# Patient Record
Sex: Female | Born: 1955 | Race: Black or African American | Hispanic: No | Smoking: Current every day smoker
Health system: Southern US, Community
[De-identification: ages and names within clinical notes are randomized; demographics above are authoritative.]

## PROBLEM LIST (undated history)

## (undated) DIAGNOSIS — I471 Supraventricular tachycardia, unspecified: Secondary | ICD-10-CM

## (undated) DIAGNOSIS — D649 Anemia, unspecified: Secondary | ICD-10-CM

## (undated) DIAGNOSIS — J9610 Chronic respiratory failure, unspecified whether with hypoxia or hypercapnia: Secondary | ICD-10-CM

## (undated) DIAGNOSIS — L0291 Cutaneous abscess, unspecified: Secondary | ICD-10-CM

## (undated) DIAGNOSIS — Z91199 Patient's noncompliance with other medical treatment and regimen due to unspecified reason: Secondary | ICD-10-CM

## (undated) DIAGNOSIS — I10 Essential (primary) hypertension: Secondary | ICD-10-CM

## (undated) DIAGNOSIS — N183 Chronic kidney disease, stage 3 unspecified: Secondary | ICD-10-CM

## (undated) DIAGNOSIS — F419 Anxiety disorder, unspecified: Secondary | ICD-10-CM

## (undated) DIAGNOSIS — E7800 Pure hypercholesterolemia, unspecified: Secondary | ICD-10-CM

## (undated) DIAGNOSIS — F32A Depression, unspecified: Secondary | ICD-10-CM

## (undated) DIAGNOSIS — K219 Gastro-esophageal reflux disease without esophagitis: Secondary | ICD-10-CM

## (undated) DIAGNOSIS — E119 Type 2 diabetes mellitus without complications: Secondary | ICD-10-CM

## (undated) DIAGNOSIS — F329 Major depressive disorder, single episode, unspecified: Secondary | ICD-10-CM

## (undated) DIAGNOSIS — Z9981 Dependence on supplemental oxygen: Secondary | ICD-10-CM

## (undated) DIAGNOSIS — Z9119 Patient's noncompliance with other medical treatment and regimen: Secondary | ICD-10-CM

---

## 2014-08-22 ENCOUNTER — Ambulatory Visit (HOSPITAL_COMMUNITY)
Admission: RE | Admit: 2014-08-22 | Discharge: 2014-08-22 | Disposition: A | Discharge status: Home / Self Care | Payer: BLUE CROSS/BLUE SHIELD | Source: Ambulatory Visit | Attending: Nurse Practitioner | Admitting: Nurse Practitioner

## 2014-08-22 ENCOUNTER — Other Ambulatory Visit (HOSPITAL_COMMUNITY): Payer: Self-pay | Admitting: Nurse Practitioner

## 2014-08-22 DIAGNOSIS — Z87891 Personal history of nicotine dependence: Secondary | ICD-10-CM | POA: Insufficient documentation

## 2014-08-22 DIAGNOSIS — R0602 Shortness of breath: Secondary | ICD-10-CM | POA: Insufficient documentation

## 2014-08-22 DIAGNOSIS — R062 Wheezing: Secondary | ICD-10-CM

## 2014-08-22 DIAGNOSIS — R05 Cough: Secondary | ICD-10-CM | POA: Insufficient documentation

## 2015-08-07 ENCOUNTER — Inpatient Hospital Stay (HOSPITAL_COMMUNITY): Payer: BLUE CROSS/BLUE SHIELD

## 2015-08-07 ENCOUNTER — Inpatient Hospital Stay (HOSPITAL_COMMUNITY)
Admission: EM | Admit: 2015-08-07 | Discharge: 2015-08-15 | DRG: 853 | Disposition: A | Discharge status: Skilled Nursing Facility | Payer: BLUE CROSS/BLUE SHIELD | Attending: Internal Medicine | Admitting: Internal Medicine

## 2015-08-07 ENCOUNTER — Emergency Department (HOSPITAL_COMMUNITY): Payer: BLUE CROSS/BLUE SHIELD

## 2015-08-07 ENCOUNTER — Encounter (HOSPITAL_COMMUNITY): Payer: Self-pay | Admitting: *Deleted

## 2015-08-07 DIAGNOSIS — I10 Essential (primary) hypertension: Secondary | ICD-10-CM | POA: Diagnosis not present

## 2015-08-07 DIAGNOSIS — E870 Hyperosmolality and hypernatremia: Secondary | ICD-10-CM | POA: Diagnosis not present

## 2015-08-07 DIAGNOSIS — M726 Necrotizing fasciitis: Secondary | ICD-10-CM

## 2015-08-07 DIAGNOSIS — D638 Anemia in other chronic diseases classified elsewhere: Secondary | ICD-10-CM | POA: Diagnosis present

## 2015-08-07 DIAGNOSIS — F17210 Nicotine dependence, cigarettes, uncomplicated: Secondary | ICD-10-CM | POA: Diagnosis not present

## 2015-08-07 DIAGNOSIS — E872 Acidosis: Secondary | ICD-10-CM | POA: Diagnosis not present

## 2015-08-07 DIAGNOSIS — J9621 Acute and chronic respiratory failure with hypoxia: Secondary | ICD-10-CM | POA: Diagnosis not present

## 2015-08-07 DIAGNOSIS — Z6841 Body Mass Index (BMI) 40.0 and over, adult: Secondary | ICD-10-CM

## 2015-08-07 DIAGNOSIS — A419 Sepsis, unspecified organism: Principal | ICD-10-CM | POA: Diagnosis present

## 2015-08-07 DIAGNOSIS — E876 Hypokalemia: Secondary | ICD-10-CM | POA: Diagnosis not present

## 2015-08-07 DIAGNOSIS — L02415 Cutaneous abscess of right lower limb: Secondary | ICD-10-CM | POA: Diagnosis present

## 2015-08-07 DIAGNOSIS — E785 Hyperlipidemia, unspecified: Secondary | ICD-10-CM | POA: Diagnosis present

## 2015-08-07 DIAGNOSIS — R34 Anuria and oliguria: Secondary | ICD-10-CM | POA: Diagnosis not present

## 2015-08-07 DIAGNOSIS — Z789 Other specified health status: Secondary | ICD-10-CM | POA: Diagnosis not present

## 2015-08-07 DIAGNOSIS — E119 Type 2 diabetes mellitus without complications: Secondary | ICD-10-CM

## 2015-08-07 DIAGNOSIS — J9602 Acute respiratory failure with hypercapnia: Secondary | ICD-10-CM | POA: Diagnosis not present

## 2015-08-07 DIAGNOSIS — I959 Hypotension, unspecified: Secondary | ICD-10-CM | POA: Diagnosis present

## 2015-08-07 DIAGNOSIS — R6520 Severe sepsis without septic shock: Secondary | ICD-10-CM | POA: Diagnosis present

## 2015-08-07 DIAGNOSIS — Z8679 Personal history of other diseases of the circulatory system: Secondary | ICD-10-CM | POA: Diagnosis not present

## 2015-08-07 DIAGNOSIS — J9601 Acute respiratory failure with hypoxia: Secondary | ICD-10-CM | POA: Diagnosis not present

## 2015-08-07 DIAGNOSIS — Z794 Long term (current) use of insulin: Secondary | ICD-10-CM | POA: Diagnosis not present

## 2015-08-07 DIAGNOSIS — N289 Disorder of kidney and ureter, unspecified: Secondary | ICD-10-CM

## 2015-08-07 DIAGNOSIS — J9600 Acute respiratory failure, unspecified whether with hypoxia or hypercapnia: Secondary | ICD-10-CM

## 2015-08-07 DIAGNOSIS — N179 Acute kidney failure, unspecified: Secondary | ICD-10-CM | POA: Diagnosis present

## 2015-08-07 DIAGNOSIS — Z01818 Encounter for other preprocedural examination: Secondary | ICD-10-CM

## 2015-08-07 DIAGNOSIS — Z7982 Long term (current) use of aspirin: Secondary | ICD-10-CM

## 2015-08-07 DIAGNOSIS — Z978 Presence of other specified devices: Secondary | ICD-10-CM

## 2015-08-07 DIAGNOSIS — IMO0001 Reserved for inherently not codable concepts without codable children: Secondary | ICD-10-CM

## 2015-08-07 DIAGNOSIS — J9622 Acute and chronic respiratory failure with hypercapnia: Secondary | ICD-10-CM | POA: Diagnosis not present

## 2015-08-07 HISTORY — DX: Essential (primary) hypertension: I10

## 2015-08-07 LAB — CBC WITH DIFFERENTIAL/PLATELET
BASOS ABS: 0 10*3/uL (ref 0.0–0.1)
Basophils Relative: 0 %
EOS ABS: 0 10*3/uL (ref 0.0–0.7)
EOS PCT: 0 %
HCT: 36.7 % (ref 36.0–46.0)
Hemoglobin: 12.9 g/dL (ref 12.0–15.0)
Lymphocytes Relative: 9 %
Lymphs Abs: 1.4 10*3/uL (ref 0.7–4.0)
MCH: 33.5 pg (ref 26.0–34.0)
MCHC: 35.1 g/dL (ref 30.0–36.0)
MCV: 95.3 fL (ref 78.0–100.0)
Monocytes Absolute: 0.8 10*3/uL (ref 0.1–1.0)
Monocytes Relative: 5 %
NEUTROS PCT: 86 %
Neutro Abs: 13.6 10*3/uL — ABNORMAL HIGH (ref 1.7–7.7)
PLATELETS: 246 10*3/uL (ref 150–400)
RBC: 3.85 MIL/uL — ABNORMAL LOW (ref 3.87–5.11)
RDW: 13.9 % (ref 11.5–15.5)
WBC: 15.8 10*3/uL — ABNORMAL HIGH (ref 4.0–10.5)

## 2015-08-07 LAB — BASIC METABOLIC PANEL
Anion gap: 15 (ref 5–15)
BUN: 39 mg/dL — AB (ref 6–20)
CO2: 28 mmol/L (ref 22–32)
CREATININE: 1.66 mg/dL — AB (ref 0.44–1.00)
Calcium: 9.4 mg/dL (ref 8.9–10.3)
Chloride: 91 mmol/L — ABNORMAL LOW (ref 101–111)
GFR calc Af Amer: 38 mL/min — ABNORMAL LOW (ref >60)
GFR, EST NON AFRICAN AMERICAN: 33 mL/min — AB (ref >60)
Glucose, Bld: 335 mg/dL — ABNORMAL HIGH (ref 65–99)
POTASSIUM: 3.2 mmol/L — AB (ref 3.5–5.1)
SODIUM: 134 mmol/L — AB (ref 135–145)

## 2015-08-07 MED ORDER — IOHEXOL 300 MG/ML  SOLN
80.0000 mL | Freq: Once | INTRAMUSCULAR | Status: AC | PRN
  Administered 2015-08-07: 80 mL via INTRAVENOUS

## 2015-08-07 MED ORDER — PIPERACILLIN-TAZOBACTAM 3.375 G IVPB 30 MIN
3.3750 g | Freq: Once | INTRAVENOUS | Status: AC
  Administered 2015-08-07: 3.375 g via INTRAVENOUS
  Filled 2015-08-07: qty 50

## 2015-08-07 MED ORDER — LIDOCAINE-EPINEPHRINE (PF) 1 %-1:200000 IJ SOLN
10.0000 mL | Freq: Once | INTRAMUSCULAR | Status: AC
  Administered 2015-08-07: 10 mL via INTRADERMAL
  Filled 2015-08-07: qty 10

## 2015-08-07 MED ORDER — SODIUM CHLORIDE 0.9 % IV BOLUS (SEPSIS)
1000.0000 mL | Freq: Once | INTRAVENOUS | Status: AC
  Administered 2015-08-07: 1000 mL via INTRAVENOUS

## 2015-08-07 MED ORDER — OXYCODONE-ACETAMINOPHEN 5-325 MG PO TABS
1.0000 | ORAL_TABLET | Freq: Once | ORAL | Status: AC
  Administered 2015-08-07: 1 via ORAL
  Filled 2015-08-07: qty 1

## 2015-08-07 MED ORDER — CLINDAMYCIN PHOSPHATE 600 MG/50ML IV SOLN
600.0000 mg | Freq: Once | INTRAVENOUS | Status: AC
  Administered 2015-08-07: 600 mg via INTRAVENOUS
  Filled 2015-08-07: qty 50

## 2015-08-07 MED ORDER — VANCOMYCIN HCL IN DEXTROSE 1-5 GM/200ML-% IV SOLN
1000.0000 mg | Freq: Once | INTRAVENOUS | Status: AC
  Administered 2015-08-07: 1000 mg via INTRAVENOUS
  Filled 2015-08-07: qty 200

## 2015-08-07 NOTE — ED Notes (Addendum)
Pt states she began having an abscess last Tuesday, she talked with her PCP on Friday and he wrote a prescription of antibiotics.   Pt has large amount of swelling to her right inner thigh.

## 2015-08-07 NOTE — H&P (Signed)
[facility] History and Physical  [name] ZOX:[id number] DOB: 03-Dec-1955 DOA: 08/07/2015  Referring physician: Dr Clayborne Dana PCP: [name], MD   Chief Complaint: Swelling and pain inner thigh no R  HPI: [name] is a 60 y.o. female with 20 yr hx of IDDM, HTN, depression, obesity who presented to ED today with 6 day hx of nausea, fevers and lightheadedness.  She has hx of "boils" in and around her private area in the past.  Now she has a lot of swelling and pain about the inner thigh on the R.  She has had temps up to 102 most every day x 6 days, not able to eat much, lightheaded w standing.  No abd pain, CP, no diarrhea.     In the ED WBC 15.8, temp 98.9, BP 99/45 and SaO2 95% on 2L [place].  Patient found to have large amount swelling R upper / medial thigh.  Pelvis CT w contrast showed "aggressive infection/ cellulitis involving R medial upper thigh and groin area w extensive gas in the SQ fat....likely necrotizing fasciitis. "  Plan is for transfer to [facility].  Seen by gen surgery here but this patient is beyond the scope of their practice here.   Lives with family, raising grandchildren.  Smokes 2 cigarettes per day, no etoh.  No hx heart problems, MI, afib, no hx CVA or diabetic complications (no neurop/ retinop).    ROS  denies CP  no joint pain   no HA  no blurry vision  no rash  no diarrhea  no dysuria  no difficulty voiding  no change in urine color    Where does patient live home Can patient participate in ADLs? yes  Past Medical History  Past Medical History  Diagnosis Date  . Diabetes mellitus without complication (HCC)   . Hypertension    Past Surgical History History reviewed. No pertinent past surgical history. Family History No family history on file. Social History  reports that she has been smoking Cigarettes.  She does not have any smokeless tobacco history on file. She reports that she drinks alcohol. She reports that she does not use  illicit drugs. Allergies  Allergies  Allergen Reactions  . Penicillins Rash    Has patient had a PCN reaction causing immediate rash, facial/tongue/throat swelling, SOB or lightheadedness with hypotension: Yes Has patient had a PCN reaction causing severe rash involving mucus membranes or skin necrosis: No Has patient had a PCN reaction that required hospitalization No Has patient had a PCN reaction occurring within the last 10 years: No If all of the above answers are "NO", then may proceed with Cephalosporin use.    Home medications Prior to Admission medications   Medication Sig Start Date End Date Taking? Authorizing Provider  acetaminophen (TYLENOL) 500 MG tablet Take 500 mg by mouth every 6 (six) hours as needed for mild pain or moderate pain.   Yes Historical Provider, MD  aspirin EC 81 MG tablet Take 81 mg by mouth daily.   Yes Historical Provider, MD  doxycycline (VIBRA-TABS) 100 MG tablet Take 100 mg by mouth 2 (two) times daily. 10 day course starting on 08/04/2015 08/04/15  Yes Historical Provider, MD  FARXIGA 10 MG TABS tablet Take 10 mg by mouth daily. 07/23/15  Yes Historical Provider, MD  FLUoxetine (PROZAC) 20 MG capsule Take 20 mg by mouth 2 (two) times daily.  05/11/15  Yes Historical Provider, MD  lisinopril-hydrochlorothiazide (PRINZIDE,ZESTORETIC) 20-25 MG tablet Take 1 tablet by  mouth daily. 05/11/15  Yes Historical Provider, MD  metFORMIN (GLUCOPHAGE) 1000 MG tablet Take 1,000 mg by mouth 2 (two) times daily. 07/19/15  Yes Historical Provider, MD  metoprolol (LOPRESSOR) 50 MG tablet Take 50 mg by mouth 2 (two) times daily. 05/11/15  Yes Historical Provider, MD  NIFEdipine (PROCARDIA-XL/ADALAT CC) 30 MG 24 hr tablet Take 30 mg by mouth daily. 05/11/15  Yes Historical Provider, MD  NOVOLIN 70/30 RELION (70-30) 100 UNIT/ML injection Inject 30 Units into the skin every morning.  07/23/15  Yes Historical Provider, MD  pravastatin (PRAVACHOL) 40 MG tablet Take 40 mg by mouth daily.  05/11/15  Yes Historical Provider, MD   Liver Function Tests No results for input(s): AST, ALT, ALKPHOS, BILITOT, PROT, ALBUMIN in the last 168 hours. No results for input(s): LIPASE, AMYLASE in the last 168 hours. CBC  Recent Labs Lab 08/07/15 1247  WBC 15.8*  NEUTROABS 13.6*  HGB 12.9  HCT 36.7  MCV 95.3  PLT 246   Basic Metabolic Panel  Recent Labs Lab 08/07/15 1247  NA 134*  K 3.2*  CL 91*  CO2 28  GLUCOSE 335*  BUN 39*  CREATININE 1.66*  CALCIUM 9.4     Filed Vitals:   08/07/15 1800 08/07/15 1815 08/07/15 1830 08/07/15 2000  BP:   99/45 108/43  Pulse: 101 97 97 99  Temp:   98.9 F (37.2 C)   TempSrc:   Oral   Resp: Height:      Weight:      SpO2: 85% 94% 93% 95%   Exam: Alert, pleasant adult female, no distress No rash, cyanosis or gangrene Sclera anicteric, throat clear No jvd Chest clear bilat RRR no MRG Abd obese ntnd no mass or ascite no hsm +BS GU medial R thigh very swollen, skin is erythematous, very tender, draining and very foul odor No LE edema Neuro is alert, nf, Ox 3  EKG (independently reviewed) > pending CXR (independently reviewed) > pending  Home medications > asa, doxy, Farxiga, lisinopril/ HCTZ, metformin, metoprolol, nifedipine, 70/30 insulin 15 u bid, pravastatin  Assessment: 1. Necrotizing fasciitis R thigh- extensive gas in SQ fat by CT, no drainable abscess.  Getting IV abx x 3 here (clinda/vanc/ zosyn) then plan transfer to [facility] for further evaluation.  Get EKG/ CXR preop, starting IVF's.  Hold all po meds and BP meds, BP is soft.   2. HTN by history  3. Hypotension - prob d/t infection  4. DM2 - will use lantus 10 bid and q 4hr SSI for now 5. Acute renal insuff - no old labs here, may be from hypoperfusion.  Starting IVF's.    Plan - as above   DVT Prophylaxis SCD's  Code Status: full  Family Communication: none here  Disposition Plan: transfer to [facility] Pager [phone]  Cell [phone]  If 7PM-7AM, please contact night-coverage [email or web address] Password [facility] 08/07/2015, 8:51 PM

## 2015-08-07 NOTE — Consult Note (Signed)
Patient seen, chart reviewed. Patient is a 60 year old black female with history of diabetes mellitus, glucose 335 at presentation, and morbid obesity who presents with worsening right groin pain and swelling. This is in the right upper inner thigh. Patient does have a leukocytosis and her blood pressure is low but stable. CT scan reveals findings consistent with necrotizing fasciitis. There is no abscess drainable. This patient needs to be transferred to a tertiary care center for further evaluation and treatment as this is outside our realm of expertise and care. Patient understands this and agrees to the transfer.

## 2015-08-07 NOTE — ED Provider Notes (Signed)
CSN: [id number]     Arrival date & time 08/07/15  1207 History   First MD Initiated Contact with Patient 08/07/15 1557     Chief Complaint  Patient presents with  . Abscess     (Consider location/radiation/quality/duration/timing/severity/associated sxs/prior Treatment) Patient is a 60 y.o. female presenting with leg pain.  Leg Pain Injury: no   Pain details:    Quality:  Aching and sharp   Radiates to:  Does not radiate   Severity:  Mild   Onset quality:  Gradual   Duration:  1 week   Timing:  Constant   Progression:  Worsening (progressive worsening on antibiotics, last 24-36 hours it has severely worsened and become more painful and swollen) Chronicity:  New Prior injury to area:  No Ineffective treatments: antibiotics, NSAIDs. Associated symptoms: fatigue and fever     60 year old obese hypertensive diabetic female here who had a week of cellulitis in her thigh however last 24 hours has gotten slightly more painful and swollen.  Past Medical History  Diagnosis Date  . Diabetes mellitus without complication (HCC)   . Hypertension    History reviewed. No pertinent past surgical history. No family history on file. Social History  Substance Use Topics  . Smoking status: Current Some Day Smoker    Types: Cigarettes  . Smokeless tobacco: None  . Alcohol Use: Yes     Comment: occ.   OB History    No data available     Review of Systems  Constitutional: Positive for fever, activity change and fatigue.  Gastrointestinal: Positive for nausea and vomiting.  Skin: Positive for rash.  All other systems reviewed and are negative.     Allergies  Review of patient's allergies indicates no known allergies.  Home Medications   Prior to Admission medications   Not on File   BP 110/54 mmHg  Pulse 104  Temp(Src) 98.8 F (37.1 C) (Oral)  Resp 18  Ht 5\' 3"  (1.6 m)  Wt 240 lb (108.863 kg)  BMI 42.52 kg/m2  SpO2 97% Physical Exam  Constitutional: She appears  well-developed and well-nourished.  HENT:  Head: Normocephalic and atraumatic.  Neck: Normal range of motion.  Cardiovascular: Normal rate and regular rhythm.   Pulmonary/Chest: No stridor. No respiratory distress.  Abdominal: She exhibits no distension.  Neurological: She is alert.  Skin: Skin is warm and dry. Rash (erythematous, slightly indurated, tender with some associated crepitus) noted.  Nursing note and vitals reviewed.   ED Course  .Marland KitchenIncision and Drainage Date/Time: 08/09/2015 11:34 AM Performed by: Marily Memos Authorized by: Marily Memos Consent: Verbal consent obtained. Risks and benefits: risks, benefits and alternatives were discussed Consent given by: patient Patient understanding: patient states understanding of the procedure being performed Patient consent: the patient's understanding of the procedure matches consent given Patient identity confirmed: verbally with patient Time out: Immediately prior to procedure a "time out" was called to verify the correct patient, procedure, equipment, support staff and site/side marked as required. Type: abscess Body area: lower extremity (right thigh) Anesthesia: local infiltration Local anesthetic: lidocaine 1% with epinephrine Anesthetic total: 5 ml Risk factor: underlying major vessel and  underlying major nerve Scalpel size: 11 Needle gauge: 18 Incision type: single straight Incision depth: dermal Complexity: simple Drainage: serosanguinous Drainage amount: scant Patient tolerance: Patient tolerated the procedure well with no immediate complications Comments: Initially aspirated with needle and small amount of purulent appearing fluid was retrieved so proceeded to I&D with scalpel. Minimal fluid retrieved, but very malodrous,  grayish yellowish appearance. Cultures taken.    CRITICAL CARE Performed by: Marily Memos   Total critical care time: 45 minutes Critical care time was exclusive of separately billable  procedures and treating other patients. Critical care was necessary to treat or prevent imminent or life-threatening deterioration. Critical care was time spent personally by me on the following activities: development of treatment plan with patient and/or surrogate as well as nursing, discussions with consultants, evaluation of patient's response to treatment, examination of patient, obtaining history from patient or surrogate, ordering and performing treatments and interventions, ordering and review of laboratory studies, ordering and review of radiographic studies, pulse oximetry and re-evaluation of patient's condition.  EMERGENCY DEPARTMENT US SOFT TISSUE INTERPRETATION "Study: Limited Soft Tissue Ultrasound"  INDICATIONS: Pain and Soft tissue infection Multiple views of the body part were obtained in real-time with a multi-frequency linear probe PERFORMED BY:  Myself IMAGES ARCHIVED?: Yes SIDE:Right  BODY PART:Lower extremity FINDINGS: Other small fluid collections with cellulitis INTERPRETATION:  Possible abscess with significant loculations  CPT: Lower extremity 76880-26  Labs Review Labs Reviewed  CBC WITH DIFFERENTIAL/PLATELET - Abnormal; Notable for the following:    WBC 15.8 (*)    RBC 3.85 (*)    Neutro Abs 13.6 (*)    All other components within normal limits  BASIC METABOLIC PANEL - Abnormal; Notable for the following:    Sodium 134 (*)    Potassium 3.2 (*)    Chloride 91 (*)    Glucose, Bld 335 (*)    BUN 39 (*)    Creatinine, Ser 1.66 (*)    GFR calc non Af Amer 33 (*)    GFR calc Af Amer 38 (*)    All other components within normal limits  URINALYSIS, ROUTINE W REFLEX MICROSCOPIC (NOT AT [facility]) - Abnormal; Notable for the following:    Glucose, UA >1000 (*)    Hgb urine dipstick TRACE (*)    Ketones, ur 15 (*)    All other components within normal limits  HEPATIC FUNCTION PANEL - Abnormal; Notable for the following:    Albumin 2.2 (*)    ALT 12 (*)    All  other components within normal limits  COMPREHENSIVE METABOLIC PANEL - Abnormal; Notable for the following:    Potassium 2.9 (*)    Chloride 96 (*)    Glucose, Bld 288 (*)    BUN 42 (*)    Creatinine, Ser 1.71 (*)    Albumin 2.1 (*)    AST 14 (*)    ALT 12 (*)    GFR calc non Af Amer 31 (*)    GFR calc Af Amer 36 (*)    Anion gap 17 (*)    All other components within normal limits  CBC - Abnormal; Notable for the following:    WBC 15.5 (*)    RBC 3.40 (*)    Hemoglobin 11.2 (*)    HCT 32.2 (*)    All other components within normal limits  GLUCOSE, CAPILLARY - Abnormal; Notable for the following:    Glucose-Capillary 282 (*)    All other components within normal limits  GLUCOSE, CAPILLARY - Abnormal; Notable for the following:    Glucose-Capillary 263 (*)    All other components within normal limits  URINE MICROSCOPIC-ADD ON - Abnormal; Notable for the following:    Squamous Epithelial / LPF 6-30 (*)    Bacteria, UA FEW (*)    All other components within normal limits  GLUCOSE, CAPILLARY -  Abnormal; Notable for the following:    Glucose-Capillary 211 (*)    All other components within normal limits  CBC - Abnormal; Notable for the following:    WBC 13.3 (*)    RBC 3.38 (*)    Hemoglobin 11.2 (*)    HCT 31.9 (*)    All other components within normal limits  BASIC METABOLIC PANEL - Abnormal; Notable for the following:    Potassium 2.8 (*)    Chloride 99 (*)    Glucose, Bld 195 (*)    BUN 42 (*)    Creatinine, Ser 1.57 (*)    GFR calc non Af Amer 35 (*)    GFR calc Af Amer 40 (*)    All other components within normal limits  MAGNESIUM - Abnormal; Notable for the following:    Magnesium 2.5 (*)    All other components within normal limits  PROTIME-INR - Abnormal; Notable for the following:    Prothrombin Time 15.8 (*)    All other components within normal limits  HEMOGLOBIN A1C - Abnormal; Notable for the following:    Hgb A1c MFr Bld 9.8 (*)    All other components  within normal limits  LIPID PANEL - Abnormal; Notable for the following:    Triglycerides 210 (*)    HDL 11 (*)    VLDL 42 (*)    All other components within normal limits  BASIC METABOLIC PANEL - Abnormal; Notable for the following:    Potassium 3.1 (*)    Glucose, Bld 174 (*)    BUN 40 (*)    Creatinine, Ser 1.41 (*)    Calcium 8.5 (*)    GFR calc non Af Amer 40 (*)    GFR calc Af Amer 46 (*)    All other components within normal limits  GLUCOSE, CAPILLARY - Abnormal; Notable for the following:    Glucose-Capillary 162 (*)    All other components within normal limits  GLUCOSE, CAPILLARY - Abnormal; Notable for the following:    Glucose-Capillary 167 (*)    All other components within normal limits  TRIGLYCERIDES - Abnormal; Notable for the following:    Triglycerides 232 (*)    All other components within normal limits  BASIC METABOLIC PANEL - Abnormal; Notable for the following:    Potassium 3.3 (*)    Chloride 100 (*)    Glucose, Bld 219 (*)    BUN 42 (*)    Creatinine, Ser 1.54 (*)    Calcium 8.5 (*)    GFR calc non Af Amer 36 (*)    GFR calc Af Amer 41 (*)    Anion gap 16 (*)    All other components within normal limits  CBC - Abnormal; Notable for the following:    WBC 13.0 (*)    RBC 3.05 (*)    Hemoglobin 9.6 (*)    HCT 28.8 (*)    All other components within normal limits  BASIC METABOLIC PANEL - Abnormal; Notable for the following:    Potassium 3.2 (*)    Glucose, Bld 114 (*)    BUN 47 (*)    Creatinine, Ser 1.56 (*)    Calcium 8.3 (*)    GFR calc non Af Amer 35 (*)    GFR calc Af Amer 41 (*)    All other components within normal limits  BLOOD GAS, ARTERIAL - Abnormal; Notable for the following:    pH, Arterial 7.299 (*)    pCO2 arterial 55.5 (*)  pO2, Arterial 111 (*)    Bicarbonate 26.4 (*)    All other components within normal limits  GLUCOSE, CAPILLARY - Abnormal; Notable for the following:    Glucose-Capillary 194 (*)    All other components  within normal limits  GLUCOSE, CAPILLARY - Abnormal; Notable for the following:    Glucose-Capillary 119 (*)    All other components within normal limits  GLUCOSE, CAPILLARY - Abnormal; Notable for the following:    Glucose-Capillary 102 (*)    All other components within normal limits  TRIGLYCERIDES - Abnormal; Notable for the following:    Triglycerides 351 (*)    All other components within normal limits  GLUCOSE, CAPILLARY - Abnormal; Notable for the following:    Glucose-Capillary 108 (*)    All other components within normal limits  POCT I-STAT 4, (NA,K, GLUC, HGB,HCT) - Abnormal; Notable for the following:    Potassium 2.6 (*)    Glucose, Bld 176 (*)    HCT 34.0 (*)    Hemoglobin 11.6 (*)    All other components within normal limits  POCT I-STAT 3, ART BLOOD GAS (G3+) - Abnormal; Notable for the following:    pH, Arterial 7.291 (*)    pCO2 arterial 56.0 (*)    pO2, Arterial 66.0 (*)    Bicarbonate 27.2 (*)    All other components within normal limits  CULTURE, ROUTINE-ABSCESS  MRSA PCR SCREENING  CULTURE, ROUTINE-ABSCESS  ANAEROBIC CULTURE  MAGNESIUM  APTT  PROCALCITONIN    Imaging Review Ct Pelvis W Contrast  08/07/2015  CLINICAL DATA:  Pain, swelling and redness involving the right thigh and groin area. EXAM: CT PELVIS WITH CONTRAST TECHNIQUE: Multidetector CT imaging of the pelvis was performed using the standard protocol following the bolus administration of intravenous contrast. CONTRAST:  80mL OMNIPAQUE IOHEXOL 300 MG/ML  SOLN COMPARISON:  None. FINDINGS: There is a fairly extensive infectious process involving the right medial upper thigh and groin area extending along anterior aspect of the right pelvis. There is an extensive amount of gas in the soft tissues, inflammation/edema/fluid is skin thickening. Findings are worrisome for necrotizing fasciitis-cellulitis. I do not see a discrete drainable abscess. There is marked inflammation involving the deep muscular  fascia but I do not see any obvious myofasciitis or pyomyositis. No findings for septic arthritis or osteomyelitis. No significant intra pelvic abnormalities. Uterine fibroids are noted. Bladder is normal. There are borderline enlarged/ inflamed inguinal lymph nodes, right greater than left. Moderate atherosclerotic calcifications involving the aorta, iliac and common femoral arteries. No findings for deep venous thrombosis in the upper thighs were groin. There is an anterior abdominal wall hernia containing fat and part of the transverse colon. IMPRESSION: Aggressive appearing infection/cellulitis involving right medial upper thigh and groin area with extensive gas in the subcutaneous fat. This is likely necrotizing fasciitis/cellulitis. No discrete drainable abscess or findings for deep compartment involvement. No findings for septic arthritis or osteomyelitis. Electronically Signed   By: Rudie Meyer M.D.   On: 08/07/2015 17:56   Dg Chest Port 1 View  08/08/2015  CLINICAL DATA:  Acute respiratory failure. Endotracheal tube placement. EXAM: PORTABLE CHEST 1 VIEW COMPARISON:  08/06/2010 FINDINGS: A new endotracheal tube is seen with tip approximately 2 cm above the carina. A new nasogastric tube is seen with tip in the stomach. Improved aeration of both lungs is demonstrated. There is no evidence of pulmonary consolidation, pleural effusion, or pneumothorax. IMPRESSION: Endotracheal tube and nasogastric tube in appropriate position. Improved aeration of both  lungs. No evidence of consolidation or effusion. Electronically Signed   By: Myles Rosenthal M.D.   On: 08/08/2015 14:31   Dg Chest Port 1 View  08/07/2015  CLINICAL DATA:  60 year old female with abscess on the leg. Preop evaluation EXAM: PORTABLE CHEST 1 VIEW COMPARISON:  Radiograph dated 08/22/2014 FINDINGS: Single portable view of the chest demonstrate hypovolemic lungs. There is no focal consolidation, pleural effusion, or pneumothorax. Top-normal  cardiac silhouette. No acute osseous pathology. IMPRESSION: No acute cardiopulmonary process. Electronically Signed   By: Elgie Collard M.D.   On: 08/07/2015 21:27   I have personally reviewed and evaluated these images and lab results as part of my medical decision-making.   EKG Interpretation None      MDM   Final diagnoses:  Necrotizing fasciitis (HCC) of inner thigh  Acute renal insufficiency   Attempted I&D, very foul odor and no purulent drainage. Swabs taken. Concern for nec fasc. Started vanc/zosyn/clinda, CT ordered. Will speak with general surgeon. Spoke with Dr. Lovell Sheehan who feels she has the potential to be very sick and doesn't feel that [facility] is the appropriate place or have the appropriate personnel (i.e. Critical care, multiple surgeons) to take care of her. Patients' VS stable, still slightly tachy, MS ok.  Paged Dr. Lindie Spruce at [facility] who felt that Dr. Lovell Sheehan should evaluate the patient before refusing to let her stay here, repaged Dr. Lovell Sheehan.  Dr. Lovell Sheehan evaluated patient and still feels that she should be transferred. Patient still stable, BP slightly lower, another bolus ordered.  Repaged Dr. Lindie Spruce at [facility] who now agrees patient can be [facility] service. Medicine paged.  [facility] evaluated promptly and agreed with admission and transfer. Still stable.      Marily Memos, MD 08/09/15 1140

## 2015-08-08 ENCOUNTER — Encounter (HOSPITAL_COMMUNITY)
Admission: EM | Disposition: A | Discharge status: Skilled Nursing Facility | Payer: Self-pay | Source: Home / Self Care | Attending: Internal Medicine

## 2015-08-08 ENCOUNTER — Inpatient Hospital Stay (HOSPITAL_COMMUNITY): Payer: BLUE CROSS/BLUE SHIELD

## 2015-08-08 ENCOUNTER — Inpatient Hospital Stay (HOSPITAL_COMMUNITY): Payer: BLUE CROSS/BLUE SHIELD | Admitting: Anesthesiology

## 2015-08-08 DIAGNOSIS — Z978 Presence of other specified devices: Secondary | ICD-10-CM | POA: Insufficient documentation

## 2015-08-08 DIAGNOSIS — M726 Necrotizing fasciitis: Secondary | ICD-10-CM

## 2015-08-08 DIAGNOSIS — N289 Disorder of kidney and ureter, unspecified: Secondary | ICD-10-CM

## 2015-08-08 DIAGNOSIS — Z789 Other specified health status: Secondary | ICD-10-CM

## 2015-08-08 HISTORY — PX: INCISION AND DRAINAGE ABSCESS: SHX5864

## 2015-08-08 LAB — GLUCOSE, CAPILLARY
GLUCOSE-CAPILLARY: 211 mg/dL — AB (ref 65–99)
GLUCOSE-CAPILLARY: 162 mg/dL — AB (ref 65–99)
GLUCOSE-CAPILLARY: 194 mg/dL — AB (ref 65–99)
Glucose-Capillary: 167 mg/dL — ABNORMAL HIGH (ref 65–99)
Glucose-Capillary: 263 mg/dL — ABNORMAL HIGH (ref 65–99)
Glucose-Capillary: 282 mg/dL — ABNORMAL HIGH (ref 65–99)

## 2015-08-08 LAB — URINALYSIS, ROUTINE W REFLEX MICROSCOPIC
Bilirubin Urine: NEGATIVE
KETONES UR: 15 mg/dL — AB
LEUKOCYTES UA: NEGATIVE
Nitrite: NEGATIVE
PROTEIN: NEGATIVE mg/dL
Specific Gravity, Urine: 1.03 (ref 1.005–1.030)
pH: 5 (ref 5.0–8.0)

## 2015-08-08 LAB — COMPREHENSIVE METABOLIC PANEL
ALT: 12 U/L — ABNORMAL LOW (ref 14–54)
AST: 14 U/L — AB (ref 15–41)
Albumin: 2.1 g/dL — ABNORMAL LOW (ref 3.5–5.0)
Alkaline Phosphatase: 106 U/L (ref 38–126)
Anion gap: 17 — ABNORMAL HIGH (ref 5–15)
BUN: 42 mg/dL — AB (ref 6–20)
CHLORIDE: 96 mmol/L — AB (ref 101–111)
CO2: 26 mmol/L (ref 22–32)
Calcium: 9.1 mg/dL (ref 8.9–10.3)
Creatinine, Ser: 1.71 mg/dL — ABNORMAL HIGH (ref 0.44–1.00)
GFR calc Af Amer: 36 mL/min — ABNORMAL LOW (ref >60)
GFR, EST NON AFRICAN AMERICAN: 31 mL/min — AB (ref >60)
Glucose, Bld: 288 mg/dL — ABNORMAL HIGH (ref 65–99)
POTASSIUM: 2.9 mmol/L — AB (ref 3.5–5.1)
Sodium: 139 mmol/L (ref 135–145)
Total Bilirubin: 0.9 mg/dL (ref 0.3–1.2)
Total Protein: 6.6 g/dL (ref 6.5–8.1)

## 2015-08-08 LAB — BASIC METABOLIC PANEL
ANION GAP: 13 (ref 5–15)
ANION GAP: 16 — AB (ref 5–15)
Anion gap: 13 (ref 5–15)
BUN: 42 mg/dL — ABNORMAL HIGH (ref 6–20)
BUN: 40 mg/dL — AB (ref 6–20)
BUN: 42 mg/dL — ABNORMAL HIGH (ref 6–20)
CALCIUM: 9 mg/dL (ref 8.9–10.3)
CALCIUM: 8.5 mg/dL — AB (ref 8.9–10.3)
CO2: 28 mmol/L (ref 22–32)
CO2: 26 mmol/L (ref 22–32)
CO2: 25 mmol/L (ref 22–32)
CREATININE: 1.57 mg/dL — AB (ref 0.44–1.00)
Calcium: 8.5 mg/dL — ABNORMAL LOW (ref 8.9–10.3)
Chloride: 99 mmol/L — ABNORMAL LOW (ref 101–111)
Chloride: 102 mmol/L (ref 101–111)
Chloride: 100 mmol/L — ABNORMAL LOW (ref 101–111)
Creatinine, Ser: 1.41 mg/dL — ABNORMAL HIGH (ref 0.44–1.00)
Creatinine, Ser: 1.54 mg/dL — ABNORMAL HIGH (ref 0.44–1.00)
GFR calc Af Amer: 46 mL/min — ABNORMAL LOW (ref >60)
GFR, EST AFRICAN AMERICAN: 40 mL/min — AB (ref >60)
GFR, EST AFRICAN AMERICAN: 41 mL/min — AB (ref >60)
GFR, EST NON AFRICAN AMERICAN: 35 mL/min — AB (ref >60)
GFR, EST NON AFRICAN AMERICAN: 40 mL/min — AB (ref >60)
GFR, EST NON AFRICAN AMERICAN: 36 mL/min — AB (ref >60)
GLUCOSE: 219 mg/dL — AB (ref 65–99)
Glucose, Bld: 195 mg/dL — ABNORMAL HIGH (ref 65–99)
Glucose, Bld: 174 mg/dL — ABNORMAL HIGH (ref 65–99)
POTASSIUM: 3.1 mmol/L — AB (ref 3.5–5.1)
POTASSIUM: 3.3 mmol/L — AB (ref 3.5–5.1)
Potassium: 2.8 mmol/L — ABNORMAL LOW (ref 3.5–5.1)
SODIUM: 140 mmol/L (ref 135–145)
SODIUM: 141 mmol/L (ref 135–145)
Sodium: 141 mmol/L (ref 135–145)

## 2015-08-08 LAB — CBC
HCT: 31.9 % — ABNORMAL LOW (ref 36.0–46.0)
HEMATOCRIT: 32.2 % — AB (ref 36.0–46.0)
Hemoglobin: 11.2 g/dL — ABNORMAL LOW (ref 12.0–15.0)
Hemoglobin: 11.2 g/dL — ABNORMAL LOW (ref 12.0–15.0)
MCH: 32.9 pg (ref 26.0–34.0)
MCH: 33.1 pg (ref 26.0–34.0)
MCHC: 34.8 g/dL (ref 30.0–36.0)
MCHC: 35.1 g/dL (ref 30.0–36.0)
MCV: 94.7 fL (ref 78.0–100.0)
MCV: 94.4 fL (ref 78.0–100.0)
PLATELETS: 244 10*3/uL (ref 150–400)
Platelets: 242 10*3/uL (ref 150–400)
RBC: 3.4 MIL/uL — AB (ref 3.87–5.11)
RBC: 3.38 MIL/uL — ABNORMAL LOW (ref 3.87–5.11)
RDW: 14.2 % (ref 11.5–15.5)
RDW: 14.1 % (ref 11.5–15.5)
WBC: 15.5 10*3/uL — AB (ref 4.0–10.5)
WBC: 13.3 10*3/uL — ABNORMAL HIGH (ref 4.0–10.5)

## 2015-08-08 LAB — LIPID PANEL
Cholesterol: 127 mg/dL (ref 0–200)
HDL: 11 mg/dL — ABNORMAL LOW (ref >40)
LDL CALC: 74 mg/dL (ref 0–99)
TRIGLYCERIDES: 210 mg/dL — AB (ref <150)
Total CHOL/HDL Ratio: 11.5 RATIO
VLDL: 42 mg/dL — AB (ref 0–40)

## 2015-08-08 LAB — PROTIME-INR
INR: 1.24 (ref 0.00–1.49)
PROTHROMBIN TIME: 15.8 s — AB (ref 11.6–15.2)

## 2015-08-08 LAB — HEPATIC FUNCTION PANEL
ALK PHOS: 126 U/L (ref 38–126)
ALT: 12 U/L — ABNORMAL LOW (ref 14–54)
AST: 15 U/L (ref 15–41)
Albumin: 2.2 g/dL — ABNORMAL LOW (ref 3.5–5.0)
BILIRUBIN INDIRECT: 0.5 mg/dL (ref 0.3–0.9)
Bilirubin, Direct: 0.2 mg/dL (ref 0.1–0.5)
TOTAL PROTEIN: 6.6 g/dL (ref 6.5–8.1)
Total Bilirubin: 0.7 mg/dL (ref 0.3–1.2)

## 2015-08-08 LAB — POCT I-STAT 3, ART BLOOD GAS (G3+)
BICARBONATE: 27.2 meq/L — AB (ref 20.0–24.0)
O2 Saturation: 91 %
PCO2 ART: 56 mmHg — AB (ref 35.0–45.0)
PH ART: 7.291 — AB (ref 7.350–7.450)
PO2 ART: 66 mmHg — AB (ref 80.0–100.0)
Patient temperature: 96.9
TCO2: 29 mmol/L (ref 0–100)

## 2015-08-08 LAB — POCT I-STAT 4, (NA,K, GLUC, HGB,HCT)
Glucose, Bld: 176 mg/dL — ABNORMAL HIGH (ref 65–99)
HEMATOCRIT: 34 % — AB (ref 36.0–46.0)
HEMOGLOBIN: 11.6 g/dL — AB (ref 12.0–15.0)
POTASSIUM: 2.6 mmol/L — AB (ref 3.5–5.1)
Sodium: 140 mmol/L (ref 135–145)

## 2015-08-08 LAB — TRIGLYCERIDES: TRIGLYCERIDES: 232 mg/dL — AB (ref <150)

## 2015-08-08 LAB — MAGNESIUM
MAGNESIUM: 2.4 mg/dL (ref 1.7–2.4)
MAGNESIUM: 2.5 mg/dL — AB (ref 1.7–2.4)

## 2015-08-08 LAB — APTT: aPTT: 30 seconds (ref 24–37)

## 2015-08-08 LAB — URINE MICROSCOPIC-ADD ON

## 2015-08-08 LAB — MRSA PCR SCREENING: MRSA BY PCR: NEGATIVE

## 2015-08-08 SURGERY — INCISION AND DRAINAGE, ABSCESS
Anesthesia: General | Laterality: Right

## 2015-08-08 MED ORDER — FENTANYL CITRATE (PF) 2500 MCG/50ML IJ SOLN
25.0000 ug/h | INTRAMUSCULAR | Status: DC
  Administered 2015-08-08 – 2015-08-10 (×4): 200 ug/h via INTRAVENOUS
  Filled 2015-08-08 (×4): qty 50

## 2015-08-08 MED ORDER — ANTISEPTIC ORAL RINSE SOLUTION (CORINZ)
7.0000 mL | Freq: Four times a day (QID) | OROMUCOSAL | Status: DC
  Administered 2015-08-09 – 2015-08-11 (×9): 7 mL via OROMUCOSAL

## 2015-08-08 MED ORDER — ONDANSETRON HCL 4 MG/2ML IJ SOLN
4.0000 mg | Freq: Four times a day (QID) | INTRAMUSCULAR | Status: DC | PRN
  Administered 2015-08-13 – 2015-08-14 (×3): 4 mg via INTRAVENOUS
  Filled 2015-08-08 (×3): qty 2

## 2015-08-08 MED ORDER — PROPOFOL 10 MG/ML IV BOLUS
INTRAVENOUS | Status: DC | PRN
  Administered 2015-08-08: 120 mg via INTRAVENOUS

## 2015-08-08 MED ORDER — PIPERACILLIN-TAZOBACTAM 3.375 G IVPB 30 MIN
3.3750 g | Freq: Once | INTRAVENOUS | Status: AC
  Administered 2015-08-08: 3.375 g via INTRAVENOUS
  Filled 2015-08-08: qty 50

## 2015-08-08 MED ORDER — SODIUM CHLORIDE 0.45 % IV SOLN
INTRAVENOUS | Status: DC
  Administered 2015-08-08: 01:00:00 via INTRAVENOUS

## 2015-08-08 MED ORDER — FENTANYL CITRATE (PF) 100 MCG/2ML IJ SOLN
INTRAMUSCULAR | Status: DC | PRN
  Administered 2015-08-08 (×2): 50 ug via INTRAVENOUS
  Administered 2015-08-08: 100 ug via INTRAVENOUS
  Administered 2015-08-08: 50 ug via INTRAVENOUS

## 2015-08-08 MED ORDER — FENTANYL CITRATE (PF) 100 MCG/2ML IJ SOLN: 50.0000 ug | Freq: Once | INTRAMUSCULAR | Status: DC

## 2015-08-08 MED ORDER — PHENYLEPHRINE HCL 10 MG/ML IJ SOLN
INTRAMUSCULAR | Status: DC | PRN
  Administered 2015-08-08 (×5): 80 ug via INTRAVENOUS

## 2015-08-08 MED ORDER — CHLORHEXIDINE GLUCONATE 0.12% ORAL RINSE (MEDLINE KIT)
15.0000 mL | Freq: Two times a day (BID) | OROMUCOSAL | Status: DC
  Administered 2015-08-08 – 2015-08-10 (×5): 15 mL via OROMUCOSAL

## 2015-08-08 MED ORDER — INSULIN ASPART 100 UNIT/ML ~~LOC~~ SOLN
0.0000 [IU] | SUBCUTANEOUS | Status: DC
  Administered 2015-08-08: 4 [IU] via SUBCUTANEOUS
  Administered 2015-08-08: 7 [IU] via SUBCUTANEOUS
  Administered 2015-08-09 – 2015-08-11 (×7): 3 [IU] via SUBCUTANEOUS

## 2015-08-08 MED ORDER — INSULIN GLARGINE 100 UNIT/ML ~~LOC~~ SOLN
10.0000 [IU] | Freq: Two times a day (BID) | SUBCUTANEOUS | Status: DC
  Administered 2015-08-08 – 2015-08-12 (×6): 10 [IU] via SUBCUTANEOUS
  Filled 2015-08-08 (×12): qty 0.1

## 2015-08-08 MED ORDER — HYDROMORPHONE HCL 1 MG/ML IJ SOLN: 0.5000 mg | INTRAMUSCULAR | Status: DC | PRN

## 2015-08-08 MED ORDER — ACETAMINOPHEN 650 MG RE SUPP: 650.0000 mg | Freq: Four times a day (QID) | RECTAL | Status: DC | PRN

## 2015-08-08 MED ORDER — ACETAMINOPHEN 325 MG PO TABS: 650.0000 mg | ORAL_TABLET | Freq: Four times a day (QID) | ORAL | Status: DC | PRN

## 2015-08-08 MED ORDER — PIPERACILLIN-TAZOBACTAM 3.375 G IVPB
3.3750 g | Freq: Four times a day (QID) | INTRAVENOUS | Status: DC
Start: 2015-08-08 — End: 2015-08-08

## 2015-08-08 MED ORDER — PIPERACILLIN-TAZOBACTAM 3.375 G IVPB
3.3750 g | Freq: Three times a day (TID) | INTRAVENOUS | Status: DC
  Administered 2015-08-08 – 2015-08-13 (×15): 3.375 g via INTRAVENOUS
  Filled 2015-08-08 (×19): qty 50

## 2015-08-08 MED ORDER — LACTATED RINGERS IV SOLN
INTRAVENOUS | Status: DC | PRN
  Administered 2015-08-08: 12:00:00 via INTRAVENOUS

## 2015-08-08 MED ORDER — MIDAZOLAM HCL 5 MG/5ML IJ SOLN
INTRAMUSCULAR | Status: DC | PRN
  Administered 2015-08-08: 2 mg via INTRAVENOUS

## 2015-08-08 MED ORDER — MIDAZOLAM HCL 2 MG/2ML IJ SOLN: 2.0000 mg | INTRAMUSCULAR | Status: DC | PRN

## 2015-08-08 MED ORDER — PROPOFOL 10 MG/ML IV BOLUS
62.5000 ug/kg | Freq: Once | INTRAVENOUS | Status: DC
  Filled 2015-08-08: qty 20

## 2015-08-08 MED ORDER — FENTANYL CITRATE (PF) 250 MCG/5ML IJ SOLN
INTRAMUSCULAR | Status: AC
  Filled 2015-08-08: qty 5

## 2015-08-08 MED ORDER — MIDAZOLAM HCL 2 MG/2ML IJ SOLN: 1.0000 mg | INTRAMUSCULAR | Status: DC | PRN

## 2015-08-08 MED ORDER — INSULIN ASPART 100 UNIT/ML ~~LOC~~ SOLN
0.0000 [IU] | SUBCUTANEOUS | Status: DC
  Administered 2015-08-08 (×2): 5 [IU] via SUBCUTANEOUS
  Administered 2015-08-08: 3 [IU] via SUBCUTANEOUS

## 2015-08-08 MED ORDER — LIDOCAINE HCL (CARDIAC) 20 MG/ML IV SOLN
INTRAVENOUS | Status: DC | PRN
  Administered 2015-08-08: 50 mg via INTRAVENOUS

## 2015-08-08 MED ORDER — SODIUM CHLORIDE 0.9 % IV SOLN
INTRAVENOUS | Status: DC | PRN
  Administered 2015-08-08: 10:00:00 via INTRAVENOUS

## 2015-08-08 MED ORDER — PROPOFOL 10 MG/ML IV BOLUS
INTRAVENOUS | Status: AC
  Filled 2015-08-08: qty 20

## 2015-08-08 MED ORDER — PROPOFOL 500 MG/50ML IV EMUL
INTRAVENOUS | Status: DC | PRN
  Administered 2015-08-08: 50 ug/kg/min via INTRAVENOUS

## 2015-08-08 MED ORDER — POTASSIUM CHLORIDE 10 MEQ/100ML IV SOLN
10.0000 meq | INTRAVENOUS | Status: AC
  Administered 2015-08-08 (×3): 10 meq via INTRAVENOUS
  Filled 2015-08-08 (×2): qty 100

## 2015-08-08 MED ORDER — VANCOMYCIN HCL 10 G IV SOLR
1250.0000 mg | INTRAVENOUS | Status: DC
  Administered 2015-08-08 – 2015-08-12 (×5): 1250 mg via INTRAVENOUS
  Filled 2015-08-08 (×6): qty 1250

## 2015-08-08 MED ORDER — POTASSIUM CHLORIDE IN NACL 20-0.9 MEQ/L-% IV SOLN
INTRAVENOUS | Status: DC
  Administered 2015-08-08 – 2015-08-11 (×6): via INTRAVENOUS
  Filled 2015-08-08 (×11): qty 1000

## 2015-08-08 MED ORDER — MIDAZOLAM HCL 2 MG/2ML IJ SOLN
INTRAMUSCULAR | Status: AC
  Filled 2015-08-08: qty 2

## 2015-08-08 MED ORDER — HYDROMORPHONE HCL 1 MG/ML IJ SOLN: 0.2500 mg | INTRAMUSCULAR | Status: DC | PRN

## 2015-08-08 MED ORDER — CLINDAMYCIN PHOSPHATE 600 MG/50ML IV SOLN
600.0000 mg | Freq: Three times a day (TID) | INTRAVENOUS | Status: DC
  Administered 2015-08-08 – 2015-08-13 (×16): 600 mg via INTRAVENOUS
  Filled 2015-08-08 (×20): qty 50

## 2015-08-08 MED ORDER — FENTANYL BOLUS VIA INFUSION
50.0000 ug | INTRAVENOUS | Status: DC | PRN
  Administered 2015-08-10: 50 ug via INTRAVENOUS
  Filled 2015-08-08: qty 50

## 2015-08-08 MED ORDER — SODIUM CHLORIDE 0.9 % IV BOLUS (SEPSIS)
500.0000 mL | Freq: Once | INTRAVENOUS | Status: AC
  Administered 2015-08-08: 500 mL via INTRAVENOUS

## 2015-08-08 MED ORDER — PROPOFOL 1000 MG/100ML IV EMUL
5.0000 ug/kg/min | INTRAVENOUS | Status: DC
  Filled 2015-08-08: qty 100

## 2015-08-08 MED ORDER — ONDANSETRON HCL 4 MG PO TABS: 4.0000 mg | ORAL_TABLET | Freq: Four times a day (QID) | ORAL | Status: DC | PRN

## 2015-08-08 MED ORDER — ROCURONIUM BROMIDE 100 MG/10ML IV SOLN
INTRAVENOUS | Status: DC | PRN
  Administered 2015-08-08: 30 mg via INTRAVENOUS
  Administered 2015-08-08: 50 mg via INTRAVENOUS

## 2015-08-08 MED ORDER — SUCCINYLCHOLINE CHLORIDE 20 MG/ML IJ SOLN
INTRAMUSCULAR | Status: DC | PRN
  Administered 2015-08-08: 100 mg via INTRAVENOUS

## 2015-08-08 MED ORDER — SODIUM CHLORIDE 0.9 % IR SOLN
Status: DC | PRN
  Administered 2015-08-08: 1000 mL

## 2015-08-08 MED ORDER — PANTOPRAZOLE SODIUM 40 MG IV SOLR
40.0000 mg | INTRAVENOUS | Status: DC
  Administered 2015-08-08 – 2015-08-10 (×3): 40 mg via INTRAVENOUS
  Filled 2015-08-08 (×3): qty 40

## 2015-08-08 SURGICAL SUPPLY — 33 items
BNDG GAUZE ELAST 4 BULKY (GAUZE/BANDAGES/DRESSINGS) ×6 IMPLANT
CANISTER SUCTION 2500CC (MISCELLANEOUS) ×3 IMPLANT
COVER SURGICAL LIGHT HANDLE (MISCELLANEOUS) ×3 IMPLANT
DRAPE LAPAROSCOPIC ABDOMINAL (DRAPES) IMPLANT
DRSG PAD ABDOMINAL 8X10 ST (GAUZE/BANDAGES/DRESSINGS) ×12 IMPLANT
ELECT CAUTERY BLADE 6.4 (BLADE) ×3 IMPLANT
ELECT REM PT RETURN 9FT ADLT (ELECTROSURGICAL) ×3
ELECTRODE REM PT RTRN 9FT ADLT (ELECTROSURGICAL) ×1 IMPLANT
GAUZE SPONGE 4X4 12PLY STRL (GAUZE/BANDAGES/DRESSINGS) ×3 IMPLANT
GLOVE BIO SURGEON STRL SZ7.5 (GLOVE) ×9 IMPLANT
GLOVE BIOGEL PI IND STRL 8 (GLOVE) ×2 IMPLANT
GLOVE BIOGEL PI INDICATOR 8 (GLOVE) ×4
GOWN STRL REUS W/ TWL LRG LVL3 (GOWN DISPOSABLE) ×1 IMPLANT
GOWN STRL REUS W/TWL LRG LVL3 (GOWN DISPOSABLE) ×2
HANDPIECE INTERPULSE COAX TIP (DISPOSABLE) ×2
KIT BASIN OR (CUSTOM PROCEDURE TRAY) ×3 IMPLANT
KIT ROOM TURNOVER OR (KITS) ×3 IMPLANT
LEGGING LITHOTOMY PAIR STRL (DRAPES) ×3 IMPLANT
NS IRRIG 1000ML POUR BTL (IV SOLUTION) ×3 IMPLANT
PACK GENERAL/GYN (CUSTOM PROCEDURE TRAY) ×3 IMPLANT
PAD ARMBOARD 7.5X6 YLW CONV (MISCELLANEOUS) ×3 IMPLANT
SET HNDPC FAN SPRY TIP SCT (DISPOSABLE) ×1 IMPLANT
SPONGE LAP 18X18 X RAY DECT (DISPOSABLE) ×6 IMPLANT
SUT VIC AB 3-0 SH 18 (SUTURE) ×3 IMPLANT
SUT VIC AB 3-0 SH 27 (SUTURE) ×2
SUT VIC AB 3-0 SH 27XBRD (SUTURE) ×1 IMPLANT
SWAB COLLECTION DEVICE MRSA (MISCELLANEOUS) IMPLANT
TOWEL OR 17X24 6PK STRL BLUE (TOWEL DISPOSABLE) ×3 IMPLANT
TOWEL OR 17X26 10 PK STRL BLUE (TOWEL DISPOSABLE) ×3 IMPLANT
TUBE ANAEROBIC SPECIMEN COL (MISCELLANEOUS) IMPLANT
TUBE CONNECTING 12'X1/4 (SUCTIONS) ×1
TUBE CONNECTING 12X1/4 (SUCTIONS) ×2 IMPLANT
YANKAUER SUCT BULB TIP NO VENT (SUCTIONS) ×3 IMPLANT

## 2015-08-08 NOTE — Consult Note (Signed)
PULMONARY / CRITICAL CARE MEDICINE   Name: [name] MRN: [id number] DOB: Oct 28, 1955    ADMISSION DATE:  08/07/2015 CONSULTATION DATE:  2/28  REFERRING MD:  Joseph Art   CHIEF COMPLAINT:  Post-op vent management and critical care   HISTORY OF PRESENT ILLNESS:   60 y.o. female with 20 yr hx of IDDM, HTN, depression, obesity who presented to ED 2/27 with 6 day hx of nausea, fevers and lightheadedness.On arrival reported swelling and pain about the inner thigh on the R. She has had temps up to 102 most every day x 6 days, not able to eat much, lightheaded w standing. No abd pain, CP, no diarrhea.In the ED WBC 15.8, temp 98.9, BP 99/45 and SaO2 95% on 2L [place]. Patient found to have large amount swelling R upper / medial thigh. Pelvis CT w contrast showed "aggressive infection/ cellulitis involving R medial upper thigh and groin area w extensive gas in the SQ fat....likely necrotizing fasciitis. "[facility], empiric abx were started and she then went to the OR on 2/28 for debridement. She returned to the PACU intubated w/ plans for return   PAST MEDICAL HISTORY :  She  has a past medical history of Diabetes mellitus without complication (HCC) and Hypertension.  PAST SURGICAL HISTORY: She  has no past surgical history on file.  Allergies  Allergen Reactions  . Penicillins Rash    Has patient had a PCN reaction causing immediate rash, facial/tongue/throat swelling, SOB or lightheadedness with hypotension: Yes Has patient had a PCN reaction causing severe rash involving mucus membranes or skin necrosis: No Has patient had a PCN reaction that required hospitalization No Has patient had a PCN reaction occurring within the last 10 years: No If all of the above answers are "NO", then may proceed with Cephalosporin use.     No current facility-administered medications on file prior to encounter.   No current outpatient prescriptions on file prior to encounter.    FAMILY  HISTORY:  Her has no family status information on file.   SOCIAL HISTORY: She  reports that she has been smoking Cigarettes.  She does not have any smokeless tobacco history on file. She reports that she drinks alcohol. She reports that she does not use illicit drugs.  REVIEW OF SYSTEMS:   Unable   SUBJECTIVE:  Sedated on vent   VITAL SIGNS: BP 130/71 mmHg  Pulse 98  Temp(Src) 98.8 F (37.1 C) (Oral)  Resp 29  Ht 5\' 3"  (1.6 m)  Wt 235 lb 3.7 oz (106.7 kg)  BMI 41.68 kg/m2  SpO2 98%  HEMODYNAMICS:    VENTILATOR SETTINGS: Vent Mode:  [-] PRVC FiO2 (%):  [60 %-100 %] 60 % Set Rate:  [16 bmp] 16 bmp Vt Set:  [420 mL] 420 mL Plateau Pressure:  [20 cmH20] 20 cmH20  INTAKE / OUTPUT: I/O last 3 completed shifts: In: 906.7 [I.V.:556.7; IV Piggyback:350] Out: 300 [Urine:300]  PHYSICAL EXAMINATION: General:  Chronically ill appearing 60 year old aaf; now sedated on Vent  Neuro:  Sedated, grimaces to pain  HEENT:  Neck large; orally intubated  Cardiovascular:  Rrr, no MRG Lungs:  Scattered rhonchi, equal chest rise.  Abdomen:  Soft, not tender, right groin/leg dressing intact  Musculoskeletal:  Equal st and bulk  Skin:  Warm, right leg dressing intact   LABS:  BMET  Recent Labs Lab 08/07/15 1247 08/08/15 0317 08/08/15 0908 08/08/15 1137  NA 134* 139 140 140  K 3.2* 2.9* 2.8* 2.6*  CL 91* 96* 99*  --   CO2 --   BUN 39* 42* 42*  --   CREATININE 1.66* 1.71* 1.57*  --   GLUCOSE 335* 288* 195* 176*    Electrolytes  Recent Labs Lab 08/07/15 1247 08/08/15 0317 08/08/15 0908  CALCIUM 9.4 9.1 9.0  MG  --  2.4 2.5*    CBC  Recent Labs Lab 08/07/15 1247 08/08/15 0317 08/08/15 0908 08/08/15 1137  WBC 15.8* 15.5* 13.3*  --   HGB 12.9 11.2* 11.2* 11.6*  HCT 36.7 32.2* 31.9* 34.0*  PLT 246 242 244  --     Coag's  Recent Labs Lab 08/08/15 0908  APTT 30  INR 1.24    Sepsis Markers No results for input(s): LATICACIDVEN, PROCALCITON,  O2SATVEN in the last 168 hours.  ABG No results for input(s): PHART, PCO2ART, PO2ART in the last 168 hours.  Liver Enzymes  Recent Labs Lab 08/08/15 0317  AST 15  14*  ALT 12*  12*  ALKPHOS 126  106  BILITOT 0.7  0.9  ALBUMIN 2.2*  2.1*    Cardiac Enzymes No results for input(s): TROPONINI, PROBNP in the last 168 hours.  Glucose  Recent Labs Lab 08/08/15 0047 08/08/15 0419 08/08/15 0750 08/08/15 1040 08/08/15 1340  GLUCAP 282* 263* 211* 167* 162*    Imaging Ct Pelvis W Contrast  08/07/2015  CLINICAL DATA:  Pain, swelling and redness involving the right thigh and groin area. EXAM: CT PELVIS WITH CONTRAST TECHNIQUE: Multidetector CT imaging of the pelvis was performed using the standard protocol following the bolus administration of intravenous contrast. CONTRAST:  80mL OMNIPAQUE IOHEXOL 300 MG/ML  SOLN COMPARISON:  None. FINDINGS: There is a fairly extensive infectious process involving the right medial upper thigh and groin area extending along anterior aspect of the right pelvis. There is an extensive amount of gas in the soft tissues, inflammation/edema/fluid is skin thickening. Findings are worrisome for necrotizing fasciitis-cellulitis. I do not see a discrete drainable abscess. There is marked inflammation involving the deep muscular fascia but I do not see any obvious myofasciitis or pyomyositis. No findings for septic arthritis or osteomyelitis. No significant intra pelvic abnormalities. Uterine fibroids are noted. Bladder is normal. There are borderline enlarged/ inflamed inguinal lymph nodes, right greater than left. Moderate atherosclerotic calcifications involving the aorta, iliac and common femoral arteries. No findings for deep venous thrombosis in the upper thighs were groin. There is an anterior abdominal wall hernia containing fat and part of the transverse colon. IMPRESSION: Aggressive appearing infection/cellulitis involving right medial upper thigh and groin  area with extensive gas in the subcutaneous fat. This is likely necrotizing fasciitis/cellulitis. No discrete drainable abscess or findings for deep compartment involvement. No findings for septic arthritis or osteomyelitis. Electronically Signed   By: Rudie Meyer M.D.   On: 08/07/2015 17:56   Dg Chest Port 1 View  08/08/2015  CLINICAL DATA:  Acute respiratory failure. Endotracheal tube placement. EXAM: PORTABLE CHEST 1 VIEW COMPARISON:  08/06/2010 FINDINGS: A new endotracheal tube is seen with tip approximately 2 cm above the carina. A new nasogastric tube is seen with tip in the stomach. Improved aeration of both lungs is demonstrated. There is no evidence of pulmonary consolidation, pleural effusion, or pneumothorax. IMPRESSION: Endotracheal tube and nasogastric tube in appropriate position. Improved aeration of both lungs. No evidence of consolidation or effusion. Electronically Signed   By: Myles Rosenthal M.D.   On: 08/08/2015 14:31   Dg Chest [facility]  08/07/2015  CLINICAL DATA:  60 year old female with abscess on the leg. Preop evaluation EXAM: PORTABLE CHEST 1 VIEW COMPARISON:  Radiograph dated 08/22/2014 FINDINGS: Single portable view of the chest demonstrate hypovolemic lungs. There is no focal consolidation, pleural effusion, or pneumothorax. Top-normal cardiac silhouette. No acute osseous pathology. IMPRESSION: No acute cardiopulmonary process. Electronically Signed   By: Elgie Collard M.D.   On: 08/07/2015 21:27     STUDIES:  CT abd/pelvis 2/27:Aggressive appearing infection/cellulitis involving right medial upper thigh and groin area with extensive gas in the subcutaneous fat. This is likely necrotizing fasciitis/cellulitis. No discrete drainable abscess or findings for deep compartment involvement.  CULTURES: ABSCESS 2/28: mod gpc>>>  ANTIBIOTICS: vanc 2/27>>> Zosyn 2/27>>> clinda 2/27>>>  SIGNIFICANT EVENTS: OR for I&D right leg 2/28  LINES/TUBES: OETT  2/28>>>  DISCUSSION: S/p I&D for right nec fasc. Will cont full vent support given plan for return OR visits. On approp abx, will narrow when culture date available. Cont supportive care .  ASSESSMENT / PLAN:  PULMONARY A: Ventilator dependence after I&D.  Right basilar atx P:   Cont full vent support PAD protocol  F/u cxr and abg as needed   CARDIOVASCULAR A:  Severe sepsis 2/2 nec fasc  Sedation related hypotension  P:  Tele Cont IVFs Will order PICC as will need prolonged IV access; will see how she does w/ transition to fent gtt from diprivan.   RENAL A:   AKI -->improving  Hypokalemia  P:   Avoid hypotension Cont IVFs Renal dose meds  Replaced K; recheck   GASTROINTESTINAL A:   No acute  P:   PPI For SUP  HEMATOLOGIC A:   Anemia of chronic disease.  P:  F/u cbc Transfuse per ICU protocol  Hold anticoagulation x 24hrs per surgical request  SCDs immediate post op   INFECTIOUS A:   Necrotizing fasciitis of the RLE/groin. Now s/p I&D of RLE.  P:   Wound care per surgical team -->for return to OR 3/1? Cont vanc/zosyn and clinda (see above) F/u culture data (see above)  ENDOCRINE A:   IDDM  W/ hyperglycemia  P:   ssi protocol   NEUROLOGIC A:   Sedated  Post-op pain At risk for acute encephalopathy  P:   RASS goal: -2 PAD protocol  Supportive care    FAMILY  - Updates: pending   - Inter-disciplinary family meet or Palliative Care meeting due by: 3/6  Simonne Martinet ACNP-BC [facility] Pulmonary/Critical Care Pager # [phone] OR # [phone] if no answer   08/08/2015, 2:52 PM   STAFF NOTE: I, Rory Percy, MD FACP have personally reviewed patient's available data, including medical history, events of note, physical examination and test results as part of my evaluation. I have discussed with resident/NP and other care providers such as pharmacist, RN and RRT. In addition, I personally evaluated patient and elicited key findings of:  sedated post op, lungs slight coarse, wound dressed dry, clean, heart sounds wnl, perrl 2 mm, post op resp failure secondary to [employer], maintain clinda for now for toxin inhibition howevere if remains off pressors 24 hr would dc this, change sedation to fent, dc dilauded, avoid benzo as able, back to or in am , no extubation planned, for now keep same MV, obtain abg now, pcxr reviewed, et wnl, hint fluid fissure rt, keep even, may need line, rass score -2 goal, ppi ensure, ssi, d/w surgery The patient is critically ill with multiple organ systems failure and requires high  complexity decision making for assessment and support, frequent evaluation and titration of therapies, application of advanced monitoring technologies and extensive interpretation of multiple databases.   Critical Care Time devoted to patient care services described in this note is30 Minutes. This time reflects time of care of this signee: Rory Percy, MD FACP. This critical care time does not reflect procedure time, or teaching time or supervisory time of PA/NP/Med student/Med Resident etc but could involve care discussion time. Rest per NP/medical resident whose note is outlined above and that I agree with   Mcarthur Rossetti. Tyson Alias, MD, FACP Pgr: [phone] [facility] 08/08/2015 3:48 PM

## 2015-08-08 NOTE — Progress Notes (Signed)
[facility] TEAM 1 - Stepdown/ICU TEAM Progress Note  [name]:[id number] DOB: 1956/05/30 DOA: 08/07/2015 PCP: [name], MD  Admit HPI / Brief Narrative: 60 y.o. BF PMHx 20 yr Hx IDDM, HTN, Depression, Obesity   Presented to ED today with 6 day hx of nausea, fevers and lightheadedness. She has hx of "boils" in and around her private area in the past. Now she has a lot of swelling and pain about the inner thigh on the R. She has had temps up to 102 most every day x 6 days, not able to eat much, lightheaded w standing. No abd pain, CP, no diarrhea.   In the ED WBC 15.8, temp 98.9, BP 99/45 and SaO2 95% on 2L [place]. Patient found to have large amount swelling R upper / medial thigh. Pelvis CT w contrast showed "aggressive infection/ cellulitis involving R medial upper thigh and groin area w extensive gas in the SQ fat....likely necrotizing fasciitis. " Plan is for transfer to [facility]. Seen by gen surgery here but this patient is beyond the scope of their practice here.   Lives with family, raising grandchildren. Smokes 2 cigarettes per day, no etoh. No hx heart problems, MI, afib, no hx CVA or diabetic complications (no neurop/ retinop).    HPI/Subjective: 2/28 I did not evaluate this patient today secondary to patient being in surgery all day. Attempted on 2 occasions to see patient unsuccessfully    Assessment/Plan: Necrotizing fasciitis Rt thigh - extensive gas in SQ fat by CT, no drainable abscess. Getting IV abx x 3 here (clinda/vanc/ zosyn) then plan transfer to [facility] for further evaluation. Get EKG/ CXR preop, starting IVF's. Hold all po meds and BP meds, BP is soft. -Patient for surgery today   HTN -Hold all BP medications patient currently hypotensive  Hypotension  - prob d/t infection  -Continue 0.45% saline at 190ml/hr  DM Type 2  - Continue Lantus 10 units BID -Increase to resistant SSI  -Hemoglobin A1c pending -Lipid panel  pending  Acute renal insuff  - no old labs here, may be from hypoperfusion. Starting IVF's  Hypokalemia -Potassium IV 50 mEq   Code Status: FULL Family Communication: no family present at time of exam Disposition Plan: Per surgery    Consultants: Dr.Daniel Daneil Dan PC CM Dr.Armando Derrell Lolling CCS   Procedure/Significant Events:    Culture   Antibiotics: Clindamycin 2/28>> Zosyn 2/27>> Vancomycin 2/27>>    DVT prophylaxis: SCD   Devices NA   LINES / TUBES:      Continuous Infusions: . sodium chloride 100 mL/hr at 08/08/15 0126    Objective: VITAL SIGNS: Temp: 98 F (36.7 C) (02/28 0754) Temp Source: Oral (02/28 0754) BP: 113/64 mmHg (02/28 0754) Pulse Rate: 99 (02/28 0754) SPO2; FIO2:   Intake/Output Summary (Last 24 hours) at 08/08/15 0837 Last data filed at 08/08/15 0600  Gross per 24 hour  Intake 906.67 ml  Output    300 ml  Net 606.67 ml     Exam:  Patient in surgery all day unavailable for exam    Data Reviewed: Basic Metabolic Panel:  Recent Labs Lab 08/07/15 1247 08/08/15 0317  NA 134* 139  K 3.2* 2.9*  CL 91* 96*  CO2 28 26  GLUCOSE 335* 288*  BUN 39* 42*  CREATININE 1.66* 1.71*  CALCIUM 9.4 9.1  MG  --  2.4   Liver Function Tests:  Recent Labs Lab 08/08/15 0317  AST 15  14*  ALT 12*  12*  ALKPHOS 126  106  BILITOT 0.7  0.9  PROT 6.6  6.6  ALBUMIN 2.2*  2.1*   No results for input(s): LIPASE, AMYLASE in the last 168 hours. No results for input(s): AMMONIA in the last 168 hours. CBC:  Recent Labs Lab 08/07/15 1247 08/08/15 0317  WBC 15.8* 15.5*  NEUTROABS 13.6*  --   HGB 12.9 11.2*  HCT 36.7 32.2*  MCV 95.3 94.7  PLT 246 242   Cardiac Enzymes: No results for input(s): CKTOTAL, CKMB, CKMBINDEX, TROPONINI in the last 168 hours. BNP (last 3 results) No results for input(s): BNP in the last 8760 hours.  ProBNP (last 3 results) No results for input(s): PROBNP in the last 8760  hours.  CBG:  Recent Labs Lab 08/08/15 0047 08/08/15 0419 08/08/15 0750  GLUCAP 282* 263* 211*    Recent Results (from the past 240 hour(s))  Culture, routine-abscess     Status: None (Preliminary result)   Collection Time: 08/07/15  4:40 PM  Result Value Ref Range Status   Specimen Description ABSCESS RIGHT INNER THIGH  Final   Special Requests NONE  Final   Gram Stain   Final    MODERATE WBC PRESENT,BOTH PMN AND MONONUCLEAR NO SQUAMOUS EPITHELIAL CELLS SEEN MODERATE GRAM POSITIVE COCCI IN PAIRS Performed at [facility]    Culture PENDING  Incomplete   Report Status PENDING  Incomplete  MRSA PCR Screening     Status: None   Collection Time: 08/08/15 12:11 AM  Result Value Ref Range Status   MRSA by PCR NEGATIVE NEGATIVE Final    Comment:        The GeneXpert MRSA Assay (FDA approved for NASAL specimens only), is one component of a comprehensive MRSA colonization surveillance program. It is not intended to diagnose MRSA infection nor to guide or monitor treatment for MRSA infections.      Studies:  Recent x-ray studies have been reviewed in detail by the Attending Physician  Scheduled Meds:  Scheduled Meds: . clindamycin (CLEOCIN) IV  600 mg Intravenous 3 times per day  . insulin aspart  0-9 Units Subcutaneous 6 times per day  . insulin glargine  10 Units Subcutaneous BID  . piperacillin-tazobactam (ZOSYN)  IV  3.375 g Intravenous Q8H  . potassium chloride  10 mEq Intravenous Q1 Hr x 5  . vancomycin  1,250 mg Intravenous Q24H    Time spent on care of this patient: 40 mins   Kable Haywood, Roselind Messier , MD  [facility] Office  [phone] Pager - [phone]  On-Call/Text Page:      [email or web address]      password [email or web address]  If 7PM-7AM, please contact night-coverage [email or web address] Password [facility] 08/08/2015, 8:37 AM   LOS: 1 day   Care during the described time interval was provided by me .  I have reviewed this patient's available data, including  medical history, events of note, physical examination, and all test results as part of my evaluation. I have personally reviewed and interpreted all radiology studies.   Carolyne Littles, MD [phone] Pager

## 2015-08-08 NOTE — Progress Notes (Signed)
Utilization Review Completed.  

## 2015-08-08 NOTE — Anesthesia Procedure Notes (Signed)
Procedure Name: Intubation Date/Time: 08/08/2015 12:01 PM Performed by: Marena Chancy Pre-anesthesia Checklist: Emergency Drugs available, Timeout performed, Suction available, Patient identified and Patient being monitored Patient Re-evaluated:Patient Re-evaluated prior to inductionOxygen Delivery Method: Circle system utilized Intubation Type: IV induction Ventilation: Oral airway inserted - appropriate to patient size and Mask ventilation without difficulty Laryngoscope Size: Miller and 2 Grade View: Grade I Tube type: Subglottic suction tube Tube size: 7.5 mm Number of attempts: 1 Placement Confirmation: ETT inserted through vocal cords under direct vision,  breath sounds checked- equal and bilateral and positive ETCO2 Tube secured with: Tape Dental Injury: Teeth and Oropharynx as per pre-operative assessment

## 2015-08-08 NOTE — Progress Notes (Signed)
CXR done. Dr Malen Gauze here & aware O2 sats 91 % on 60% FIO2. Lunch relief by A. Moshe Cipro RN

## 2015-08-08 NOTE — Progress Notes (Signed)
eLink Physician-Brief Progress Note Patient Name: [name] DOB: Jan 14, 1956 MRN: [id number]   Date of Service  08/08/2015  HPI/Events of Note  ABG : mild resp acidosis.  K low at 3.3, but low GFR.   eICU Interventions  Continue vent settings. Repeat AM ABG>  No K at this time.      Intervention Category Major Interventions: Acid-Base disturbance - evaluation and management Intermediate Interventions: Diagnostic test evaluation  Shane Crutch 08/08/2015, 5:58 PM

## 2015-08-08 NOTE — Progress Notes (Addendum)
Pharmacy Antibiotic Note  [name] is a 60 y.o. female admitted on 08/07/2015 with nectrozing fasciitis.  Pharmacy has been consulted for vancomycin dosing.  Of note, admitting MD has ordered to continue Zosyn started at [facility]; pt has PCN listed as allergy (rash) but tolerated first dose; will continue for now and monitor for reactions.  Plan: Rec'd vanc 1g at [facility]. Vancomycin 1250 IV every 24 hours.  Goal trough 15-20 mcg/mL.  Height:  (160 cm) Weight: 240 lb (108.863 kg) IBW/kg (Calculated) : 52.4  Temp (24hrs), Avg:98.8 F (37.1 C), Min:98.7 F (37.1 C), Max:98.9 F (37.2 C)   Recent Labs Lab 08/07/15 1247  WBC 15.8*  CREATININE 1.66*    Estimated Creatinine Clearance: 42.7 mL/min (by C-G formula based on Cr of 1.66).    Allergies  Allergen Reactions  . Penicillins Rash    Has patient had a PCN reaction causing immediate rash, facial/tongue/throat swelling, SOB or lightheadedness with hypotension: Yes Has patient had a PCN reaction causing severe rash involving mucus membranes or skin necrosis: No Has patient had a PCN reaction that required hospitalization No Has patient had a PCN reaction occurring within the last 10 years: No If all of the above answers are "NO", then may proceed with Cephalosporin use.     Antimicrobials this admission: Vancomycin 2/27 >>  Clindamycin 2/27 >>  Zosyn 2/27 >>     Thank you for allowing pharmacy to be a part of this patient's care.  Vernard Gambles, PharmD, BCPS  08/08/2015 12:26 AM

## 2015-08-08 NOTE — Anesthesia Preprocedure Evaluation (Addendum)
Anesthesia Evaluation  Patient identified by MRN, date of birth, ID band Patient awake    Reviewed: Allergy & Precautions, H&P , NPO status , Patient's Chart, lab work & pertinent test results, reviewed documented beta blocker date and time   Airway Mallampati: II  TM Distance: >3 FB Neck ROM: Full    Dental no notable dental hx. (+) Edentulous Upper, Edentulous Lower, Dental Advisory Given   Pulmonary Current Smoker,    Pulmonary exam normal breath sounds clear to auscultation       Cardiovascular hypertension, Pt. on medications and Pt. on home beta blockers  Rhythm:Regular Rate:Normal     Neuro/Psych negative neurological ROS  negative psych ROS   GI/Hepatic negative GI ROS, Neg liver ROS,   Endo/Other  diabetes, Type 2, Oral Hypoglycemic AgentsMorbid obesity  Renal/GU Renal InsufficiencyRenal disease  negative genitourinary   Musculoskeletal   Abdominal   Peds  Hematology negative hematology ROS (+)   Anesthesia Other Findings   Reproductive/Obstetrics negative OB ROS                           Anesthesia Physical Anesthesia Plan  ASA: III  Anesthesia Plan: General   Post-op Pain Management:    Induction: Intravenous  Airway Management Planned: Oral ETT  Additional Equipment:   Intra-op Plan:   Post-operative Plan: Extubation in OR and Possible Post-op intubation/ventilation  Informed Consent: I have reviewed the patients History and Physical, chart, labs and discussed the procedure including the risks, benefits and alternatives for the proposed anesthesia with the patient or authorized representative who has indicated his/her understanding and acceptance.   Dental advisory given  Plan Discussed with: CRNA and Surgeon  Anesthesia Plan Comments:        Anesthesia Quick Evaluation

## 2015-08-08 NOTE — Progress Notes (Signed)
eLink Physician-Brief Progress Note Patient Name: [name] DOB: 1955/06/29 MRN: [id number]   Date of Service  08/08/2015  HPI/Events of Note  Hypotension after surgery on sedation  eICU Interventions  Given 500 cc NS bolus     Intervention Category Major Interventions: Hypotension - evaluation and management  Shane Crutch 08/08/2015, 4:17 PM

## 2015-08-08 NOTE — Transfer of Care (Signed)
Immediate Anesthesia Transfer of Care Note  Patient: [name]  Procedure(s) Performed: Procedure(s): Irrigation and Debridement of Right Lower Ext. (Right)  Patient Location: PACU  Anesthesia Type:General  Level of Consciousness: sedated and unresponsive  Airway & Oxygen Therapy: Patient remains intubated per anesthesia plan and Patient placed on Ventilator (see vital sign flow sheet for setting)  Post-op Assessment: Report given to RN and Post -op Vital signs reviewed and stable  Post vital signs: Reviewed and stable  Last Vitals:  Filed Vitals:   08/08/15 0600 08/08/15 0754  BP: 108/69 113/64  Pulse: 96 99  Temp:  36.7 C  Resp: 19 20    Complications: No apparent anesthesia complications

## 2015-08-08 NOTE — Progress Notes (Signed)
Pt came to PACU on Propofol drip at 6mcg/kg/min. RT at bedside.

## 2015-08-08 NOTE — Anesthesia Postprocedure Evaluation (Signed)
Anesthesia Post Note  Patient: [name]  Procedure(s) Performed: Procedure(s) (LRB): Irrigation and Debridement of Right Lower Ext. (Right)  Patient location during evaluation: PACU Anesthesia Type: General Level of consciousness: sedated and patient remains intubated per anesthesia plan Pain management: pain level controlled Vital Signs Assessment: post-procedure vital signs reviewed and stable Respiratory status: patient remains intubated per anesthesia plan and patient on ventilator - see flowsheet for VS Cardiovascular status: blood pressure returned to baseline and stable Postop Assessment: no signs of nausea or vomiting Anesthetic complications: no    Last Vitals:  Filed Vitals:   08/08/15 1424 08/08/15 1429  BP: 105/58 130/71  Pulse: 95 98  Temp:    Resp: 17 29    Last Pain:  Filed Vitals:   08/08/15 1440  PainSc: 0-No pain                 Tamaka Sawin A.

## 2015-08-08 NOTE — Progress Notes (Signed)
Subjective: Pt reports she started with an area in her right groin last week.  She says it got worse.  She was seen at [facility] and transferred here to [facility] LAST PM.  She has large fluctuant site right thigh, with gas noted on exam.  She is for I&D of sites today  Objective: Vital signs in last 24 hours: Temp:  [98.7 F (37.1 C)-99 F (37.2 C)] 99 F (37.2 C) (02/28 0420) Pulse Rate:  [93-104] 96 (02/28 0600) Resp:  [16-31] 19 (02/28 0600) BP: (98-124)/(43-69) 108/69 mmHg (02/28 0600) SpO2:  [85 %-98 %] 93 % (02/28 0600) Weight:  [106.7 kg (235 lb 3.7 oz)-108.863 kg (240 lb)] 106.7 kg (235 lb 3.7 oz) (02/28 0420) Last BM Date: 08/07/15 CT scan:  Aggressive appearing infection/cellulitis involving right medial upper thigh and groin area with extensive gas in the subcutaneous fat. This is likely necrotizing fasciitis/cellulitis. No discrete drainable abscess or findings for deep compartment involvement.   Labs at 3 AM:  WBC last PM 15.5  Creatinine 1.71, Glucose at 288, K+ 2.9 Intake/Output from previous day: 02/27 0701 - 02/28 0700 In: 906.7 [I.V.:556.7; IV Piggyback:350] Out: 300 [Urine:300] Intake/Output this shift:    General appearance: alert, cooperative and no distress Resp: clear to auscultation bilaterally Skin: she has a large area right groin fluctuant with gas noted at the skin level, it is tender, but not as much as you would think.  It appears to go up toward the groin.  Lab Results:   Recent Labs  08/07/15 1247 08/08/15 0317  WBC 15.8* 15.5*  HGB 12.9 11.2*  HCT 36.7 32.2*  PLT 246 242    BMET  Recent Labs  08/07/15 1247 08/08/15 0317  NA 134* 139  K 3.2* 2.9*  CL 91* 96*  CO2 28 26  GLUCOSE 335* 288*  BUN 39* 42*  CREATININE 1.66* 1.71*  CALCIUM 9.4 9.1   PT/INR No results for input(s): LABPROT, INR in the last 72 hours.   Recent Labs Lab 08/08/15 0317  AST 15  14*  ALT 12*  12*  ALKPHOS 126  106  BILITOT 0.7  0.9  PROT 6.6  6.6   ALBUMIN 2.2*  2.1*     Lipase  No results found for: LIPASE   Studies/Results: Ct Pelvis W Contrast  08/07/2015  CLINICAL DATA:  Pain, swelling and redness involving the right thigh and groin area. EXAM: CT PELVIS WITH CONTRAST TECHNIQUE: Multidetector CT imaging of the pelvis was performed using the standard protocol following the bolus administration of intravenous contrast. CONTRAST:  80mL OMNIPAQUE IOHEXOL 300 MG/ML  SOLN COMPARISON:  None. FINDINGS: There is a fairly extensive infectious process involving the right medial upper thigh and groin area extending along anterior aspect of the right pelvis. There is an extensive amount of gas in the soft tissues, inflammation/edema/fluid is skin thickening. Findings are worrisome for necrotizing fasciitis-cellulitis. I do not see a discrete drainable abscess. There is marked inflammation involving the deep muscular fascia but I do not see any obvious myofasciitis or pyomyositis. No findings for septic arthritis or osteomyelitis. No significant intra pelvic abnormalities. Uterine fibroids are noted. Bladder is normal. There are borderline enlarged/ inflamed inguinal lymph nodes, right greater than left. Moderate atherosclerotic calcifications involving the aorta, iliac and common femoral arteries. No findings for deep venous thrombosis in the upper thighs were groin. There is an anterior abdominal wall hernia containing fat and part of the transverse colon. IMPRESSION: Aggressive appearing infection/cellulitis involving right medial upper  thigh and groin area with extensive gas in the subcutaneous fat. This is likely necrotizing fasciitis/cellulitis. No discrete drainable abscess or findings for deep compartment involvement. No findings for septic arthritis or osteomyelitis. Electronically Signed   By: Rudie Meyer M.D.   On: 08/07/2015 17:56   Dg Chest Port 1 View  08/07/2015  CLINICAL DATA:  60 year old female with abscess on the leg. Preop evaluation  EXAM: PORTABLE CHEST 1 VIEW COMPARISON:  Radiograph dated 08/22/2014 FINDINGS: Single portable view of the chest demonstrate hypovolemic lungs. There is no focal consolidation, pleural effusion, or pneumothorax. Top-normal cardiac silhouette. No acute osseous pathology. IMPRESSION: No acute cardiopulmonary process. Electronically Signed   By: Elgie Collard M.D.   On: 08/07/2015 21:27    Medications: . clindamycin (CLEOCIN) IV  600 mg Intravenous 3 times per day  . insulin aspart  0-9 Units Subcutaneous 6 times per day  . insulin glargine  10 Units Subcutaneous BID  . piperacillin-tazobactam (ZOSYN)  IV  3.375 g Intravenous Q8H  . vancomycin  1,250 mg Intravenous Q24H   . sodium chloride 100 mL/hr at 08/08/15 0126   Assessment/Plan Necrotizing soft tissue infection right thigh, groin and lower abdominal wall Insulin dependant diabetes Obesity Body mass index is 41.7 Hypotension with a hx of hypertension - improved Renal insuffiencey  Hypokalemia Antibiotics:  Clindamycin/Zosyn/Vancomycin started last PM DVT:  SCD  Plan:  Recheck labs this AM, she will need to have coags, up to date K+.  Dr.Ramirez will take her to the OR later today for incision and drainage of right groin and thigh necrotizing soft tissue infection.  I discussed this with her and she is agreeable to surgery.          LOS: 1 day    Derren Suydam 08/08/2015

## 2015-08-08 NOTE — Consult Note (Signed)
Reason for Consult:Soft tissue infection Referring Physician: EDP  [name] is an 60 y.o. female.  HPI: One week of right thigh infection that has gotten much worse.  For OR soon.  Past Medical History  Diagnosis Date  . Diabetes mellitus without complication ([place])   . Hypertension     History reviewed. No pertinent past surgical history.  No family history on file.  Social History:  reports that she has been smoking Cigarettes.  She does not have any smokeless tobacco history on file. She reports that she drinks alcohol. She reports that she does not use illicit drugs.  Allergies:  Allergies  Allergen Reactions  . Penicillins Rash    Has patient had a PCN reaction causing immediate rash, facial/tongue/throat swelling, SOB or lightheadedness with hypotension: Yes Has patient had a PCN reaction causing severe rash involving mucus membranes or skin necrosis: No Has patient had a PCN reaction that required hospitalization No Has patient had a PCN reaction occurring within the last 10 years: No If all of the above answers are "NO", then may proceed with Cephalosporin use.     Medications: I have reviewed the patient's current medications.  Results for orders placed or performed during the hospital encounter of 08/07/15 (from the past 48 hour(s))  CBC with Differential     Status: Abnormal   Collection Time: 08/07/15 12:47 PM  Result Value Ref Range   WBC 15.8 (H) 4.0 - 10.5 K/uL   RBC 3.85 (L) 3.87 - 5.11 MIL/uL   Hemoglobin 12.9 12.0 - 15.0 g/dL   HCT 36.7 36.0 - 46.0 %   MCV 95.3 78.0 - 100.0 fL   MCH 33.5 26.0 - 34.0 pg   MCHC 35.1 30.0 - 36.0 g/dL   RDW 13.9 11.5 - 15.5 %   Platelets 246 150 - 400 K/uL   Neutrophils Relative % 86 %   Neutro Abs 13.6 (H) 1.7 - 7.7 K/uL   Lymphocytes Relative 9 %   Lymphs Abs 1.4 0.7 - 4.0 K/uL   Monocytes Relative 5 %   Monocytes Absolute 0.8 0.1 - 1.0 K/uL   Eosinophils Relative 0 %   Eosinophils Absolute 0.0 0.0 - 0.7  K/uL   Basophils Relative 0 %   Basophils Absolute 0.0 0.0 - 0.1 K/uL  Basic metabolic panel     Status: Abnormal   Collection Time: 08/07/15 12:47 PM  Result Value Ref Range   Sodium 134 (L) 135 - 145 mmol/L   Potassium 3.2 (L) 3.5 - 5.1 mmol/L   Chloride 91 (L) 101 - 111 mmol/L   CO2 28 22 - 32 mmol/L   Glucose, Bld 335 (H) 65 - 99 mg/dL   BUN 39 (H) 6 - 20 mg/dL   Creatinine, Ser 1.66 (H) 0.44 - 1.00 mg/dL   Calcium 9.4 8.9 - 10.3 mg/dL   GFR calc non Af Amer 33 (L) >60 mL/min   GFR calc Af Amer 38 (L) >60 mL/min    Comment: (NOTE) The eGFR has been calculated using the CKD EPI equation. This calculation has not been validated in all clinical situations. eGFR's persistently <60 mL/min signify possible Chronic Kidney Disease.    Anion gap 15 5 - 15  MRSA PCR Screening     Status: None   Collection Time: 08/08/15 12:11 AM  Result Value Ref Range   MRSA by PCR NEGATIVE NEGATIVE    Comment:        The GeneXpert MRSA Assay (FDA approved for NASAL  specimens only), is one component of a comprehensive MRSA colonization surveillance program. It is not intended to diagnose MRSA infection nor to guide or monitor treatment for MRSA infections.   Glucose, capillary     Status: Abnormal   Collection Time: 08/08/15 12:47 AM  Result Value Ref Range   Glucose-Capillary 282 (H) 65 - 99 mg/dL    Ct Pelvis W Contrast  08/07/2015  CLINICAL DATA:  Pain, swelling and redness involving the right thigh and groin area. EXAM: CT PELVIS WITH CONTRAST TECHNIQUE: Multidetector CT imaging of the pelvis was performed using the standard protocol following the bolus administration of intravenous contrast. CONTRAST:  58m OMNIPAQUE IOHEXOL 300 MG/ML  SOLN COMPARISON:  None. FINDINGS: There is a fairly extensive infectious process involving the right medial upper thigh and groin area extending along anterior aspect of the right pelvis. There is an extensive amount of gas in the soft tissues,  inflammation/edema/fluid is skin thickening. Findings are worrisome for necrotizing fasciitis-cellulitis. I do not see a discrete drainable abscess. There is marked inflammation involving the deep muscular fascia but I do not see any obvious myofasciitis or pyomyositis. No findings for septic arthritis or osteomyelitis. No significant intra pelvic abnormalities. Uterine fibroids are noted. Bladder is normal. There are borderline enlarged/ inflamed inguinal lymph nodes, right greater than left. Moderate atherosclerotic calcifications involving the aorta, iliac and common femoral arteries. No findings for deep venous thrombosis in the upper thighs were groin. There is an anterior abdominal wall hernia containing fat and part of the transverse colon. IMPRESSION: Aggressive appearing infection/cellulitis involving right medial upper thigh and groin area with extensive gas in the subcutaneous fat. This is likely necrotizing fasciitis/cellulitis. No discrete drainable abscess or findings for deep compartment involvement. No findings for septic arthritis or osteomyelitis. Electronically Signed   By: PMarijo SanesM.D.   On: 08/07/2015 17:56   Dg Chest Port 1 View  08/07/2015  CLINICAL DATA:  60year old female with abscess on the leg. Preop evaluation EXAM: PORTABLE CHEST 1 VIEW COMPARISON:  Radiograph dated 08/22/2014 FINDINGS: Single portable view of the chest demonstrate hypovolemic lungs. There is no focal consolidation, pleural effusion, or pneumothorax. Top-normal cardiac silhouette. No acute osseous pathology. IMPRESSION: No acute cardiopulmonary process. Electronically Signed   By: AAnner CreteM.D.   On: 08/07/2015 21:27    Review of Systems  Constitutional: Positive for fever and chills.  Musculoskeletal:       Leg pain right groin and upper thigh   Blood pressure 114/62, pulse 94, temperature 99 F (37.2 C), temperature source Oral, resp. rate 18, height 5' 3"  (1.6 m), weight 106.7 kg (235 lb 3.7  oz), SpO2 91 %. Physical Exam  Vitals reviewed. Constitutional: She is oriented to person, place, and time. She appears well-nourished.  Obese  HENT:  Head: Normocephalic and atraumatic.  Right Ear: External ear normal.  Cardiovascular: Normal rate, regular rhythm and normal heart sounds.   Respiratory: Effort normal and breath sounds normal.  GI: There is tenderness (Tender in the RLQ near right inguinal region).  Musculoskeletal: Normal range of motion.       Right upper leg: She exhibits tenderness, swelling and edema. She exhibits no bony tenderness.       Legs: Neurological: She is alert and oriented to person, place, and time.  Skin: Skin is warm.  Psychiatric: She has a normal mood and affect. Her behavior is normal. Judgment and thought content normal.    Assessment/Plan: Necrotizing soft tissue infection of the right  upper inner thigh region extending into the right groin and lower abdominal wall.  CT demonstrates lots of gas in the tissues.  Foul-smelling fluid from the wound.   Will need incision/drainage and debridement of the right thigh this AM.  Patient is getting Vanco Clinda and Zosyn.  She is awake and alert and not septic.  NPO until surgery.  Cassandra Harbold 08/08/2015, 4:34 AM

## 2015-08-08 NOTE — Brief Op Note (Addendum)
08/08/2015  1:16 PM  PATIENT:  [name]  60 y.o. female  PRE-OPERATIVE DIAGNOSIS:  Necrotizing soft tissue infection, right lower extremity POST-OPERATIVE DIAGNOSIS:  Necrotizing fasciitis right lower extremity, necrotizing soft tissue infection 30 x 25 cm  PROCEDURE:  Procedure(s): Irrigation and Debridement of Right Lower Ext. (Right)  SURGEON:  Surgeon(s) and Role:    * Axel Filler, MD - Primary   ANESTHESIA:   general  EBL: 100 mL   Total I/O In: 350 [I.V.:350] Out: -   BLOOD ADMINISTERED:none  DRAINS: none   LOCAL MEDICATIONS USED:  NONE  SPECIMEN:  Source of Specimen:  Right lower extremity soft tissue infection  DISPOSITION OF SPECIMEN:  Microbiology  COUNTS:  YES  TOURNIQUET:  * No tourniquets in log *  DICTATION: .Dragon Dictation   Indication: Patient is a 61 year old female with approximately a 1 week history of right medial thigh infection. Patient states that the area continued to increase. Patient was evaluated at an outside ER with a CT scan which revealed a gas producing necrotizing soft tissue infection.  The patient was transferred to [facility] for further evaluation and treatment.  Details of the procedure:  After the patient was consented she was taken back to the operating room and placed in the supine position with bilateral SCDs in place. She was then placed in lithotomy position with a Foley catheter in place. The patient was pepped and draped in the usual sterile fashion. A timeout was called all facts verified.  The area to the right medial thigh was incised elliptically. There is large amount of dirty dishwater fluid. Patient had some air in the subcutaneous tissues tissue. There also appeared to be necrotizing soft tissue infection of the subcutaneous tissue. This was debrided down to the fascia medially. The necrotic tissue tracked cephalad as well as laterally. This was all debrided. The area of the superior  quadriceps muscles and fascia was necrotic and incised and debrided. The infection tracked caudad. The subcutaneous tissues tissue here was also debrided.   At this time the area was irrigated out with a pulse lavage. Hemostasis was achieved using electrocautery and 2-0 Vicryl stitches.  The final measurement of the debrided area measured approximately 30 x 25 cm  The area was then packed with Betadine soaked Kerlix 2. The wound was covered with ABDs pad, and tape.   The patient remained intubated and was taken to the recovery room in stable condition.     PLAN OF CARE: Admit to inpatient   PATIENT DISPOSITION:  ICU - intubated and hemodynamically stable.   Delay start of Pharmacological VTE agent (>24hrs) due to surgical blood loss or risk of bleeding: not applicable

## 2015-08-08 NOTE — Progress Notes (Signed)
ABG results called to Elink. Per Dr. Nicholos Johns, no vent changes at this time. Repeat ABG in AM. RT Will continue to monitor.

## 2015-08-09 ENCOUNTER — Inpatient Hospital Stay (HOSPITAL_COMMUNITY): Payer: BLUE CROSS/BLUE SHIELD | Admitting: Anesthesiology

## 2015-08-09 ENCOUNTER — Encounter (HOSPITAL_COMMUNITY): Payer: Self-pay | Admitting: General Surgery

## 2015-08-09 ENCOUNTER — Encounter (HOSPITAL_COMMUNITY)
Admission: EM | Disposition: A | Discharge status: Skilled Nursing Facility | Payer: Self-pay | Source: Home / Self Care | Attending: Internal Medicine

## 2015-08-09 DIAGNOSIS — J9622 Acute and chronic respiratory failure with hypercapnia: Secondary | ICD-10-CM

## 2015-08-09 DIAGNOSIS — J9621 Acute and chronic respiratory failure with hypoxia: Secondary | ICD-10-CM

## 2015-08-09 HISTORY — PX: INCISION AND DRAINAGE: SHX5863

## 2015-08-09 LAB — CBC
HCT: 28.8 % — ABNORMAL LOW (ref 36.0–46.0)
Hemoglobin: 9.6 g/dL — ABNORMAL LOW (ref 12.0–15.0)
MCH: 31.5 pg (ref 26.0–34.0)
MCHC: 33.3 g/dL (ref 30.0–36.0)
MCV: 94.4 fL (ref 78.0–100.0)
PLATELETS: 249 10*3/uL (ref 150–400)
RBC: 3.05 MIL/uL — AB (ref 3.87–5.11)
RDW: 14.6 % (ref 11.5–15.5)
WBC: 13 10*3/uL — ABNORMAL HIGH (ref 4.0–10.5)

## 2015-08-09 LAB — GLUCOSE, CAPILLARY
GLUCOSE-CAPILLARY: 102 mg/dL — AB (ref 65–99)
GLUCOSE-CAPILLARY: 108 mg/dL — AB (ref 65–99)
GLUCOSE-CAPILLARY: 119 mg/dL — AB (ref 65–99)
GLUCOSE-CAPILLARY: 92 mg/dL (ref 65–99)
Glucose-Capillary: 133 mg/dL — ABNORMAL HIGH (ref 65–99)
Glucose-Capillary: 133 mg/dL — ABNORMAL HIGH (ref 65–99)

## 2015-08-09 LAB — BASIC METABOLIC PANEL
Anion gap: 14 (ref 5–15)
BUN: 47 mg/dL — AB (ref 6–20)
CO2: 25 mmol/L (ref 22–32)
CREATININE: 1.56 mg/dL — AB (ref 0.44–1.00)
Calcium: 8.3 mg/dL — ABNORMAL LOW (ref 8.9–10.3)
Chloride: 103 mmol/L (ref 101–111)
GFR calc Af Amer: 41 mL/min — ABNORMAL LOW (ref >60)
GFR, EST NON AFRICAN AMERICAN: 35 mL/min — AB (ref >60)
Glucose, Bld: 114 mg/dL — ABNORMAL HIGH (ref 65–99)
POTASSIUM: 3.2 mmol/L — AB (ref 3.5–5.1)
SODIUM: 142 mmol/L (ref 135–145)

## 2015-08-09 LAB — HEMOGLOBIN A1C
HEMOGLOBIN A1C: 9.8 % — AB (ref 4.8–5.6)
Mean Plasma Glucose: 235 mg/dL

## 2015-08-09 LAB — BLOOD GAS, ARTERIAL
ACID-BASE EXCESS: 0.7 mmol/L (ref 0.0–2.0)
Bicarbonate: 26.4 mEq/L — ABNORMAL HIGH (ref 20.0–24.0)
DRAWN BY: 225631
FIO2: 0.6
MECHVT: 600 mL
O2 Saturation: 97.7 %
PEEP: 5 cmH2O
Patient temperature: 98.6
RATE: 16 resp/min
TCO2: 28.1 mmol/L (ref 0–100)
pCO2 arterial: 55.5 mmHg — ABNORMAL HIGH (ref 35.0–45.0)
pH, Arterial: 7.299 — ABNORMAL LOW (ref 7.350–7.450)
pO2, Arterial: 111 mmHg — ABNORMAL HIGH (ref 80.0–100.0)

## 2015-08-09 LAB — PROCALCITONIN: PROCALCITONIN: 5.15 ng/mL

## 2015-08-09 LAB — TRIGLYCERIDES: TRIGLYCERIDES: 351 mg/dL — AB (ref <150)

## 2015-08-09 SURGERY — INCISION AND DRAINAGE
Anesthesia: General | Site: Leg Lower | Laterality: Right

## 2015-08-09 MED ORDER — MIDAZOLAM HCL 2 MG/2ML IJ SOLN
INTRAMUSCULAR | Status: AC
  Filled 2015-08-09: qty 2

## 2015-08-09 MED ORDER — PHENYLEPHRINE HCL 10 MG/ML IJ SOLN
10.0000 mg | INTRAVENOUS | Status: DC | PRN
  Administered 2015-08-09: 20 ug/min via INTRAVENOUS

## 2015-08-09 MED ORDER — PROPOFOL 1000 MG/100ML IV EMUL
0.0000 ug/kg/min | INTRAVENOUS | Status: DC
  Administered 2015-08-09 – 2015-08-10 (×2): 5 ug/kg/min via INTRAVENOUS
  Filled 2015-08-09 (×2): qty 100

## 2015-08-09 MED ORDER — FENTANYL CITRATE (PF) 250 MCG/5ML IJ SOLN
INTRAMUSCULAR | Status: AC
  Filled 2015-08-09: qty 5

## 2015-08-09 MED ORDER — SODIUM CHLORIDE 0.9 % IV SOLN
2500.0000 ug | INTRAVENOUS | Status: DC | PRN
  Administered 2015-08-09: 200 ug/h via INTRAVENOUS

## 2015-08-09 MED ORDER — POTASSIUM CHLORIDE 20 MEQ/15ML (10%) PO SOLN
40.0000 meq | Freq: Once | ORAL | Status: AC
  Administered 2015-08-09: 40 meq
  Filled 2015-08-09: qty 30

## 2015-08-09 MED ORDER — 0.9 % SODIUM CHLORIDE (POUR BTL) OPTIME
TOPICAL | Status: DC | PRN
  Administered 2015-08-09: 1000 mL

## 2015-08-09 MED ORDER — MIDAZOLAM HCL 5 MG/5ML IJ SOLN
INTRAMUSCULAR | Status: DC | PRN
  Administered 2015-08-09: 2 mg via INTRAVENOUS

## 2015-08-09 MED ORDER — PHENYLEPHRINE HCL 10 MG/ML IJ SOLN
INTRAMUSCULAR | Status: DC | PRN
  Administered 2015-08-09: 120 ug via INTRAVENOUS
  Administered 2015-08-09 (×3): 80 ug via INTRAVENOUS
  Administered 2015-08-09: 120 ug via INTRAVENOUS

## 2015-08-09 MED ORDER — ACETAMINOPHEN 160 MG/5ML PO SOLN: 650.0000 mg | Freq: Four times a day (QID) | ORAL | Status: DC | PRN

## 2015-08-09 MED ORDER — SODIUM CHLORIDE 0.9 % IR SOLN
Status: DC | PRN
  Administered 2015-08-09: 3000 mL

## 2015-08-09 MED ORDER — ROCURONIUM BROMIDE 100 MG/10ML IV SOLN
INTRAVENOUS | Status: DC | PRN
  Administered 2015-08-09: 30 mg via INTRAVENOUS

## 2015-08-09 MED ORDER — LACTATED RINGERS IV SOLN
INTRAVENOUS | Status: DC | PRN
  Administered 2015-08-09: 18:00:00 via INTRAVENOUS

## 2015-08-09 MED ORDER — FENTANYL CITRATE (PF) 100 MCG/2ML IJ SOLN
INTRAMUSCULAR | Status: DC | PRN
  Administered 2015-08-09: 50 ug via INTRAVENOUS

## 2015-08-09 MED ORDER — SODIUM CHLORIDE 0.9% FLUSH: 10.0000 mL | INTRAVENOUS | Status: DC | PRN

## 2015-08-09 MED ORDER — SODIUM CHLORIDE 0.9 % IV BOLUS (SEPSIS)
1000.0000 mL | Freq: Once | INTRAVENOUS | Status: AC
  Administered 2015-08-09: 1000 mL via INTRAVENOUS

## 2015-08-09 MED ORDER — SODIUM CHLORIDE 0.9% FLUSH
10.0000 mL | Freq: Two times a day (BID) | INTRAVENOUS | Status: DC
  Administered 2015-08-09: 30 mL
  Administered 2015-08-10 – 2015-08-11 (×2): 10 mL

## 2015-08-09 SURGICAL SUPPLY — 26 items
BNDG GAUZE ELAST 4 BULKY (GAUZE/BANDAGES/DRESSINGS) ×4 IMPLANT
CANISTER SUCTION 2500CC (MISCELLANEOUS) ×4 IMPLANT
COVER SURGICAL LIGHT HANDLE (MISCELLANEOUS) ×4 IMPLANT
DRAPE LAPAROSCOPIC ABDOMINAL (DRAPES) ×4 IMPLANT
DRSG PAD ABDOMINAL 8X10 ST (GAUZE/BANDAGES/DRESSINGS) ×4 IMPLANT
ELECT CAUTERY BLADE 6.4 (BLADE) ×4 IMPLANT
ELECT REM PT RETURN 9FT ADLT (ELECTROSURGICAL) ×4
ELECTRODE REM PT RTRN 9FT ADLT (ELECTROSURGICAL) ×2 IMPLANT
GAUZE SPONGE 4X4 12PLY STRL (GAUZE/BANDAGES/DRESSINGS) IMPLANT
GLOVE BIO SURGEON STRL SZ7.5 (GLOVE) IMPLANT
GLOVE BIOGEL PI IND STRL 8 (GLOVE) IMPLANT
GLOVE BIOGEL PI INDICATOR 8 (GLOVE)
GOWN STRL REUS W/ TWL LRG LVL3 (GOWN DISPOSABLE) ×4 IMPLANT
GOWN STRL REUS W/TWL LRG LVL3 (GOWN DISPOSABLE) ×4
HANDPIECE INTERPULSE COAX TIP (DISPOSABLE) ×2
KIT BASIN OR (CUSTOM PROCEDURE TRAY) ×4 IMPLANT
KIT ROOM TURNOVER OR (KITS) ×4 IMPLANT
MANIFOLD NEPTUNE II (INSTRUMENTS) ×4 IMPLANT
NS IRRIG 1000ML POUR BTL (IV SOLUTION) ×4 IMPLANT
PACK GENERAL/GYN (CUSTOM PROCEDURE TRAY) ×4 IMPLANT
PAD ARMBOARD 7.5X6 YLW CONV (MISCELLANEOUS) ×4 IMPLANT
SET HNDPC FAN SPRY TIP SCT (DISPOSABLE) ×2 IMPLANT
SUT SILK 3 0 SH 30 (SUTURE) ×4 IMPLANT
SUT SILK 3 0SH CR/8 30 (SUTURE) ×4 IMPLANT
TAPE CLOTH SURG 6X10 WHT LF (GAUZE/BANDAGES/DRESSINGS) ×4 IMPLANT
TOWEL OR 17X26 10 PK STRL BLUE (TOWEL DISPOSABLE) ×4 IMPLANT

## 2015-08-09 NOTE — Progress Notes (Signed)
PULMONARY / CRITICAL CARE MEDICINE   Name: [name] MRN: [id number] DOB: 1955-12-25    ADMISSION DATE:  08/07/2015 CONSULTATION DATE:  08/08/2015  REFERRING MD:  Dr. Joseph Art, [facility]  CHIEF COMPLAINT:  Abscess  SUBJECTIVE:  BP improved.  Pain controlled in leg.  VITAL SIGNS: BP 100/65 mmHg  Pulse 88  Temp(Src) 99.1 F (37.3 C) (Oral)  Resp 10  Ht  (1.6 m)  Wt 240 lb 1.3 oz (108.9 kg)  BMI 42.54 kg/m2  SpO2 100%  VENTILATOR SETTINGS: Vent Mode:  [-] PRVC FiO2 (%):  [60 %-100 %] 60 % Set Rate:  [16 bmp] 16 bmp Vt Set:  [420 mL] 420 mL PEEP:  [5 cmH20] 5 cmH20 Plateau Pressure:  [16 cmH20-20 cmH20] 16 cmH20  INTAKE / OUTPUT: I/O last 3 completed shifts: In: 5572.4 [I.V.:3224.9; NG/GT:60; IV Piggyback:2287.5] Out: 2570 [Urine:2170; Emesis/NG output:400]  PHYSICAL EXAMINATION: General: sleepy Neuro:  Follows commands, moves extremities, RASS -1 HEENT:  ETT in place Cardiovascular:  Regular, no murmur Lungs:  No wheeze Abdomen:  Soft, non tender Musculoskeletal:  Rt thigh wound dressing clean Skin:  No rashes  LABS:  BMET  Recent Labs Lab 08/08/15 1355 08/08/15 1640 08/09/15 0255  NA 141 141 142  K 3.1* 3.3* 3.2*  CL 102 100* 103  CO2 BUN 40* 42* 47*  CREATININE 1.41* 1.54* 1.56*  GLUCOSE 174* 219* 114*    Electrolytes  Recent Labs Lab 08/08/15 0317 08/08/15 0908 08/08/15 1355 08/08/15 1640 08/09/15 0255  CALCIUM 9.1 9.0 8.5* 8.5* 8.3*  MG 2.4 2.5*  --   --   --     CBC  Recent Labs Lab 08/08/15 0317 08/08/15 0908 08/08/15 1137 08/09/15 0255  WBC 15.5* 13.3*  --  13.0*  HGB 11.2* 11.2* 11.6* 9.6*  HCT 32.2* 31.9* 34.0* 28.8*  PLT 242 244  --  249    Coag's  Recent Labs Lab 08/08/15 0908  APTT 30  INR 1.24    Sepsis Markers No results for input(s): LATICACIDVEN, PROCALCITON, O2SATVEN in the last 168 hours.  ABG  Recent Labs Lab 08/08/15 1739 08/09/15 0420  PHART 7.291* 7.299*  PCO2ART 56.0*  55.5*  PO2ART 66.0* 111*    Liver Enzymes  Recent Labs Lab 08/08/15 0317  AST 15  14*  ALT 12*  12*  ALKPHOS 126  106  BILITOT 0.7  0.9  ALBUMIN 2.2*  2.1*    Glucose  Recent Labs Lab 08/08/15 0750 08/08/15 1040 08/08/15 1340 08/08/15 1934 08/09/15 0006 08/09/15 0350  GLUCAP 211* 167* 162* 194* 119* 102*    Imaging Dg Chest Port 1 View  08/08/2015  CLINICAL DATA:  Acute respiratory failure. Endotracheal tube placement. EXAM: PORTABLE CHEST 1 VIEW COMPARISON:  08/06/2010 FINDINGS: A new endotracheal tube is seen with tip approximately 2 cm above the carina. A new nasogastric tube is seen with tip in the stomach. Improved aeration of both lungs is demonstrated. There is no evidence of pulmonary consolidation, pleural effusion, or pneumothorax. IMPRESSION: Endotracheal tube and nasogastric tube in appropriate position. Improved aeration of both lungs. No evidence of consolidation or effusion. Electronically Signed   By: Myles Rosenthal M.D.   On: 08/08/2015 14:31     STUDIES:  2/27 CT pelvis >> infection Rt medial thigh and groin with gas in subcutaneous tissue  CULTURES: 2/27 Rt leg wound >>   ANTIBIOTICS: 2/27 Clindamycin >> 2/27 Zosyn >> 2/27 Vancomycin >>   SIGNIFICANT EVENTS: 2/27 Admit 2/28  Surgery consulted >> I&D Rt leg   LINES/TUBES: 2/28 ETT >>  DISCUSSION: 60 yo female with 6 days of nausea, fever up to 102F, dizziness from Rt inner thigh abscess and necrotizing fasciitis.  She remained on vent post-op.   She has hx of DM, HTN, Depression  ASSESSMENT / PLAN:  PULMONARY A: Acute respiratory failure. Acute on chronic respiratory acidosis >> ?if she has sleep disordered breathing. P:   Full vent support  F/u CXR Monitor HCO3 on BMET  CARDIOVASCULAR A:  Severe sepsis. Hx of HTN, HLD. P:  Continue IV fluids Hold outpt ASA, lisinopril, HCTZ, lopressor, procardia, pravachol  RENAL A:   AKI >> not sure what baseline renal fx  is. Hypokalemia. P:   Monitor renal fx, urine outpt Replace electrolytes as needed  GASTROINTESTINAL A:   Nutrition. P:   Tube feeds while on vent >> plan to start after surgery  Protonix for SUP  HEMATOLOGIC A:   Anemia of critical illness. P:  F/u CBC SCD's for DVT prevention >> add lovenox when okay with surgery  INFECTIOUS A:   Rt thigh necrotizing fasciitis. P:   Day 3 of vancomycin, zosyn, clindamycin Will have IV team insert PICC line >> she will likely need long term IV Abx F/u procalcitonin Post-op care per surgery  ENDOCRINE A:   DM type I. P:   SSI with lantus Hold outpt farxiga, glucophage  NEUROLOGIC A:   Post-op pain control. Hx of depression. P:   RASS goal: -1 Hold outpt prozac  CC time 38 minutes.  Coralyn Helling, MD [facility] Pulmonary/Critical Care 08/09/2015, 8:02 AM Pager:  [phone] After 3pm call: [phone]

## 2015-08-09 NOTE — Progress Notes (Signed)
1 Day Post-Op  Subjective: No acute changes overnight   Objective: Vital signs in last 24 hours: Temp:  [96.9 F (36.1 C)-99.1 F (37.3 C)] 99.1 F (37.3 C) (03/01 0411) Pulse Rate:  [85-102] 88 (03/01 0700) Resp:  [7-29] 10 (03/01 0700) BP: (75-130)/(42-89) 100/65 mmHg (03/01 0700) SpO2:  [91 %-100 %] 100 % (03/01 0700) FiO2 (%):  [60 %-100 %] 60 % (03/01 0400) Weight:  [108.9 kg (240 lb 1.3 oz)] 108.9 kg (240 lb 1.3 oz) (03/01 0300) Last BM Date: 08/08/15  Intake/Output from previous day: 02/28 0701 - 03/01 0700 In: 4665.7 [I.V.:2668.2; NG/GT:60; IV Piggyback:1937.5] Out: 2270 [Urine:1870; Emesis/NG output:400] Intake/Output this shift:    Gen: alert, cooperative RLE: bandage in place  Lab Results:   Recent Labs  08/08/15 0908 08/08/15 1137 08/09/15 0255  WBC 13.3*  --  13.0*  HGB 11.2* 11.6* 9.6*  HCT 31.9* 34.0* 28.8*  PLT 244  --  249   BMET  Recent Labs  08/08/15 1640 08/09/15 0255  NA 141 142  K 3.3* 3.2*  CL 100* 103  CO2 25 25  GLUCOSE 219* 114*  BUN 42* 47*  CREATININE 1.54* 1.56*  CALCIUM 8.5* 8.3*   PT/INR  Recent Labs  08/08/15 0908  LABPROT 15.8*  INR 1.24   ABG  Recent Labs  08/08/15 1739 08/09/15 0420  PHART 7.291* 7.299*  HCO3 27.2* 26.4*    Studies/Results: Ct Pelvis W Contrast  08/07/2015  CLINICAL DATA:  Pain, swelling and redness involving the right thigh and groin area. EXAM: CT PELVIS WITH CONTRAST TECHNIQUE: Multidetector CT imaging of the pelvis was performed using the standard protocol following the bolus administration of intravenous contrast. CONTRAST:  80mL OMNIPAQUE IOHEXOL 300 MG/ML  SOLN COMPARISON:  None. FINDINGS: There is a fairly extensive infectious process involving the right medial upper thigh and groin area extending along anterior aspect of the right pelvis. There is an extensive amount of gas in the soft tissues, inflammation/edema/fluid is skin thickening. Findings are worrisome for necrotizing  fasciitis-cellulitis. I do not see a discrete drainable abscess. There is marked inflammation involving the deep muscular fascia but I do not see any obvious myofasciitis or pyomyositis. No findings for septic arthritis or osteomyelitis. No significant intra pelvic abnormalities. Uterine fibroids are noted. Bladder is normal. There are borderline enlarged/ inflamed inguinal lymph nodes, right greater than left. Moderate atherosclerotic calcifications involving the aorta, iliac and common femoral arteries. No findings for deep venous thrombosis in the upper thighs were groin. There is an anterior abdominal wall hernia containing fat and part of the transverse colon. IMPRESSION: Aggressive appearing infection/cellulitis involving right medial upper thigh and groin area with extensive gas in the subcutaneous fat. This is likely necrotizing fasciitis/cellulitis. No discrete drainable abscess or findings for deep compartment involvement. No findings for septic arthritis or osteomyelitis. Electronically Signed   By: Rudie Meyer M.D.   On: 08/07/2015 17:56   Dg Chest Port 1 View  08/08/2015  CLINICAL DATA:  Acute respiratory failure. Endotracheal tube placement. EXAM: PORTABLE CHEST 1 VIEW COMPARISON:  08/06/2010 FINDINGS: A new endotracheal tube is seen with tip approximately 2 cm above the carina. A new nasogastric tube is seen with tip in the stomach. Improved aeration of both lungs is demonstrated. There is no evidence of pulmonary consolidation, pleural effusion, or pneumothorax. IMPRESSION: Endotracheal tube and nasogastric tube in appropriate position. Improved aeration of both lungs. No evidence of consolidation or effusion. Electronically Signed   By: Alver Sorrow.D.  On: 08/08/2015 14:31   Dg Chest Port 1 View  08/07/2015  CLINICAL DATA:  61 year old female with abscess on the leg. Preop evaluation EXAM: PORTABLE CHEST 1 VIEW COMPARISON:  Radiograph dated 08/22/2014 FINDINGS: Single portable view of the  chest demonstrate hypovolemic lungs. There is no focal consolidation, pleural effusion, or pneumothorax. Top-normal cardiac silhouette. No acute osseous pathology. IMPRESSION: No acute cardiopulmonary process. Electronically Signed   By: Elgie Collard M.D.   On: 08/07/2015 21:27    Anti-infectives: Anti-infectives    Start     Dose/Rate Route Frequency Ordered Stop   08/08/15 0600  piperacillin-tazobactam (ZOSYN) IVPB 3.375 g     3.375 g 12.5 mL/hr over 240 Minutes Intravenous Every 8 hours 08/08/15 0030     08/08/15 0600  vancomycin (VANCOCIN) 1,250 mg in sodium chloride 0.9 % 250 mL IVPB     1,250 mg 166.7 mL/hr over 90 Minutes Intravenous Every 24 hours 08/08/15 0032     08/08/15 0200  clindamycin (CLEOCIN) IVPB 600 mg     600 mg 100 mL/hr over 30 Minutes Intravenous 3 times per day 08/08/15 0023     08/08/15 0045  piperacillin-tazobactam (ZOSYN) IVPB 3.375 g     3.375 g 100 mL/hr over 30 Minutes Intravenous  Once 08/08/15 0030 08/08/15 0200   08/08/15 0030  piperacillin-tazobactam (ZOSYN) IVPB 3.375 g  Status:  Discontinued     3.375 g 12.5 mL/hr over 240 Minutes Intravenous 4 times per day 08/08/15 0023 08/08/15 0028   08/07/15 1645  piperacillin-tazobactam (ZOSYN) IVPB 3.375 g     3.375 g 100 mL/hr over 30 Minutes Intravenous  Once 08/07/15 1636 08/07/15 1715   08/07/15 1645  vancomycin (VANCOCIN) IVPB 1000 mg/200 mL premix     1,000 mg 200 mL/hr over 60 Minutes Intravenous  Once 08/07/15 1636 08/07/15 1835   08/07/15 1645  clindamycin (CLEOCIN) IVPB 600 mg     600 mg 100 mL/hr over 30 Minutes Intravenous  Once 08/07/15 1636 08/07/15 1905      Assessment/Plan: s/p Procedure(s): Irrigation and Debridement of Right Lower Ext. (Right) To OR for D&I of RLE   LOS: 2 days    Marigene Ehlers., [facility] 08/09/2015

## 2015-08-09 NOTE — Op Note (Signed)
Preoperative diagnosis: RLE necrotizing fasciitis  Postoperative diagnosis: same   Procedure: excisional debridement of full thickness tissue >10cm^2, washout and redressing  Surgeon: Feliciana Rossetti, M.D.  Asst: none  Anesthesia: General  Indications for procedure: [name] is a 60 y.o. year old female with symptoms of rash and fever and sepsis who was found to have necrotizing fascititis. She presents for second debridement today.  Description of procedure: The patient was brought into the operative suite. Anesthesia was administered with General endotracheal anesthesia. WHO checklist was applied. The patient was then placed in supine with legs abducted. The area was prepped and draped in the usual sterile fashion.  The previous dressing was removed. Grossly the wound appeared clean. In the medial aspect of the wound there was small amounts of purulence among the fat tissue. This area was completely excised in full thickness as well as all areas purulence in fat. Next multiple areas of the fat along the skin edge were excised with healthy bleeding and no evidence of deeper infection. Hemostasis was then applied with cautery and 3-0 silk sutures. A pulse lavage was used to wash the wound in its entirety, again no new areas of necrosis or purulence was seen. Once this was complete, hemostasis was again ensured and then 2 salinated kerlex tied end to end were placed in wound and covered with ABD pads. The patient then transferred back to the ICU in critical condition  Findings: medial aspect with some purulence.  Specimen: none  Implant: kerlex dressing  Blood loss:  Local anesthesia: none  Complications: none  Feliciana Rossetti, M.D. General, Bariatric, & Minimally Invasive Surgery [facility]

## 2015-08-09 NOTE — Progress Notes (Signed)
eLink Physician-Brief Progress Note Patient Name: [name] DOB: August 18, 1955 MRN: [id number]   Date of Service  08/09/2015  HPI/Events of Note  Multiple issues: 1. Hypotension - BP = 85/56 and 2. Oliguria.   eICU Interventions  Will bolus with 0.9 NaCl 1 liter IV over 1 hour now.      Intervention Category Major Interventions: Hypotension - evaluation and management Intermediate Interventions: Oliguria - evaluation and management  Jacquez Sheetz [place] 08/09/2015, 1:40 AM

## 2015-08-09 NOTE — Progress Notes (Signed)
Peripherally Inserted Central Catheter/Midline Placement  The IV Nurse has discussed with the patient and/or persons authorized to consent for the patient, the purpose of this procedure and the potential benefits and risks involved with this procedure.  The benefits include less needle sticks, lab draws from the catheter and patient may be discharged home with the catheter.  Risks include, but not limited to, infection, bleeding, blood clot (thrombus formation), and puncture of an artery; nerve damage and irregular heat beat.  Alternatives to this procedure were also discussed.  PICC/Midline Placement Documentation  PICC Triple Lumen 08/09/15 PICC Right Cephalic 41 cm 2 cm (Active)  Indication for Insertion or Continuance of Line Prolonged intravenous therapies 08/09/2015  9:00 PM  Exposed Catheter (cm) 2 cm 08/09/2015  9:00 PM  Site Assessment Clean;Dry;Intact 08/09/2015  9:00 PM  Lumen #1 Status Blood return noted;Flushed;Saline locked 08/09/2015  9:00 PM  Lumen #2 Status Blood return noted;Flushed;Saline locked 08/09/2015  9:00 PM  Lumen #3 Status Blood return noted;Flushed;Saline locked 08/09/2015  9:00 PM  Dressing Type Gauze;Occlusive 08/09/2015  9:00 PM  Dressing Status Clean;Dry;Intact;Antimicrobial disc in place 08/09/2015  9:00 PM  Dressing Intervention New dressing 08/09/2015  9:00 PM  Dressing Change Due 08/11/15 08/09/2015  9:00 PM       [name] L 08/09/2015, 9:26 PM

## 2015-08-09 NOTE — Anesthesia Preprocedure Evaluation (Signed)
Anesthesia Evaluation  Patient identified by MRN, date of birth, ID band Patient awake    Reviewed: Allergy & Precautions, NPO status , Patient's Chart, lab work & pertinent test results  Airway Mallampati: Intubated       Dental   Pulmonary Current Smoker,    Pulmonary exam normal        Cardiovascular hypertension, Pt. on medications Normal cardiovascular exam     Neuro/Psych    GI/Hepatic   Endo/Other  diabetes, Type 2, Insulin Dependent  Renal/GU      Musculoskeletal   Abdominal   Peds  Hematology   Anesthesia Other Findings   Reproductive/Obstetrics                             Anesthesia Physical Anesthesia Plan  ASA: III and emergent  Anesthesia Plan: General   Post-op Pain Management:    Induction: Intravenous  Airway Management Planned: Oral ETT  Additional Equipment:   Intra-op Plan:   Post-operative Plan: Post-operative intubation/ventilation  Informed Consent: I have reviewed the patients History and Physical, chart, labs and discussed the procedure including the risks, benefits and alternatives for the proposed anesthesia with the patient or authorized representative who has indicated his/her understanding and acceptance.     Plan Discussed with: CRNA and Surgeon  Anesthesia Plan Comments:         Anesthesia Quick Evaluation

## 2015-08-09 NOTE — Progress Notes (Signed)
Initial Nutrition Assessment  DOCUMENTATION CODES:   Morbid obesity  INTERVENTION:    If TF started, recommend:   Vital High Protein formula -- initiate at 25 ml/hr and increase by 10 ml every 4 hours to goal rate of 55 ml/hr   Prostat liquid protein 30 ml BID  Above TF regimen to provide 1520 kcals, 145 gm protein, 1104 ml of free water  NUTRITION DIAGNOSIS:   Inadequate oral intake related to inability to eat as evidenced by NPO status  GOAL:   Provide needs based on ASPEN/SCCM guidelines  MONITOR:   Vent status, Labs, Weight trends, Skin, I & O's  REASON FOR ASSESSMENT:   Ventilator  ASSESSMENT:   60 y.o. Female with 20 yr hx of IDDM, HTN, depression, obesity who presented to ED with 6 day hx of nausea, fevers and lightheadedness. She has hx of "boils" in and around her private area in the past. Now she has a lot of swelling and pain about the inner thigh on the R. She has had temps up to 102 most every day x 6 days, not able to eat much, lightheaded w standing. No abd pain, CP, no diarrhea.  Pelvis CT w/contrast 2/27 showed likely necrotizing fasciitis.  Patient s/p procedure 2/28: IRRIGATION AND DEBRIDEMENT OF RIGHT LOWER EXTREMITY (RIGHT)  Patient is currently intubated on ventilator support -- NGT in place Temp (24hrs), Avg:98.4 F (36.9 C), Min:96.9 F (36.1 C), Max:99.1 F (37.3 C)   Nutrition focused physical exam completed.  No muscle or subcutaneous fat depletion noticed.  Diet Order:  Diet NPO time specified  Skin:  Wound (see comment) (Necrotizing soft tissue infection, right lower extremity)  Last BM:  2/28  Height:   Ht Readings from Last 1 Encounters:  08/07/15  (1.6 m)    Weight:   Wt Readings from Last 1 Encounters:  08/09/15 240 lb 1.3 oz (108.9 kg)    Ideal Body Weight:  52.2 kg  BMI:  Body mass index is 42.54 kg/(m^2).  Estimated Nutritional Needs:   Kcal:  1610-9604  Protein:  140-150 gm  Fluid:  per  MD  EDUCATION NEEDS:   No education needs identified at this time  Maureen Chatters, RD, LDN Pager #: [phone] After-Hours Pager #: [phone]

## 2015-08-09 NOTE — Transfer of Care (Signed)
Immediate Anesthesia Transfer of Care Note  Patient: [name]  Procedure(s) Performed: Procedure(s): INCISION AND DRAINAGE (Right)  Patient Location: ICU  Anesthesia Type:General  Level of Consciousness: sedated and unresponsive  Airway & Oxygen Therapy: Patient remains intubated per anesthesia plan and Patient placed on Ventilator (see vital sign flow sheet for setting)  Post-op Assessment: Report given to RN and Post -op Vital signs reviewed and stable  Post vital signs: Reviewed and stable  Last Vitals:  Filed Vitals:   08/09/15 1700 08/09/15 1740  BP:  95/50  Pulse: 104 99  Temp:    Resp: 16 17    Complications: No apparent anesthesia complications

## 2015-08-10 ENCOUNTER — Encounter (HOSPITAL_COMMUNITY): Payer: Self-pay | Admitting: General Surgery

## 2015-08-10 ENCOUNTER — Inpatient Hospital Stay (HOSPITAL_COMMUNITY): Payer: BLUE CROSS/BLUE SHIELD

## 2015-08-10 DIAGNOSIS — J9602 Acute respiratory failure with hypercapnia: Secondary | ICD-10-CM

## 2015-08-10 DIAGNOSIS — J9601 Acute respiratory failure with hypoxia: Secondary | ICD-10-CM

## 2015-08-10 LAB — CBC
HCT: 28.7 % — ABNORMAL LOW (ref 36.0–46.0)
Hemoglobin: 9.1 g/dL — ABNORMAL LOW (ref 12.0–15.0)
MCH: 31.3 pg (ref 26.0–34.0)
MCHC: 31.7 g/dL (ref 30.0–36.0)
MCV: 98.6 fL (ref 78.0–100.0)
PLATELETS: 267 10*3/uL (ref 150–400)
RBC: 2.91 MIL/uL — ABNORMAL LOW (ref 3.87–5.11)
RDW: 15.7 % — AB (ref 11.5–15.5)
WBC: 13.5 10*3/uL — ABNORMAL HIGH (ref 4.0–10.5)

## 2015-08-10 LAB — GLUCOSE, CAPILLARY
GLUCOSE-CAPILLARY: 123 mg/dL — AB (ref 65–99)
GLUCOSE-CAPILLARY: 140 mg/dL — AB (ref 65–99)
GLUCOSE-CAPILLARY: 96 mg/dL (ref 65–99)
Glucose-Capillary: 114 mg/dL — ABNORMAL HIGH (ref 65–99)
Glucose-Capillary: 145 mg/dL — ABNORMAL HIGH (ref 65–99)
Glucose-Capillary: 138 mg/dL — ABNORMAL HIGH (ref 65–99)

## 2015-08-10 LAB — BASIC METABOLIC PANEL
Anion gap: 13 (ref 5–15)
BUN: 53 mg/dL — AB (ref 6–20)
CALCIUM: 8.4 mg/dL — AB (ref 8.9–10.3)
CHLORIDE: 106 mmol/L (ref 101–111)
CO2: 26 mmol/L (ref 22–32)
CREATININE: 1.93 mg/dL — AB (ref 0.44–1.00)
GFR calc non Af Amer: 27 mL/min — ABNORMAL LOW (ref >60)
GFR, EST AFRICAN AMERICAN: 31 mL/min — AB (ref >60)
Glucose, Bld: 120 mg/dL — ABNORMAL HIGH (ref 65–99)
Potassium: 4 mmol/L (ref 3.5–5.1)
SODIUM: 145 mmol/L (ref 135–145)

## 2015-08-10 LAB — CULTURE, ROUTINE-ABSCESS: CULTURE: NO GROWTH

## 2015-08-10 LAB — MAGNESIUM: MAGNESIUM: 2.6 mg/dL — AB (ref 1.7–2.4)

## 2015-08-10 LAB — PROCALCITONIN: Procalcitonin: 3.61 ng/mL

## 2015-08-10 MED ORDER — OXYCODONE HCL 5 MG PO TABS
5.0000 mg | ORAL_TABLET | ORAL | Status: DC | PRN
  Administered 2015-08-10: 5 mg via ORAL
  Administered 2015-08-11 – 2015-08-13 (×5): 10 mg via ORAL
  Filled 2015-08-10 (×5): qty 2
  Filled 2015-08-10: qty 1

## 2015-08-10 MED ORDER — MORPHINE SULFATE (PF) 2 MG/ML IV SOLN
2.0000 mg | INTRAVENOUS | Status: DC | PRN
  Administered 2015-08-10 – 2015-08-11 (×5): 4 mg via INTRAVENOUS
  Administered 2015-08-11 (×2): 2 mg via INTRAVENOUS
  Administered 2015-08-12 (×2): 4 mg via INTRAVENOUS
  Administered 2015-08-12 (×2): 2 mg via INTRAVENOUS
  Filled 2015-08-10 (×5): qty 2
  Filled 2015-08-10: qty 1
  Filled 2015-08-10: qty 2
  Filled 2015-08-10 (×2): qty 1
  Filled 2015-08-10: qty 2
  Filled 2015-08-10: qty 1

## 2015-08-10 MED ORDER — MORPHINE SULFATE (PF) 2 MG/ML IV SOLN
2.0000 mg | Freq: Once | INTRAVENOUS | Status: AC
  Administered 2015-08-10: 2 mg via INTRAVENOUS

## 2015-08-10 MED ORDER — MORPHINE SULFATE (PF) 2 MG/ML IV SOLN
2.0000 mg | INTRAVENOUS | Status: DC | PRN
  Administered 2015-08-10: 2 mg via INTRAVENOUS
  Filled 2015-08-10 (×2): qty 1

## 2015-08-10 NOTE — Progress Notes (Signed)
1 Day Post-Op  Subjective: No acute changes  Objective: Vital signs in last 24 hours: Temp:  [96 F (35.6 C)-98.8 F (37.1 C)] 98.7 F (37.1 C) (03/02 0400) Pulse Rate:  [85-110] 93 (03/02 0730) Resp:  [0-22] 9 (03/02 0730) BP: (86-142)/(49-95) 109/60 mmHg (03/02 0700) SpO2:  [94 %-100 %] 100 % (03/02 0730) FiO2 (%):  [40 %-50 %] 40 % (03/02 0400) Weight:  [113.4 kg (250 lb)] 113.4 kg (250 lb) (03/02 0200) Last BM Date: 08/08/15  Intake/Output from previous day: 03/01 0701 - 03/02 0700 In: 3988.5 [I.V.:3348.5; NG/GT:90; IV Piggyback:550] Out: 1095 [Urine:885; Emesis/NG output:200; Blood:10] Intake/Output this shift:    General appearance: alert and cooperative Incision/Wound: packed and stable  Lab Results:   Recent Labs  08/09/15 0255 08/10/15 0310  WBC 13.0* 13.5*  HGB 9.6* 9.1*  HCT 28.8* 28.7*  PLT 249 267   Micro: Results for orders placed or performed during the hospital encounter of 08/07/15  Culture, routine-abscess     Status: None (Preliminary result)   Collection Time: 08/07/15  4:40 PM  Result Value Ref Range Status   Specimen Description ABSCESS RIGHT INNER THIGH  Final   Special Requests NONE  Final   Gram Stain   Final    MODERATE WBC PRESENT,BOTH PMN AND MONONUCLEAR NO SQUAMOUS EPITHELIAL CELLS SEEN MODERATE GRAM POSITIVE COCCI IN PAIRS Performed at [facility]    Culture   Final    NO GROWTH 1 DAY Performed at [facility]    Report Status PENDING  Incomplete  MRSA PCR Screening     Status: None   Collection Time: 08/08/15 12:11 AM  Result Value Ref Range Status   MRSA by PCR NEGATIVE NEGATIVE Final    Comment:        The GeneXpert MRSA Assay (FDA approved for NASAL specimens only), is one component of a comprehensive MRSA colonization surveillance program. It is not intended to diagnose MRSA infection nor to guide or monitor treatment for MRSA infections.   Culture, routine-abscess     Status: None  (Preliminary result)   Collection Time: 08/08/15 12:21 PM  Result Value Ref Range Status   Specimen Description ABSCESS RIGHT THIGH  Final   Special Requests NONE  Final   Gram Stain   Final    FEW WBC PRESENT, PREDOMINANTLY PMN NO SQUAMOUS EPITHELIAL CELLS SEEN FEW GRAM POSITIVE COCCI IN PAIRS Performed at [facility]    Culture PENDING  Incomplete   Report Status PENDING  Incomplete  Anaerobic culture     Status: None (Preliminary result)   Collection Time: 08/08/15 12:21 PM  Result Value Ref Range Status   Specimen Description ABSCESS RIGHT THIGH  Final   Special Requests NONE  Final   Gram Stain   Final    FEW WBC PRESENT, PREDOMINANTLY PMN NO SQUAMOUS EPITHELIAL CELLS SEEN FEW GRAM POSITIVE COCCI IN PAIRS Performed at [facility]    Culture PENDING  Incomplete   Report Status PENDING  Incomplete     BMET  Recent Labs  08/09/15 0255 08/10/15 0310  NA 142 145  K 3.2* 4.0  CL 103 106  CO2 25 26  GLUCOSE 114* 120*  BUN 47* 53*  CREATININE 1.56* 1.93*  CALCIUM 8.3* 8.4*   PT/INR  Recent Labs  08/08/15 0908  LABPROT 15.8*  INR 1.24   ABG  Recent Labs  08/08/15 1739 08/09/15 0420  PHART 7.291* 7.299*  HCO3 27.2* 26.4*    Studies/Results:  Dg Chest Port 1 View  08/10/2015  CLINICAL DATA:  Intubated patient, acute respiratory failure, necrotizing fasciitis status post incision and drainage, current smoker. EXAM: PORTABLE CHEST 1 VIEW COMPARISON:  Portable chest x-ray dated August 08, 2015 FINDINGS: The lung volumes are low. There is right perihilar interstitial infiltrate. There is no pleural effusion. The cardiac silhouette remains enlarged. The central pulmonary vascularity is prominent. The endotracheal tube tip lies 2.9 cm above the carina. The esophagogastric tube tip projects below the inferior margin of the image. IMPRESSION: Persistent hypo inflation. Somewhat increased right perihilar interstitial infiltrate. Low-grade CHF,  stable. Electronically Signed   By: David  [place] M.D.   On: 08/10/2015 07:29   Dg Chest Port 1 View  08/08/2015  CLINICAL DATA:  Acute respiratory failure. Endotracheal tube placement. EXAM: PORTABLE CHEST 1 VIEW COMPARISON:  08/06/2010 FINDINGS: A new endotracheal tube is seen with tip approximately 2 cm above the carina. A new nasogastric tube is seen with tip in the stomach. Improved aeration of both lungs is demonstrated. There is no evidence of pulmonary consolidation, pleural effusion, or pneumothorax. IMPRESSION: Endotracheal tube and nasogastric tube in appropriate position. Improved aeration of both lungs. No evidence of consolidation or effusion. Electronically Signed   By: Myles Rosenthal M.D.   On: 08/08/2015 14:31    Anti-infectives: Anti-infectives    Start     Dose/Rate Route Frequency Ordered Stop   08/08/15 0600  piperacillin-tazobactam (ZOSYN) IVPB 3.375 g     3.375 g 12.5 mL/hr over 240 Minutes Intravenous Every 8 hours 08/08/15 0030     08/08/15 0600  vancomycin (VANCOCIN) 1,250 mg in sodium chloride 0.9 % 250 mL IVPB     1,250 mg 166.7 mL/hr over 90 Minutes Intravenous Every 24 hours 08/08/15 0032     08/08/15 0200  clindamycin (CLEOCIN) IVPB 600 mg     600 mg 100 mL/hr over 30 Minutes Intravenous 3 times per day 08/08/15 0023     08/08/15 0045  piperacillin-tazobactam (ZOSYN) IVPB 3.375 g     3.375 g 100 mL/hr over 30 Minutes Intravenous  Once 08/08/15 0030 08/08/15 0200   08/08/15 0030  piperacillin-tazobactam (ZOSYN) IVPB 3.375 g  Status:  Discontinued     3.375 g 12.5 mL/hr over 240 Minutes Intravenous 4 times per day 08/08/15 0023 08/08/15 0028   08/07/15 1645  piperacillin-tazobactam (ZOSYN) IVPB 3.375 g     3.375 g 100 mL/hr over 30 Minutes Intravenous  Once 08/07/15 1636 08/07/15 1715   08/07/15 1645  vancomycin (VANCOCIN) IVPB 1000 mg/200 mL premix     1,000 mg 200 mL/hr over 60 Minutes Intravenous  Once 08/07/15 1636 08/07/15 1835   08/07/15 1645  clindamycin  (CLEOCIN) IVPB 600 mg     600 mg 100 mL/hr over 30 Minutes Intravenous  Once 08/07/15 1636 08/07/15 1905      Assessment/Plan: s/p Procedure(s): INCISION AND DRAINAGE (Right) -Con't abx -dressing changes later this PM -No futher plans for OR.  OK to wean to extubation from surgery standpoint   LOS: 3 days    Marigene Ehlers., [facility] 08/10/2015

## 2015-08-10 NOTE — Progress Notes (Signed)
PULMONARY / CRITICAL CARE MEDICINE   Name: [name] MRN: [id number] DOB: 1956-04-08    ADMISSION DATE:  08/07/2015 CONSULTATION DATE:  08/08/2015  REFERRING MD:  Dr. Joseph Art, [facility]  CHIEF COMPLAINT:  Abscess  SUBJECTIVE:  BP improved.  Pain controlled in leg.  VITAL SIGNS: BP 124/78 mmHg  Pulse 103  Temp(Src) 99.1 F (37.3 C) (Axillary)  Resp 12  Ht  (1.6 m)  Wt 250 lb (113.4 kg)  BMI 44.30 kg/m2  SpO2 100%  VENTILATOR SETTINGS: Vent Mode:  [-] PRVC FiO2 (%):  [40 %-50 %] 40 % Set Rate:  [16 bmp] 16 bmp Vt Set:  [420 mL] 420 mL PEEP:  [5 cmH20] 5 cmH20 Plateau Pressure:  [17 cmH20-22 cmH20] 21 cmH20  INTAKE / OUTPUT: I/O last 3 completed shifts: In: 6851 [I.V.:4743.5; NG/GT:120; IV Piggyback:1987.5] Out: 1615 [Urine:1405; Emesis/NG output:200; Blood:10]  PHYSICAL EXAMINATION: General: sleepy Neuro:  Follows commands, moves extremities, RASS -1 HEENT:  ETT in place Cardiovascular:  Regular, no murmur Lungs:  No wheeze Abdomen:  Soft, non tender Musculoskeletal:  Rt thigh wound dressing clean Skin:  No rashes  LABS:  BMET  Recent Labs Lab 08/08/15 1640 08/09/15 0255 08/10/15 0310  NA 141 142 145  K 3.3* 3.2* 4.0  CL 100* 103 106  CO2 BUN 42* 47* 53*  CREATININE 1.54* 1.56* 1.93*  GLUCOSE 219* 114* 120*    Electrolytes  Recent Labs Lab 08/08/15 0317 08/08/15 0908  08/08/15 1640 08/09/15 0255 08/10/15 0310  CALCIUM 9.1 9.0  < > 8.5* 8.3* 8.4*  MG 2.4 2.5*  --   --   --  2.6*  < > = values in this interval not displayed.  CBC  Recent Labs Lab 08/08/15 0908 08/08/15 1137 08/09/15 0255 08/10/15 0310  WBC 13.3*  --  13.0* 13.5*  HGB 11.2* 11.6* 9.6* 9.1*  HCT 31.9* 34.0* 28.8* 28.7*  PLT 244  --  249 267    Coag's  Recent Labs Lab 08/08/15 0908  APTT 30  INR 1.24    Sepsis Markers  Recent Labs Lab 08/09/15 0854 08/10/15 0310  PROCALCITON 5.15 3.61    ABG  Recent Labs Lab 08/08/15 1739  08/09/15 0420  PHART 7.291* 7.299*  PCO2ART 56.0* 55.5*  PO2ART 66.0* 111*    Liver Enzymes  Recent Labs Lab 08/08/15 0317  AST 15  14*  ALT 12*  12*  ALKPHOS 126  106  BILITOT 0.7  0.9  ALBUMIN 2.2*  2.1*    Glucose  Recent Labs Lab 08/09/15 0756 08/09/15 1247 08/09/15 1636 08/09/15 1930 08/09/15 2359 08/10/15 0353  GLUCAP 108* 133* 133* 92 96 114*    Imaging Dg Chest [name] 1 View  08/10/2015  CLINICAL DATA:  Intubated patient, acute respiratory failure, necrotizing fasciitis status post incision and drainage, current smoker. EXAM: PORTABLE CHEST 1 VIEW COMPARISON:  Portable chest x-ray dated August 08, 2015 FINDINGS: The lung volumes are low. There is right perihilar interstitial infiltrate. There is no pleural effusion. The cardiac silhouette remains enlarged. The central pulmonary vascularity is prominent. The endotracheal tube tip lies 2.9 cm above the carina. The esophagogastric tube tip projects below the inferior margin of the image. IMPRESSION: Persistent hypo inflation. Somewhat increased right perihilar interstitial infiltrate. Low-grade CHF, stable. Electronically Signed   By: David  [place] M.D.   On: 08/10/2015 07:29     STUDIES:  2/27 CT pelvis >> infection Rt medial thigh and groin with gas in subcutaneous  tissue  CULTURES: 2/27 Rt leg wound >>   ANTIBIOTICS: 2/27 Clindamycin >> 2/27 Zosyn >> 2/27 Vancomycin >>   SIGNIFICANT EVENTS: 2/27 Admit 2/28 Surgery consulted >> I&D Rt leg  3/01 I&D Rt leg  LINES/TUBES: 2/28 ETT >> 3/02 3/01 Rt PICC >>  DISCUSSION: 60 yo female with 6 days of nausea, fever up to 102F, dizziness from Rt inner thigh abscess and necrotizing fasciitis.  She remained on vent post-op.   She has hx of DM, HTN, Depression  ASSESSMENT / PLAN:  PULMONARY A: Acute respiratory failure. Acute on chronic respiratory acidosis >> ?if she has sleep disordered breathing. P:   Extubate 3/02 Bronchial hygiene CPAP qhs  after extubation  CARDIOVASCULAR A:  Severe sepsis. Hx of HTN, HLD. P:  Continue IV fluids Hold outpt ASA, lisinopril, HCTZ, lopressor, procardia, pravachol  RENAL A:   AKI >> not sure what baseline renal fx is. Hypokalemia. P:   Monitor renal fx, urine outpt Replace electrolytes as needed  GASTROINTESTINAL A:   Nutrition. P:   Advance diet after extubation Protonix for SUP  HEMATOLOGIC A:   Anemia of critical illness. P:  F/u CBC SCD's for DVT prevention >> add lovenox when okay with surgery  INFECTIOUS A:   Rt thigh necrotizing fasciitis. P:   Day 4 of vancomycin, zosyn, clindamycin F/u procalcitonin Post-op care per surgery  ENDOCRINE A:   DM. P:   SSI with lantus Hold outpt farxiga, glucophage  NEUROLOGIC A:   Post-op pain control. Hx of depression. P:   Prn morphine Hold outpt prozac  CC time 34 minutes.  Coralyn Helling, MD [facility] Pulmonary/Critical Care 08/10/2015, 10:44 AM Pager:  [phone] After 3pm call: [phone]

## 2015-08-10 NOTE — Progress Notes (Signed)
Pt with tremors bil arms at this time; pt on vent denies pain; will cont. To monitor; MD made aware of tremors; no new orders.  [name]

## 2015-08-10 NOTE — Progress Notes (Signed)
Fent IV 175 mcg wasted with Lynetta Mare, RN.

## 2015-08-10 NOTE — Procedures (Signed)
Extubation Procedure Note  Patient Details:   Name: [name] DOB: 07-01-1955 MRN: [id number]   Airway Documentation:  Patient had a audible cuff leak prior to extubation. Patient extubated to [facility] with no complications. BS are equal, no stridor present.    Evaluation  O2 sats: stable throughout Complications: No apparent complications Patient did tolerate procedure well. Bilateral Breath Sounds: Diminished Suctioning: Airway Yes  Libby Maw 08/10/2015, 10:50 AM

## 2015-08-10 NOTE — Progress Notes (Signed)
Extubated and doing well off the Vent.  She has tremors both upper extremities she reports she has at home.  Never really been evaluated.  She is quite cognizant of what is going on and very cooperative with it.   She did very well with the dressing change, she got 2 mg Morphine IV and when I found out how little we stopped and gave her 2 mg morphine in addition to the first 2 mg.  She never complained at all during the entire dressing change.  The wound looks great she is down to muscle in some places, but everything looks good.  I do not see anything else that needs debridement at this point.   We will start we to dry dressings BID,  We can ask wound care about a Vac system, but I am concerned that this is so extensive that we will not be able to get a seal along the medial aspect between her thighs.  I am going to leave the foley in for now.  That is also going to be a difficult issue.

## 2015-08-10 NOTE — Progress Notes (Signed)
Patient placed on CPAP/PSV 5/5 with no complications. MD and nurse aware, will continue to monitor pt.

## 2015-08-10 NOTE — Progress Notes (Signed)
Pt extubated and still with bil arm tremors; MD at bedside made aware; pt denies pain; will cont. To monitor.  Benjamine Sprague

## 2015-08-11 LAB — GLUCOSE, CAPILLARY
GLUCOSE-CAPILLARY: 140 mg/dL — AB (ref 65–99)
GLUCOSE-CAPILLARY: 181 mg/dL — AB (ref 65–99)
GLUCOSE-CAPILLARY: 99 mg/dL (ref 65–99)
Glucose-Capillary: 113 mg/dL — ABNORMAL HIGH (ref 65–99)
Glucose-Capillary: 140 mg/dL — ABNORMAL HIGH (ref 65–99)
Glucose-Capillary: 147 mg/dL — ABNORMAL HIGH (ref 65–99)

## 2015-08-11 LAB — CULTURE, ROUTINE-ABSCESS

## 2015-08-11 LAB — PROCALCITONIN: PROCALCITONIN: 8.19 ng/mL

## 2015-08-11 MED ORDER — LABETALOL HCL 5 MG/ML IV SOLN
10.0000 mg | INTRAVENOUS | Status: DC | PRN
  Administered 2015-08-11 (×3): 10 mg via INTRAVENOUS
  Filled 2015-08-11 (×4): qty 4

## 2015-08-11 MED ORDER — FLUOXETINE HCL 20 MG PO CAPS
20.0000 mg | ORAL_CAPSULE | Freq: Two times a day (BID) | ORAL | Status: DC
  Administered 2015-08-11 – 2015-08-15 (×9): 20 mg via ORAL
  Filled 2015-08-11 (×9): qty 1

## 2015-08-11 MED ORDER — SODIUM CHLORIDE 0.9 % IV SOLN
INTRAVENOUS | Status: DC
  Administered 2015-08-11: 10 mL/h via INTRAVENOUS

## 2015-08-11 MED ORDER — PRAVASTATIN SODIUM 40 MG PO TABS
40.0000 mg | ORAL_TABLET | Freq: Every day | ORAL | Status: DC
  Administered 2015-08-11 – 2015-08-15 (×5): 40 mg via ORAL
  Filled 2015-08-11 (×5): qty 1

## 2015-08-11 MED ORDER — NIFEDIPINE ER OSMOTIC RELEASE 30 MG PO TB24
30.0000 mg | ORAL_TABLET | Freq: Every day | ORAL | Status: DC
  Administered 2015-08-11 – 2015-08-12 (×2): 30 mg via ORAL
  Filled 2015-08-11 (×3): qty 1

## 2015-08-11 MED ORDER — INSULIN ASPART 100 UNIT/ML ~~LOC~~ SOLN
0.0000 [IU] | Freq: Three times a day (TID) | SUBCUTANEOUS | Status: DC
  Administered 2015-08-11 – 2015-08-12 (×2): 4 [IU] via SUBCUTANEOUS
  Administered 2015-08-12: 7 [IU] via SUBCUTANEOUS
  Administered 2015-08-12 – 2015-08-13 (×2): 4 [IU] via SUBCUTANEOUS
  Administered 2015-08-13: 3 [IU] via SUBCUTANEOUS
  Administered 2015-08-13: 4 [IU] via SUBCUTANEOUS
  Administered 2015-08-15: 3 [IU] via SUBCUTANEOUS

## 2015-08-11 MED ORDER — CHLORHEXIDINE GLUCONATE 0.12 % MT SOLN
15.0000 mL | Freq: Two times a day (BID) | OROMUCOSAL | Status: DC
  Administered 2015-08-11: 15 mL via OROMUCOSAL

## 2015-08-11 MED ORDER — CETYLPYRIDINIUM CHLORIDE 0.05 % MT LIQD: 7.0000 mL | Freq: Two times a day (BID) | OROMUCOSAL | Status: DC

## 2015-08-11 MED ORDER — METOPROLOL TARTRATE 50 MG PO TABS
50.0000 mg | ORAL_TABLET | Freq: Two times a day (BID) | ORAL | Status: DC
  Administered 2015-08-11 – 2015-08-15 (×9): 50 mg via ORAL
  Filled 2015-08-11 (×9): qty 1

## 2015-08-11 MED ORDER — INSULIN ASPART 100 UNIT/ML ~~LOC~~ SOLN
0.0000 [IU] | Freq: Every day | SUBCUTANEOUS | Status: DC
  Administered 2015-08-12: 2 [IU] via SUBCUTANEOUS

## 2015-08-11 NOTE — Progress Notes (Signed)
Pt removed herself from CPAP, sats are 97% on 4L [place]. Pt confused but easily reoriented. Will leave off CPAP for now and continue to monitor closely.

## 2015-08-11 NOTE — Progress Notes (Signed)
2 Days Post-Op  Subjective: Pt doing well overnight  Objective: Vital signs in last 24 hours: Temp:  [97.7 F (36.5 C)-100.1 F (37.8 C)] 99.5 F (37.5 C) (03/03 0400) Pulse Rate:  [92-114] 92 (03/03 0800) Resp:  [0-23] 12 (03/03 0800) BP: (124-186)/(56-91) 128/56 mmHg (03/03 0800) SpO2:  [90 %-100 %] 97 % (03/03 0800) FiO2 (%):  [40 %] 40 % (03/02 1016) Weight:  [118.3 kg (260 lb 12.9 oz)] 118.3 kg (260 lb 12.9 oz) (03/03 0500) Last BM Date: 08/08/15  Intake/Output from previous day: 03/02 0701 - 03/03 0700 In: 3159.8 [P.O.:60; I.V.:2549.8; IV Piggyback:550] Out: 1150 [Urine:1100; Emesis/NG output:50] Intake/Output this shift: Total I/O In: 110 [I.V.:100; Other:10] Out: -   General appearance: alert and cooperative Incision/Wound: base of wound clean  Lab Results:   Recent Labs  08/09/15 0255 08/10/15 0310  WBC 13.0* 13.5*  HGB 9.6* 9.1*  HCT 28.8* 28.7*  PLT 249 267   BMET  Recent Labs  08/09/15 0255 08/10/15 0310  NA 142 145  K 3.2* 4.0  CL 103 106  CO2 25 26  GLUCOSE 114* 120*  BUN 47* 53*  CREATININE 1.56* 1.93*  CALCIUM 8.3* 8.4*   PT/INR  Recent Labs  08/08/15 0908  LABPROT 15.8*  INR 1.24   ABG  Recent Labs  08/08/15 1739 08/09/15 0420  PHART 7.291* 7.299*  HCO3 27.2* 26.4*    Studies/Results: Dg Chest Port 1 View  08/10/2015  CLINICAL DATA:  Intubated patient, acute respiratory failure, necrotizing fasciitis status post incision and drainage, current smoker. EXAM: PORTABLE CHEST 1 VIEW COMPARISON:  Portable chest x-ray dated August 08, 2015 FINDINGS: The lung volumes are low. There is right perihilar interstitial infiltrate. There is no pleural effusion. The cardiac silhouette remains enlarged. The central pulmonary vascularity is prominent. The endotracheal tube tip lies 2.9 cm above the carina. The esophagogastric tube tip projects below the inferior margin of the image. IMPRESSION: Persistent hypo inflation. Somewhat increased  right perihilar interstitial infiltrate. Low-grade CHF, stable. Electronically Signed   By: David  [place] M.D.   On: 08/10/2015 07:29    Anti-infectives: Anti-infectives    Start     Dose/Rate Route Frequency Ordered Stop   08/08/15 0600  piperacillin-tazobactam (ZOSYN) IVPB 3.375 g     3.375 g 12.5 mL/hr over 240 Minutes Intravenous Every 8 hours 08/08/15 0030     08/08/15 0600  vancomycin (VANCOCIN) 1,250 mg in sodium chloride 0.9 % 250 mL IVPB     1,250 mg 166.7 mL/hr over 90 Minutes Intravenous Every 24 hours 08/08/15 0032     08/08/15 0200  clindamycin (CLEOCIN) IVPB 600 mg     600 mg 100 mL/hr over 30 Minutes Intravenous 3 times per day 08/08/15 0023     08/08/15 0045  piperacillin-tazobactam (ZOSYN) IVPB 3.375 g     3.375 g 100 mL/hr over 30 Minutes Intravenous  Once 08/08/15 0030 08/08/15 0200   08/08/15 0030  piperacillin-tazobactam (ZOSYN) IVPB 3.375 g  Status:  Discontinued     3.375 g 12.5 mL/hr over 240 Minutes Intravenous 4 times per day 08/08/15 0023 08/08/15 0028   08/07/15 1645  piperacillin-tazobactam (ZOSYN) IVPB 3.375 g     3.375 g 100 mL/hr over 30 Minutes Intravenous  Once 08/07/15 1636 08/07/15 1715   08/07/15 1645  vancomycin (VANCOCIN) IVPB 1000 mg/200 mL premix     1,000 mg 200 mL/hr over 60 Minutes Intravenous  Once 08/07/15 1636 08/07/15 1835   08/07/15 1645  clindamycin (CLEOCIN) IVPB  600 mg     600 mg 100 mL/hr over 30 Minutes Intravenous  Once 08/07/15 1636 08/07/15 1905      Assessment/Plan: s/p Procedure(s): INCISION AND DRAINAGE (Right) Con't dressng changes TID Adv diet as tol Ok for trx out of ICU from surgical standpoint. Pt originally admitted to Medicine service  LOS: 4 days    Marigene Ehlersamirez Jr., [facility] 08/11/2015

## 2015-08-11 NOTE — Care Management Note (Signed)
Case Management Note  Patient Details  Name: Sophia EllisonGenevieve A [name] MRN: [id number] Date of Birth: 1955/12/15  Subjective/Objective:   Pt lives with friend and indicates he will be available to assist her as needed when she is medically stable for discharge.                         Expected Discharge Plan:  Home w Home Health Services  Discharge planning Services  CM Consult  Status of Service:  In process, will continue to follow  Magdalene RiverMayo, Mersadie Kavanaugh T, RN 08/11/2015, 1:30 PM

## 2015-08-11 NOTE — Progress Notes (Signed)
Pharmacy Antibiotic Note  [name] is a 60 y.o. female admitted on 08/07/2015 with necrotizing fascitis of her right leg. She underwent I&D, washout on 3/1. She continues on day #5 vancomycin, zosyn, and clindamycin.  Renal function is worsening, sCr 1.56 --> 1.93 with CrCl ~5139ml/min.  Plan: 1) Continue vancomycin 1250mg  IV q24 - check trough tomorrow prior to 0600 dose 2) Continue zosyn 3.375g IV q8 3) Continue clindamycin 600mg  IV q8  Height: 5\' 3"  (160 cm) Weight: 260 lb 12.9 oz (118.3 kg) IBW/kg (Calculated) : 52.4  Temp (24hrs), Avg:99.2 F (37.3 C), Min:97.7 F (36.5 C), Max:100.1 F (37.8 C)   Recent Labs Lab 08/07/15 1247 08/08/15 0317 08/08/15 0908 08/08/15 1355 08/08/15 1640 08/09/15 0255 08/10/15 0310  WBC 15.8* 15.5* 13.3*  --   --  13.0* 13.5*  CREATININE 1.66* 1.71* 1.57* 1.41* 1.54* 1.56* 1.93*    Estimated Creatinine Clearance: 38.6 mL/min (by C-G formula based on Cr of 1.93).    Allergies  Allergen Reactions  . Penicillins Rash    Has patient had a PCN reaction causing immediate rash, facial/tongue/throat swelling, SOB or lightheadedness with hypotension: Yes Has patient had a PCN reaction causing severe rash involving mucus membranes or skin necrosis: No Has patient had a PCN reaction that required hospitalization No Has patient had a PCN reaction occurring within the last 10 years: No If all of the above answers are "NO", then may proceed with Cephalosporin use.     Antimicrobials this admission: 2/27 Clindamycin >> 2/27 Zosyn >> 2/27 Vancomycin >>   Dose adjustments this admission: n/a  Microbiology results: 2/27 Rt leg wound >>  few GPC  Thank you for allowing pharmacy to be a part of this patient's care.  [name], [name] 08/11/2015 10:55 AM

## 2015-08-11 NOTE — Progress Notes (Signed)
PULMONARY / CRITICAL CARE MEDICINE   Name: [name] MRN: [id number] DOB: 04-16-1956    ADMISSION DATE:  08/07/2015 CONSULTATION DATE:  08/08/2015  REFERRING MD:  Dr. Joseph Art, [facility]  CHIEF COMPLAINT:  Abscess  SUBJECTIVE:  Denies chest pain.  Leg pain controlled.  VITAL SIGNS: BP 157/71 mmHg  Pulse 98  Temp(Src) 98.3 F (36.8 C) (Oral)  Resp 14  Ht  (1.6 m)  Wt 260 lb 12.9 oz (118.3 kg)  BMI 46.21 kg/m2  SpO2 99%  INTAKE / OUTPUT: I/O last 3 completed shifts: In: 5012.4 [P.O.:60; I.V.:3934.9; NG/GT:30; IV Piggyback:987.5] Out: 1685 [Urine:1535; Emesis/NG output:150]  PHYSICAL EXAMINATION: General: alert Neuro:  Follows commands, moves extremities HEENT: no stridor Cardiovascular:  Regular, no murmur Lungs:  No wheeze Abdomen:  Soft, non tender Musculoskeletal:  Rt thigh wound dressing clean Skin:  No rashes  LABS:  BMET  Recent Labs Lab 08/08/15 1640 08/09/15 0255 08/10/15 0310  NA 141 142 145  K 3.3* 3.2* 4.0  CL 100* 103 106  CO2 BUN 42* 47* 53*  CREATININE 1.54* 1.56* 1.93*  GLUCOSE 219* 114* 120*    Electrolytes  Recent Labs Lab 08/08/15 0317 08/08/15 0908  08/08/15 1640 08/09/15 0255 08/10/15 0310  CALCIUM 9.1 9.0  < > 8.5* 8.3* 8.4*  MG 2.4 2.5*  --   --   --  2.6*  < > = values in this interval not displayed.  CBC  Recent Labs Lab 08/08/15 0908 08/08/15 1137 08/09/15 0255 08/10/15 0310  WBC 13.3*  --  13.0* 13.5*  HGB 11.2* 11.6* 9.6* 9.1*  HCT 31.9* 34.0* 28.8* 28.7*  PLT 244  --  249 267    Coag's  Recent Labs Lab 08/08/15 0908  APTT 30  INR 1.24    Sepsis Markers  Recent Labs Lab 08/09/15 0854 08/10/15 0310 08/11/15 0418  PROCALCITON 5.15 3.61 8.19    ABG  Recent Labs Lab 08/08/15 1739 08/09/15 0420  PHART 7.291* 7.299*  PCO2ART 56.0* 55.5*  PO2ART 66.0* 111*    Liver Enzymes  Recent Labs Lab 08/08/15 0317  AST 15  14*  ALT 12*  12*  ALKPHOS 126  106  BILITOT  0.7  0.9  ALBUMIN 2.2*  2.1*    Glucose  Recent Labs Lab 08/10/15 1239 08/10/15 1622 08/10/15 1920 08/11/15 0009 08/11/15 0408 08/11/15 0920  GLUCAP 145* 138* 123* 99 113* 140*    Imaging No results found.   STUDIES:  2/27 CT pelvis >> infection Rt medial thigh and groin with gas in subcutaneous tissue  CULTURES: 2/27 Rt leg wound >>   ANTIBIOTICS: 2/27 Clindamycin >> 2/27 Zosyn >> 2/27 Vancomycin >>   SIGNIFICANT EVENTS: 2/27 Admit 2/28 Surgery consulted >> I&D Rt leg  3/01 I&D Rt leg 3/03 To floor bed  LINES/TUBES: 2/28 ETT >> 3/02 3/01 Rt PICC >>  DISCUSSION: 60 yo female with 6 days of nausea, fever up to 102F, dizziness from Rt inner thigh abscess and necrotizing fasciitis.  She remained on vent post-op.   She has hx of DM, HTN, Depression  ASSESSMENT / PLAN:  PULMONARY A: Acute respiratory failure >> resolved. P:   Bronchial hygiene  CARDIOVASCULAR A:  Severe sepsis >> resolved. Hx of HTN, HLD. P:  KVO IV fluids Resume outpt lopressor, procardia, pravachol 3/03 Hold ASA, HCTZ, lisinopril for now  RENAL A:   AKI >> not sure what baseline renal fx is. Hypokalemia. P:   Monitor renal fx, urine  outpt Replace electrolytes as needed  GASTROINTESTINAL A:   Nutrition. P:   Advance diet as tolerated D/c protonix >> SUP no longer indicated  HEMATOLOGIC A:   Anemia of critical illness. P:  F/u CBC SCD's for DVT prevention >> add lovenox when okay with surgery  INFECTIOUS A:   Rt thigh necrotizing fasciitis. P:   Day 5 of vancomycin, zosyn, clindamycin F/u procalcitonin Post-op care per surgery  ENDOCRINE A:   DM. P:   SSI with lantus Hold outpt farxiga, glucophage  NEUROLOGIC A:   Post-op pain control. Hx of depression. P:   Prn morphine Resume outpt prozac 3/03  Transfer to floor bed 3/03 >> [facility] to resume care 3/04 and PCCM off.  Coralyn HellingVineet Rania Prothero, MD [facility]Critical [facility] 08/11/2015, 9:58 AM Pager:   [phone] After 3pm call: [phone]

## 2015-08-11 NOTE — Anesthesia Postprocedure Evaluation (Signed)
Anesthesia Post Note  Patient: [name]  Procedure(s) Performed: Procedure(s) (LRB): INCISION AND DRAINAGE (Right)  Patient location during evaluation: SICU Anesthesia Type: General Level of consciousness: sedated Pain management: pain level controlled Vital Signs Assessment: post-procedure vital signs reviewed and stable Respiratory status: patient remains intubated per anesthesia plan Cardiovascular status: stable Anesthetic complications: no    Last Vitals:  Filed Vitals:   08/11/15 1300 08/11/15 1335  BP: 148/74 139/62  Pulse: 86 86  Temp:  36.8 C  Resp: 18 17    Last Pain:  Filed Vitals:   08/11/15 1433  PainSc: 8                  Iola Turri,W. EDMOND

## 2015-08-11 NOTE — Progress Notes (Signed)
eLink Physician-Brief Progress Note Patient Name: [name] DOB: Jan 16, 1956 MRN: [id number]   Date of Service  08/11/2015  HPI/Events of Note  Hypertension with BP 173/90 (112) and HR of 109.  eICU Interventions  Plan: PRN labetalol 10 mg IV q2 hours for SBP greater than 170 mmHg     Intervention Category Intermediate Interventions: Hypertension - evaluation and management  DETERDING,ELIZABETH 08/11/2015, 2:44 AM

## 2015-08-12 LAB — CBC
HEMATOCRIT: 28.4 % — AB (ref 36.0–46.0)
HEMOGLOBIN: 9.2 g/dL — AB (ref 12.0–15.0)
MCH: 32.7 pg (ref 26.0–34.0)
MCHC: 32.4 g/dL (ref 30.0–36.0)
MCV: 101.1 fL — ABNORMAL HIGH (ref 78.0–100.0)
Platelets: 267 10*3/uL (ref 150–400)
RBC: 2.81 MIL/uL — AB (ref 3.87–5.11)
RDW: 16.2 % — ABNORMAL HIGH (ref 11.5–15.5)
WBC: 13.9 10*3/uL — AB (ref 4.0–10.5)

## 2015-08-12 LAB — BASIC METABOLIC PANEL
Anion gap: 8 (ref 5–15)
BUN: 30 mg/dL — AB (ref 6–20)
CHLORIDE: 113 mmol/L — AB (ref 101–111)
CO2: 27 mmol/L (ref 22–32)
Calcium: 8.6 mg/dL — ABNORMAL LOW (ref 8.9–10.3)
Creatinine, Ser: 1.06 mg/dL — ABNORMAL HIGH (ref 0.44–1.00)
GFR calc Af Amer: >60 mL/min (ref >60)
GFR calc non Af Amer: 56 mL/min — ABNORMAL LOW (ref >60)
GLUCOSE: 179 mg/dL — AB (ref 65–99)
POTASSIUM: 3.9 mmol/L (ref 3.5–5.1)
Sodium: 148 mmol/L — ABNORMAL HIGH (ref 135–145)

## 2015-08-12 LAB — GLUCOSE, CAPILLARY
GLUCOSE-CAPILLARY: 171 mg/dL — AB (ref 65–99)
GLUCOSE-CAPILLARY: 217 mg/dL — AB (ref 65–99)
Glucose-Capillary: 189 mg/dL — ABNORMAL HIGH (ref 65–99)
Glucose-Capillary: 206 mg/dL — ABNORMAL HIGH (ref 65–99)

## 2015-08-12 LAB — PROCALCITONIN: PROCALCITONIN: 0.56 ng/mL

## 2015-08-12 LAB — VANCOMYCIN, TROUGH: VANCOMYCIN TR: 12 ug/mL (ref 10.0–20.0)

## 2015-08-12 MED ORDER — MORPHINE SULFATE (PF) 2 MG/ML IV SOLN
2.0000 mg | INTRAVENOUS | Status: DC | PRN
  Administered 2015-08-12: 4 mg via INTRAVENOUS
  Administered 2015-08-13: 2 mg via INTRAVENOUS
  Administered 2015-08-13 – 2015-08-14 (×3): 4 mg via INTRAVENOUS
  Filled 2015-08-12: qty 2
  Filled 2015-08-12: qty 1
  Filled 2015-08-12 (×4): qty 2

## 2015-08-12 MED ORDER — NIFEDIPINE ER 60 MG PO TB24
60.0000 mg | ORAL_TABLET | Freq: Every day | ORAL | Status: DC
  Administered 2015-08-13 – 2015-08-15 (×3): 60 mg via ORAL
  Filled 2015-08-12 (×5): qty 1

## 2015-08-12 MED ORDER — VANCOMYCIN HCL 500 MG IV SOLR
250.0000 mg | Freq: Once | INTRAVENOUS | Status: DC
  Filled 2015-08-12: qty 250

## 2015-08-12 MED ORDER — HYDRALAZINE HCL 20 MG/ML IJ SOLN
10.0000 mg | INTRAMUSCULAR | Status: DC | PRN
  Administered 2015-08-14: 10 mg via INTRAVENOUS
  Filled 2015-08-12: qty 1

## 2015-08-12 MED ORDER — INSULIN GLARGINE 100 UNIT/ML ~~LOC~~ SOLN
14.0000 [IU] | Freq: Two times a day (BID) | SUBCUTANEOUS | Status: DC
  Administered 2015-08-12 – 2015-08-13 (×2): 14 [IU] via SUBCUTANEOUS
  Filled 2015-08-12 (×3): qty 0.14

## 2015-08-12 MED ORDER — SODIUM CHLORIDE 0.45 % IV SOLN
INTRAVENOUS | Status: DC
  Administered 2015-08-12 – 2015-08-13 (×2): via INTRAVENOUS

## 2015-08-12 MED ORDER — INSULIN GLARGINE 100 UNIT/ML ~~LOC~~ SOLN
16.0000 [IU] | Freq: Two times a day (BID) | SUBCUTANEOUS | Status: DC
  Filled 2015-08-12: qty 0.16

## 2015-08-12 MED ORDER — VANCOMYCIN HCL 10 G IV SOLR: 1500.0000 mg | INTRAVENOUS | Status: DC

## 2015-08-12 MED ORDER — SODIUM CHLORIDE 0.9% FLUSH
10.0000 mL | INTRAVENOUS | Status: DC | PRN
  Administered 2015-08-12: 10 mL
  Filled 2015-08-12: qty 40

## 2015-08-12 MED ORDER — ENOXAPARIN SODIUM 60 MG/0.6ML ~~LOC~~ SOLN
55.0000 mg | SUBCUTANEOUS | Status: DC
  Administered 2015-08-12 – 2015-08-15 (×4): 55 mg via SUBCUTANEOUS
  Filled 2015-08-12 (×4): qty 0.6

## 2015-08-12 NOTE — Progress Notes (Signed)
Pharmacy Antibiotic Note  Sophia EllisonGenevieve A [name] is a [age] y.o. female admitted on 08/07/2015 with necrotizing fascitis of her right leg. She underwent I&D, washout on 3/1. She continues on day #6 vancomycin, Zosyn, and clindamycin.  Renal function has significantly improved with SCr down to 1.06, UOP 1.3 ml/kg/hr. Vancomycin trough is below goal at 12.   Plan: 1) Increase vancomycin to 1500 mg IV q24h 2) Continue Zosyn 3.375g IV q8h 3) Continue clindamycin 600mg  IV q8h 4) Monitor renal function, clinical progress, and culture data 5) F/U LOT   Height: 5\' 3"  (160 cm) Weight: 254 lb 9.6 oz (115.486 kg) IBW/kg (Calculated) : 52.4  Temp (24hrs), Avg:98.4 F (36.9 C), Min:98.3 F (36.8 C), Max:98.6 F (37 C)   Recent Labs Lab 08/08/15 0317 08/08/15 0908 08/08/15 1355 08/08/15 1640 08/09/15 0255 08/10/15 0310 08/12/15 0525  WBC 15.5* 13.3*  --   --  13.0* 13.5* 13.9*  CREATININE 1.71* 1.57* 1.41* 1.54* 1.56* 1.93* 1.06*  VANCOTROUGH  --   --   --   --   --   --  12    Estimated Creatinine Clearance: 69.1 mL/min (by C-G formula based on Cr of 1.06).    Allergies  Allergen Reactions  . Penicillins Rash    Has patient had a PCN reaction causing immediate rash, facial/tongue/throat swelling, SOB or lightheadedness with hypotension: Yes Has patient had a PCN reaction causing severe rash involving mucus membranes or skin necrosis: No Has patient had a PCN reaction that required hospitalization No Has patient had a PCN reaction occurring within the last 10 years: No If all of the above answers are "NO", then may proceed with Cephalosporin use.     Antimicrobials this admission: 2/27 Clindamycin >> 2/27 Zosyn >> 2/27 Vancomycin >>   Dose adjustments this admission: 3/4 VT 12 on 1.25g q24h - incr 1.5g q24h  Microbiology results: 2/27 Rt leg wound >>  1 of 2 few CoNS  Thank you for allowing pharmacy to be a part of this patient's care.  BuffaloJennifer Carson, 17[address] Rainbow BoulevardPharm.D.,  BCPS Clinical Pharmacist Pager: [phone] 08/12/2015 7:22 AM

## 2015-08-12 NOTE — Progress Notes (Signed)
Progress Note: General Surgery Service   Subjective: Feels "a whole lot better". Tolerating diet  Objective: Vital signs in last 24 hours: Temp:  [98.3 F (36.8 C)-98.6 F (37 C)] 98.4 F (36.9 C) (03/04 0547) Pulse Rate:  [84-96] 87 (03/04 0547) Resp:  [13-20] 18 (03/04 0547) BP: (139-168)/(52-112) 139/52 mmHg (03/04 0547) SpO2:  [92 %-100 %] 100 % (03/04 0547) Weight:  [115.486 kg (254 lb 9.6 oz)] 115.486 kg (254 lb 9.6 oz) (03/04 0547) Last BM Date: 08/11/15  Intake/Output from previous day: 03/03 0701 - 03/04 0700 In: 423.7 [I.V.:313.7; IV Piggyback:100] Out: 3726 [Urine:3725; Stool:1] Intake/Output this shift: Total I/O In: 717 [P.O.:240; I.V.:108; IV Piggyback:369] Out: -   Lungs: CTAB  Cardiovascular: RRR  Abd: soft, NT, ND  Extremities: right wound with clean base, no evidence of purulence  Neuro: AOx4  Lab Results: CBC   Recent Labs  08/10/15 0310 08/12/15 0525  WBC 13.5* 13.9*  HGB 9.1* 9.2*  HCT 28.7* 28.4*  PLT 267 267   BMET  Recent Labs  08/10/15 0310 08/12/15 0525  NA 145 148*  K 4.0 3.9  CL 106 113*  CO2 26 27  GLUCOSE 120* 179*  BUN 53* 30*  CREATININE 1.93* 1.06*  CALCIUM 8.4* 8.6*   PT/INR No results for input(s): LABPROT, INR in the last 72 hours. ABG No results for input(s): PHART, HCO3 in the last 72 hours.  Invalid input(s): PCO2, PO2  Studies/Results:  Anti-infectives: Anti-infectives    Start     Dose/Rate Route Frequency Ordered Stop   08/13/15 0200  vancomycin (VANCOCIN) 1,500 mg in sodium chloride 0.9 % 500 mL IVPB     1,500 mg 250 mL/hr over 120 Minutes Intravenous Every 24 hours 08/12/15 0713     08/12/15 0745  vancomycin (VANCOCIN) 250 mg in sodium chloride 0.9 % 100 mL IVPB  Status:  Discontinued     250 mg 100 mL/hr over 60 Minutes Intravenous  Once 08/12/15 0727 08/12/15 0735   08/08/15 0600  piperacillin-tazobactam (ZOSYN) IVPB 3.375 g     3.375 g 12.5 mL/hr over 240 Minutes Intravenous Every 8  hours 08/08/15 0030     08/08/15 0600  vancomycin (VANCOCIN) 1,250 mg in sodium chloride 0.9 % 250 mL IVPB  Status:  Discontinued     1,250 mg 166.7 mL/hr over 90 Minutes Intravenous Every 24 hours 08/08/15 0032 08/12/15 0713   08/08/15 0200  clindamycin (CLEOCIN) IVPB 600 mg     600 mg 100 mL/hr over 30 Minutes Intravenous 3 times per day 08/08/15 0023     08/08/15 0045  piperacillin-tazobactam (ZOSYN) IVPB 3.375 g     3.375 g 100 mL/hr over 30 Minutes Intravenous  Once 08/08/15 0030 08/08/15 0200   08/08/15 0030  piperacillin-tazobactam (ZOSYN) IVPB 3.375 g  Status:  Discontinued     3.375 g 12.5 mL/hr over 240 Minutes Intravenous 4 times per day 08/08/15 0023 08/08/15 0028   08/07/15 1645  piperacillin-tazobactam (ZOSYN) IVPB 3.375 g     3.375 g 100 mL/hr over 30 Minutes Intravenous  Once 08/07/15 1636 08/07/15 1715   08/07/15 1645  vancomycin (VANCOCIN) IVPB 1000 mg/200 mL premix     1,000 mg 200 mL/hr over 60 Minutes Intravenous  Once 08/07/15 1636 08/07/15 1835   08/07/15 1645  clindamycin (CLEOCIN) IVPB 600 mg     600 mg 100 mL/hr over 30 Minutes Intravenous  Once 08/07/15 1636 08/07/15 1905      Medications: Scheduled Meds: . clindamycin (CLEOCIN) IV  600 mg Intravenous 3 times per day  . FLUoxetine  20 mg Oral BID  . insulin aspart  0-20 Units Subcutaneous TID WC  . insulin aspart  0-5 Units Subcutaneous QHS  . insulin glargine  10 Units Subcutaneous BID  . metoprolol  50 mg Oral BID  . NIFEdipine  30 mg Oral Daily  . piperacillin-tazobactam (ZOSYN)  IV  3.375 g Intravenous Q8H  . pravastatin  40 mg Oral Daily  . sodium chloride flush  10-40 mL Intracatheter Q12H  . [START ON 08/13/2015] vancomycin  1,500 mg Intravenous Q24H   Continuous Infusions: . sodium chloride 10 mL/hr (08/11/15 1048)   PRN Meds:.acetaminophen (TYLENOL) oral liquid 160 mg/5 mL, labetalol, morphine injection, [DISCONTINUED] ondansetron **OR** ondansetron (ZOFRAN) IV, oxyCODONE, sodium chloride  flush  Assessment/Plan: Patient Active Problem List   Diagnosis Date Noted  . Acute respiratory failure (HCC)   . Endotracheal tube present   . Necrotizing fasciitis (HCC) of inner thigh 08/07/2015  . IDDM (insulin dependent diabetes mellitus) (HCC) 08/07/2015  . History of hypertension 08/07/2015  . Hypotension 08/07/2015  . Acute renal insufficiency 08/07/2015  . Preop testing    s/p Procedure(s): INCISION AND DRAINAGE 08/09/2015, cultures with coag neg staph -stop vanc, continue double coverage with zosyn/clinda at this time -continue TID dressing changes -continue glucose control   LOS: 5 days   Rodman PickleLuke Aaron Isadore Bokhari, MD Pg# [phone] [facility]

## 2015-08-12 NOTE — Progress Notes (Signed)
[place] TEAM 1 - Stepdown/ICU TEAM PROGRESS NOTE  Barrington EllisonGenevieve A Singley ZOX:[id number] DOB: 08/16/1955 DOA: 08/07/2015 PCP: Louie BostonAPPER,[name], MD  Admit HPI / Brief Narrative: 60 y.o. F Hx DM, HTN, Depression, and Obesity who presented to the ED 2/27 with a 6 day hx of nausea, fevers and lightheadedness w/ a lot of swelling and pain of the R inner thigh. She has a hx of "boils" in and around her groin in the past. She had temps up to 102 most every day x 6 days, and was not able to eat much.    In the ED WBC 15.8, temp 98.9, BP 99/45 and O2 95% on 2L [place]. Patient found to have large amount swelling R upper / medial thigh. CT w contrast showed "aggressive infection/ cellulitis involving R medial upper thigh and groin area w extensive gas in the SQ fat....likely necrotizing fasciitis." [facility] by Gen Surgery.   Significant Events: 2/27 Admit 3/01 I&D R leg - intubated 3/01 R PICC >  3/02 extubated  3/01 I&D R leg 3/03 To floor bed  HPI/Subjective: The pt is resting comfortably.  She denies cp, n/v, abdom pain, or HA.  She feels that she is improving.    Assessment/Plan:  Coag neg Staph Necrotizing fasciitis Rt thigh w/ severe sepsis  Now s/p I&D - remains on dual abx tx - wound care per Gen Surgery   Anemia of critical illness Hgb stable at ~9 - no indication of acute blood loss  Hypernatremia  Encourage free water - short term 1/2 NS IVF - follow   HTN BP poorly controlled at this time - adjust medical tx and follow   DM2  CBG not yet at goal - adjust tx and follow trend - A1c 9.8 08/08/15  Acute renal insuff  No baseline labs in system - crt improving w/ volume/resolution of sepsis - follow trend - hold ACE for now   Hypokalemia Corrected   Morbid obesity - Body mass index is 45.11 kg/(m^2).  Code Status: FULL Family Communication: no family present at time of exam Disposition Plan: med bed - wound care per Gen Surg    Consultants: Gen Surgery  PCCM   Antibiotics: Clindamycin 2/27 > Zosyn 2/27 > Vancomycin 2/27 > 3/03  DVT prophylaxis: SCDs  Objective: Blood pressure 161/94, pulse 82, temperature 98.6 F (37 C), temperature source Oral, resp. rate 18, height 5\' 3"  (1.6 m), weight 115.486 kg (254 lb 9.6 oz), SpO2 100 %.  Intake/Output Summary (Last 24 hours) at 08/12/15 1555 Last data filed at 08/12/15 1500  Gross per 24 hour  Intake   1487 ml  Output   2175 ml  Net   -688 ml   Exam: General: No acute respiratory distress Lungs: Clear to auscultation bilaterally without wheezes or crackles Cardiovascular: Regular rate and rhythm without murmur gallop or rub normal S1 and S2 Abdomen: Nontender, obese, soft, bowel sounds positive, no rebound, no ascites, no appreciable mass Extremities: No significant cyanosis, clubbing, or edema bilateral lower extremities  Data Reviewed: Basic Metabolic Panel:  Recent Labs Lab 08/08/15 0317 08/08/15 0908  08/08/15 1355 08/08/15 1640 08/09/15 0255 08/10/15 0310 08/12/15 0525  NA 139 140  < > 141 141 142 145 148*  K 2.9* 2.8*  < > 3.1* 3.3* 3.2* 4.0 3.9  CL 96* 99*  --  102 100* 103 106 113*  CO2 26 28  --  26 25 25 26 27   GLUCOSE 288*  195*  < > 174* 219* 114* 120* 179*  BUN 42* 42*  --  40* 42* 47* 53* 30*  CREATININE 1.71* 1.57*  --  1.41* 1.54* 1.56* 1.93* 1.06*  CALCIUM 9.1 9.0  --  8.5* 8.5* 8.3* 8.4* 8.6*  MG 2.4 2.5*  --   --   --   --  2.6*  --   < > = values in this interval not displayed.  CBC:  Recent Labs Lab 08/07/15 1247 08/08/15 0317 08/08/15 0908 08/08/15 1137 08/09/15 0255 08/10/15 0310 08/12/15 0525  WBC 15.8* 15.5* 13.3*  --  13.0* 13.5* 13.9*  NEUTROABS 13.6*  --   --   --   --   --   --   HGB 12.9 11.2* 11.2* 11.6* 9.6* 9.1* 9.2*  HCT 36.7 32.2* 31.9* 34.0* 28.8* 28.7* 28.4*  MCV 95.3 94.7 94.4  --  94.4 98.6 101.1*  PLT 246 242 244  --  249 267 267    Liver Function Tests:  Recent Labs Lab  08/08/15 0317  AST 15  14*  ALT 12*  12*  ALKPHOS 126  106  BILITOT 0.7  0.9  PROT 6.6  6.6  ALBUMIN 2.2*  2.1*   Coags:  Recent Labs Lab 08/08/15 0908  INR 1.24    Recent Labs Lab 08/08/15 0908  APTT 30   CBG:  Recent Labs Lab 08/11/15 1236 08/11/15 1716 08/11/15 2127 08/12/15 0814 08/12/15 1151  GLUCAP 181* 140* 147* 171* 217*    Recent Results (from the past 240 hour(s))  Culture, routine-abscess     Status: None   Collection Time: 08/07/15  4:40 PM  Result Value Ref Range Status   Specimen Description ABSCESS RIGHT INNER THIGH  Final   Special Requests NONE  Final   Gram Stain   Final    MODERATE WBC PRESENT,BOTH PMN AND MONONUCLEAR NO SQUAMOUS EPITHELIAL CELLS SEEN MODERATE GRAM POSITIVE COCCI IN PAIRS Performed at [facility]    Culture   Final    NO GROWTH 2 DAYS Performed at [facility]    Report Status 08/10/2015 FINAL  Final  MRSA PCR Screening     Status: None   Collection Time: 08/08/15 12:11 AM  Result Value Ref Range Status   MRSA by PCR NEGATIVE NEGATIVE Final    Comment:        The GeneXpert MRSA Assay (FDA approved for NASAL specimens only), is one component of a comprehensive MRSA colonization surveillance program. It is not intended to diagnose MRSA infection nor to guide or monitor treatment for MRSA infections.   Culture, routine-abscess     Status: None   Collection Time: 08/08/15 12:21 PM  Result Value Ref Range Status   Specimen Description ABSCESS RIGHT THIGH  Final   Special Requests NONE  Final   Gram Stain   Final    FEW WBC PRESENT, PREDOMINANTLY PMN NO SQUAMOUS EPITHELIAL CELLS SEEN FEW GRAM POSITIVE COCCI IN PAIRS Performed at [facility]    Culture   Final    FEW STAPHYLOCOCCUS SPECIES (COAGULASE NEGATIVE) Culture reincubated for better growth Performed at [facility]    Report Status 08/11/2015 FINAL  Final  Anaerobic culture     Status: None (Preliminary  result)   Collection Time: 08/08/15 12:21 PM  Result Value Ref Range Status   Specimen Description ABSCESS RIGHT THIGH  Final   Special Requests NONE  Final   Gram Stain   Final  FEW WBC PRESENT, PREDOMINANTLY PMN NO SQUAMOUS EPITHELIAL CELLS SEEN FEW GRAM POSITIVE COCCI IN PAIRS Performed at [facility]    Culture   Final    NO ANAEROBES ISOLATED; CULTURE IN PROGRESS FOR 5 DAYS Performed at [facility]    Report Status PENDING  Incomplete     Studies:   Recent x-ray studies have been reviewed in detail by the Attending Physician  Scheduled Meds:  Scheduled Meds: . clindamycin (CLEOCIN) IV  600 mg Intravenous 3 times per day  . FLUoxetine  20 mg Oral BID  . insulin aspart  0-20 Units Subcutaneous TID WC  . insulin aspart  0-5 Units Subcutaneous QHS  . insulin glargine  10 Units Subcutaneous BID  . metoprolol  50 mg Oral BID  . NIFEdipine  30 mg Oral Daily  . piperacillin-tazobactam (ZOSYN)  IV  3.375 g Intravenous Q8H  . pravastatin  40 mg Oral Daily  . sodium chloride flush  10-40 mL Intracatheter Q12H    Time spent on care of this patient: 35 mins   Dois Juarbe T , MD   [facility] Office  [phone] Pager - Text Page per Loretha Stapler as per below:  On-Call/Text Page:      [email or web address]      password [email or web address]  If 7PM-7AM, please contact night-coverage [email or web address] Password [email or web address] 08/12/2015, 3:55 PM   LOS: 5 days

## 2015-08-13 LAB — ANAEROBIC CULTURE

## 2015-08-13 LAB — BASIC METABOLIC PANEL
Anion gap: 8 (ref 5–15)
BUN: 18 mg/dL (ref 6–20)
CHLORIDE: 110 mmol/L (ref 101–111)
CO2: 30 mmol/L (ref 22–32)
CREATININE: 0.84 mg/dL (ref 0.44–1.00)
Calcium: 8.5 mg/dL — ABNORMAL LOW (ref 8.9–10.3)
GFR calc Af Amer: >60 mL/min (ref >60)
GFR calc non Af Amer: >60 mL/min (ref >60)
GLUCOSE: 169 mg/dL — AB (ref 65–99)
Potassium: 3.5 mmol/L (ref 3.5–5.1)
Sodium: 148 mmol/L — ABNORMAL HIGH (ref 135–145)

## 2015-08-13 LAB — CBC
HCT: 29.4 % — ABNORMAL LOW (ref 36.0–46.0)
Hemoglobin: 9.3 g/dL — ABNORMAL LOW (ref 12.0–15.0)
MCH: 31.4 pg (ref 26.0–34.0)
MCHC: 31.6 g/dL (ref 30.0–36.0)
MCV: 99.3 fL (ref 78.0–100.0)
PLATELETS: 277 10*3/uL (ref 150–400)
RBC: 2.96 MIL/uL — ABNORMAL LOW (ref 3.87–5.11)
RDW: 16 % — AB (ref 11.5–15.5)
WBC: 12 10*3/uL — ABNORMAL HIGH (ref 4.0–10.5)

## 2015-08-13 LAB — GLUCOSE, CAPILLARY
GLUCOSE-CAPILLARY: 186 mg/dL — AB (ref 65–99)
GLUCOSE-CAPILLARY: 179 mg/dL — AB (ref 65–99)
Glucose-Capillary: 148 mg/dL — ABNORMAL HIGH (ref 65–99)
Glucose-Capillary: 157 mg/dL — ABNORMAL HIGH (ref 65–99)

## 2015-08-13 MED ORDER — CLINDAMYCIN PHOSPHATE 900 MG/50ML IV SOLN
900.0000 mg | Freq: Four times a day (QID) | INTRAVENOUS | Status: DC
  Administered 2015-08-13 – 2015-08-14 (×4): 900 mg via INTRAVENOUS
  Filled 2015-08-13 (×6): qty 50

## 2015-08-13 MED ORDER — INSULIN GLARGINE 100 UNIT/ML ~~LOC~~ SOLN
18.0000 [IU] | Freq: Two times a day (BID) | SUBCUTANEOUS | Status: DC
  Administered 2015-08-13 – 2015-08-15 (×4): 18 [IU] via SUBCUTANEOUS
  Filled 2015-08-13 (×5): qty 0.18

## 2015-08-13 MED ORDER — DEXTROSE 5 % IV SOLN
INTRAVENOUS | Status: AC
  Administered 2015-08-13: 13:00:00 via INTRAVENOUS

## 2015-08-13 MED ORDER — CALCIUM CARBONATE ANTACID 500 MG PO CHEW
1.0000 | CHEWABLE_TABLET | Freq: Three times a day (TID) | ORAL | Status: DC | PRN
  Administered 2015-08-13: 200 mg via ORAL
  Filled 2015-08-13: qty 1

## 2015-08-13 NOTE — Progress Notes (Signed)
PT Cancellation Note  Patient Details Name: Sophia EllisonGenevieve A Birenbaum MRN: [id number] DOB: [birth year]   Cancelled Treatment:    Reason Eval/Treat Not Completed: Other (comment)  Attempted PT eval however pt throwing up;   Will follow up later today as time allows;  Otherwise, will follow up for PT tomorrow;   Thank you,  Van ClinesHolly Anaia Frith, PT  Acute Rehabilitation Services Pager [phone] Office [phone]    Van ClinesGarrigan, Nikia Mangino [facility] 08/13/2015, 3:41 PM

## 2015-08-13 NOTE — Progress Notes (Addendum)
PHARMACY - LOVENOX FOR VTE PROPHYLAXIS  Wt 116 kg Ht 63" Body mass index is 45.34 kg/(m^2).  SCr wnl, CrCl >30 ml/min Hb 9.3 - stable Platelets wnl  Continue Lovenox 55 mg SQ q24h (0.5 mg/kg) Pharmacy signing off but will adjust if needed  [facility] Hospital[facility]ennifer Nezperce, LandmarkPharm.D., BCPS Clinical Pharmacist Pager: [phone] 08/13/2015 11:09 AM

## 2015-08-13 NOTE — Progress Notes (Signed)
Progress Note: General Surgery Service   Subjective: No complaints, able to get up to chair  Objective: Vital signs in last 24 hours: Temp:  [98.2 F (36.8 C)-98.7 F (37.1 C)] 98.2 F (36.8 C) (03/05 0507) Pulse Rate:  [37-82] 37 (03/05 0507) Resp:  [17-18] 18 (03/05 0507) BP: (155-162)/(71-94) 162/83 mmHg (03/05 0507) SpO2:  [96 %-100 %] 96 % (03/05 0507) Weight:  [116.075 kg (255 lb 14.4 oz)] 116.075 kg (255 lb 14.4 oz) (03/05 0500) Last BM Date: 08/12/15  Intake/Output from previous day: 03/04 0701 - 03/05 0700 In: 2781 [P.O.:1390; I.V.:1022; IV Piggyback:369] Out: 1550 [Urine:1550] Intake/Output this shift: Total I/O In: 240 [P.O.:240] Out: -   Lungs: CTAB  Cardiovascular: bradycardic  Abd: soft, NT  Extremities: dressing in place  Neuro: AOx4  Lab Results: CBC   Recent Labs  08/12/15 0525 08/13/15 0500  WBC 13.9* 12.0*  HGB 9.2* 9.3*  HCT 28.4* 29.4*  PLT 267 277   BMET  Recent Labs  08/12/15 0525 08/13/15 0500  NA 148* 148*  K 3.9 3.5  CL 113* 110  CO2 27 30  GLUCOSE 179* 169*  BUN 30* 18  CREATININE 1.06* 0.84  CALCIUM 8.6* 8.5*   PT/INR No results for input(s): LABPROT, INR in the last 72 hours. ABG No results for input(s): PHART, HCO3 in the last 72 hours.  Invalid input(s): PCO2, PO2  Studies/Results:  Anti-infectives: Anti-infectives    Start     Dose/Rate Route Frequency Ordered Stop   08/13/15 0200  vancomycin (VANCOCIN) 1,500 mg in sodium chloride 0.9 % 500 mL IVPB  Status:  Discontinued     1,500 mg 250 mL/hr over 120 Minutes Intravenous Every 24 hours 08/12/15 0713 08/12/15 0916   08/12/15 0745  vancomycin (VANCOCIN) 250 mg in sodium chloride 0.9 % 100 mL IVPB  Status:  Discontinued     250 mg 100 mL/hr over 60 Minutes Intravenous  Once 08/12/15 0727 08/12/15 0735   08/08/15 0600  piperacillin-tazobactam (ZOSYN) IVPB 3.375 g     3.375 g 12.5 mL/hr over 240 Minutes Intravenous Every 8 hours 08/08/15 0030      08/08/15 0600  vancomycin (VANCOCIN) 1,250 mg in sodium chloride 0.9 % 250 mL IVPB  Status:  Discontinued     1,250 mg 166.7 mL/hr over 90 Minutes Intravenous Every 24 hours 08/08/15 0032 08/12/15 0713   08/08/15 0200  clindamycin (CLEOCIN) IVPB 600 mg     600 mg 100 mL/hr over 30 Minutes Intravenous 3 times per day 08/08/15 0023     08/08/15 0045  piperacillin-tazobactam (ZOSYN) IVPB 3.375 g     3.375 g 100 mL/hr over 30 Minutes Intravenous  Once 08/08/15 0030 08/08/15 0200   08/08/15 0030  piperacillin-tazobactam (ZOSYN) IVPB 3.375 g  Status:  Discontinued     3.375 g 12.5 mL/hr over 240 Minutes Intravenous 4 times per day 08/08/15 0023 08/08/15 0028   08/07/15 1645  piperacillin-tazobactam (ZOSYN) IVPB 3.375 g     3.375 g 100 mL/hr over 30 Minutes Intravenous  Once 08/07/15 1636 08/07/15 1715   08/07/15 1645  vancomycin (VANCOCIN) IVPB 1000 mg/200 mL premix     1,000 mg 200 mL/hr over 60 Minutes Intravenous  Once 08/07/15 1636 08/07/15 1835   08/07/15 1645  clindamycin (CLEOCIN) IVPB 600 mg     600 mg 100 mL/hr over 30 Minutes Intravenous  Once 08/07/15 1636 08/07/15 1905      Medications: Scheduled Meds: . clindamycin (CLEOCIN) IV  600 mg Intravenous 3  times per day  . enoxaparin (LOVENOX) injection  55 mg Subcutaneous Q24H  . FLUoxetine  20 mg Oral BID  . insulin aspart  0-20 Units Subcutaneous TID WC  . insulin aspart  0-5 Units Subcutaneous QHS  . insulin glargine  14 Units Subcutaneous BID  . metoprolol  50 mg Oral BID  . NIFEdipine  60 mg Oral Daily  . piperacillin-tazobactam (ZOSYN)  IV  3.375 g Intravenous Q8H  . pravastatin  40 mg Oral Daily   Continuous Infusions: . sodium chloride 60 mL/hr at 08/12/15 1714   PRN Meds:.acetaminophen (TYLENOL) oral liquid 160 mg/5 mL, hydrALAZINE, morphine injection, [DISCONTINUED] ondansetron **OR** ondansetron (ZOFRAN) IV, oxyCODONE, sodium chloride flush  Assessment/Plan: Patient Active Problem List   Diagnosis Date Noted   . Acute respiratory failure (HCC)   . Endotracheal tube present   . Necrotizing fasciitis (HCC) of inner thigh 08/07/2015  . IDDM (insulin dependent diabetes mellitus) (HCC) 08/07/2015  . History of hypertension 08/07/2015  . Hypotension 08/07/2015  . Acute renal insufficiency 08/07/2015  . Preop testing    s/p Procedure(s): INCISION AND DRAINAGE 08/09/2015 WBC downtrending -continue dressing changes -continue antibiotics -continue glucose control   LOS: 6 days   Rodman Pickle, MD Pg# [phone] [facility]

## 2015-08-13 NOTE — Progress Notes (Addendum)
[place] TEAM 1 - Stepdown/ICU TEAM PROGRESS NOTE  [name] OZH:[id number] DOB: December 13, 1955 DOA: 08/07/2015 PCP: [name], MD  Admit HPI / Brief Narrative:  60 y.o. F Hx DM, HTN, Depression, and Obesity who presented to the ED 2/27 with a 6 day hx of nausea, fevers and lightheadedness w/ a lot of swelling and pain of the R inner thigh. She has a hx of "boils" in and around her groin in the past. She had temps up to 102 most every day x 6 days, and was not able to eat much.    In the ED WBC 15.8, temp 98.9, BP 99/45 and O2 95% on 2L [place]. Patient found to have large amount swelling R upper / medial thigh. CT w contrast showed "aggressive infection/ cellulitis involving R medial upper thigh and groin area w extensive gas in the SQ fat....likely necrotizing fasciitis." She was subsequently transfered to [facility] and evaluated by Gen Surgery.   She was transferred under my care on 08/13/2015 on day 6 of her hospital stay.    HPI/Subjective:  The pt is sitting up in a having breakfast, denies any headache, no chest abdominal pain, no fever chills or weakness.  Assessment/Plan:  Coag neg Staph Necrotizing fasciitis Rt thigh w/ severe sepsis  Now s/p I&D on 08/11/2015 - cultures noted, patient has PICC line, will taper down to vancomycin only follow culture and sensitivity- wound care per Gen Surgery , discussed with general surgery on 08/13/2015,. Follow-up in their office. Wound care instructions per general surgery. Discussed with ID physician Dr. Rosetta Posner on 08/13/2015, continue on clindamycin only, switched to oral total of 14-20 days of clindamycin. Clinically much better and infection almost resolved.  Anemia of critical illness Hgb stable at ~9 - no indication of acute blood loss  Hypernatremia  Encourage free water - short term gentle D5W for 12 hours monitor CBGs.  HTN BP poorly controlled at this time - adjust medical tx and follow   Acute renal insuff  Likely due  to sepsis now resolved.    Morbid obesity - Body mass index is 45.34 kg/(m^2). Follow with PCP. Weight reduction.  DM2  CBG not yet at goal - Lantus further adjust tx and follow trend - A1c 9.8 08/08/15  CBG (last 3)   Recent Labs  08/12/15 1718 08/12/15 2110 08/13/15 0752  GLUCAP 189* 206* 148*   Lab Results  Component Value Date   HGBA1C 9.8* 08/08/2015    Code Status: FULL Family Communication: no family present at time of exam Disposition Plan: med bed - wound care per Gen Surg   Consultants: Gen Surgery  PCCM   Antibiotics: Clindamycin 2/27 > Zosyn 2/27 > Vancomycin 2/27 > 3/03  SIGNIFICANT EVENTS: 2/27 Admit 2/28 Surgery consulted >> I&D Rt leg  3/01 I&D Rt leg 3/03 To floor bed  LINES/TUBES: 2/28 ETT >> 3/02 3/01 Rt PICC >>  DVT prophylaxis: SCDs start on Lovenox 08/13/2015  Objective: Blood pressure 157/77, pulse 82, temperature 98.2 F (36.8 C), temperature source Oral, resp. rate 18, height  (1.6 m), weight 116.075 kg (255 lb 14.4 oz), SpO2 96 %.  Intake/Output Summary (Last 24 hours) at 08/13/15 1027 Last data filed at 08/13/15 6295  Gross per 24 hour  Intake   2304 ml  Output   1550 ml  Net    754 ml   Exam: General: No acute respiratory distress Lungs: Clear to auscultation bilaterally without wheezes or crackles Cardiovascular: Regular rate and rhythm  without murmur gallop or rub normal S1 and S2 Abdomen: Nontender, obese, soft, bowel sounds positive, no rebound, no ascites, no appreciable mass Extremities: No significant cyanosis, clubbing, or edema bilateral lower extremities  Data Reviewed: Basic Metabolic Panel:  Recent Labs Lab 08/08/15 0317 08/08/15 0908  08/08/15 1640 08/09/15 0255 08/10/15 0310 08/12/15 0525 08/13/15 0500  NA 139 140  < > 141 142 145 148* 148*  K 2.9* 2.8*  < > 3.3* 3.2* 4.0 3.9 3.5  CL 96* 99*  < > 100* 103 106 113* 110  CO2 26 28  < > 25 25 26 27 30   GLUCOSE 288* 195*  < > 219* 114* 120*  179* 169*  BUN 42* 42*  < > 42* 47* 53* 30* 18  CREATININE 1.71* 1.57*  < > 1.54* 1.56* 1.93* 1.06* 0.84  CALCIUM 9.1 9.0  < > 8.5* 8.3* 8.4* 8.6* 8.5*  MG 2.4 2.5*  --   --   --  2.6*  --   --   < > = values in this interval not displayed.  CBC:  Recent Labs Lab 08/07/15 1247  08/08/15 0908 08/08/15 1137 08/09/15 0255 08/10/15 0310 08/12/15 0525 08/13/15 0500  WBC 15.8*  < > 13.3*  --  13.0* 13.5* 13.9* 12.0*  NEUTROABS 13.6*  --   --   --   --   --   --   --   HGB 12.9  < > 11.2* 11.6* 9.6* 9.1* 9.2* 9.3*  HCT 36.7  < > 31.9* 34.0* 28.8* 28.7* 28.4* 29.4*  MCV 95.3  < > 94.4  --  94.4 98.6 101.1* 99.3  PLT 246  < > 244  --  249 267 267 277  < > = values in this interval not displayed.  Liver Function Tests:  Recent Labs Lab 08/08/15 0317  AST 15  14*  ALT 12*  12*  ALKPHOS 126  106  BILITOT 0.7  0.9  PROT 6.6  6.6  ALBUMIN 2.2*  2.1*   Coags:  Recent Labs Lab 08/08/15 0908  INR 1.24    Recent Labs Lab 08/08/15 0908  APTT 30   CBG:  Recent Labs Lab 08/12/15 0814 08/12/15 1151 08/12/15 1718 08/12/15 2110 08/13/15 0752  GLUCAP 171* 217* 189* 206* 148*    Recent Results (from the past 240 hour(s))  Culture, routine-abscess     Status: None   Collection Time: 08/07/15  4:40 PM  Result Value Ref Range Status   Specimen Description ABSCESS RIGHT INNER THIGH  Final   Special Requests NONE  Final   Gram Stain   Final    MODERATE WBC PRESENT,BOTH PMN AND MONONUCLEAR NO SQUAMOUS EPITHELIAL CELLS SEEN MODERATE GRAM POSITIVE COCCI IN PAIRS Performed at [facility]    Culture   Final    NO GROWTH 2 DAYS Performed at [facility]    Report Status 08/10/2015 FINAL  Final  MRSA PCR Screening     Status: None   Collection Time: 08/08/15 12:11 AM  Result Value Ref Range Status   MRSA by PCR NEGATIVE NEGATIVE Final    Comment:        The GeneXpert MRSA Assay (FDA approved for NASAL specimens only), is one component of  a comprehensive MRSA colonization surveillance program. It is not intended to diagnose MRSA infection nor to guide or monitor treatment for MRSA infections.   Culture, routine-abscess     Status: None   Collection Time: 08/08/15 12:21 PM  Result Value Ref Range Status   Specimen Description ABSCESS RIGHT THIGH  Final   Special Requests NONE  Final   Gram Stain   Final    FEW WBC PRESENT, PREDOMINANTLY PMN NO SQUAMOUS EPITHELIAL CELLS SEEN FEW GRAM POSITIVE COCCI IN PAIRS Performed at [facility]    Culture   Final    FEW STAPHYLOCOCCUS SPECIES (COAGULASE NEGATIVE) Culture reincubated for better growth Performed at [facility]    Report Status 08/11/2015 FINAL  Final  Anaerobic culture     Status: None   Collection Time: 08/08/15 12:21 PM  Result Value Ref Range Status   Specimen Description ABSCESS RIGHT THIGH  Final   Special Requests NONE  Final   Gram Stain   Final    FEW WBC PRESENT, PREDOMINANTLY PMN NO SQUAMOUS EPITHELIAL CELLS SEEN FEW GRAM POSITIVE COCCI IN PAIRS Performed at [facility]    Culture   Final    NO ANAEROBES ISOLATED Performed at [facility]    Report Status 08/13/2015 FINAL  Final     Studies:   Recent x-ray studies have been reviewed in detail    Scheduled Meds:  Scheduled Meds: . enoxaparin (LOVENOX) injection  55 mg Subcutaneous Q24H  . FLUoxetine  20 mg Oral BID  . insulin aspart  0-20 Units Subcutaneous TID WC  . insulin aspart  0-5 Units Subcutaneous QHS  . insulin glargine  18 Units Subcutaneous BID  . metoprolol  50 mg Oral BID  . NIFEdipine  60 mg Oral Daily  . pravastatin  40 mg Oral Daily    Time spent on care of this patient: 35 mins  Signature  Susa Raring K M.D on 08/13/2015 at 10:27 AM  Between 7am to 7pm - Pager - [phone], After 7pm go to [email or web address] - password [facility]  [facility]  - Office  [phone]   LOS: 6 days

## 2015-08-14 LAB — BASIC METABOLIC PANEL
ANION GAP: 10 (ref 5–15)
BUN: 10 mg/dL (ref 6–20)
CHLORIDE: 103 mmol/L (ref 101–111)
CO2: 30 mmol/L (ref 22–32)
CREATININE: 1.16 mg/dL — AB (ref 0.44–1.00)
Calcium: 8.4 mg/dL — ABNORMAL LOW (ref 8.9–10.3)
GFR calc non Af Amer: 50 mL/min — ABNORMAL LOW (ref >60)
GFR, EST AFRICAN AMERICAN: 58 mL/min — AB (ref >60)
Glucose, Bld: 123 mg/dL — ABNORMAL HIGH (ref 65–99)
POTASSIUM: 3 mmol/L — AB (ref 3.5–5.1)
Sodium: 143 mmol/L (ref 135–145)

## 2015-08-14 LAB — GLUCOSE, CAPILLARY
GLUCOSE-CAPILLARY: 86 mg/dL (ref 65–99)
Glucose-Capillary: 99 mg/dL (ref 65–99)
Glucose-Capillary: 73 mg/dL (ref 65–99)
Glucose-Capillary: 94 mg/dL (ref 65–99)

## 2015-08-14 MED ORDER — CLINDAMYCIN HCL 150 MG PO CAPS
450.0000 mg | ORAL_CAPSULE | Freq: Three times a day (TID) | ORAL | Status: DC
  Administered 2015-08-14 – 2015-08-15 (×3): 450 mg via ORAL
  Filled 2015-08-14 (×5): qty 3

## 2015-08-14 MED ORDER — POTASSIUM CHLORIDE CRYS ER 20 MEQ PO TBCR
40.0000 meq | EXTENDED_RELEASE_TABLET | Freq: Once | ORAL | Status: AC
  Administered 2015-08-14: 40 meq via ORAL
  Filled 2015-08-14: qty 2

## 2015-08-14 MED ORDER — CLINDAMYCIN HCL 300 MG PO CAPS: 900.0000 mg | ORAL_CAPSULE | Freq: Three times a day (TID) | ORAL | Status: DC

## 2015-08-14 MED ORDER — ADULT MULTIVITAMIN W/MINERALS CH
1.0000 | ORAL_TABLET | Freq: Every day | ORAL | Status: DC
  Administered 2015-08-14 – 2015-08-15 (×2): 1 via ORAL
  Filled 2015-08-14 (×2): qty 1

## 2015-08-14 MED ORDER — POTASSIUM CHLORIDE 10 MEQ/100ML IV SOLN
10.0000 meq | INTRAVENOUS | Status: AC
  Administered 2015-08-14 (×4): 10 meq via INTRAVENOUS
  Filled 2015-08-14 (×4): qty 100

## 2015-08-14 MED ORDER — CLINDAMYCIN HCL 300 MG PO CAPS
450.0000 mg | ORAL_CAPSULE | Freq: Three times a day (TID) | ORAL | Status: DC
  Administered 2015-08-14: 450 mg via ORAL
  Filled 2015-08-14 (×3): qty 1

## 2015-08-14 MED ORDER — GLUCERNA SHAKE PO LIQD
237.0000 mL | Freq: Three times a day (TID) | ORAL | Status: DC
  Administered 2015-08-14 – 2015-08-15 (×4): 237 mL via ORAL

## 2015-08-14 NOTE — Progress Notes (Signed)
Nutrition Follow-up  DOCUMENTATION CODES:   Morbid obesity  INTERVENTION:   -Glucerna Shake po TID, each supplement provides 220 kcal and 10 grams of protein -MVI daily  NUTRITION DIAGNOSIS:   Increased nutrient needs related to wound healing as evidenced by estimated needs.  Ongoing  GOAL:   Patient will meet greater than or equal to 90% of their needs  Progressing  MONITOR:   PO intake, Supplement acceptance, Labs, Weight trends, Skin, I & O's  REASON FOR ASSESSMENT:   Ventilator    ASSESSMENT:   60 y.o. Female with 20 yr hx of IDDM, HTN, depression, obesity who presented to ED with 6 day hx of nausea, fevers and lightheadedness. She has hx of "boils" in and around her private area in the past. Now she has a lot of swelling and pain about the inner thigh on the R. She has had temps up to 102 most every day x 6 days, not able to eat much, lightheaded w standing. No abd pain, CP, no diarrhea  Pelvis CT w/contrast 2/27 showed likely necrotizing fasciitis.  Patient s/p procedure 2/28: IRRIGATION AND DEBRIDEMENT OF RIGHT LOWER EXTREMITY (RIGHT)  Pt extubated on 08/10/15 and transferred from ICU to surgical floor on 08/11/15.   Spoke with RN prior to visiting pt. She reports that pt appetite has been poor over the past few days secondary to pain and nausea. Plan is to d/c to SNF soon.   Spoke with pt, who was sitting in recliner at time of visit. She confirmed poor appetite, per conversation with RN. Pt reports she consumed a banana last night, which made her sick, and has since been afraid to eat. She reports tolerating liquids and fruit well, but eats very little else. Pt reported to this RD that she is trying to consume more protein to assist with healing. She is amenable to Glucerna supplement. RD reinforced importance of good meal intake, particularly protein, to aid with wound healing.   Labs reviewed: K: 3.0, CBGS: 99-186.  Diet Order:  Diet Carb Modified Fluid  consistency:: Thin; Room service appropriate?: Yes  Skin:  Wound (see comment) (rt leg incision, rt hip necrotizing fascitis)  Last BM:  08/14/15  Height:   Ht Readings from Last 1 Encounters:  08/07/15 5\' 3"  (1.6 m)    Weight:   Wt Readings from Last 1 Encounters:  08/14/15 256 lb 13.4 oz (116.5 kg)    Ideal Body Weight:  52.2 kg  BMI:  Body mass index is 45.51 kg/(m^2).  Estimated Nutritional Needs:   Kcal:  [id number]  Protein:  140-150 gm  Fluid:  per MD  EDUCATION NEEDS:   Education needs addressed  Denise Washburn A. Mayford KnifeWilliams, RD, LDN, CDE Pager: [phone] After hours Pager: [phone]

## 2015-08-14 NOTE — [place] FL2 (Signed)
[place] MEDICAID FL2 LEVEL OF CARE SCREENING TOOL     IDENTIFICATION  Patient Name: [name] Birthdate: 1956/03/24 Sex: female Admission Date (Current Location): 08/07/2015  [facility] and [place] Number:  [employer] and Address:  [facility], [address] [place], [place] [address]      Provider Number: [id number]  Attending Physician Name and Address:  Leroy Sea, MD  Relative Name and Phone Number:       Current Level of Care: Hospital Recommended Level of Care: Skilled Nursing Facility Prior Approval Number:    Date Approved/Denied:   PASRR Number:   [id number]   Discharge Plan: SNF    Current Diagnoses: Patient Active Problem List   Diagnosis Date Noted  . Acute respiratory failure (HCC)   . Endotracheal tube present   . Necrotizing fasciitis (HCC) of inner thigh 08/07/2015  . IDDM (insulin dependent diabetes mellitus) (HCC) 08/07/2015  . History of hypertension 08/07/2015  . Hypotension 08/07/2015  . Acute renal insufficiency 08/07/2015  . Preop testing     Orientation RESPIRATION BLADDER Height & Weight     Self, Time, Situation, Place  O2 (2L) Indwelling catheter Weight: 256 lb 13.4 oz (116.5 kg) Height:   (160 cm)  BEHAVIORAL SYMPTOMS/MOOD NEUROLOGICAL BOWEL NUTRITION STATUS   (appropriate)  (n/a) Incontinent Diet (Carb Modified)  AMBULATORY STATUS COMMUNICATION OF NEEDS Skin   Limited Assist Verbally Surgical wounds, Other (Comment) (R Leg Incision; necrotizing fascitis R Hip, change dressing 2x/day)                       Personal Care Assistance Level of Assistance  Bathing, Feeding, Dressing Bathing Assistance: Limited assistance Feeding assistance: Independent Dressing Assistance: Limited assistance     Functional Limitations Info  Sight, Hearing, Speech Sight Info: Adequate Hearing Info: Adequate Speech Info: Adequate    SPECIAL CARE FACTORS FREQUENCY  PT (By licensed  PT), OT (By licensed OT)     PT Frequency: 5x/week OT Frequency: 5x/week            Contractures Contractures Info: Not present    Additional Factors Info  Allergies, Code Status, Insulin Sliding Scale Code Status Info: FULL CODE Allergies Info: Penicillins   Insulin Sliding Scale Info: 0-20 units, 3x/day;  0-5 units, at bedtime       Current Medications (08/14/2015):  This is the current hospital active medication list Current Facility-Administered Medications  Medication Dose Route Frequency Provider Last Rate Last Dose  . acetaminophen (TYLENOL) solution 650 mg  650 mg Oral Q6H PRN Coralyn Helling, MD      . calcium carbonate (TUMS - dosed in mg elemental calcium) chewable tablet 200 mg of elemental calcium  1 tablet Oral TID PRN Leroy Sea, MD   200 mg of elemental calcium at 08/13/15 1900  . clindamycin (CLEOCIN) capsule 900 mg  900 mg Oral 3 times per day Leroy Sea, MD      . enoxaparin (LOVENOX) injection 55 mg  55 mg Subcutaneous Q24H Lonia Blood, MD   55 mg at 08/13/15 1808  . feeding supplement (GLUCERNA SHAKE) (GLUCERNA SHAKE) liquid 237 mL  237 mL Oral TID BM Leroy Sea, MD      . FLUoxetine (PROZAC) capsule 20 mg  20 mg Oral BID Coralyn Helling, MD   20 mg at 08/14/15 0945  . hydrALAZINE (APRESOLINE) injection 10 mg  10 mg Intravenous Q4H PRN Lonia Blood,  MD   10 mg at 08/14/15 0533  . insulin aspart (novoLOG) injection 0-20 Units  0-20 Units Subcutaneous TID WC Coralyn HellingVineet Sood, MD   4 Units at 08/13/15 1809  . insulin aspart (novoLOG) injection 0-5 Units  0-5 Units Subcutaneous QHS Coralyn HellingVineet Sood, MD   2 Units at 08/12/15 2126  . insulin glargine (LANTUS) injection 18 Units  18 Units Subcutaneous BID Leroy SeaPrashant K Singh, MD   18 Units at 08/14/15 [phone]  . metoprolol (LOPRESSOR) tablet 50 mg  50 mg Oral BID Coralyn HellingVineet Sood, MD   50 mg at 08/14/15 0945  . morphine 2 MG/ML injection 2-4 mg  2-4 mg Intravenous Q2H PRN Lonia BloodJeffrey T McClung, MD   4 mg at 08/14/15 [id number]   . multivitamin with minerals tablet 1 tablet  1 tablet Oral Daily Leroy SeaPrashant K Singh, MD      . NIFEdipine (PROCARDIA-XL/ADALAT CC) 24 hr tablet 60 mg  60 mg Oral Daily Lonia BloodJeffrey T McClung, MD   60 mg at 08/14/15 0946  . ondansetron (ZOFRAN) injection 4 mg  4 mg Intravenous Q6H PRN Delano Metzobert Schertz, MD   4 mg at 08/14/15 0427  . oxyCODONE (Oxy IR/ROXICODONE) immediate release tablet 5-10 mg  5-10 mg Oral Q4H PRN Sherrie GeorgeWillard Jennings, PA-C   10 mg at 08/13/15 2007  . potassium chloride 10 mEq in 100 mL IVPB  10 mEq Intravenous Q1 Hr x 4 Leroy SeaPrashant K Singh, MD      . potassium chloride SA (K-DUR,KLOR-CON) CR tablet 40 mEq  40 mEq Oral Once Leroy SeaPrashant K Singh, MD      . pravastatin (PRAVACHOL) tablet 40 mg  40 mg Oral Daily Coralyn HellingVineet Sood, MD   40 mg at 08/14/15 [phone]     Discharge Medications: Please see discharge summary for a list of discharge medications.  Relevant Imaging Results:  Relevant Lab Results:   Additional Information SS#[id number]   Jenita SeashoreMaggie Cox BSW Intern, [phone]

## 2015-08-14 NOTE — Progress Notes (Signed)
[facility] TEAM 1 - Stepdown/ICU TEAM PROGRESS NOTE  BEYONKA PITNEY ZOX:[id number] DOB: 1955-09-03 DOA: 08/07/2015 PCP: [name], MD  Admit HPI / Brief Narrative:  60 y.o. F Hx DM, HTN, Depression, and Obesity who presented to the ED 2/27 with a 6 day hx of nausea, fevers and lightheadedness w/ a lot of swelling and pain of the R inner thigh. She has a hx of "boils" in and around her groin in the past. She had temps up to 102 most every day x 6 days, and was not able to eat much.    In the ED WBC 15.8, temp 98.9, BP 99/45 and O2 95% on 2L [place]. Patient found to have large amount swelling R upper / medial thigh. CT w contrast showed "aggressive infection/ cellulitis involving R medial upper thigh and groin area w extensive gas in the SQ fat....likely necrotizing fasciitis." She was subsequently transfered to [facility] and evaluated by Gen Surgery.   She was transferred under my care on 08/13/2015 on day 6 of her hospital stay.    HPI/Subjective:  The pt is sitting up in a having breakfast, denies any headache, no chest abdominal pain, no fever chills or weakness.  Assessment/Plan:  Coag neg Staph Necrotizing fasciitis Rt thigh w/ severe sepsis  Now s/p I&D on 08/11/2015 - cultures noted, patient has PICC line, will taper down to vancomycin only follow culture and sensitivity- wound care per Gen Surgery , discussed with general surgery on 08/13/2015. Follow-up in their office. Wound care instructions per general surgery - ( Wet to dry dressing using normal saline, BID.  Use Kerlix, as wet portion, NS for wetting agent, ABD for dry portion, and paper tape as much as possible.  Minimize damage to skin) .  Discussed with ID physician Dr. Rosetta Posner on 08/13/2015, continue on clindamycin only, switched to oral total of 14-20 days of Clindamycin. Clinically much better and infection almost resolved.   Anemia of critical illness Hgb stable - no indication of acute blood loss  Hypernatremia   Resolved with hydration.  HTN  BP poorly controlled at this time - adjust medical tx and follow   Acute renal insuff Likely due to sepsis now resolved.   Hypokalemia. Replaced we'll monitor.  Morbid obesity - Body mass index is 45.51 kg/(m^2). Follow with PCP. Weight reduction.  DM2  CBG much better - Lantus further adjust tx and follow trend - A1c 9.8 on 08/08/15  CBG (last 3)   Recent Labs  08/13/15 1703 08/13/15 2146 08/14/15 0726  GLUCAP 179* 157* 99   Lab Results  Component Value Date   HGBA1C 9.8* 08/08/2015    Code Status: FULL  Family Communication: no family present at time of exam Disposition Plan: med bed - wound care per Gen Surg   Consultants:  Gen Surgery, PCCM   Anti-infectives    Start     Dose/Rate Route Frequency Ordered Stop   08/14/15 1400  clindamycin (CLEOCIN) capsule 900 mg     900 mg Oral 3 times per day 08/14/15 1136     08/13/15 1200  clindamycin (CLEOCIN) IVPB 900 mg  Status:  Discontinued     900 mg 100 mL/hr over 30 Minutes Intravenous 4 times per day 08/13/15 1045 08/14/15 1136   08/13/15 0200  vancomycin (VANCOCIN) 1,500 mg in sodium chloride 0.9 % 500 mL IVPB  Status:  Discontinued     1,500 mg 250 mL/hr over 120 Minutes Intravenous Every 24 hours 08/12/15 0713 08/12/15 0916  08/12/15 0745  vancomycin (VANCOCIN) 250 mg in sodium chloride 0.9 % 100 mL IVPB  Status:  Discontinued     250 mg 100 mL/hr over 60 Minutes Intravenous  Once 08/12/15 0727 08/12/15 0735   08/08/15 0600  piperacillin-tazobactam (ZOSYN) IVPB 3.375 g  Status:  Discontinued     3.375 g 12.5 mL/hr over 240 Minutes Intravenous Every 8 hours 08/08/15 0030 08/13/15 1027   08/08/15 0600  vancomycin (VANCOCIN) 1,250 mg in sodium chloride 0.9 % 250 mL IVPB  Status:  Discontinued     1,250 mg 166.7 mL/hr over 90 Minutes Intravenous Every 24 hours 08/08/15 0032 08/12/15 0713   08/08/15 0200  clindamycin (CLEOCIN) IVPB 600 mg  Status:  Discontinued     600 mg 100 mL/hr  over 30 Minutes Intravenous 3 times per day 08/08/15 0023 08/13/15 1027   08/08/15 0045  piperacillin-tazobactam (ZOSYN) IVPB 3.375 g     3.375 g 100 mL/hr over 30 Minutes Intravenous  Once 08/08/15 0030 08/08/15 0200   08/08/15 0030  piperacillin-tazobactam (ZOSYN) IVPB 3.375 g  Status:  Discontinued     3.375 g 12.5 mL/hr over 240 Minutes Intravenous 4 times per day 08/08/15 0023 08/08/15 0028   08/07/15 1645  piperacillin-tazobactam (ZOSYN) IVPB 3.375 g     3.375 g 100 mL/hr over 30 Minutes Intravenous  Once 08/07/15 1636 08/07/15 1715   08/07/15 1645  vancomycin (VANCOCIN) IVPB 1000 mg/200 mL premix     1,000 mg 200 mL/hr over 60 Minutes Intravenous  Once 08/07/15 1636 08/07/15 1835   08/07/15 1645  clindamycin (CLEOCIN) IVPB 600 mg     600 mg 100 mL/hr over 30 Minutes Intravenous  Once 08/07/15 1636 08/07/15 1905       SIGNIFICANT EVENTS: 2/27 Admit 2/28 Surgery consulted >> I&D Rt leg  3/01 I&D Rt leg 3/03 To floor bed  LINES/TUBES: 2/28 ETT >> 3/02 3/01 Rt PICC >>  DVT prophylaxis: SCDs started on Lovenox 08/13/2015  Objective:  Blood pressure 179/85, pulse 74, temperature 98.4 F (36.9 C), temperature source Oral, resp. rate 18, height 5\' 3"  (1.6 m), weight 116.5 kg (256 lb 13.4 oz), SpO2 100 %.  Intake/Output Summary (Last 24 hours) at 08/14/15 1136 Last data filed at 08/14/15 0500  Gross per 24 hour  Intake    600 ml  Output   3050 ml  Net  -2450 ml   Exam:  General: No acute respiratory distress Lungs: Clear to auscultation bilaterally without wheezes or crackles Cardiovascular: Regular rate and rhythm without murmur gallop or rub normal S1 and S2 Abdomen: Nontender, obese, soft, bowel sounds positive, no rebound, no ascites, no appreciable mass Extremities: No significant cyanosis, clubbing, or edema bilateral lower extremities  Data Reviewed: Basic Metabolic Panel:  Recent Labs Lab 08/08/15 0317 08/08/15 0908  08/09/15 0255 08/10/15 0310  08/12/15 0525 08/13/15 0500 08/14/15 0743  NA 139 140  < > 142 145 148* 148* 143  K 2.9* 2.8*  < > 3.2* 4.0 3.9 3.5 3.0*  CL 96* 99*  < > 103 106 113* 110 103  CO2 26 28  < > 25 26 27 30 30   GLUCOSE 288* 195*  < > 114* 120* 179* 169* 123*  BUN 42* 42*  < > 47* 53* 30* 18 10  CREATININE 1.71* 1.57*  < > 1.56* 1.93* 1.06* 0.84 1.16*  CALCIUM 9.1 9.0  < > 8.3* 8.4* 8.6* 8.5* 8.4*  MG 2.4 2.5*  --   --  2.6*  --   --   --   < > =  values in this interval not displayed.  CBC:  Recent Labs Lab 08/07/15 1247  08/08/15 0908 08/08/15 1137 08/09/15 0255 08/10/15 0310 08/12/15 0525 08/13/15 0500  WBC 15.8*  < > 13.3*  --  13.0* 13.5* 13.9* 12.0*  NEUTROABS 13.6*  --   --   --   --   --   --   --   HGB 12.9  < > 11.2* 11.6* 9.6* 9.1* 9.2* 9.3*  HCT 36.7  < > 31.9* 34.0* 28.8* 28.7* 28.4* 29.4*  MCV 95.3  < > 94.4  --  94.4 98.6 101.1* 99.3  PLT 246  < > 244  --  249 267 267 277  < > = values in this interval not displayed.  Liver Function Tests:  Recent Labs Lab 08/08/15 0317  AST 15  14*  ALT 12*  12*  ALKPHOS 126  106  BILITOT 0.7  0.9  PROT 6.6  6.6  ALBUMIN 2.2*  2.1*   Coags:  Recent Labs Lab 08/08/15 0908  INR 1.24    Recent Labs Lab 08/08/15 0908  APTT 30   CBG:  Recent Labs Lab 08/13/15 0752 08/13/15 1225 08/13/15 1703 08/13/15 2146 08/14/15 0726  GLUCAP 148* 186* 179* 157* 99    Recent Results (from the past 240 hour(s))  Culture, routine-abscess     Status: None   Collection Time: 08/07/15  4:40 PM  Result Value Ref Range Status   Specimen Description ABSCESS RIGHT INNER THIGH  Final   Special Requests NONE  Final   Gram Stain   Final    MODERATE WBC PRESENT,BOTH PMN AND MONONUCLEAR NO SQUAMOUS EPITHELIAL CELLS SEEN MODERATE GRAM POSITIVE COCCI IN PAIRS Performed at [facility]    Culture   Final    NO GROWTH 2 DAYS Performed at [facility]    Report Status 08/10/2015 FINAL  Final  MRSA PCR Screening      Status: None   Collection Time: 08/08/15 12:11 AM  Result Value Ref Range Status   MRSA by PCR NEGATIVE NEGATIVE Final    Comment:        The GeneXpert MRSA Assay (FDA approved for NASAL specimens only), is one component of a comprehensive MRSA colonization surveillance program. It is not intended to diagnose MRSA infection nor to guide or monitor treatment for MRSA infections.   Culture, routine-abscess     Status: None   Collection Time: 08/08/15 12:21 PM  Result Value Ref Range Status   Specimen Description ABSCESS RIGHT THIGH  Final   Special Requests NONE  Final   Gram Stain   Final    FEW WBC PRESENT, PREDOMINANTLY PMN NO SQUAMOUS EPITHELIAL CELLS SEEN FEW GRAM POSITIVE COCCI IN PAIRS Performed at [facility]    Culture   Final    FEW STAPHYLOCOCCUS SPECIES (COAGULASE NEGATIVE) Culture reincubated for better growth Performed at [facility]    Report Status 08/11/2015 FINAL  Final  Anaerobic culture     Status: None   Collection Time: 08/08/15 12:21 PM  Result Value Ref Range Status   Specimen Description ABSCESS RIGHT THIGH  Final   Special Requests NONE  Final   Gram Stain   Final    FEW WBC PRESENT, PREDOMINANTLY PMN NO SQUAMOUS EPITHELIAL CELLS SEEN FEW GRAM POSITIVE COCCI IN PAIRS Performed at [facility]    Culture   Final    NO ANAEROBES ISOLATED Performed at [facility]    Report Status  08/13/2015 FINAL  Final     Studies:   Recent x-ray studies have been reviewed in detail    Scheduled Meds:  Scheduled Meds: . clindamycin  900 mg Oral 3 times per day  . enoxaparin (LOVENOX) injection  55 mg Subcutaneous Q24H  . feeding supplement (GLUCERNA SHAKE)  237 mL Oral TID BM  . FLUoxetine  20 mg Oral BID  . insulin aspart  0-20 Units Subcutaneous TID WC  . insulin aspart  0-5 Units Subcutaneous QHS  . insulin glargine  18 Units Subcutaneous BID  . metoprolol  50 mg Oral BID  . multivitamin with minerals  1  tablet Oral Daily  . NIFEdipine  60 mg Oral Daily  . pravastatin  40 mg Oral Daily    Time spent on care of this patient: 35 mins  Signature  Susa RaringSINGH,Janese Radabaugh K M.D on 08/14/2015 at 11:36 AM  Between 7am to 7pm - Pager - [phone], After 7pm go to [email or web address] - password [email or web address]  [facility]  - Office  [phone]   LOS: 7 days

## 2015-08-14 NOTE — Progress Notes (Signed)
Patient ID: [name], female   DOB: 11/21/1955, 60 y.o.   MRN: [id number]     [address] [place], [place] [address]    Phone: [phone] FAX: [phone]     Subjective: Little pain with dressing changes. Afebrile.   Objective:  Vital signs:  Filed Vitals:   08/13/15 0906 08/13/15 1424 08/13/15 2153 08/14/15 0459  BP: 157/77 187/87 162/70 179/85  Pulse: 82 83 80 74  Temp:  98.4 F (36.9 C) 98.3 F (36.8 C) 98.4 F (36.9 C)  TempSrc:  Oral Oral Oral  Resp:  18 18 18   Height:      Weight:    116.5 kg (256 lb 13.4 oz)  SpO2:  98% 99% 100%    Last BM Date: 08/13/15  Intake/Output   Yesterday:  03/05 0701 - 03/06 0700 In: 840 [P.O.:840] Out: 3050 [Urine:3050] This shift:    Physical Exam: General: Pt awake/alert/oriented x4 in no acute distress  Skin:      Problem List:   Principal Problem:   Necrotizing fasciitis ([place]) of inner thigh Active Problems:   IDDM (insulin dependent diabetes mellitus) (HCC)   History of hypertension   Hypotension   Acute renal insufficiency   Endotracheal tube present   Acute respiratory failure (HCC)    Results:   Labs: Results for orders placed or performed during the hospital encounter of 08/07/15 (from the past 48 hour(s))  Glucose, capillary     Status: Abnormal   Collection Time: 08/12/15 11:51 AM  Result Value Ref Range   Glucose-Capillary 217 (H) 65 - 99 mg/dL   Comment 1 Notify RN   Glucose, capillary     Status: Abnormal   Collection Time: 08/12/15  5:18 PM  Result Value Ref Range   Glucose-Capillary 189 (H) 65 - 99 mg/dL   Comment 1 Notify RN   Glucose, capillary     Status: Abnormal   Collection Time: 08/12/15  9:10 PM  Result Value Ref Range   Glucose-Capillary 206 (H) 65 - 99 mg/dL   Comment 1 Notify RN   Basic metabolic panel     Status: Abnormal   Collection Time: 08/13/15  5:00 AM  Result Value Ref Range   Sodium  148 (H) 135 - 145 mmol/L   Potassium 3.5 3.5 - 5.1 mmol/L   Chloride 110 101 - 111 mmol/L   CO2 30 22 - 32 mmol/L   Glucose, Bld 169 (H) 65 - 99 mg/dL   BUN 18 6 - 20 mg/dL   Creatinine, Ser 0.84 0.44 - 1.00 mg/dL   Calcium 8.5 (L) 8.9 - 10.3 mg/dL   GFR calc non Af Amer >60 >60 mL/min   GFR calc Af Amer >60 >60 mL/min    Comment: (NOTE) The eGFR has been calculated using the CKD EPI equation. This calculation has not been validated in all clinical situations. eGFR's persistently <60 mL/min signify possible Chronic Kidney Disease.    Anion gap 8 5 - 15  CBC     Status: Abnormal   Collection Time: 08/13/15  5:00 AM  Result Value Ref Range   WBC 12.0 (H) 4.0 - 10.5 K/uL   RBC 2.96 (L) 3.87 - 5.11 MIL/uL   Hemoglobin 9.3 (L) 12.0 - 15.0 g/dL   HCT 29.4 (L) 36.0 - 46.0 %   MCV 99.3 78.0 - 100.0 fL   MCH 31.4 26.0 - 34.0 pg  MCHC 31.6 30.0 - 36.0 g/dL   RDW 16.0 (H) 11.5 - 15.5 %   Platelets 277 150 - 400 K/uL  Glucose, capillary     Status: Abnormal   Collection Time: 08/13/15  7:52 AM  Result Value Ref Range   Glucose-Capillary 148 (H) 65 - 99 mg/dL   Comment 1 Notify RN   Glucose, capillary     Status: Abnormal   Collection Time: 08/13/15 12:25 PM  Result Value Ref Range   Glucose-Capillary 186 (H) 65 - 99 mg/dL   Comment 1 Notify RN   Glucose, capillary     Status: Abnormal   Collection Time: 08/13/15  5:03 PM  Result Value Ref Range   Glucose-Capillary 179 (H) 65 - 99 mg/dL   Comment 1 Notify RN   Glucose, capillary     Status: Abnormal   Collection Time: 08/13/15  9:46 PM  Result Value Ref Range   Glucose-Capillary 157 (H) 65 - 99 mg/dL  Glucose, capillary     Status: None   Collection Time: 08/14/15  7:26 AM  Result Value Ref Range   Glucose-Capillary 99 65 - 99 mg/dL  Basic metabolic panel     Status: Abnormal   Collection Time: 08/14/15  7:43 AM  Result Value Ref Range   Sodium 143 135 - 145 mmol/L   Potassium 3.0 (L) 3.5 - 5.1 mmol/L   Chloride 103 101 -  111 mmol/L   CO2 30 22 - 32 mmol/L   Glucose, Bld 123 (H) 65 - 99 mg/dL   BUN 10 6 - 20 mg/dL   Creatinine, Ser 1.16 (H) 0.44 - 1.00 mg/dL   Calcium 8.4 (L) 8.9 - 10.3 mg/dL   GFR calc non Af Amer 50 (L) >60 mL/min   GFR calc Af Amer 58 (L) >60 mL/min    Comment: (NOTE) The eGFR has been calculated using the CKD EPI equation. This calculation has not been validated in all clinical situations. eGFR's persistently <60 mL/min signify possible Chronic Kidney Disease.    Anion gap 10 5 - 15    Imaging / Studies: No results found.  Medications / Allergies:  Scheduled Meds: . clindamycin (CLEOCIN) IV  900 mg Intravenous 4 times per day  . enoxaparin (LOVENOX) injection  55 mg Subcutaneous Q24H  . FLUoxetine  20 mg Oral BID  . insulin aspart  0-20 Units Subcutaneous TID WC  . insulin aspart  0-5 Units Subcutaneous QHS  . insulin glargine  18 Units Subcutaneous BID  . metoprolol  50 mg Oral BID  . NIFEdipine  60 mg Oral Daily  . pravastatin  40 mg Oral Daily   Continuous Infusions: . sodium chloride 60 mL/hr at 08/13/15 1842   PRN Meds:.acetaminophen (TYLENOL) oral liquid 160 mg/5 mL, calcium carbonate, hydrALAZINE, morphine injection, [DISCONTINUED] ondansetron **OR** ondansetron (ZOFRAN) IV, oxyCODONE  Antibiotics: Anti-infectives    Start     Dose/Rate Route Frequency Ordered Stop   08/13/15 1200  clindamycin (CLEOCIN) IVPB 900 mg     900 mg 100 mL/hr over 30 Minutes Intravenous 4 times per day 08/13/15 1045     08/13/15 0200  vancomycin (VANCOCIN) 1,500 mg in sodium chloride 0.9 % 500 mL IVPB  Status:  Discontinued     1,500 mg 250 mL/hr over 120 Minutes Intravenous Every 24 hours 08/12/15 0713 08/12/15 0916   08/12/15 0745  vancomycin (VANCOCIN) 250 mg in sodium chloride 0.9 % 100 mL IVPB  Status:  Discontinued     250 mg  100 mL/hr over 60 Minutes Intravenous  Once 08/12/15 0727 08/12/15 0735   08/08/15 0600  piperacillin-tazobactam (ZOSYN) IVPB 3.375 g  Status:   Discontinued     3.375 g 12.5 mL/hr over 240 Minutes Intravenous Every 8 hours 08/08/15 0030 08/13/15 1027   08/08/15 0600  vancomycin (VANCOCIN) 1,250 mg in sodium chloride 0.9 % 250 mL IVPB  Status:  Discontinued     1,250 mg 166.7 mL/hr over 90 Minutes Intravenous Every 24 hours 08/08/15 0032 08/12/15 0713   08/08/15 0200  clindamycin (CLEOCIN) IVPB 600 mg  Status:  Discontinued     600 mg 100 mL/hr over 30 Minutes Intravenous 3 times per day 08/08/15 0023 08/13/15 1027   08/08/15 0045  piperacillin-tazobactam (ZOSYN) IVPB 3.375 g     3.375 g 100 mL/hr over 30 Minutes Intravenous  Once 08/08/15 0030 08/08/15 0200   08/08/15 0030  piperacillin-tazobactam (ZOSYN) IVPB 3.375 g  Status:  Discontinued     3.375 g 12.5 mL/hr over 240 Minutes Intravenous 4 times per day 08/08/15 0023 08/08/15 0028   08/07/15 1645  piperacillin-tazobactam (ZOSYN) IVPB 3.375 g     3.375 g 100 mL/hr over 30 Minutes Intravenous  Once 08/07/15 1636 08/07/15 1715   08/07/15 1645  vancomycin (VANCOCIN) IVPB 1000 mg/200 mL premix     1,000 mg 200 mL/hr over 60 Minutes Intravenous  Once 08/07/15 1636 08/07/15 1835   08/07/15 1645  clindamycin (CLEOCIN) IVPB 600 mg     600 mg 100 mL/hr over 30 Minutes Intravenous  Once 08/07/15 1636 08/07/15 1905        Assessment/Plan Necrotizing soft tissue infection  POD#6/5 irrigation and debridement of RLE--Dr. Rosendo Gros  -wound is clean, continue with BID wet to dry dressing changes -she does NOT need further antibiotics -stable for discharge to SNF -follow up with Dr. Rosendo Gros 2 weeks    Erby Pian, [facility] Surgery Pager [phone](7A-4:30P) For consults and floor pages call [phone](7A-4:30P)  08/14/2015 10:24 AM

## 2015-08-14 NOTE — Progress Notes (Signed)
Pt. C/o generalized discomfort especially rlq abd. Has had n/v ,and 4 loose stools in past 24hrs. Is presently using Morphine for pain with little to no relief. Copious amts. Drainage from rt. Thigh wound having been changed 3x's using 3 kerlix and 3 abd pads. 179/85-100% 1l/o2 -98.11-28-86

## 2015-08-14 NOTE — Clinical Social Work Note (Signed)
Clinical Social Work Assessment  Patient Details  Name: [name] MRN: [id number] Date of Birth: 1955/10/26  Date of referral:  08/14/15               Reason for consult:  Facility Placement, Discharge Planning                Permission sought to share information with:  Chartered certified accountant granted to share information::  Yes, Advertising account executive::  SNFs   Housing/Transportation Living arrangements for the past 2 months:  Single Family Home Source of Information:  Patient Patient Interpreter Needed:  None Criminal Activity/Legal Involvement Pertinent to Current Situation/Hospitalization:  No - Comment as needed Significant Relationships:  Friend, Other Family Members Lives with:  Friends (Friend- Dwyane Luo) Do you feel safe going back to the place where you live?  Yes Need for family participation in patient care:  Yes (Comment)  Care giving concerns:  Patient lives at home with a friend, but would benefit from care at a SNF for short-term rehab.   Social Worker assessment / plan:  BSW intern met with patient at bedside to complete assessment. Patient was agreeable to SNF and preferred a facility in [facility] close to where she lives. Patient stated that all her friends and family lived in [place] and [place]. BSW intern explained to patient that requesting an LOG bed may limit her options and patient understood that. BSW intern to follow up with bed offers. BSW intern will continue to follow and assist with disposition needs.   Employment status:  Therapist, music:  Managed Care PT Recommendations:  [place] / Referral to community resources:  [place]  Patient/Family's Response to care:  Patient appeared to be satisfied with the care she has been receiving at the hospital.   Patient/Family's Understanding of and Emotional Response to Diagnosis, Current Treatment,  and Prognosis:  Patient seems to understand the reason for her diagnosis, current treatment, and post discharge needs.  Emotional Assessment Appearance:  Appears stated age Attitude/Demeanor/Rapport:   (Pleasant) Affect (typically observed):  Appropriate, Pleasant Orientation:  Oriented to Self, Oriented to Place, Oriented to  Time, Oriented to Situation Alcohol / Substance use:  Not Applicable Psych involvement (Current and /or in the community):  No (Comment)  Discharge Needs  Concerns to be addressed:  No discharge needs identified Readmission within the last 30 days:  No Current discharge risk:  None Barriers to Discharge:  Continued Medical Work up    [employer], [phone]

## 2015-08-14 NOTE — Evaluation (Signed)
Physical Therapy Evaluation Patient Details Name: Sophia EllisonGenevieve A Barham MRN: [id number] DOB: 1955-06-16 Today's Date: 08/14/2015   History of Present Illness  60 y.o. F Hx DM, HTN, Depression, and Obesity who presented to the ED 2/27 with a 6 day hx of nausea, fevers and lightheadedness w/ a lot of swelling and pain of the R inner thigh. CT w contrast showed "aggressive infection/ cellulitis involving R medial upper thigh and groin area w extensive gas in the SQ fat....likely necrotizing fasciitis."Now s/p I&D on 08/11/2015  Clinical Impression  Pt admitted with above diagnosis. Pt currently with functional limitations due to the deficits listed below (see PT Problem List). Pt will benefit from skilled PT to increase their independence and safety with mobility to allow discharge to SNF following her acute stay. Pt ambulating 25 feet with min guard assistance, unsteady patter but no gross loss of balance. Patient reports fatigue as limiting factor.        Follow Up Recommendations SNF;Supervision for mobility/OOB    Equipment Recommendations  None recommended by PT;Other (comment) (to be addressed at next venue)    Recommendations for Other Services       Precautions / Restrictions Precautions Precautions: Fall Restrictions Weight Bearing Restrictions: No      Mobility  Bed Mobility Overal bed mobility: Needs Assistance Bed Mobility: Supine to Sit;Sit to Supine     Supine to sit: Min guard Sit to supine: Min guard   General bed mobility comments: HOB elevated approx. 30 degrees, using rail to assist.  Transfers Overall transfer level: Needs assistance Equipment used: None Transfers: Sit to/from Stand Sit to Stand: Supervision         General transfer comment: Steady with initial transfer from bed.   Ambulation/Gait Ambulation/Gait assistance: Min guard Ambulation Distance (Feet): 25 Feet Assistive device:  (IV pole per patient request) Gait Pattern/deviations:  Step-through pattern;Decreased step length - right;Decreased step length - left Gait velocity: decreased   General Gait Details: slow pattern, mild instability but no gross loss of balance.   Stairs            Wheelchair Mobility    Modified Rankin (Stroke Patients Only)       Balance Overall balance assessment: Needs assistance Sitting-balance support: No upper extremity supported Sitting balance-Leahy Scale: Good     Standing balance support: No upper extremity supported Standing balance-Leahy Scale: Fair Standing balance comment: static standing                             Pertinent Vitals/Pain Pain Assessment: No/denies pain    Home Living Family/patient expects to be discharged to:: Private residence Living Arrangements: Non-relatives/Friends Available Help at Discharge: Friend(s) Type of Home: House Home Access: Stairs to enter Entrance Stairs-Rails: Doctor, general practiceight;Left Entrance Stairs-Number of Steps: 4-5 Home Layout: One level Home Equipment: None      Prior Function Level of Independence: Independent               Hand Dominance        Extremity/Trunk Assessment               Lower Extremity Assessment: Generalized weakness         Communication   Communication: No difficulties  Cognition Arousal/Alertness: Awake/alert Behavior During Therapy: WFL for tasks assessed/performed Overall Cognitive Status: Within Functional Limits for tasks assessed  General Comments      Exercises        Assessment/Plan    PT Assessment Patient needs continued PT services  PT Diagnosis Difficulty walking   PT Problem List Decreased strength;Decreased range of motion;Decreased activity tolerance;Decreased balance;Decreased mobility  PT Treatment Interventions DME instruction;Gait training;Functional mobility training;Therapeutic activities;Therapeutic exercise   PT Goals (Current goals can be found in the  Care Plan section) Acute Rehab PT Goals Patient Stated Goal: get stronger and eventually get back home PT Goal Formulation: With patient Time For Goal Achievement: 08/28/15 Potential to Achieve Goals: Good    Frequency Min 3X/week   Barriers to discharge        Co-evaluation               End of Session Equipment Utilized During Treatment: Gait belt Activity Tolerance: Patient limited by fatigue Patient left: in bed;with call bell/phone within reach Nurse Communication: Mobility status         Time: 1610-9604 PT Time Calculation (min) (ACUTE ONLY): 19 min   Charges:   PT Evaluation $PT Eval Moderate Complexity: 1 Procedure     PT G Codes:        Christiane Ha, PT, CSCS Pager [phone] Office [phone]  08/14/2015, 2:06 PM

## 2015-08-15 LAB — CBC
HEMATOCRIT: 30.5 % — AB (ref 36.0–46.0)
Hemoglobin: 10 g/dL — ABNORMAL LOW (ref 12.0–15.0)
MCH: 31.3 pg (ref 26.0–34.0)
MCHC: 32.8 g/dL (ref 30.0–36.0)
MCV: 95.3 fL (ref 78.0–100.0)
Platelets: 255 10*3/uL (ref 150–400)
RBC: 3.2 MIL/uL — ABNORMAL LOW (ref 3.87–5.11)
RDW: 14.7 % (ref 11.5–15.5)
WBC: 12.5 10*3/uL — AB (ref 4.0–10.5)

## 2015-08-15 LAB — GLUCOSE, CAPILLARY
GLUCOSE-CAPILLARY: 101 mg/dL — AB (ref 65–99)
GLUCOSE-CAPILLARY: 116 mg/dL — AB (ref 65–99)
GLUCOSE-CAPILLARY: 128 mg/dL — AB (ref 65–99)

## 2015-08-15 LAB — POTASSIUM: Potassium: 3.2 mmol/L — ABNORMAL LOW (ref 3.5–5.1)

## 2015-08-15 MED ORDER — POTASSIUM CHLORIDE CRYS ER 20 MEQ PO TBCR
40.0000 meq | EXTENDED_RELEASE_TABLET | ORAL | Status: AC
  Administered 2015-08-15 (×2): 40 meq via ORAL
  Filled 2015-08-15 (×2): qty 2

## 2015-08-15 MED ORDER — SACCHAROMYCES BOULARDII 250 MG PO CAPS: 250.0000 mg | ORAL_CAPSULE | Freq: Two times a day (BID) | ORAL | Status: DC

## 2015-08-15 MED ORDER — INSULIN GLARGINE 100 UNIT/ML ~~LOC~~ SOLN: 18.0000 [IU] | Freq: Two times a day (BID) | SUBCUTANEOUS | Status: DC

## 2015-08-15 MED ORDER — OXYCODONE HCL 5 MG PO TABS: 5.0000 mg | ORAL_TABLET | ORAL | Status: DC | PRN

## 2015-08-15 MED ORDER — MAGNESIUM SULFATE IN D5W 10-5 MG/ML-% IV SOLN
1.0000 g | Freq: Once | INTRAVENOUS | Status: AC
  Administered 2015-08-15: 1 g via INTRAVENOUS
  Filled 2015-08-15: qty 100

## 2015-08-15 MED ORDER — INSULIN ASPART 100 UNIT/ML ~~LOC~~ SOLN: SUBCUTANEOUS | Status: DC

## 2015-08-15 MED ORDER — CLINDAMYCIN HCL 150 MG PO CAPS: 450.0000 mg | ORAL_CAPSULE | Freq: Three times a day (TID) | ORAL | Status: DC

## 2015-08-15 NOTE — Progress Notes (Signed)
Gen. Surgery:  Please see our detailed note from yesterday. Please see photography of wound from yesterday  Wound is stable and does not require any further debridement. There is no active infection She does not require antibiotics She may be discharged to SNF today She will need twice a day wet-to-dry dressing changes She will follow-up with Dr. Derrell Lollingamirez in 2 weeks in our office.  Please call us if you have any further questions.  Angelia MouldHaywood M. Derrell LollingIngram, M.D., [facility] General and Minimally invasive Surgery Acute care surgery Breast surgery

## 2015-08-15 NOTE — Clinical Social Work Placement (Signed)
   CLINICAL SOCIAL WORK PLACEMENT  NOTE  Date:  08/15/2015  Patient Details  Name: Sophia EllisonGenevieve A Abboud MRN: [id number] Date of Birth: Feb 02, 1956  Clinical Social Work is seeking post-discharge placement for this patient at the Skilled  Nursing Facility level of care (*CSW will initial, date and re-position this form in  chart as items are completed):  Yes   Patient/family provided with [facility] Clinical Social Work Department's list of facilities offering this level of care within the geographic area requested by the patient (or if unable, by the patient's family).  Yes   Patient/family informed of their freedom to choose among providers that offer the needed level of care, that participate in Medicare, Medicaid or managed care program needed by the patient, have an available bed and are willing to accept the patient.  Yes   Patient/family informed of [facility]'s ownership interest [facility] and [facility], as well as of the fact that they are under no obligation to receive care at these facilities.  PASRR submitted to EDS on       PASRR number received on  Existing PASRR number confirmed on  08/14/15  FL2 transmitted to all facilities in geographic area requested by pt/family on 08/14/15     FL2 transmitted to all facilities within larger geographic area on       Patient informed that his/her managed care company has contracts with or will negotiate with certain facilities, including the following:        Yes   Patient/family informed of bed offers received.  [facility]     Physician recommends and patient chooses bed at      [facility] 08[facility].  Patient to be transferred to facility by [facility]     Patient family notified on 08/15/15 of transfer.  Name of family member notified:        PHYSICIAN       Additional Comment:   Per MD, patient [facility]. RN,  patient/family, and facility notified of patient's discharge. RN given number for report. DC packet on chart. Ambulance transport requested. Family has completed necessary paperwork. BSW intern signing off.   Jenita SeashoreMaggie Travin Marik BSW Intern, [phone]

## 2015-08-15 NOTE — Discharge Instructions (Signed)
Follow with Primary MD TAPPER,[name], MD in 3-4 days   Get CBC, CMP, 2 view Chest X ray checked  by Primary MD next visit.    Activity: As tolerated with Full fall precautions use walker/cane & assistance as needed   Disposition SNF   Diet:   Heart Healthy Low Carb.  Accuchecks 4 times/day, Once in AM empty stomach and then before each meal. Log in all results and show them to your Prim.MD in 3 days. If any glucose reading is under 80 or above 300 call your Prim MD immidiately. Follow Low glucose instructions for glucose under 80 as instructed.   For Heart failure patients - Check your Weight same time everyday, if you gain over 2 pounds, or you develop in leg swelling, experience more shortness of breath or chest pain, call your Primary MD immediately. Follow Cardiac Low Salt Diet and 1.5 lit/day fluid restriction.   On your next visit with your primary care physician please Get Medicines reviewed and adjusted.   Please request your Prim.MD to go over all Hospital Tests and Procedure/Radiological results at the follow up, please get all Hospital records sent to your Prim MD by signing hospital release before you go home.   If you experience worsening of your admission symptoms, develop shortness of breath, life threatening emergency, suicidal or homicidal thoughts you must seek medical attention immediately by calling 911 or calling your MD immediately  if symptoms less severe.  You Must read complete instructions/literature along with all the possible adverse reactions/side effects for all the Medicines you take and that have been prescribed to you. Take any new Medicines after you have completely understood and accpet all the possible adverse reactions/side effects.   Do not drive, operating heavy machinery, perform activities at heights, swimming or participation in water activities or provide baby sitting services if your were admitted for syncope or siezures until you have seen  by Primary MD or a Neurologist and advised to do so again.  Do not drive when taking Pain medications.    Do not take more than prescribed Pain, Sleep and Anxiety Medications  Special Instructions: If you have smoked or chewed Tobacco  in the last 2 yrs please stop smoking, stop any regular Alcohol  and or any Recreational drug use.  Wear Seat belts while driving.   Please note  You were cared for by a hospitalist during your hospital stay. If you have any questions about your discharge medications or the care you received while you were in the hospital after you are discharged, you can call the unit and asked to speak with the hospitalist on call if the hospitalist that took care of you is not available. Once you are discharged, your primary care physician will handle any further medical issues. Please note that NO REFILLS for any discharge medications will be authorized once you are discharged, as it is imperative that you return to your primary care physician (or establish a relationship with a primary care physician if you do not have one) for your aftercare needs so that they can reassess your need for medications and monitor your lab values.

## 2015-08-15 NOTE — Progress Notes (Signed)
[facility], report was given to nurse Ball CorporationLynn Clipton.

## 2015-08-15 NOTE — Discharge Summary (Signed)
[name], is a 60 y.o. female  DOB Mar 19, 1956  MRN [id number].  Admission date:  08/07/2015  Admitting Physician  Delano Metz, MD  Discharge Date:  08/15/2015   Primary MD  [name], MD  Recommendations for primary care physician for things to follow:   Check CBC, BMP, Magnesium in 2-3 days   Admission Diagnosis  Necrotizing fasciitis (HCC) [M72.6] Acute renal insufficiency [N28.9] Preop testing [Z01.818]   Discharge Diagnosis  Necrotizing fasciitis (HCC) [M72.6] Acute renal insufficiency [N28.9] Preop testing [Z01.818]     Principal Problem:   Necrotizing fasciitis (HCC) of inner thigh Active Problems:   IDDM (insulin dependent diabetes mellitus) (HCC)   History of hypertension   Hypotension   Acute renal insufficiency   Endotracheal tube present   Acute respiratory failure [facility])      Past Medical History  Diagnosis Date  . Diabetes mellitus without complication (HCC)   . Hypertension     Past Surgical History  Procedure Laterality Date  . Incision and drainage abscess Right 08/08/2015    Procedure: Irrigation and Debridement of Right Lower Ext.;  Surgeon: Axel Filler, MD;  Location: [facility] OR;  Service: General;  Laterality: Right;  . Incision and drainage Right 08/09/2015    Procedure: INCISION AND DRAINAGE;  Surgeon: De Blanch Kinsinger, MD;  Location: [facility] OR;  Service: General;  Laterality: Right;       HPI  from the history and physical done on the day of admission:     60 y.o. F Hx DM, HTN, Depression, and Obesity who presented to the ED 2/27 with a 6 day hx of nausea, fevers and lightheadedness w/ a lot of swelling and pain of the R inner thigh. She has a hx of "boils" in and around her groin in the past. She had temps up to 102 most every day x 6 days, and was not  able to eat much.   In the ED WBC 15.8, temp 98.9, BP 99/45 and O2 95% on 2L [place]. Patient found to have large amount swelling R upper / medial thigh. CT w contrast showed "aggressive infection/ cellulitis involving R medial upper thigh and groin area w extensive gas in the SQ fat....likely necrotizing fasciitis." She was subsequently transfered to [facility] and evaluated by Gen Surgery.   She was transferred under my care on 08/13/2015 on day 6 of her hospital stay.    Hospital Course:    Coag neg Staph Necrotizing fasciitis Rt thigh w/ severe sepsis   Now s/p I&D on 08/11/2015 - cultures noted, received IV clindamycin while she was here, discussed with general surgery on 08/13/2015. Follow-up in their office. Wound care instructions per general surgery - (Wet to dry dressing using normal saline, BID. Use Kerlix, as wet portion, NS for wetting agent, ABD for dry portion, and paper tape as much as possible. Minimize damage to skin).  Discussed with ID physician Dr. Rosetta Posner on 08/13/2015, continue on PO clindamycin only, switched to oral Clindamycin with stop date of 08/23/2015. Clinically  much better and cellulitis/skin infection almost resolved.   Anemia of critical illness Hgb stable - no indication of acute blood loss  Hypernatremia Resolved with hydration.  HTN BP poorly controlled at this time - adjust medical tx and follow   Acute renal insuff Likely due to sepsis now resolved.   Hypokalemia. Replaced we'll monitor.  Morbid obesity - Body mass index is 45.51 kg/(m^2). Follow with PCP. Weight reduction.  DM2 - CBG much better - due to necrotizing fasciitis tight glycemic control, monitor CBGs every before meals at bedtime adjust as needed.  CBG (last 3)   Recent Labs  08/14/15 1700 08/14/15 2051 08/15/15 0732  GLUCAP 73 94 101*       Discharge Condition: Stable  Follow UP  Follow-up Information    Follow up with Lajean Saver, MD. Schedule an appointment  as soon as possible for a visit in 2 weeks.   Specialty:  General Surgery   Contact information:   [address] [place] [address] [phone]       Follow up with TAPPER,[name], MD. Schedule an appointment as soon as possible for a visit in 3 days.   Specialty:  Family Medicine   Contact information:   [address] Raeanne Gathers [place] [address] [phone]        Consults obtained - Surgery  Diet and Activity recommendation: See Discharge Instructions below  Discharge Instructions           Discharge Instructions    Discharge instructions    Complete by:  As directed   Follow with Primary MD TAPPER,[name], MD in 3-4 days   Get CBC, CMP, 2 view Chest X ray checked  by Primary MD next visit.    Activity: As tolerated with Full fall precautions use walker/cane & assistance as needed   Disposition SNF   Diet:   Heart Healthy Low Carb.  Accuchecks 4 times/day, Once in AM empty stomach and then before each meal. Log in all results and show them to your Prim.MD in 3 days. If any glucose reading is under 80 or above 300 call your Prim MD immidiately. Follow Low glucose instructions for glucose under 80 as instructed.   For Heart failure patients - Check your Weight same time everyday, if you gain over 2 pounds, or you develop in leg swelling, experience more shortness of breath or chest pain, call your Primary MD immediately. Follow Cardiac Low Salt Diet and 1.5 lit/day fluid restriction.   On your next visit with your primary care physician please Get Medicines reviewed and adjusted.   Please request your Prim.MD to go over all Hospital Tests and Procedure/Radiological results at the follow up, please get all Hospital records sent to your Prim MD by signing hospital release before you go home.   If you experience worsening of your admission symptoms, develop shortness of breath, life threatening emergency, suicidal or homicidal thoughts you must seek  medical attention immediately by calling 911 or calling your MD immediately  if symptoms less severe.  You Must read complete instructions/literature along with all the possible adverse reactions/side effects for all the Medicines you take and that have been prescribed to you. Take any new Medicines after you have completely understood and accpet all the possible adverse reactions/side effects.   Do not drive, operating heavy machinery, perform activities at heights, swimming or participation in water activities or provide baby sitting services if your were admitted for syncope or siezures  until you have seen by Primary MD or a Neurologist and advised to do so again.  Do not drive when taking Pain medications.    Do not take more than prescribed Pain, Sleep and Anxiety Medications  Special Instructions: If you have smoked or chewed Tobacco  in the last 2 yrs please stop smoking, stop any regular Alcohol  and or any Recreational drug use.  Wear Seat belts while driving.   Please note  You were cared for by a hospitalist during your hospital stay. If you have any questions about your discharge medications or the care you received while you were in the hospital after you are discharged, you can call the unit and asked to speak with the hospitalist on call if the hospitalist that took care of you is not available. Once you are discharged, your primary care physician will handle any further medical issues. Please note that NO REFILLS for any discharge medications will be authorized once you are discharged, as it is imperative that you return to your primary care physician (or establish a relationship with a primary care physician if you do not have one) for your aftercare needs so that they can reassess your need for medications and monitor your lab values.     Increase activity slowly    Complete by:  As directed              Discharge Medications       Medication List    STOP taking  these medications        metFORMIN 1000 MG tablet  Commonly known as:  GLUCOPHAGE     NOVOLIN 70/30 RELION (70-30) 100 UNIT/ML injection  Generic drug:  insulin NPH-regular Human      TAKE these medications        acetaminophen 500 MG tablet  Commonly known as:  TYLENOL  Take 500 mg by mouth every 6 (six) hours as needed for mild pain or moderate pain.     aspirin EC 81 MG tablet  Take 81 mg by mouth daily.     clindamycin 150 MG capsule  Commonly known as:  CLEOCIN  Take 3 capsules (450 mg total) by mouth every 8 (eight) hours. Stop date 08/23/2015     FARXIGA 10 MG Tabs tablet  Generic drug:  dapagliflozin propanediol  Take 10 mg by mouth daily.     FLUoxetine 20 MG capsule  Commonly known as:  PROZAC  Take 20 mg by mouth 2 (two) times daily.     insulin aspart 100 UNIT/ML injection  Commonly known as:  novoLOG  Before each meal 3 times a day, 140-199 - 2 units, 200-250 - 4 units, 251-299 - 6 units,  300-349 - 8 units,  350 or above 10 units.     insulin glargine 100 UNIT/ML injection  Commonly known as:  LANTUS  Inject 0.18 mLs (18 Units total) into the skin 2 (two) times daily.     lisinopril-hydrochlorothiazide 20-25 MG tablet  Commonly known as:  PRINZIDE,ZESTORETIC  Take 1 tablet by mouth daily.     metoprolol 50 MG tablet  Commonly known as:  LOPRESSOR  Take 50 mg by mouth 2 (two) times daily.     NIFEdipine 30 MG 24 hr tablet  Commonly known as:  PROCARDIA-XL/ADALAT CC  Take 30 mg by mouth daily.     oxyCODONE 5 MG immediate release tablet  Commonly known as:  Oxy IR/ROXICODONE  Take 1-2 tablets (5-10 mg total) by mouth  every 4 (four) hours as needed for moderate pain.     pravastatin 40 MG tablet  Commonly known as:  PRAVACHOL  Take 40 mg by mouth daily.     saccharomyces boulardii 250 MG capsule  Commonly known as:  FLORASTOR  Take 1 capsule (250 mg total) by mouth 2 (two) times daily.        Major procedures and Radiology Reports - PLEASE  review detailed and final reports for all details, in brief -       Ct Pelvis W Contrast  08/07/2015  CLINICAL DATA:  Pain, swelling and redness involving the right thigh and groin area. EXAM: CT PELVIS WITH CONTRAST TECHNIQUE: Multidetector CT imaging of the pelvis was performed using the standard protocol following the bolus administration of intravenous contrast. CONTRAST:  80mL OMNIPAQUE IOHEXOL 300 MG/ML  SOLN COMPARISON:  None. FINDINGS: There is a fairly extensive infectious process involving the right medial upper thigh and groin area extending along anterior aspect of the right pelvis. There is an extensive amount of gas in the soft tissues, inflammation/edema/fluid is skin thickening. Findings are worrisome for necrotizing fasciitis-cellulitis. I do not see a discrete drainable abscess. There is marked inflammation involving the deep muscular fascia but I do not see any obvious myofasciitis or pyomyositis. No findings for septic arthritis or osteomyelitis. No significant intra pelvic abnormalities. Uterine fibroids are noted. Bladder is normal. There are borderline enlarged/ inflamed inguinal lymph nodes, right greater than left. Moderate atherosclerotic calcifications involving the aorta, iliac and common femoral arteries. No findings for deep venous thrombosis in the upper thighs were groin. There is an anterior abdominal wall hernia containing fat and part of the transverse colon. IMPRESSION: Aggressive appearing infection/cellulitis involving right medial upper thigh and groin area with extensive gas in the subcutaneous fat. This is likely necrotizing fasciitis/cellulitis. No discrete drainable abscess or findings for deep compartment involvement. No findings for septic arthritis or osteomyelitis. Electronically Signed   By: Rudie MeyerP.  Gallerani M.D.   On: 08/07/2015 17:56   Dg Chest Port 1 View  08/10/2015  CLINICAL DATA:  Intubated patient, acute respiratory failure, necrotizing fasciitis status  post incision and drainage, current smoker. EXAM: PORTABLE CHEST 1 VIEW COMPARISON:  Portable chest x-ray dated August 08, 2015 FINDINGS: The lung volumes are low. There is right perihilar interstitial infiltrate. There is no pleural effusion. The cardiac silhouette remains enlarged. The central pulmonary vascularity is prominent. The endotracheal tube tip lies 2.9 cm above the carina. The esophagogastric tube tip projects below the inferior margin of the image. IMPRESSION: Persistent hypo inflation. Somewhat increased right perihilar interstitial infiltrate. Low-grade CHF, stable. Electronically Signed   By: David  SwazilandJordan M.D.   On: 08/10/2015 07:29   Dg Chest Port 1 View  08/08/2015  CLINICAL DATA:  Acute respiratory failure. Endotracheal tube placement. EXAM: PORTABLE CHEST 1 VIEW COMPARISON:  08/06/2010 FINDINGS: A new endotracheal tube is seen with tip approximately 2 cm above the carina. A new nasogastric tube is seen with tip in the stomach. Improved aeration of both lungs is demonstrated. There is no evidence of pulmonary consolidation, pleural effusion, or pneumothorax. IMPRESSION: Endotracheal tube and nasogastric tube in appropriate position. Improved aeration of both lungs. No evidence of consolidation or effusion. Electronically Signed   By: Myles RosenthalJohn  Stahl M.D.   On: 08/08/2015 14:31   Dg Chest Port 1 View  08/07/2015  CLINICAL DATA:  [age] year old female with abscess on the leg. Preop evaluation EXAM: PORTABLE CHEST 1 VIEW COMPARISON:  Radiograph dated  08/22/2014 FINDINGS: Single portable view of the chest demonstrate hypovolemic lungs. There is no focal consolidation, pleural effusion, or pneumothorax. Top-normal cardiac silhouette. No acute osseous pathology. IMPRESSION: No acute cardiopulmonary process. Electronically Signed   By: Elgie Collard M.D.   On: 08/07/2015 21:27    Micro Results      Recent Results (from the past 240 hour(s))  Culture, routine-abscess     Status: None    Collection Time: 08/07/15  4:40 PM  Result Value Ref Range Status   Specimen Description ABSCESS RIGHT INNER THIGH  Final   Special Requests NONE  Final   Gram Stain   Final    MODERATE WBC PRESENT,BOTH PMN AND MONONUCLEAR NO SQUAMOUS EPITHELIAL CELLS SEEN MODERATE GRAM POSITIVE COCCI IN PAIRS Performed at [facility]    Culture   Final    NO GROWTH 2 DAYS Performed at [facility]    Report Status 08/10/2015 FINAL  Final  MRSA PCR Screening     Status: None   Collection Time: 08/08/15 12:11 AM  Result Value Ref Range Status   MRSA by PCR NEGATIVE NEGATIVE Final    Comment:        The GeneXpert MRSA Assay (FDA approved for NASAL specimens only), is one component of a comprehensive MRSA colonization surveillance program. It is not intended to diagnose MRSA infection nor to guide or monitor treatment for MRSA infections.   Culture, routine-abscess     Status: None   Collection Time: 08/08/15 12:21 PM  Result Value Ref Range Status   Specimen Description ABSCESS RIGHT THIGH  Final   Special Requests NONE  Final   Gram Stain   Final    FEW WBC PRESENT, PREDOMINANTLY PMN NO SQUAMOUS EPITHELIAL CELLS SEEN FEW GRAM POSITIVE COCCI IN PAIRS Performed at [facility]    Culture   Final    FEW STAPHYLOCOCCUS SPECIES (COAGULASE NEGATIVE) Culture reincubated for better growth Performed at [facility]    Report Status 08/11/2015 FINAL  Final  Anaerobic culture     Status: None   Collection Time: 08/08/15 12:21 PM  Result Value Ref Range Status   Specimen Description ABSCESS RIGHT THIGH  Final   Special Requests NONE  Final   Gram Stain   Final    FEW WBC PRESENT, PREDOMINANTLY PMN NO SQUAMOUS EPITHELIAL CELLS SEEN FEW GRAM POSITIVE COCCI IN PAIRS Performed at [facility]    Culture   Final    NO ANAEROBES ISOLATED Performed at [facility]    Report Status 08/13/2015 FINAL  Final    Today   Subjective     Mady Gemma today has no headache,no chest abdominal pain,no new weakness tingling or numbness, feels much better  .     Objective   Blood pressure 161/75, pulse 83, temperature 99.1 F (37.3 C), temperature source Oral, resp. rate 18, height  (1.6 m), weight 116.5 kg (256 lb 13.4 oz), SpO2 98 %.   Intake/Output Summary (Last 24 hours) at 08/15/15 0943 Last data filed at 08/15/15 0915  Gross per 24 hour  Intake   1040 ml  Output   1225 ml  Net   -185 ml    Exam Awake Alert, Oriented x 3, No new F.N deficits, Normal affect [place].AT,PERRAL Supple Neck,No JVD, No cervical lymphadenopathy appriciated.  Symmetrical Chest wall movement, Good air movement bilaterally, CTAB RRR,No Gallops,Rubs or new Murmurs, No Parasternal Heave +ve B.Sounds, Abd Soft, Non tender, No organomegaly appriciated,  No rebound -guarding or rigidity. No Cyanosis, Clubbing or edema, No new Rash or bruise, R thigh post I&D site stable.   Data Review   CBC w Diff:  Lab Results  Component Value Date   WBC 12.5* 08/15/2015   HGB 10.0* 08/15/2015   HCT 30.5* 08/15/2015   PLT 255 08/15/2015   LYMPHOPCT 9 08/07/2015   MONOPCT 5 08/07/2015   EOSPCT 0 08/07/2015   BASOPCT 0 08/07/2015    CMP:  Lab Results  Component Value Date   NA 143 08/14/2015   K 3.2* 08/15/2015   CL 103 08/14/2015   CO2 30 08/14/2015   BUN 10 08/14/2015   CREATININE 1.16* 08/14/2015   PROT 6.6 08/08/2015   PROT 6.6 08/08/2015   ALBUMIN 2.2* 08/08/2015   ALBUMIN 2.1* 08/08/2015   BILITOT 0.7 08/08/2015   BILITOT 0.9 08/08/2015   ALKPHOS 126 08/08/2015   ALKPHOS 106 08/08/2015   AST 15 08/08/2015   AST 14* 08/08/2015   ALT 12* 08/08/2015   ALT 12* 08/08/2015  .   Total Time in preparing paper work, data evaluation and todays exam - 35 minutes  Leroy Sea M.D on 08/15/2015 at 9:43 AM  [facility]   Office  [phone]

## 2015-08-18 ENCOUNTER — Emergency Department (HOSPITAL_COMMUNITY)
Admission: EM | Admit: 2015-08-18 | Discharge: 2015-08-18 | Disposition: A | Discharge status: Home / Self Care | Payer: BLUE CROSS/BLUE SHIELD | Attending: Emergency Medicine | Admitting: Emergency Medicine

## 2015-08-18 ENCOUNTER — Encounter (HOSPITAL_COMMUNITY): Payer: Self-pay | Admitting: Emergency Medicine

## 2015-08-18 DIAGNOSIS — Z7982 Long term (current) use of aspirin: Secondary | ICD-10-CM | POA: Insufficient documentation

## 2015-08-18 DIAGNOSIS — I10 Essential (primary) hypertension: Secondary | ICD-10-CM | POA: Diagnosis not present

## 2015-08-18 DIAGNOSIS — E119 Type 2 diabetes mellitus without complications: Secondary | ICD-10-CM | POA: Insufficient documentation

## 2015-08-18 DIAGNOSIS — Z4801 Encounter for change or removal of surgical wound dressing: Secondary | ICD-10-CM

## 2015-08-18 DIAGNOSIS — Z794 Long term (current) use of insulin: Secondary | ICD-10-CM | POA: Diagnosis not present

## 2015-08-18 DIAGNOSIS — Z792 Long term (current) use of antibiotics: Secondary | ICD-10-CM | POA: Insufficient documentation

## 2015-08-18 DIAGNOSIS — F17210 Nicotine dependence, cigarettes, uncomplicated: Secondary | ICD-10-CM | POA: Insufficient documentation

## 2015-08-18 DIAGNOSIS — Z7984 Long term (current) use of oral hypoglycemic drugs: Secondary | ICD-10-CM | POA: Diagnosis not present

## 2015-08-18 DIAGNOSIS — Z79899 Other long term (current) drug therapy: Secondary | ICD-10-CM | POA: Insufficient documentation

## 2015-08-18 NOTE — ED Provider Notes (Signed)
CSN: [id number]     Arrival date & time 08/18/15  1805 History   First MD Initiated Contact with Patient 08/18/15 1837     Chief Complaint  Patient presents with  . Wound Check     (Consider location/radiation/quality/duration/timing/severity/associated sxs/prior Treatment) HPI   [name] is a 60 y.o. female who presents to the Emergency Department requesting dressing change to a surgical wound to the right upper thigh.  She states that she was discharged from the hospital on 08/15/15 after surgical debridement and irrigation of Necrotizing fasciitis soft tissue infection to RUE.  She states that home health was arranged prior to hospital d/c, but she states they have not shown up to her home.  She contacted her doctor and advised him, she states that she was told the problem has been corrected.  She states that she is currently taking antibiotics and she states pain is controlled with medications.  She denies increased pain, fever, chills or redness of the wound.   Past Medical History  Diagnosis Date  . Diabetes mellitus without complication (HCC)   . Hypertension    Past Surgical History  Procedure Laterality Date  . Incision and drainage abscess Right 08/08/2015    Procedure: Irrigation and Debridement of Right Lower Ext.;  Surgeon: Axel Filler, MD;  Location: [facility] OR;  Service: General;  Laterality: Right;  . Incision and drainage Right 08/09/2015    Procedure: INCISION AND DRAINAGE;  Surgeon: Rodman Pickle, MD;  Location: [facility] OR;  Service: General;  Laterality: Right;   History reviewed. No pertinent family history. Social History  Substance Use Topics  . Smoking status: Current Some Day Smoker    Types: Cigarettes  . Smokeless tobacco: None  . Alcohol Use: Yes     Comment: occ.   OB History    No data available     Review of Systems  Constitutional: Negative for fever and chills.  Gastrointestinal: Negative for nausea, vomiting, abdominal pain and  diarrhea.  Musculoskeletal: Negative for back pain, joint swelling and arthralgias.  Skin: Positive for wound.       Surgical wound right thigh  Neurological: Negative for dizziness, weakness and numbness.  Hematological: Does not bruise/bleed easily.  All other systems reviewed and are negative.     Allergies  Penicillins  Home Medications   Prior to Admission medications   Medication Sig Start Date End Date Taking? Authorizing Provider  acetaminophen (TYLENOL) 500 MG tablet Take 500 mg by mouth every 6 (six) hours as needed for mild pain or moderate pain.   Yes Historical Provider, MD  aspirin EC 81 MG tablet Take 81 mg by mouth daily.   Yes Historical Provider, MD  clindamycin (CLEOCIN) 150 MG capsule Take 3 capsules (450 mg total) by mouth every 8 (eight) hours. Stop date 08/23/2015 08/15/15  Yes Leroy Sea, MD  FARXIGA 10 MG TABS tablet Take 10 mg by mouth daily. 07/23/15  Yes Historical Provider, MD  FLUoxetine (PROZAC) 20 MG capsule Take 20 mg by mouth 2 (two) times daily.  05/11/15  Yes Historical Provider, MD  insulin aspart (NOVOLOG) 100 UNIT/ML injection Before each meal 3 times a day, 140-199 - 2 units, 200-250 - 4 units, 251-299 - 6 units,  300-349 - 8 units,  350 or above 10 units. 08/15/15  Yes Leroy Sea, MD  insulin glargine (LANTUS) 100 UNIT/ML injection Inject 0.18 mLs (18 Units total) into the skin 2 (two) times daily. 08/15/15  Yes Prashant K  Thedore MinsSingh, MD  lisinopril-hydrochlorothiazide (PRINZIDE,ZESTORETIC) 20-25 MG tablet Take 1 tablet by mouth daily. 05/11/15  Yes Historical Provider, MD  metoprolol (LOPRESSOR) 50 MG tablet Take 50 mg by mouth 2 (two) times daily. 05/11/15  Yes Historical Provider, MD  NIFEdipine (PROCARDIA-XL/ADALAT CC) 30 MG 24 hr tablet Take 30 mg by mouth daily. 05/11/15  Yes Historical Provider, MD  oxyCODONE (OXY IR/ROXICODONE) 5 MG immediate release tablet Take 1-2 tablets (5-10 mg total) by mouth every 4 (four) hours as needed for moderate  pain. 08/15/15  Yes Leroy SeaPrashant K Singh, MD  pravastatin (PRAVACHOL) 40 MG tablet Take 40 mg by mouth daily. 05/11/15  Yes Historical Provider, MD  saccharomyces boulardii (FLORASTOR) 250 MG capsule Take 1 capsule (250 mg total) by mouth 2 (two) times daily. 08/15/15  Yes Leroy SeaPrashant K Singh, MD   BP 112/62 mmHg  Pulse 92  Temp(Src) 98.9 F (37.2 C)  Resp 18  Ht 5\' 3"  (1.6 m)  Wt 103.42 kg  BMI 40.40 kg/m2  SpO2 100% Physical Exam  Constitutional: She is oriented to person, place, and time. She appears well-developed and well-nourished. No distress.  HENT:  Head: Normocephalic and atraumatic.  Cardiovascular: Normal rate, regular rhythm, normal heart sounds and intact distal pulses.   No murmur heard. Pulmonary/Chest: Effort normal and breath sounds normal. No respiratory distress.  Abdominal: Soft. She exhibits no distension. There is no tenderness. There is no rebound and no guarding.  Musculoskeletal: She exhibits no edema or tenderness.  Neurological: She is alert and oriented to person, place, and time. She exhibits normal muscle tone. Coordination normal.  Skin: Skin is warm.     Very large, open surgical wound to the to upper right thigh.  No packing present.  Mild to moderate purulent drainage to the wound.  No surrounding erythema.  Wound edges appear clean.    Psychiatric: She has a normal mood and affect.  Nursing note and vitals reviewed.   ED Course  Procedures (including critical care time) Labs Review Labs Reviewed - No data to display  Imaging Review No results found. I have personally reviewed and evaluated these images and lab results as part of my medical decision-making.   EKG Interpretation None      MDM   Final diagnoses:  Dressing change or removal, surgical wound    Previous notes reviewed.  Hx of nec fasciitis with surgical debridement.  She is well appearing, vitals stable.  Wound looks good, no surrounding erythema, wound edges clean.  Here for wound  dressing.     Pt also seen by Dr. Jodi MourningZavitz.  Will do wet to dry dressing as recommended in post op note.  Pt states that she contacted her PMD's office today to make them aware that home health did not show up and she agrees to return here for dressing changes if needed. Advised of importance of proper f/u    Pauline Ausammy Narvel Kozub, PA-C 08/21/15 2307  Blane OharaJoshua Zavitz, MD 08/23/15 [phone]

## 2015-08-18 NOTE — ED Provider Notes (Signed)
Medical screening examination/treatment/procedure(s) were conducted as a shared visit with non-physician practitioner(s) or resident  and myself.  I personally evaluated the patient during the encounter   I have personally reviewed any xrays and/ or EKG's with the provider and I agree with interpretation.   No diagnosis found.  Patient presents for wound check. Patient had recent necrotizing infection requiring significant surgery to the proximal medial right thigh. Wound does appear to be healing well no surrounding spreading erythema, excellent blood flow to the tissue surrounding, no fevers or chills, patient well-appearing otherwise. Patient has wound care/nursing checks at home however they did not show up better supposed to come tomorrow. Strict reasons to return given. Patient taking oral anabiotic as directed.  Blane OharaJoshua Rifka Ramey, MD 08/21/15 [phone]

## 2015-08-18 NOTE — ED Notes (Signed)
Pt here for dressing change to right groin. Pt states she was [facility] [place] yesterday with home health to follow up today. Pt states home health did not come today and she needs dressing change.

## 2015-09-08 ENCOUNTER — Inpatient Hospital Stay (HOSPITAL_COMMUNITY)
Admission: AD | Admit: 2015-09-08 | Discharge: 2015-09-21 | DRG: 682 | Disposition: A | Discharge status: Home Health | Payer: BLUE CROSS/BLUE SHIELD | Source: Other Acute Inpatient Hospital | Attending: Internal Medicine | Admitting: Internal Medicine

## 2015-09-08 ENCOUNTER — Telehealth: Payer: Self-pay | Admitting: Family Medicine

## 2015-09-08 DIAGNOSIS — Z452 Encounter for adjustment and management of vascular access device: Secondary | ICD-10-CM

## 2015-09-08 DIAGNOSIS — L89313 Pressure ulcer of right buttock, stage 3: Secondary | ICD-10-CM

## 2015-09-08 DIAGNOSIS — L309 Dermatitis, unspecified: Secondary | ICD-10-CM | POA: Diagnosis not present

## 2015-09-08 DIAGNOSIS — L89323 Pressure ulcer of left buttock, stage 3: Secondary | ICD-10-CM | POA: Diagnosis not present

## 2015-09-08 DIAGNOSIS — E119 Type 2 diabetes mellitus without complications: Secondary | ICD-10-CM | POA: Diagnosis present

## 2015-09-08 DIAGNOSIS — B9689 Other specified bacterial agents as the cause of diseases classified elsewhere: Secondary | ICD-10-CM | POA: Diagnosis present

## 2015-09-08 DIAGNOSIS — R34 Anuria and oliguria: Secondary | ICD-10-CM | POA: Diagnosis present

## 2015-09-08 DIAGNOSIS — L26 Exfoliative dermatitis: Secondary | ICD-10-CM | POA: Diagnosis present

## 2015-09-08 DIAGNOSIS — E870 Hyperosmolality and hypernatremia: Secondary | ICD-10-CM | POA: Diagnosis present

## 2015-09-08 DIAGNOSIS — F17210 Nicotine dependence, cigarettes, uncomplicated: Secondary | ICD-10-CM | POA: Diagnosis not present

## 2015-09-08 DIAGNOSIS — N170 Acute kidney failure with tubular necrosis: Principal | ICD-10-CM | POA: Diagnosis present

## 2015-09-08 DIAGNOSIS — IMO0001 Reserved for inherently not codable concepts without codable children: Secondary | ICD-10-CM

## 2015-09-08 DIAGNOSIS — E871 Hypo-osmolality and hyponatremia: Secondary | ICD-10-CM | POA: Diagnosis present

## 2015-09-08 DIAGNOSIS — D638 Anemia in other chronic diseases classified elsewhere: Secondary | ICD-10-CM | POA: Diagnosis present

## 2015-09-08 DIAGNOSIS — N179 Acute kidney failure, unspecified: Secondary | ICD-10-CM | POA: Diagnosis present

## 2015-09-08 DIAGNOSIS — R011 Cardiac murmur, unspecified: Secondary | ICD-10-CM | POA: Diagnosis not present

## 2015-09-08 DIAGNOSIS — Z794 Long term (current) use of insulin: Secondary | ICD-10-CM

## 2015-09-08 DIAGNOSIS — Z7982 Long term (current) use of aspirin: Secondary | ICD-10-CM | POA: Diagnosis not present

## 2015-09-08 DIAGNOSIS — Z79899 Other long term (current) drug therapy: Secondary | ICD-10-CM

## 2015-09-08 DIAGNOSIS — I10 Essential (primary) hypertension: Secondary | ICD-10-CM | POA: Diagnosis present

## 2015-09-08 DIAGNOSIS — I361 Nonrheumatic tricuspid (valve) insufficiency: Secondary | ICD-10-CM | POA: Diagnosis not present

## 2015-09-08 DIAGNOSIS — L538 Other specified erythematous conditions: Secondary | ICD-10-CM | POA: Diagnosis not present

## 2015-09-08 DIAGNOSIS — E876 Hypokalemia: Secondary | ICD-10-CM | POA: Diagnosis present

## 2015-09-08 DIAGNOSIS — B951 Streptococcus, group B, as the cause of diseases classified elsewhere: Secondary | ICD-10-CM | POA: Diagnosis present

## 2015-09-08 DIAGNOSIS — Z992 Dependence on renal dialysis: Secondary | ICD-10-CM

## 2015-09-08 DIAGNOSIS — E875 Hyperkalemia: Secondary | ICD-10-CM | POA: Diagnosis present

## 2015-09-08 DIAGNOSIS — M726 Necrotizing fasciitis: Secondary | ICD-10-CM | POA: Diagnosis present

## 2015-09-08 DIAGNOSIS — N390 Urinary tract infection, site not specified: Secondary | ICD-10-CM | POA: Diagnosis not present

## 2015-09-08 DIAGNOSIS — D509 Iron deficiency anemia, unspecified: Secondary | ICD-10-CM | POA: Diagnosis not present

## 2015-09-08 DIAGNOSIS — Z8679 Personal history of other diseases of the circulatory system: Secondary | ICD-10-CM | POA: Diagnosis not present

## 2015-09-08 DIAGNOSIS — D649 Anemia, unspecified: Secondary | ICD-10-CM | POA: Diagnosis present

## 2015-09-08 NOTE — Telephone Encounter (Signed)
Request from Jones SkeneJohn Guha at [facility]  60 yo F with hx of IDDM and HTN and recent hx of AKI and Brandt Loosennec fasc ([facility]) presents to [facility] with abnormal labs from PCP, new AKI.    124/59 113 19 95% RA afebrile.  Na 132 BUN 104, Cr 18, K 6.5.  WBC 14K Hgb 9.8  Got calcium, Insulin, D50, kayexalate 30.  To telemetry.     During last hospitalization, creatinine peaked at 1.93 and was 0.84 at discharge.

## 2015-09-09 ENCOUNTER — Inpatient Hospital Stay (HOSPITAL_COMMUNITY): Payer: BLUE CROSS/BLUE SHIELD

## 2015-09-09 ENCOUNTER — Encounter (HOSPITAL_COMMUNITY): Payer: Self-pay

## 2015-09-09 DIAGNOSIS — N170 Acute kidney failure with tubular necrosis: Principal | ICD-10-CM

## 2015-09-09 DIAGNOSIS — E871 Hypo-osmolality and hyponatremia: Secondary | ICD-10-CM | POA: Diagnosis present

## 2015-09-09 DIAGNOSIS — L26 Exfoliative dermatitis: Secondary | ICD-10-CM | POA: Diagnosis present

## 2015-09-09 DIAGNOSIS — D649 Anemia, unspecified: Secondary | ICD-10-CM | POA: Diagnosis present

## 2015-09-09 DIAGNOSIS — N179 Acute kidney failure, unspecified: Secondary | ICD-10-CM | POA: Diagnosis present

## 2015-09-09 DIAGNOSIS — E875 Hyperkalemia: Secondary | ICD-10-CM | POA: Diagnosis present

## 2015-09-09 LAB — URINALYSIS, ROUTINE W REFLEX MICROSCOPIC
GLUCOSE, UA: NEGATIVE mg/dL
KETONES UR: 15 mg/dL — AB
Nitrite: NEGATIVE
PH: 5 (ref 5.0–8.0)
Protein, ur: 30 mg/dL — AB
Specific Gravity, Urine: 1.021 (ref 1.005–1.030)

## 2015-09-09 LAB — BASIC METABOLIC PANEL
Anion gap: 21 — ABNORMAL HIGH (ref 5–15)
BUN: 114 mg/dL — AB (ref 6–20)
CALCIUM: 8.9 mg/dL (ref 8.9–10.3)
CHLORIDE: 98 mmol/L — AB (ref 101–111)
CO2: 20 mmol/L — ABNORMAL LOW (ref 22–32)
CREATININE: 18.66 mg/dL — AB (ref 0.44–1.00)
GFR calc Af Amer: 2 mL/min — ABNORMAL LOW (ref >60)
GFR, EST NON AFRICAN AMERICAN: 2 mL/min — AB (ref >60)
Glucose, Bld: 107 mg/dL — ABNORMAL HIGH (ref 65–99)
Potassium: 4.7 mmol/L (ref 3.5–5.1)
SODIUM: 139 mmol/L (ref 135–145)

## 2015-09-09 LAB — CBC WITH DIFFERENTIAL/PLATELET
BASOS PCT: 0 %
Basophils Absolute: 0 10*3/uL (ref 0.0–0.1)
EOS ABS: 0.3 10*3/uL (ref 0.0–0.7)
EOS PCT: 2 %
HCT: 27 % — ABNORMAL LOW (ref 36.0–46.0)
Hemoglobin: 8.8 g/dL — ABNORMAL LOW (ref 12.0–15.0)
LYMPHS ABS: 0.9 10*3/uL (ref 0.7–4.0)
LYMPHS PCT: 7 %
MCH: 30.1 pg (ref 26.0–34.0)
MCHC: 32.6 g/dL (ref 30.0–36.0)
MCV: 92.5 fL (ref 78.0–100.0)
Monocytes Absolute: 0.5 10*3/uL (ref 0.1–1.0)
Monocytes Relative: 3 %
NEUTROS ABS: 12.2 10*3/uL — AB (ref 1.7–7.7)
Neutrophils Relative %: 88 %
Platelets: 274 10*3/uL (ref 150–400)
RBC: 2.92 MIL/uL — ABNORMAL LOW (ref 3.87–5.11)
RDW: 15.1 % (ref 11.5–15.5)
WBC: 14 10*3/uL — AB (ref 4.0–10.5)

## 2015-09-09 LAB — DIFFERENTIAL
Basophils Absolute: 0 10*3/uL (ref 0.0–0.1)
Basophils Relative: 0 %
Eosinophils Absolute: 0.8 10*3/uL — ABNORMAL HIGH (ref 0.0–0.7)
Eosinophils Relative: 5 %
LYMPHS PCT: 5 %
Lymphs Abs: 0.8 10*3/uL (ref 0.7–4.0)
MONO ABS: 0.6 10*3/uL (ref 0.1–1.0)
Monocytes Relative: 4 %
NEUTROS ABS: 13.2 10*3/uL — AB (ref 1.7–7.7)
Neutrophils Relative %: 86 %

## 2015-09-09 LAB — COMPREHENSIVE METABOLIC PANEL
ALBUMIN: 2.1 g/dL — AB (ref 3.5–5.0)
ALK PHOS: 127 U/L — AB (ref 38–126)
ALT: 17 U/L (ref 14–54)
ANION GAP: 20 — AB (ref 5–15)
AST: 12 U/L — ABNORMAL LOW (ref 15–41)
BUN: 114 mg/dL — ABNORMAL HIGH (ref 6–20)
CALCIUM: 9.4 mg/dL (ref 8.9–10.3)
CHLORIDE: 98 mmol/L — AB (ref 101–111)
CO2: 20 mmol/L — AB (ref 22–32)
Creatinine, Ser: 18.71 mg/dL — ABNORMAL HIGH (ref 0.44–1.00)
GFR calc Af Amer: 2 mL/min — ABNORMAL LOW (ref >60)
GFR calc non Af Amer: 2 mL/min — ABNORMAL LOW (ref >60)
GLUCOSE: 221 mg/dL — AB (ref 65–99)
Potassium: 5.2 mmol/L — ABNORMAL HIGH (ref 3.5–5.1)
SODIUM: 138 mmol/L (ref 135–145)
Total Bilirubin: 0.7 mg/dL (ref 0.3–1.2)
Total Protein: 7 g/dL (ref 6.5–8.1)

## 2015-09-09 LAB — GLUCOSE, CAPILLARY
GLUCOSE-CAPILLARY: 188 mg/dL — AB (ref 65–99)
GLUCOSE-CAPILLARY: 147 mg/dL — AB (ref 65–99)
Glucose-Capillary: 115 mg/dL — ABNORMAL HIGH (ref 65–99)

## 2015-09-09 LAB — PROTIME-INR
INR: 1.26 (ref 0.00–1.49)
PROTHROMBIN TIME: 15.9 s — AB (ref 11.6–15.2)

## 2015-09-09 LAB — TROPONIN I
TROPONIN I: 0.03 ng/mL (ref <0.031)
TROPONIN I: 0.03 ng/mL (ref <0.031)
TROPONIN I: 0.06 ng/mL — AB (ref <0.031)

## 2015-09-09 LAB — IRON AND TIBC
Iron: 41 ug/dL (ref 28–170)
Saturation Ratios: 19 % (ref 10.4–31.8)
TIBC: 214 ug/dL — ABNORMAL LOW (ref 250–450)
UIBC: 173 ug/dL

## 2015-09-09 LAB — NA AND K (SODIUM & POTASSIUM), RAND UR
Potassium Urine: 43 mmol/L
Sodium, Ur: 18 mmol/L

## 2015-09-09 LAB — URINE MICROSCOPIC-ADD ON

## 2015-09-09 LAB — PHOSPHORUS: PHOSPHORUS: 17.1 mg/dL — AB (ref 2.5–4.6)

## 2015-09-09 LAB — MAGNESIUM
MAGNESIUM: 3.6 mg/dL — AB (ref 1.7–2.4)
Magnesium: 3.5 mg/dL — ABNORMAL HIGH (ref 1.7–2.4)

## 2015-09-09 LAB — MRSA PCR SCREENING: MRSA by PCR: NEGATIVE

## 2015-09-09 LAB — SEDIMENTATION RATE: SED RATE: 137 mm/h — AB (ref 0–22)

## 2015-09-09 LAB — APTT: aPTT: 27 seconds (ref 24–37)

## 2015-09-09 LAB — CK: Total CK: 142 U/L (ref 38–234)

## 2015-09-09 LAB — CREATININE, URINE, RANDOM: Creatinine, Urine: >400 mg/dL

## 2015-09-09 MED ORDER — ACETAMINOPHEN 500 MG PO TABS
500.0000 mg | ORAL_TABLET | Freq: Four times a day (QID) | ORAL | Status: DC | PRN
  Administered 2015-09-12 – 2015-09-15 (×2): 500 mg via ORAL
  Filled 2015-09-09 (×2): qty 1

## 2015-09-09 MED ORDER — SODIUM CHLORIDE 0.9 % IV SOLN: 100.0000 mL | INTRAVENOUS | Status: DC | PRN

## 2015-09-09 MED ORDER — HEPARIN 1000 UNIT/ML FOR PERITONEAL DIALYSIS: 3.0000 mL | INTRAMUSCULAR | Status: DC

## 2015-09-09 MED ORDER — LIDOCAINE HCL (PF) 1 % IJ SOLN: 5.0000 mL | INTRAMUSCULAR | Status: DC | PRN

## 2015-09-09 MED ORDER — LIDOCAINE-PRILOCAINE 2.5-2.5 % EX CREA
1.0000 "application " | TOPICAL_CREAM | CUTANEOUS | Status: DC | PRN
  Filled 2015-09-09: qty 5

## 2015-09-09 MED ORDER — SODIUM CHLORIDE 0.9% FLUSH: 3.0000 mL | INTRAVENOUS | Status: DC | PRN

## 2015-09-09 MED ORDER — HEPARIN SODIUM (PORCINE) 1000 UNIT/ML DIALYSIS
1000.0000 [IU] | INTRAMUSCULAR | Status: DC | PRN
Start: 2015-09-09 — End: 2015-09-10
  Filled 2015-09-09: qty 1

## 2015-09-09 MED ORDER — OXYCODONE HCL 5 MG PO TABS
5.0000 mg | ORAL_TABLET | ORAL | Status: DC | PRN
Start: 2015-09-09 — End: 2015-09-21
  Administered 2015-09-10 – 2015-09-13 (×2): 10 mg via ORAL
  Administered 2015-09-15 – 2015-09-16 (×2): 5 mg via ORAL
  Administered 2015-09-17 – 2015-09-18 (×2): 10 mg via ORAL
  Filled 2015-09-09 (×2): qty 2
  Filled 2015-09-09 (×2): qty 1
  Filled 2015-09-09 (×2): qty 2

## 2015-09-09 MED ORDER — ALTEPLASE 2 MG IJ SOLR: 2.0000 mg | Freq: Once | INTRAMUSCULAR | Status: DC | PRN

## 2015-09-09 MED ORDER — LIDOCAINE-PRILOCAINE 2.5-2.5 % EX CREA: 1.0000 "application " | TOPICAL_CREAM | CUTANEOUS | Status: DC | PRN

## 2015-09-09 MED ORDER — INSULIN ASPART 100 UNIT/ML ~~LOC~~ SOLN
0.0000 [IU] | Freq: Three times a day (TID) | SUBCUTANEOUS | Status: DC
  Administered 2015-09-09: 2 [IU] via SUBCUTANEOUS
  Administered 2015-09-09: 3 [IU] via SUBCUTANEOUS
  Administered 2015-09-10 – 2015-09-11 (×3): 2 [IU] via SUBCUTANEOUS
  Administered 2015-09-11 (×2): 5 [IU] via SUBCUTANEOUS
  Administered 2015-09-13: 8 [IU] via SUBCUTANEOUS
  Administered 2015-09-14: 2 [IU] via SUBCUTANEOUS
  Administered 2015-09-15: 3 [IU] via SUBCUTANEOUS
  Administered 2015-09-15: 5 [IU] via SUBCUTANEOUS
  Administered 2015-09-15 – 2015-09-16 (×2): 3 [IU] via SUBCUTANEOUS
  Administered 2015-09-18: 5 [IU] via SUBCUTANEOUS
  Administered 2015-09-19: 3 [IU] via SUBCUTANEOUS
  Administered 2015-09-20: 2 [IU] via SUBCUTANEOUS

## 2015-09-09 MED ORDER — SODIUM CHLORIDE 0.9% FLUSH
3.0000 mL | Freq: Two times a day (BID) | INTRAVENOUS | Status: DC
  Administered 2015-09-09 – 2015-09-14 (×5): 3 mL via INTRAVENOUS

## 2015-09-09 MED ORDER — PRAVASTATIN SODIUM 40 MG PO TABS: 40.0000 mg | ORAL_TABLET | Freq: Every day | ORAL | Status: DC

## 2015-09-09 MED ORDER — ONDANSETRON HCL 4 MG/2ML IJ SOLN
4.0000 mg | Freq: Four times a day (QID) | INTRAMUSCULAR | Status: DC | PRN
  Administered 2015-09-09 – 2015-09-19 (×3): 4 mg via INTRAVENOUS
  Filled 2015-09-09 (×5): qty 2

## 2015-09-09 MED ORDER — INSULIN GLARGINE 100 UNIT/ML ~~LOC~~ SOLN
10.0000 [IU] | Freq: Every day | SUBCUTANEOUS | Status: DC
  Administered 2015-09-09 – 2015-09-12 (×3): 10 [IU] via SUBCUTANEOUS
  Filled 2015-09-09 (×5): qty 0.1

## 2015-09-09 MED ORDER — SODIUM CHLORIDE 0.9 % IV SOLN
100.0000 mL | INTRAVENOUS | Status: DC | PRN
Start: 2015-09-09 — End: 2015-09-10

## 2015-09-09 MED ORDER — POLYETHYLENE GLYCOL 3350 17 G PO PACK: 17.0000 g | PACK | Freq: Every day | ORAL | Status: DC | PRN

## 2015-09-09 MED ORDER — SODIUM CHLORIDE 0.9 % IV SOLN: 250.0000 mL | INTRAVENOUS | Status: DC | PRN

## 2015-09-09 MED ORDER — SACCHAROMYCES BOULARDII 250 MG PO CAPS
250.0000 mg | ORAL_CAPSULE | Freq: Two times a day (BID) | ORAL | Status: DC
  Administered 2015-09-09 – 2015-09-21 (×24): 250 mg via ORAL
  Filled 2015-09-09 (×23): qty 1

## 2015-09-09 MED ORDER — SODIUM CHLORIDE 0.9% FLUSH
3.0000 mL | Freq: Two times a day (BID) | INTRAVENOUS | Status: DC
  Administered 2015-09-09 – 2015-09-15 (×7): 3 mL via INTRAVENOUS

## 2015-09-09 MED ORDER — ASPIRIN EC 81 MG PO TBEC
81.0000 mg | DELAYED_RELEASE_TABLET | Freq: Every day | ORAL | Status: DC
  Administered 2015-09-09 – 2015-09-21 (×12): 81 mg via ORAL
  Filled 2015-09-09 (×12): qty 1

## 2015-09-09 MED ORDER — HEPARIN SODIUM (PORCINE) 5000 UNIT/ML IJ SOLN
5000.0000 [IU] | Freq: Three times a day (TID) | INTRAMUSCULAR | Status: DC
  Administered 2015-09-09 – 2015-09-20 (×33): 5000 [IU] via SUBCUTANEOUS
  Filled 2015-09-09 (×32): qty 1

## 2015-09-09 MED ORDER — ONDANSETRON HCL 4 MG PO TABS
4.0000 mg | ORAL_TABLET | Freq: Four times a day (QID) | ORAL | Status: DC | PRN
Start: 2015-09-09 — End: 2015-09-18
  Administered 2015-09-15 – 2015-09-17 (×3): 4 mg via ORAL
  Filled 2015-09-09 (×3): qty 1

## 2015-09-09 MED ORDER — HEPARIN SODIUM (PORCINE) 1000 UNIT/ML IJ SOLN
3000.0000 [IU] | Freq: Once | INTRAMUSCULAR | Status: DC
  Filled 2015-09-09: qty 3
  Filled 2015-09-09: qty 1

## 2015-09-09 MED ORDER — SODIUM CHLORIDE 0.9 % IV SOLN
INTRAVENOUS | Status: DC
  Administered 2015-09-09 – 2015-09-14 (×9): via INTRAVENOUS

## 2015-09-09 MED ORDER — HEPARIN SODIUM (PORCINE) 1000 UNIT/ML DIALYSIS
1000.0000 [IU] | INTRAMUSCULAR | Status: DC | PRN
  Filled 2015-09-09: qty 1

## 2015-09-09 MED ORDER — PENTAFLUOROPROP-TETRAFLUOROETH EX AERO: 1.0000 "application " | INHALATION_SPRAY | CUTANEOUS | Status: DC | PRN

## 2015-09-09 MED ORDER — ALBUTEROL SULFATE (2.5 MG/3ML) 0.083% IN NEBU: 3.0000 mL | INHALATION_SOLUTION | Freq: Four times a day (QID) | RESPIRATORY_TRACT | Status: DC | PRN

## 2015-09-09 MED ORDER — HEPARIN SODIUM (PORCINE) 1000 UNIT/ML DIALYSIS
40.0000 [IU]/kg | Freq: Once | INTRAMUSCULAR | Status: DC
  Filled 2015-09-09: qty 4

## 2015-09-09 MED ORDER — HEPARIN SODIUM (PORCINE) 1000 UNIT/ML DIALYSIS
100.0000 [IU]/kg | INTRAMUSCULAR | Status: DC | PRN
Start: 2015-09-09 — End: 2015-09-10
  Filled 2015-09-09: qty 10

## 2015-09-09 MED ORDER — LANTHANUM CARBONATE 500 MG PO CHEW
1000.0000 mg | CHEWABLE_TABLET | Freq: Three times a day (TID) | ORAL | Status: DC
  Administered 2015-09-09: 1000 mg via ORAL
  Filled 2015-09-09 (×2): qty 2

## 2015-09-09 MED ORDER — FLUOXETINE HCL 20 MG PO CAPS
20.0000 mg | ORAL_CAPSULE | Freq: Two times a day (BID) | ORAL | Status: DC
  Administered 2015-09-09 – 2015-09-21 (×24): 20 mg via ORAL
  Filled 2015-09-09 (×24): qty 1

## 2015-09-09 NOTE — Progress Notes (Signed)
Progress Note   [name] BJY:[id number] DOB: 06/08/1956 DOA: 09/08/2015 PCP: Louie BostonAPPER,[name], MD   Brief Narrative:   [name] is an 60 y.o. female with a PMH of hypertension and insulin-dependent diabetes was transferred from [facility] for evaluation of acute renal failure and associated hyperkalemia. Patient was recently hospitalized 08/07/15-08/15/15 for treatment of sepsis secondary to necrotizing fasciitis. She was discharged home on clindamycin. A follow-up check of her creatinine on 09/05/15 revealed marked elevation of 15.7 (1.16 at discharge).  Assessment/Plan:   Principal Problem:   Acute renal failure (ARF) (HCC) Admission creatinine 18.71. Creatinine last noted to be 1.16 at her prior hospital discharge. Urinalysis pending. Renal ultrasound negative for hydronephrosis. Will ask nephrology to evaluate. Spoke with Dr. Detterding who was see the patient in consultation. Place Foley for strict I/O and to obtain urine specimen for evaluation.  Active Problems:   Necrotizing fasciitis (HCC) of inner thigh Patient has a large open wound on her medial right thigh after debridement last month. She has been receiving wound care through home health services. We'll place wound care consultation for further inpatient treatment recommendations. She completed a course of clindamycin. One set of blood cultures was drawn on admission.    IDDM (insulin dependent diabetes mellitus) (HCC) Home medications on hold including [place]Farxiga. Hemoglobin A1c 10% on 09/05/15 indicating poor outpatient glycemic control. Currently being managed with 10 units of Lantus in moderate scale SSI 3 times a day. CBG 188 this morning.    History of hypertension Metoprolol on hold. Blood pressure slightly low this morning. Continue to hold metoprolol.    Hyperkalemia Status post treatment with calcium, dextrose, insulin, and Kayexalate. Repeat labs.    Generalized exfoliative  dermatitis This appears to be new since her recent hospital discharge, and is concerning for a possible adverse reaction.    Normocytic anemia Consistent with usual baseline values. Likely anemia of chronic disease.    DVT Prophylaxis Subcutaneous heparin ordered.   Family Communication/Anticipated D/C date and plan/Code Status   Family Communication: No family currently at the bedside. Disposition Plan/date: Home when renal function stable. Code Status: Full code.   Procedures and diagnostic studies:   [place] Renal  09/09/2015  CLINICAL DATA:  Acute onset of renal insufficiency. Initial encounter. EXAM: RENAL / URINARY TRACT ULTRASOUND COMPLETE COMPARISON:  CT of the pelvis performed 08/07/2015 FINDINGS: Right Kidney: Length: 11.3 cm. Echogenicity within normal limits. No mass or hydronephrosis visualized. Left Kidney: Length: 11.3 cm. Echogenicity within normal limits. No mass or hydronephrosis visualized. Bladder: Appears normal for degree of bladder distention. IMPRESSION: Unremarkable renal ultrasound. Electronically Signed   By: Roanna RaiderJeffery  Chang M.D.   On: 09/09/2015 05:38    Medical Consultants:    Nephrology: Dr. Otho Bellowsetterding  Anti-Infectives:   Anti-infectives    None      Subjective:    [name] denies nausea, vomiting, or significant pain. She appears mildly sleepy, but answers questions appropriately. Appetite has been poor.  Objective:    Filed Vitals:   09/08/15 2354 09/09/15 0545  BP: 126/61 105/47  Pulse: 111 104  Temp: 97.8 F (36.6 C) 98.4 F (36.9 C)  TempSrc: Oral Oral  Resp: 20 18  Height: 5\' 2"  (1.575 m)   Weight: 94.4 kg (208 lb 1.8 oz) 94.4 kg (208 lb 1.8 oz)  SpO2: 100% 100%    Intake/Output Summary (Last 24 hours) at 09/09/15 0903 Last data filed at 09/09/15 0600  Gross per 24 hour  Intake      0 ml  Output      3 ml  Net     -3 ml   Filed Weights   09/08/15 2354 09/09/15 0545  Weight: 94.4 kg (208 lb 1.8 oz) 94.4 kg (208  lb 1.8 oz)    Exam: Gen:  Mildly sleepy but answers questions appropriately Cardiovascular:  Tachycardic, No M/R/G Respiratory:  Diminished but clear Gastrointestinal:  Abdomen soft, NT/ND, + BS Extremities:  Right thigh dressing intact   Data Reviewed:    Labs: Basic Metabolic Panel:  Recent Labs Lab 09/09/15 0338  NA 138  K 5.2*  CL 98*  CO2 20*  GLUCOSE 221*  BUN 114*  CREATININE 18.71*  CALCIUM 9.4  MG 3.5*  PHOS 17.1*   GFR Estimated Creatinine Clearance: 3.4 mL/min (by C-G formula based on Cr of 18.71). Liver Function Tests:  Recent Labs Lab 09/09/15 0338  AST 12*  ALT 17  ALKPHOS 127*  BILITOT 0.7  PROT 7.0  ALBUMIN 2.1*   Coagulation profile  Recent Labs Lab 09/09/15 0338  INR 1.26    CBC:  Recent Labs Lab 09/09/15 0338  WBC 14.0*  NEUTROABS 12.2*  HGB 8.8*  HCT 27.0*  MCV 92.5  PLT 274   Cardiac Enzymes:  Recent Labs Lab 09/09/15 0338  TROPONINI 0.03   CBG:  Recent Labs Lab 09/09/15 0637  GLUCAP 188*   Microbiology Recent Results (from the past 240 hour(s))  MRSA PCR Screening     Status: None   Collection Time: 09/09/15  2:29 AM  Result Value Ref Range Status   MRSA by PCR NEGATIVE NEGATIVE Final    Comment:        The GeneXpert MRSA Assay (FDA approved for NASAL specimens only), is one component of a comprehensive MRSA colonization surveillance program. It is not intended to diagnose MRSA infection nor to guide or monitor treatment for MRSA infections.   Culture, blood (routine x 2)     Status: None (Preliminary result)   Collection Time: 09/09/15  3:50 AM  Result Value Ref Range Status   Specimen Description BLOOD LEFT HAND  Final   Special Requests BOTTLES DRAWN AEROBIC ONLY  Final   Culture PENDING  Incomplete   Report Status PENDING  Incomplete     Medications:   . aspirin EC  81 mg Oral Daily  . FLUoxetine  20 mg Oral BID  . heparin  5,000 Units Subcutaneous 3 times per day  . insulin  aspart  0-15 Units Subcutaneous TID WC  . insulin glargine  10 Units Subcutaneous QHS  . pravastatin  40 mg Oral q1800  . saccharomyces boulardii  250 mg Oral BID  . sodium chloride flush  3 mL Intravenous Q12H  . sodium chloride flush  3 mL Intravenous Q12H   Continuous Infusions:   Time spent: 35 minutes.  The patient is medically complex with multiple co-morbidities and is at high risk for clinical deterioration and requires high complexity decision making.    LOS: 1 day   RAMA,CHRISTINA  [facility] Pager [phone]. If unable to reach me by pager, please call my cell phone at [phone].  *Please refer to [email or web address], password [email or web address] to get updated schedule on who will round on this patient, as hospitalists switch teams weekly. If 7PM-7AM, please contact night-coverage at [email or web address], password [email or web address] for any overnight needs.  09/09/2015, 9:03 AM

## 2015-09-09 NOTE — Procedures (Signed)
I was present at this session.  I have reviewed the session itself and made appropriate changes.  BP low 100s.  R IJ cath.  lethargic  Bristol Soy L 4/1/20174:10 PM

## 2015-09-09 NOTE — Consult Note (Signed)
Reason for Consult:AKI Referring Physician: Dr. Conard Novak is an 60 y.o. female.  HPI: 60 yr female with long term DM and HTN (she cannot remember how long) admitted with AKI.  Here about 1 mon ago with necrotizing fasciitis R thigh.  Cr . -1.9.  Tx at home with cleocin.  At home over past 5-7 d, very poor appetite, intake, itching, cramping, tired ,sleepy and noted twitching.  In ED last pm Cr 18, k 5s, low bicarb.  On Lisinopril/HCT, Farxiga, .  Hx UTIs in past, but no stones.  Father on dialysis not known what from.  Rash both groins. Constitutional: as above, feels tired , weak Eyes: glasses Ears, nose, mouth, throat, and face: dry mouth, bad taste Respiratory: negative Cardiovascular: no edema Gastrointestinal: no appetite Genitourinary:less urine Integument/breast: as above Musculoskeletal:negative Neurological: very sleepy, twitching Endocrine: DM Allergic/Immunologic: PCN   Past Medical History  Diagnosis Date  . Diabetes mellitus without complication ([place])   . Hypertension     Past Surgical History  Procedure Laterality Date  . Incision and drainage abscess Right 08/08/2015    Procedure: Irrigation and Debridement of Right Lower Ext.;  Surgeon: Ralene Ok, MD;  Location: [place] [facility];  Service: General;  Laterality: Right;  . Incision and drainage Right 08/09/2015    Procedure: INCISION AND DRAINAGE;  Surgeon: Mickeal Skinner, MD;  Location: [place];  Service: General;  Laterality: Right;    History reviewed. No pertinent family history.  Social History:  reports that she has been smoking Cigarettes.  She does not have any smokeless tobacco history on file. She reports that she drinks alcohol. She reports that she does not use illicit drugs.  Allergies:  Allergies  Allergen Reactions  . Penicillins Rash    Has patient had a PCN reaction causing immediate rash, facial/tongue/throat swelling, SOB or lightheadedness with hypotension: Yes Has patient had  a PCN reaction causing severe rash involving mucus membranes or skin necrosis: No Has patient had a PCN reaction that required hospitalization No Has patient had a PCN reaction occurring within the last 10 years: No If all of the above answers are "NO", then may proceed with Cephalosporin use.     Medications:  I have reviewed the patient's current medications. Prior to Admission:  Prescriptions prior to admission  Medication Sig Dispense Refill Last Dose  . acetaminophen (TYLENOL) 500 MG tablet Take 500 mg by mouth every 6 (six) hours as needed for mild pain or moderate pain.   unknown  . aspirin EC 81 MG tablet Take 81 mg by mouth daily.   08/18/2015 at Unknown time  . clindamycin (CLEOCIN) 150 MG capsule Take 3 capsules (450 mg total) by mouth every 8 (eight) hours. Stop date 08/23/2015   08/18/2015 at Unknown time  . FARXIGA 10 MG TABS tablet Take 10 mg by mouth daily.  1 08/18/2015 at Unknown time  . FLUoxetine (PROZAC) 20 MG capsule Take 20 mg by mouth 2 (two) times daily.   1 08/18/2015 at Unknown time  . insulin aspart (NOVOLOG) 100 UNIT/ML injection Before each meal 3 times a day, 140-199 - 2 units, 200-250 - 4 units, 251-299 - 6 units,  300-349 - 8 units,  350 or above 10 units. 10 mL 11 08/18/2015 at Unknown time  . insulin glargine (LANTUS) 100 UNIT/ML injection Inject 0.18 mLs (18 Units total) into the skin 2 (two) times daily. 10 mL 11 08/18/2015 at Unknown time  . lisinopril-hydrochlorothiazide (PRINZIDE,ZESTORETIC) 20-25 MG tablet  Take 1 tablet by mouth daily.  3 08/18/2015 at Unknown time  . metoprolol (LOPRESSOR) 50 MG tablet Take 50 mg by mouth 2 (two) times daily.  1 08/18/2015 at 8-900a  . NIFEdipine (PROCARDIA-XL/ADALAT CC) 30 MG 24 hr tablet Take 30 mg by mouth daily.  3 08/18/2015 at Unknown time  . oxyCODONE (OXY IR/ROXICODONE) 5 MG immediate release tablet Take 1-2 tablets (5-10 mg total) by mouth every 4 (four) hours as needed for moderate pain. 30 tablet 0 Past Week at  Unknown time  . pravastatin (PRAVACHOL) 40 MG tablet Take 40 mg by mouth daily.  2 08/18/2015 at Unknown time  . saccharomyces boulardii (FLORASTOR) 250 MG capsule Take 1 capsule (250 mg total) by mouth 2 (two) times daily.   08/18/2015 at Unknown time    Results for orders placed or performed during the hospital encounter of 09/08/15 (from the past 48 hour(s))  MRSA PCR Screening     Status: None   Collection Time: 09/09/15  2:29 AM  Result Value Ref Range   MRSA by PCR NEGATIVE NEGATIVE    Comment:        The GeneXpert MRSA Assay (FDA approved for NASAL specimens only), is one component of a comprehensive MRSA colonization surveillance program. It is not intended to diagnose MRSA infection nor to guide or monitor treatment for MRSA infections.   Comprehensive metabolic panel     Status: Abnormal   Collection Time: 09/09/15  3:38 AM  Result Value Ref Range   Sodium 138 135 - 145 mmol/L   Potassium 5.2 (H) 3.5 - 5.1 mmol/L   Chloride 98 (L) 101 - 111 mmol/L   CO2 20 (L) 22 - 32 mmol/L   Glucose, Bld 221 (H) 65 - 99 mg/dL   BUN 114 (H) 6 - 20 mg/dL   Creatinine, Ser 18.71 (H) 0.44 - 1.00 mg/dL   Calcium 9.4 8.9 - 10.3 mg/dL   Total Protein 7.0 6.5 - 8.1 g/dL   Albumin 2.1 (L) 3.5 - 5.0 g/dL   AST 12 (L) 15 - 41 U/L   ALT 17 14 - 54 U/L   Alkaline Phosphatase 127 (H) 38 - 126 U/L   Total Bilirubin 0.7 0.3 - 1.2 mg/dL   GFR calc non Af Amer 2 (L) >60 mL/min   GFR calc Af Amer 2 (L) >60 mL/min    Comment: (NOTE) The eGFR has been calculated using the CKD EPI equation. This calculation has not been validated in all clinical situations. eGFR's persistently <60 mL/min signify possible Chronic Kidney Disease.    Anion gap 20 (H) 5 - 15  Magnesium     Status: Abnormal   Collection Time: 09/09/15  3:38 AM  Result Value Ref Range   Magnesium 3.5 (H) 1.7 - 2.4 mg/dL  Phosphorus     Status: Abnormal   Collection Time: 09/09/15  3:38 AM  Result Value Ref Range   Phosphorus 17.1  (H) 2.5 - 4.6 mg/dL    Comment: RESULTS CONFIRMED BY MANUAL DILUTION  CBC WITH DIFFERENTIAL     Status: Abnormal   Collection Time: 09/09/15  3:38 AM  Result Value Ref Range   WBC 14.0 (H) 4.0 - 10.5 K/uL   RBC 2.92 (L) 3.87 - 5.11 MIL/uL   Hemoglobin 8.8 (L) 12.0 - 15.0 g/dL   HCT 27.0 (L) 36.0 - 46.0 %   MCV 92.5 78.0 - 100.0 fL   MCH 30.1 26.0 - 34.0 pg   MCHC 32.6 30.0 -  36.0 g/dL   RDW 15.1 11.5 - 15.5 %   Platelets 274 150 - 400 K/uL   Neutrophils Relative % 88 %   Neutro Abs 12.2 (H) 1.7 - 7.7 K/uL   Lymphocytes Relative 7 %   Lymphs Abs 0.9 0.7 - 4.0 K/uL   Monocytes Relative 3 %   Monocytes Absolute 0.5 0.1 - 1.0 K/uL   Eosinophils Relative 2 %   Eosinophils Absolute 0.3 0.0 - 0.7 K/uL   Basophils Relative 0 %   Basophils Absolute 0.0 0.0 - 0.1 K/uL  Protime-INR     Status: Abnormal   Collection Time: 09/09/15  3:38 AM  Result Value Ref Range   Prothrombin Time 15.9 (H) 11.6 - 15.2 seconds   INR 1.26 0.00 - 1.49  APTT     Status: None   Collection Time: 09/09/15  3:38 AM  Result Value Ref Range   aPTT 27 24 - 37 seconds  Troponin I     Status: None   Collection Time: 09/09/15  3:38 AM  Result Value Ref Range   Troponin I 0.03 <0.031 ng/mL    Comment:        NO INDICATION OF MYOCARDIAL INJURY.   Sedimentation rate     Status: Abnormal   Collection Time: 09/09/15  3:38 AM  Result Value Ref Range   Sed Rate 137 (H) 0 - 22 mm/hr  Culture, blood (routine x 2)     Status: None (Preliminary result)   Collection Time: 09/09/15  3:50 AM  Result Value Ref Range   Specimen Description BLOOD LEFT HAND    Special Requests BOTTLES DRAWN AEROBIC ONLY 7ML    Culture PENDING    Report Status PENDING   Glucose, capillary     Status: Abnormal   Collection Time: 09/09/15  6:37 AM  Result Value Ref Range   Glucose-Capillary 188 (H) 65 - 99 mg/dL  Troponin I     Status: None   Collection Time: 09/09/15  9:58 AM  Result Value Ref Range   Troponin I 0.03 <0.031 ng/mL     Comment:        NO INDICATION OF MYOCARDIAL INJURY.   Basic metabolic panel     Status: Abnormal   Collection Time: 09/09/15  9:58 AM  Result Value Ref Range   Sodium 139 135 - 145 mmol/L   Potassium 4.7 3.5 - 5.1 mmol/L   Chloride 98 (L) 101 - 111 mmol/L   CO2 20 (L) 22 - 32 mmol/L   Glucose, Bld 107 (H) 65 - 99 mg/dL   BUN 114 (H) 6 - 20 mg/dL   Creatinine, Ser 18.66 (H) 0.44 - 1.00 mg/dL   Calcium 8.9 8.9 - 10.3 mg/dL   GFR calc non Af Amer 2 (L) >60 mL/min   GFR calc Af Amer 2 (L) >60 mL/min    Comment: (NOTE) The eGFR has been calculated using the CKD EPI equation. This calculation has not been validated in all clinical situations. eGFR's persistently <60 mL/min signify possible Chronic Kidney Disease.    Anion gap 21 (H) 5 - 15  Glucose, capillary     Status: Abnormal   Collection Time: 09/09/15 11:48 AM  Result Value Ref Range   Glucose-Capillary 147 (H) 65 - 99 mg/dL    US Renal  09/09/2015  CLINICAL DATA:  Acute onset of renal insufficiency. Initial encounter. EXAM: RENAL / URINARY TRACT ULTRASOUND COMPLETE COMPARISON:  CT of the pelvis performed 08/07/2015 FINDINGS: Right Kidney: Length:  11.3 cm. Echogenicity within normal limits. No mass or hydronephrosis visualized. Left Kidney: Length: 11.3 cm. Echogenicity within normal limits. No mass or hydronephrosis visualized. Bladder: Appears normal for degree of bladder distention. IMPRESSION: Unremarkable renal ultrasound. Electronically Signed   By: Garald Balding M.D.   On: 09/09/2015 05:38    ROS Blood pressure 106/45, pulse 73, temperature 97.6 F (36.4 C), temperature source Oral, resp. rate 18, height _0  (1.575 m), weight 94.4 kg (208 lb 1.8 oz), SpO2 100 %. Physical Exam Physical Examination: General appearance - lethargic , falls asleep, obese, scratching nonstop Mental status - lethargic , falls asleep talking, twitching,  Eyes - pupils equal and reactive, extraocular eye movements intact, funduscopic exam  normal, discs flat and sharp Mouth - dry Neck - adenopathy noted PCL Lymphatics - posterior cervical nodes Chest - decreased air entry noted bilat Heart - S1 and S2 normal, systolic murmur OE6/9 at apex Abdomen - obese, pos bs , soft, liver down 5 cm Neurological - asterixis, lethargic, falls asleep easily, mtr 4/5 CNintact Musculoskeletal - no joint tenderness, deformity or swelling Extremities - scaling rash both groins Skin - as above, severely dry, scaling, frost in axillas, dressing R thigh not taken down  Assessment/Plan: 1 AKI  Most likely hemodynamic/toxic with ACEi,HCT/farxiga in setting of poor intake.  Doubt AGN but poss.  Grossly uremic, acidemic, ^ k.  Needs HD.  Also dry, give ivf. 2 Fasciitis local care 3 Hypertension: not an issue 4. Anemia will be lower with ivf 5. DM control 6 obesiyt P ivf, foley. U/S, UA, urine chem, hold meds. HD, temp cath.  Mera Gunkel L 09/09/2015, 12:39 PM

## 2015-09-09 NOTE — Consult Note (Signed)
WOC wound consult note Reason for Consult: Right medial thigh wound, surgical.  Dr. Derrell Lollingamirez of [facility] performed debridement for necrotizing infection on 08/08/15.   Wound type:Surgical  Pressure Ulcer POA: No Measurement:11cm x 24cm x 0.6cm  Wound bed: red, moist. Drainage (amount, consistency, odor) small amoutn of serous exudate.  No odor. Periwound:Intact Dressing procedure/placement/frequency: Discharge orders from [facility] were for twice daily saline dressings.  I will provide Nursing with orders for continuation of that POC as wound is stable, clean and without evidence of infectious process at this time.  I am uncertain if patient was able to make follow up appointment with Dr. Lorelle Formosaamiriz in March. If you wish, notifying [facility] that their patient is in house may provide that follow up. If you agree, please consult/notify [facility]. WOC nursing team will not follow, but will remain available to this patient, the nursing and medical teams.  Please re-consult if needed. Thanks, Ladona MowLaurie Marin Milley, MSN, RN, GNP, Hans EdenCWOCN, CWON-AP, FAAN  Pager# [phone]

## 2015-09-09 NOTE — Procedures (Signed)
Central Venous dialysis Catheter Insertion Procedure Note Sophia EllisonGenevieve A [name] [id number] 05/27/1956  Procedure: Insertion of Central Venous Catheter Indications: dialysis   Procedure Details Consent: dialysis  Time Out: Verified patient identification, verified procedure, site/side was marked, verified correct patient position, special equipment/implants available, medications/allergies/relevent history reviewed, required imaging and test results available.  Performed US used to ID and cannulate  Maximum sterile technique was used including antiseptics, cap, gloves, gown, hand hygiene, mask and sheet. Skin prep: Chlorhexidine; local anesthetic administered A antimicrobial bonded/coated triple lumen catheter was placed in the right internal jugular vein using the Seldinger technique.  Evaluation Blood flow good Complications: No apparent complications Patient did tolerate procedure well. Chest X-ray ordered to verify placement.  CXR: pending.  Shelby Mattocksete E Babcock 09/09/2015, 2:10 PM  Simonne MartinetPeter E Babcock ACNP-BC [facility] Pulmonary/Critical Care Pager # [phone] OR # [phone] if no answer  Alyson ReedyWesam G. Cael Worth, M.D. [facility] Medicine. Pager: [phone]. After hours pager: [phone].

## 2015-09-09 NOTE — H&P (Signed)
**Note Sophia-Identified via Obfuscation** [facility] History and Physical  [name] ZOX:[id number] DOB: 21-Dec-1955 DOA: 09/08/2015  Referring physician: ED physician PCP: Sophia Boston, MD  Specialists: None listed   Chief Complaint:  Sent by PCP for acute renal failure  HPI: [name] is a 60 y.o. female with PMH of hypertension and insulin-dependent diabetes mellitus who presents in transfer from [name] with acute renal failure and associated hyperkalemia. Patient was recently admitted to this institution from 08/07/2015 - 08/15/2015 with sepsis secondary to a right medial thigh abscess complicated by necrotizing fasciitis. She developed acute kidney injury while in the [name], but renal function had improved by time of discharge. She was treated with clindamycin in the [name] and discharged to complete a course of the same. She has been at home, doing quite well per her report and wound care through home health services. She saw her primary care physician for [name] follow-up and outpatient labs were drawn on 09/05/2015. Blood work returned notable for serum creatinine of 15.7, up from 1.16 at time of her discharge, and BUN of 82. She was advised to present to the ED for further evaluation and did such at [name] on 09/08/2015.  In the ED at [name], patient was found to be afebrile, saturating adequately on room air, with sinus tachycardia in the 110s, and vital signs otherwise stable. She was found to have a serum creatinine of 18.2 with BUN of 104 and serum potassium of 6.5. CBC was notable for a leukocytosis to 14,800 and hemoglobin of 9.8. She was given temporizing measures for hyperkalemia with D50, IV calcium chloride, insulin, and Kayexalate. There were no nephrology consultants available over the weekend and so transfer to [name] was requested and accepted.  Patient is evaluated on the telemetry unit at [name] where she is found to be afebrile, tachycardic in the low 100s,  and with vital signs otherwise stable. She has no acute complaints at this time and will be admitted for ongoing evaluation and management of acute renal failure with hyperkalemia.  Where does patient live?   At home     Can patient participate in ADLs?  Yes        Review of Systems:   General: no fevers, chills, sweats, weight change, or poor appetite. Fatigue HEENT: no blurry vision, hearing changes or sore throat Pulm: no dyspnea, cough, or wheeze CV: no chest pain or palpitations Abd: no nausea, vomiting, abdominal pain, diarrhea, or constipation GU: no dysuria, hematuria, increased urinary frequency, or urgency. Decreased UOP  Ext: no leg edema Neuro: no focal weakness, numbness, or tingling, no vision change or hearing loss Skin: Peeling skin throughout, large wound at medial right thigh MSK: No muscle spasm, no deformity, no red, hot, or swollen joint Heme: No easy bruising or bleeding Travel history: No recent long distant travel    Allergy:  Allergies  Allergen Reactions  . Penicillins Rash    Has patient had a PCN reaction causing immediate rash, facial/tongue/throat swelling, SOB or lightheadedness with hypotension: Yes Has patient had a PCN reaction causing severe rash involving mucus membranes or skin necrosis: No Has patient had a PCN reaction that required hospitalization No Has patient had a PCN reaction occurring within the last 10 years: No If all of the above answers are "NO", then may proceed with Cephalosporin use.     Past Medical History  Diagnosis Date  . Diabetes mellitus without complication (HCC)   . Hypertension     Past Surgical History  Procedure Laterality Date  . Incision and drainage abscess Right 08/08/2015    Procedure: Irrigation and Debridement of Right Lower Ext.;  Surgeon: Sophia Filler, MD;  Location: [facility] OR;  Service: General;  Laterality: Right;  . Incision and drainage Right 08/09/2015    Procedure: INCISION AND DRAINAGE;  Surgeon:  Sophia Blanch Kinsinger, MD;  Location: [name];  Service: General;  Laterality: Right;    Social History:  reports that she has been smoking Cigarettes.  She does not have any smokeless tobacco history on file. She reports that she drinks alcohol. She reports that she does not use illicit drugs.  Family History: History reviewed. No pertinent family history.   Prior to Admission medications   Medication Sig Start Date End Date Taking? Authorizing Provider  acetaminophen (TYLENOL) 500 MG tablet Take 500 mg by mouth every 6 (six) hours as needed for mild pain or moderate pain.    Historical Provider, MD  aspirin EC 81 MG tablet Take 81 mg by mouth daily.    Historical Provider, MD  clindamycin (CLEOCIN) 150 MG capsule Take 3 capsules (450 mg total) by mouth every 8 (eight) hours. Stop date 08/23/2015 08/15/15   Sophia Sea, MD  FARXIGA 10 MG TABS tablet Take 10 mg by mouth daily. 07/23/15   Historical Provider, MD  FLUoxetine (PROZAC) 20 MG capsule Take 20 mg by mouth 2 (two) times daily.  05/11/15   Historical Provider, MD  insulin aspart (NOVOLOG) 100 UNIT/ML injection Before each meal 3 times a day, 140-199 - 2 units, 200-250 - 4 units, 251-299 - 6 units,  300-349 - 8 units,  350 or above 10 units. 08/15/15   Sophia Sea, MD  insulin glargine (LANTUS) 100 UNIT/ML injection Inject 0.18 mLs (18 Units total) into the skin 2 (two) times daily. 08/15/15   Sophia Sea, MD  lisinopril-hydrochlorothiazide (PRINZIDE,ZESTORETIC) 20-25 MG tablet Take 1 tablet by mouth daily. 05/11/15   Historical Provider, MD  metoprolol (LOPRESSOR) 50 MG tablet Take 50 mg by mouth 2 (two) times daily. 05/11/15   Historical Provider, MD  NIFEdipine (PROCARDIA-XL/ADALAT CC) 30 MG 24 hr tablet Take 30 mg by mouth daily. 05/11/15   Historical Provider, MD  oxyCODONE (OXY IR/ROXICODONE) 5 MG immediate release tablet Take 1-2 tablets (5-10 mg total) by mouth every 4 (four) hours as needed for moderate pain. 08/15/15   Sophia Sea, MD  pravastatin (PRAVACHOL) 40 MG tablet Take 40 mg by mouth daily. 05/11/15   Historical Provider, MD  saccharomyces boulardii (FLORASTOR) 250 MG capsule Take 1 capsule (250 mg total) by mouth 2 (two) times daily. 08/15/15   Sophia Sea, MD    Physical Exam: Filed Vitals:   09/08/15 2354  BP: 126/61  Pulse: 111  Temp: 97.8 F (36.6 C)  TempSrc: Oral  Resp: 20  Height:  (1.575 m)  Weight: 94.4 kg (208 lb 1.8 oz)  SpO2: 100%   General: Not in acute distress HEENT:       Eyes: PERRL, EOMI, no scleral icterus or conjunctival pallor.       ENT: No discharge from the ears or nose, no pharyngeal ulcers, oral mucosa moist.        Neck: No JVD, no bruit, no appreciable mass Heme: No cervical adenopathy, no pallor Cardiac: S1/S2, RRR, grade III holosystolic murmur throughout the precordium, No gallops or rubs. Pulm: Good air movement bilaterally. No rales, wheezing, rhonchi or rubs. Abd: Soft, nondistended, nontender, no rebound pain or  gaurding,  BS present. Ext: No LE edema bilaterally. 2+DP/PT pulse bilaterally. Musculoskeletal: No red, hot, swollen joints. Large defect in medial right thigh  Skin: Generalized exfoliative dermatitis, large medial right thigh wound with packing Neuro: Alert, oriented X3, cranial nerves II-XII grossly intact. No focal findings Psych: Patient is not overtly psychotic, appropriate mood and affect.  Labs on Admission:  Basic Metabolic Panel: No results for input(s): NA, K, CL, CO2, GLUCOSE, BUN, CREATININE, CALCIUM, MG, PHOS in the last 168 hours. Liver Function Tests: No results for input(s): AST, ALT, ALKPHOS, BILITOT, PROT, ALBUMIN in the last 168 hours. No results for input(s): LIPASE, AMYLASE in the last 168 hours. No results for input(s): AMMONIA in the last 168 hours. CBC: No results for input(s): WBC, NEUTROABS, HGB, HCT, MCV, PLT in the last 168 hours. Cardiac Enzymes: No results for input(s): CKTOTAL, CKMB, CKMBINDEX, TROPONINI  in the last 168 hours.  BNP (last 3 results) No results for input(s): BNP in the last 8760 hours.  ProBNP (last 3 results) No results for input(s): PROBNP in the last 8760 hours.  CBG: No results for input(s): GLUCAP in the last 168 hours.  Radiological Exams on Admission: No results found.  EKG: Independently reviewed.  Abnormal findings: Sinus tachycardia (rate 110), early R-wave transition   Assessment/Plan  1. Acute renal failure with hyperkalemia   - SCr 18.2 at outside [name] prior to transfer; was 1.16 at time of [name] discharge on 08/14/15  - Patient reports decreased UOP, but is otherwise asymptomatic  - Uncertain etiology; post-infectious GN considered; had contrast during recent admission, but SCr remained stable for >48 hrs after that, making CIN unlikely; drug effect possible   - Renal ultrasound ordered, looking for signs of obstruction, structural abnormality - Serum potassium was 6.5 at the outside [name] and was treated with calcium, dextrose, insulin, and Kayexalate  - Repeat BMP now and treat hyperkalemia as indicated  - No EKG changes appreciated    2. Generalized exfoliative dermatitis  - Uncertain etiology, pt reports new since recent [name] discharge  - Possibly secondary to medications - No evidence of secondary infection    3. Type II DM  - At home, she is treated with Lantus 18 units BID, Novolog sliding-scale, and Farxiga  - Hold the home medications  - Given renal failure, will start with low-dose basal and low-intensity SSI correctional, adjust prn  - CBG with meals and qHS  - A1c was 10% on 09/05/15, suggesting lack of control  - Carb-modified diet when appropriate    4. Hypertension  - BP running low here currently  - Hold home medications (lisinopril, HCTZ, Lopressor, Nifedipine)   5. Anemia - Hgb 9.8 on presentation to outside [name], stable relative to priors  - No sign of active blood loss   6. Wound  - Large open wound in  medial right thigh is status-post debridement for necrotizing fasciitis last month  - She has been receiving wound care through home health  - Wound appears to be healing, granulation tissue present, no foul odor or purulence  - Wound care consultation requested    DVT ppx: SQ Heparin    Code Status: Full code Family Communication: None at bed side.                Disposition Plan: Admit to inpatient   Date of Service 09/09/2015    Briscoe Deutscherimothy S Opyd, MD [facility] Pager [phone]  If 7PM-7AM, please contact night-coverage [email or web address] Password [email or web address] 09/09/2015, 3:05  AM    

## 2015-09-10 LAB — ANTISTREPTOLYSIN O TITER

## 2015-09-10 LAB — GLUCOSE, CAPILLARY
GLUCOSE-CAPILLARY: 132 mg/dL — AB (ref 65–99)
GLUCOSE-CAPILLARY: 108 mg/dL — AB (ref 65–99)
GLUCOSE-CAPILLARY: 111 mg/dL — AB (ref 65–99)
Glucose-Capillary: 136 mg/dL — ABNORMAL HIGH (ref 65–99)

## 2015-09-10 LAB — RENAL FUNCTION PANEL
ALBUMIN: 1.9 g/dL — AB (ref 3.5–5.0)
Anion gap: 16 — ABNORMAL HIGH (ref 5–15)
BUN: 61 mg/dL — AB (ref 6–20)
CHLORIDE: 102 mmol/L (ref 101–111)
CO2: 21 mmol/L — ABNORMAL LOW (ref 22–32)
CREATININE: 10.8 mg/dL — AB (ref 0.44–1.00)
Calcium: 7.7 mg/dL — ABNORMAL LOW (ref 8.9–10.3)
GFR, EST AFRICAN AMERICAN: 4 mL/min — AB (ref >60)
GFR, EST NON AFRICAN AMERICAN: 3 mL/min — AB (ref >60)
Glucose, Bld: 137 mg/dL — ABNORMAL HIGH (ref 65–99)
Phosphorus: 9.6 mg/dL — ABNORMAL HIGH (ref 2.5–4.6)
Potassium: 4.6 mmol/L (ref 3.5–5.1)
Sodium: 139 mmol/L (ref 135–145)

## 2015-09-10 LAB — HEPATITIS C ANTIBODY (REFLEX): HCV Ab: 0.3 s/co ratio (ref 0.0–0.9)

## 2015-09-10 LAB — C3 COMPLEMENT: C3 Complement: 161 mg/dL (ref 82–167)

## 2015-09-10 LAB — CBC
HCT: 27.6 % — ABNORMAL LOW (ref 36.0–46.0)
HEMOGLOBIN: 8.9 g/dL — AB (ref 12.0–15.0)
MCH: 30.3 pg (ref 26.0–34.0)
MCHC: 32.2 g/dL (ref 30.0–36.0)
MCV: 93.9 fL (ref 78.0–100.0)
PLATELETS: 253 10*3/uL (ref 150–400)
RBC: 2.94 MIL/uL — AB (ref 3.87–5.11)
RDW: 15.2 % (ref 11.5–15.5)
WBC: 16.3 10*3/uL — ABNORMAL HIGH (ref 4.0–10.5)

## 2015-09-10 LAB — HEPATITIS B SURFACE ANTIBODY,QUALITATIVE: HEP B S AB: NONREACTIVE

## 2015-09-10 LAB — C4 COMPLEMENT: Complement C4, Body Fluid: 56 mg/dL — ABNORMAL HIGH (ref 14–44)

## 2015-09-10 LAB — PARATHYROID HORMONE, INTACT (NO CA): PTH: 172 pg/mL — AB (ref 15–65)

## 2015-09-10 LAB — HEPATITIS B CORE ANTIBODY, IGM: HEP B C IGM: NEGATIVE

## 2015-09-10 LAB — HEPATITIS B SURFACE ANTIGEN: HEP B S AG: NEGATIVE

## 2015-09-10 LAB — UREA NITROGEN, URINE: Urea Nitrogen, Ur: 239 mg/dL

## 2015-09-10 LAB — HCV COMMENT:

## 2015-09-10 MED ORDER — SODIUM CHLORIDE 0.9% FLUSH
10.0000 mL | INTRAVENOUS | Status: DC | PRN
  Administered 2015-09-10 – 2015-09-17 (×6): 10 mL
  Filled 2015-09-10 (×6): qty 40

## 2015-09-10 MED ORDER — LANTHANUM CARBONATE 500 MG PO CHEW
1000.0000 mg | CHEWABLE_TABLET | Freq: Three times a day (TID) | ORAL | Status: DC
  Administered 2015-09-10 – 2015-09-18 (×13): 1000 mg via ORAL
  Filled 2015-09-10 (×25): qty 2

## 2015-09-10 MED ORDER — SODIUM CHLORIDE 0.9 % IV SOLN
510.0000 mg | Freq: Once | INTRAVENOUS | Status: AC
  Administered 2015-09-10: 510 mg via INTRAVENOUS
  Filled 2015-09-10 (×2): qty 17

## 2015-09-10 NOTE — Progress Notes (Signed)
Subjective: Interval History: has complaints N,still sleepy.  Objective: Vital signs in last 24 hours: Temp:  [96.7 F (35.9 C)-98.4 F (36.9 C)] 98.1 F (36.7 C) (04/02 0611) Pulse Rate:  [73-118] 118 (04/02 0611) Resp:  [14-20] 18 (04/02 0036) BP: (86-142)/(33-96) 92/36 mmHg (04/02 0611) SpO2:  [97 %-100 %] 100 % (04/02 0611) Weight:  [92.4 kg (203 lb 11.3 oz)-92.8 kg (204 lb 9.4 oz)] 92.4 kg (203 lb 11.3 oz) (04/02 0611) Weight change: -1.6 kg (-3 lb 8.4 oz)  Intake/Output from previous day: 04/01 0701 - 04/02 0700 In: 1713.3 [P.O.:240; I.V.:1473.3] Out: 200 [Urine:200] Intake/Output this shift:    General appearance: cooperative, slowed mentation and toxic Neck: RIJ cath Resp: diminished breath sounds bilaterally Cardio: S1, S2 normal and systolic murmur: holosystolic 2/6, blowing at apex GI: soft, pos bs, nontender Extremities: extremities normal, atraumatic, no cyanosis or edema  Lab Results:  Recent Labs  09/09/15 0338 09/10/15 0349  WBC 14.0* 16.3*  HGB 8.8* 8.9*  HCT 27.0* 27.6*  PLT 274 253   BMET:  Recent Labs  09/09/15 0958 09/10/15 0349  NA 139 139  K 4.7 4.6  CL 98* 102  CO2 20* 21*  GLUCOSE 107* 137*  BUN 114* 61*  CREATININE 18.66* 10.80*  CALCIUM 8.9 7.7*   No results for input(s): PTH in the last 72 hours. Iron Studies:  Recent Labs  09/09/15 1416  IRON 41  TIBC 214*    Studies/Results: [place] Renal  09/09/2015  CLINICAL DATA:  Acute onset of renal insufficiency. Initial encounter. EXAM: RENAL / URINARY TRACT ULTRASOUND COMPLETE COMPARISON:  CT of the pelvis performed 08/07/2015 FINDINGS: Right Kidney: Length: 11.3 cm. Echogenicity within normal limits. No mass or hydronephrosis visualized. Left Kidney: Length: 11.3 cm. Echogenicity within normal limits. No mass or hydronephrosis visualized. Bladder: Appears normal for degree of bladder distention. IMPRESSION: Unremarkable renal ultrasound. Electronically Signed   By: Roanna RaiderJeffery  Chang M.D.    On: 09/09/2015 05:38   Dg Chest Port 1 View  09/09/2015  CLINICAL DATA:  Hypertension, diabetes, acute renal failure EXAM: PORTABLE CHEST 1 VIEW COMPARISON:  08/10/2015 FINDINGS: Right IJ temporary dialysis catheter tip at the level of the mid SVC. Exam is slightly rotated to the left. Low lung volumes evident. Heart is enlarged with central vascular congestion. Minor basilar atelectasis. No effusion or pneumothorax. IMPRESSION: Right IJ temporary dialysis catheter tip mid SVC level. Cardiomegaly with vascular congestion Low lung volumes and basilar atelectasis Electronically Signed   By: Judie PetitM.  Shick M.D.   On: 09/09/2015 14:46    I have reviewed the patient's current medications.  Assessment/Plan: 1 AKI oliguric ATN. Low urinary indices with hypoperfusion, cont ivf. Severely uremic , do Hd today again 2 anemia give FE 3 ^ phos binders 4 DM controlled 5 fasciitis on wound care 6 HTN not an issue P HD, ^ ivf, wound cares    LOS: 2 days   Nancey Kreitz L 09/10/2015,8:57 AM

## 2015-09-10 NOTE — Progress Notes (Signed)
Progress Note   [name] FAO:[id number] DOB: 27-Feb-1956 DOA: 09/08/2015 PCP: [name], MD   Brief Narrative:   S[facility]ia [facility] is an 60 y.o. female with a PMH of hypertension and insulin-dependent diabetes was transferred from [facility] for evaluation of acute renal failure and associated hyperkalemia. Patient was recently hospitalized 08/07/15-08/15/15 for treatment of sepsis secondary to necrotizing fasciitis. She was discharged home on clindamycin. A follow-up check of her creatinine on 09/05/15 revealed marked elevation of 15.7 (1.16 at discharge).  Assessment/Plan:   Principal Problem:   Acute renal failure (ARF) (HCC) Admission creatinine 18.71. Creatinine last noted to be 1.16 at her prior hospital discharge. Nephrology consultation performed 09/09/15 and the patient was subsequently taken for hemodialysis. Creatinine down to 10.80 today. To be dialyzed again today. Urinalysis negative for casts. Renal ultrasound negative for hydronephrosis.   Active Problems:   Necrotizing fasciitis (HCC) of inner thigh Patient has a large open wound on her medial right thigh after debridement last month. She has been receiving wound care through home health services. She completed a course of clindamycin. One set of blood cultures was drawn on admission. Wound care nurse evaluated patient 09/09/15 and placed dressing change orders. We'll see if surgery will see her in-house while here.    IDDM (insulin dependent diabetes mellitus) (HCC) Home medications on hold including [place]. Hemoglobin A1c 10% on 09/05/15 indicating poor outpatient glycemic control. Currently being managed with 10 units of Lantus and moderate scale SSI 3 times a day. CBG 132-188.    History of hypertension Metoprolol on hold. Blood pressure slightly low this morning. Continue to hold metoprolol.    Hyperkalemia Status post treatment with calcium, dextrose, insulin, and Kayexalate. Potassium  WNL.    Generalized exfoliative dermatitis This appears to be new since her recent hospital discharge, and is concerning for a possible adverse reaction.    Normocytic anemia Consistent with usual baseline values. Likely anemia of chronic disease.    DVT Prophylaxis Subcutaneous heparin ordered.   Family Communication/Anticipated D/C date and plan/Code Status   Family Communication: No family currently at the bedside.Told patient to have the nurse page me when her family arrives. Disposition Plan/date: Home when renal function stable. Code Status: Full code.   Procedures and diagnostic studies:   US Renal  09/09/2015  CLINICAL DATA:  Acute onset of renal insufficiency. Initial encounter. EXAM: RENAL / URINARY TRACT ULTRASOUND COMPLETE COMPARISON:  CT of the pelvis performed 08/07/2015 FINDINGS: Right Kidney: Length: 11.3 cm. Echogenicity within normal limits. No mass or hydronephrosis visualized. Left Kidney: Length: 11.3 cm. Echogenicity within normal limits. No mass or hydronephrosis visualized. Bladder: Appears normal for degree of bladder distention. IMPRESSION: Unremarkable renal ultrasound. Electronically Signed   By: [name] M.D.   On: 09/09/2015 05:38   Dg Chest Port 1 View  09/09/2015  CLINICAL DATA:  Hypertension, diabetes, acute renal failure EXAM: PORTABLE CHEST 1 VIEW COMPARISON:  08/10/2015 FINDINGS: Right IJ temporary dialysis catheter tip at the level of the mid SVC. Exam is slightly rotated to the left. Low lung volumes evident. Heart is enlarged with central vascular congestion. Minor basilar atelectasis. No effusion or pneumothorax. IMPRESSION: Right IJ temporary dialysis catheter tip mid SVC level. Cardiomegaly with vascular congestion Low lung volumes and basilar atelectasis Electronically Signed   By: Judie Petit.  Shick M.D.   On: 09/09/2015 14:46    Medical Consultants:    Nephrology: Dr. Otho Bellows  Anti-Infectives:   Anti-infectives    None  Subjective:    [name] says she had some nausea last night, but none currently. Denies dyspnea. Continues to have pruritic skin.  Objective:    Filed Vitals:   09/09/15 1930 09/09/15 1947 09/10/15 0036 09/10/15 0611  BP: 86/52 88/60 97/34  92/36  Pulse: 106 105 108 118  Temp:  96.7 F (35.9 C) 98.4 F (36.9 C) 98.1 F (36.7 C)  TempSrc:  Oral Oral Oral  Resp:  20 18   Height:      Weight:  92.8 kg (204 lb 9.4 oz)  92.4 kg (203 lb 11.3 oz)  SpO2:  99% 100% 100%    Intake/Output Summary (Last 24 hours) at 09/10/15 0803 Last data filed at 09/10/15 [id number]  Gross per 24 hour  Intake 1713.33 ml  Output    200 ml  Net 1513.33 ml   Filed Weights   09/09/15 1620 09/09/15 1947 09/10/15 0611  Weight: 92.8 kg (204 lb 9.4 oz) 92.8 kg (204 lb 9.4 oz) 92.4 kg (203 lb 11.3 oz)    Exam: Gen:  Mildly sleepy  Cardiovascular:  Tachycardic, No M/R/G Respiratory:  Diminished but clear Gastrointestinal:  Abdomen soft, NT/ND, + BS Extremities:  Right thigh dressing intact Skin: Exfoliative lesions and uremic frost present   Data Reviewed:    Labs: Basic Metabolic Panel:  Recent Labs Lab 09/09/15 0338 09/09/15 0958 09/09/15 1416 09/10/15 0349  NA 138 139  --  139  K 5.2* 4.7  --  4.6  CL 98* 98*  --  102  CO2 20* 20*  --  21*  GLUCOSE 221* 107*  --  137*  BUN 114* 114*  --  61*  CREATININE 18.71* 18.66*  --  10.80*  CALCIUM 9.4 8.9  --  7.7*  MG 3.5*  --  3.6*  --   PHOS 17.1*  --   --  9.6*   GFR Estimated Creatinine Clearance: 5.9 mL/min (by C-G formula based on Cr of 10.8). Liver Function Tests:  Recent Labs Lab 09/09/15 0338 09/10/15 0349  AST 12*  --   ALT 17  --   ALKPHOS 127*  --   BILITOT 0.7  --   PROT 7.0  --   ALBUMIN 2.1* 1.9*   Coagulation profile  Recent Labs Lab 09/09/15 0338  INR 1.26    CBC:  Recent Labs Lab 09/09/15 0338 09/09/15 1416 09/10/15 0349  WBC 14.0*  --  16.3*  NEUTROABS 12.2* 13.2*  --   HGB 8.8*   --  8.9*  HCT 27.0*  --  27.6*  MCV 92.5  --  93.9  PLT 274  --  253   Cardiac Enzymes:  Recent Labs Lab 09/09/15 0338 09/09/15 0958 09/09/15 1416  CKTOTAL  --   --  142  TROPONINI 0.03 0.03 0.06*   CBG:  Recent Labs Lab 09/09/15 0637 09/09/15 1148 09/09/15 2108 09/10/15 0548  GLUCAP 188* 147* 115* 132*   Microbiology Recent Results (from the past 240 hour(s))  MRSA PCR Screening     Status: None   Collection Time: 09/09/15  2:29 AM  Result Value Ref Range Status   MRSA by PCR NEGATIVE NEGATIVE Final    Comment:        The GeneXpert MRSA Assay (FDA approved for NASAL specimens only), is one component of a comprehensive MRSA colonization surveillance program. It is not intended to diagnose MRSA infection nor to guide or monitor treatment for MRSA infections.   Culture, blood (routine x 2)  Status: None (Preliminary result)   Collection Time: 09/09/15  3:50 AM  Result Value Ref Range Status   Specimen Description BLOOD LEFT HAND  Final   Special Requests BOTTLES DRAWN AEROBIC ONLY  Final   Culture PENDING  Incomplete   Report Status PENDING  Incomplete     Medications:   . aspirin EC  81 mg Oral Daily  . FLUoxetine  20 mg Oral BID  . heparin  40 Units/kg Dialysis Once in dialysis  . heparin  5,000 Units Subcutaneous 3 times per day  . insulin aspart  0-15 Units Subcutaneous TID WC  . insulin glargine  10 Units Subcutaneous QHS  . lanthanum  1,000 mg Oral TID WC  . saccharomyces boulardii  250 mg Oral BID  . sodium chloride flush  3 mL Intravenous Q12H  . sodium chloride flush  3 mL Intravenous Q12H   Continuous Infusions: . sodium chloride 100 mL/hr at 09/10/15 0545    Time spent: 35 minutes.  The patient is medically complex with multiple co-morbidities and is at high risk for clinical deterioration and requires high complexity decision making.    LOS: 2 days   Vasil Juhasz  [facility] Pager [phone]. If unable to reach me by  pager, please call my cell phone at [phone].  *Please refer to [email or web address], password [email or web address] to get updated schedule on who will round on this patient, as hospitalists switch teams weekly. If 7PM-7AM, please contact night-coverage at [email or web address], password [email or web address] for any overnight needs.  09/10/2015, 8:03 AM

## 2015-09-10 NOTE — Procedures (Signed)
I was present at this session.  I have reviewed the session itself and made appropriate changes.  HD via temp cath. Even, bp ok. tol HD, going with low flows, and time limited due to severe uremia.  Donald Jacque L 4/2/20172:43 PM

## 2015-09-11 LAB — MAGNESIUM: Magnesium: 2.2 mg/dL (ref 1.7–2.4)

## 2015-09-11 LAB — GLUCOSE, CAPILLARY
GLUCOSE-CAPILLARY: 171 mg/dL — AB (ref 65–99)
GLUCOSE-CAPILLARY: 206 mg/dL — AB (ref 65–99)
GLUCOSE-CAPILLARY: 112 mg/dL — AB (ref 65–99)
Glucose-Capillary: 167 mg/dL — ABNORMAL HIGH (ref 65–99)

## 2015-09-11 LAB — RENAL FUNCTION PANEL
ALBUMIN: 1.7 g/dL — AB (ref 3.5–5.0)
Anion gap: 13 (ref 5–15)
BUN: 36 mg/dL — AB (ref 6–20)
CHLORIDE: 105 mmol/L (ref 101–111)
CO2: 21 mmol/L — ABNORMAL LOW (ref 22–32)
CREATININE: 6.77 mg/dL — AB (ref 0.44–1.00)
Calcium: 7.8 mg/dL — ABNORMAL LOW (ref 8.9–10.3)
GFR calc Af Amer: 7 mL/min — ABNORMAL LOW (ref >60)
GFR, EST NON AFRICAN AMERICAN: 6 mL/min — AB (ref >60)
GLUCOSE: 140 mg/dL — AB (ref 65–99)
Phosphorus: 6 mg/dL — ABNORMAL HIGH (ref 2.5–4.6)
Potassium: 4.4 mmol/L (ref 3.5–5.1)
Sodium: 139 mmol/L (ref 135–145)

## 2015-09-11 LAB — CBC
HEMATOCRIT: 23.9 % — AB (ref 36.0–46.0)
HEMOGLOBIN: 8.1 g/dL — AB (ref 12.0–15.0)
MCH: 32.1 pg (ref 26.0–34.0)
MCHC: 33.9 g/dL (ref 30.0–36.0)
MCV: 94.8 fL (ref 78.0–100.0)
PLATELETS: 188 10*3/uL (ref 150–400)
RBC: 2.52 MIL/uL — AB (ref 3.87–5.11)
RDW: 15.3 % (ref 11.5–15.5)
WBC: 19.2 10*3/uL — ABNORMAL HIGH (ref 4.0–10.5)

## 2015-09-11 LAB — URINE CULTURE: Culture: >=100000

## 2015-09-11 LAB — HEMOGLOBIN A1C
HEMOGLOBIN A1C: 9.7 % — AB (ref 4.8–5.6)
Mean Plasma Glucose: 232 mg/dL

## 2015-09-11 NOTE — Progress Notes (Signed)
Progress Note   EDOM SCHMUHL ZOX:[id number] DOB: [birth year] DOA: 09/08/2015 PCP: [name], MD   Brief Narrative:   [name] is an 60 y.o. female with a PMH of hypertension and insulin-dependent diabetes was transferred from [facility] for evaluation of acute renal failure and associated hyperkalemia. Patient was recently hospitalized 08/07/15-08/15/15 for treatment of sepsis secondary to necrotizing fasciitis. She was discharged home on clindamycin. A follow-up check of her creatinine on 09/05/15 revealed marked elevation of 15.7 (1.16 at discharge).  Assessment/Plan:   Principal Problem:   Acute renal failure (ARF) (HCC) Admission creatinine 18.71. Creatinine last noted to be 1.16 at her prior hospital discharge. Nephrology consultation performed 09/09/15 and the patient was subsequently taken for hemodialysis. Creatinine down to 6.77 today after 2 HD treatments. Urinalysis negative for casts. Renal ultrasound negative for hydronephrosis.   Active Problems:   Necrotizing fasciitis (HCC) of inner thigh Patient has a large open wound on her medial right thigh after debridement last month. She has been receiving wound care through home health services. She completed a course of clindamycin. One set of blood cultures was drawn on admission. Wound care nurse evaluated patient 09/09/15 and placed dressing change orders. We'll see if surgery will see her in-house while here.    IDDM (insulin dependent diabetes mellitus) (HCC) Home medications on hold including [place]. Hemoglobin A1c 10% on 09/05/15 indicating poor outpatient glycemic control. Currently being managed with 10 units of Lantus and moderate scale SSI 3 times a day. CBG 108-171.    History of hypertension Metoprolol on hold. Blood pressure slightly low this morning. Continue to hold metoprolol.    Hyperkalemia Status post treatment with calcium, dextrose, insulin, and Kayexalate. Potassium WNL.   Hyperphosphatemia Phosphorus normalizing with hemodialysis and treatment with Fosrenol.    Hypermagnesemia Secondary to renal failure. Recheck magnesium.    Generalized exfoliative dermatitis This appears to be new since her recent hospital discharge, and is concerning for a possible adverse reaction.    Normocytic anemia Consistent with usual baseline values. Likely anemia of chronic disease. Ferriheme given.    DVT Prophylaxis Subcutaneous heparin ordered.   Family Communication/Anticipated D/C date and plan/Code Status   Family Communication: No family currently at the bedside.Told patient to have the nurse page me when her family arrives. Disposition Plan/date: Home when renal function stable. Code Status: Full code.   Procedures and diagnostic studies:   US Renal  09/09/2015  CLINICAL DATA:  Acute onset of renal insufficiency. Initial encounter. EXAM: RENAL / URINARY TRACT ULTRASOUND COMPLETE COMPARISON:  CT of the pelvis performed 08/07/2015 FINDINGS: Right Kidney: Length: 11.3 cm. Echogenicity within normal limits. No mass or hydronephrosis visualized. Left Kidney: Length: 11.3 cm. Echogenicity within normal limits. No mass or hydronephrosis visualized. Bladder: Appears normal for degree of bladder distention. IMPRESSION: Unremarkable renal ultrasound. Electronically Signed   By: [name] M.D.   On: 09/09/2015 05:38   Dg Chest Port 1 View  09/09/2015  CLINICAL DATA:  Hypertension, diabetes, acute renal failure EXAM: PORTABLE CHEST 1 VIEW COMPARISON:  08/10/2015 FINDINGS: Right IJ temporary dialysis catheter tip at the level of the mid SVC. Exam is slightly rotated to the left. Low lung volumes evident. Heart is enlarged with central vascular congestion. Minor basilar atelectasis. No effusion or pneumothorax. IMPRESSION: Right IJ temporary dialysis catheter tip mid SVC level. Cardiomegaly with vascular congestion Low lung volumes and basilar atelectasis Electronically Signed    By: Judie Petit.  Shick M.D.   On: 09/09/2015  14:46    Medical Consultants:    Nephrology: Dr. Otho Bellows  Anti-Infectives:   Anti-infectives    None      Subjective:   [name] says she feels better. Still has some anorexia, but has tried to eat. Occasionally has nausea. No vomiting. No dyspnea.  Objective:    Filed Vitals:   09/10/15 1746 09/10/15 1842 09/10/15 2245 09/11/15 0533  BP: 102/46 160/90 111/52 93/55  Pulse: 112 100 127 102  Temp: 98.7 F (37.1 C)  99.2 F (37.3 C) 99.5 F (37.5 C)  TempSrc: Oral  Oral Oral  Resp: Height:      Weight:    94.121 kg (207 lb 8 oz)  SpO2: 98% 98% 100% 97%    Intake/Output Summary (Last 24 hours) at 09/11/15 0809 Last data filed at 09/11/15 0700  Gross per 24 hour  Intake   2310 ml  Output    175 ml  Net   2135 ml   Filed Weights   09/09/15 1947 09/10/15 0611 09/11/15 0533  Weight: 92.8 kg (204 lb 9.4 oz) 92.4 kg (203 lb 11.3 oz) 94.121 kg (207 lb 8 oz)    Exam: Gen:  More alert  Cardiovascular:  Tachycardic, No M/R/G Respiratory:  Diminished but clear Gastrointestinal:  Abdomen soft, NT/ND, + BS Extremities:  Right thigh dressing intact Skin: Exfoliative lesions and uremic frost present   Data Reviewed:    Labs: Basic Metabolic Panel:  Recent Labs Lab 09/09/15 0338 09/09/15 0958 09/09/15 1416 09/10/15 0349 09/11/15 0235  NA 138 139  --  139 139  K 5.2* 4.7  --  4.6 4.4  CL 98* 98*  --  102 105  CO2 20* 20*  --  21* 21*  GLUCOSE 221* 107*  --  137* 140*  BUN 114* 114*  --  61* 36*  CREATININE 18.71* 18.66*  --  10.80* 6.77*  CALCIUM 9.4 8.9  --  7.7* 7.8*  MG 3.5*  --  3.6*  --   --   PHOS 17.1*  --   --  9.6* 6.0*   GFR Estimated Creatinine Clearance: 9.4 mL/min (by C-G formula based on Cr of 6.77). Liver Function Tests:  Recent Labs Lab 09/09/15 0338 09/10/15 0349 09/11/15 0235  AST 12*  --   --   ALT 17  --   --   ALKPHOS 127*  --   --   BILITOT 0.7  --   --     PROT 7.0  --   --   ALBUMIN 2.1* 1.9* 1.7*   Coagulation profile  Recent Labs Lab 09/09/15 0338  INR 1.26    CBC:  Recent Labs Lab 09/09/15 0338 09/09/15 1416 09/10/15 0349 09/11/15 0235  WBC 14.0*  --  16.3* 19.2*  NEUTROABS 12.2* 13.2*  --   --   HGB 8.8*  --  8.9* 8.1*  HCT 27.0*  --  27.6* 23.9*  MCV 92.5  --  93.9 94.8  PLT 274  --  253 188   Cardiac Enzymes:  Recent Labs Lab 09/09/15 0338 09/09/15 0958 09/09/15 1416  CKTOTAL  --   --  142  TROPONINI 0.03 0.03 0.06*   CBG:  Recent Labs Lab 09/10/15 0548 09/10/15 1112 09/10/15 1857 09/10/15 2239 09/11/15 0646  GLUCAP 132* 136* 108* 111* 171*   Microbiology Recent Results (from the past 240 hour(s))  MRSA PCR Screening     Status: None   Collection Time:  09/09/15  2:29 AM  Result Value Ref Range Status   MRSA by PCR NEGATIVE NEGATIVE Final    Comment:        The GeneXpert MRSA Assay (FDA approved for NASAL specimens only), is one component of a comprehensive MRSA colonization surveillance program. It is not intended to diagnose MRSA infection nor to guide or monitor treatment for MRSA infections.   Culture, blood (routine x 2)     Status: None (Preliminary result)   Collection Time: 09/09/15  3:38 AM  Result Value Ref Range Status   Specimen Description BLOOD LEFT ARM  Final   Special Requests BOTTLES DRAWN AEROBIC AND ANAEROBIC 8ML  Final   Culture NO GROWTH 1 DAY  Final   Report Status PENDING  Incomplete  Culture, blood (routine x 2)     Status: None (Preliminary result)   Collection Time: 09/09/15  3:50 AM  Result Value Ref Range Status   Specimen Description BLOOD LEFT HAND  Final   Special Requests BOTTLES DRAWN AEROBIC ONLY 7ML  Final   Culture NO GROWTH 1 DAY  Final   Report Status PENDING  Incomplete  Urine culture     Status: None (Preliminary result)   Collection Time: 09/09/15  3:08 PM  Result Value Ref Range Status   Specimen Description URINE, RANDOM  Final   Special  Requests NONE  Final   Culture >=100,000 COLONIES/mL GRAM NEGATIVE RODS  Final   Report Status PENDING  Incomplete     Medications:   . aspirin EC  81 mg Oral Daily  . FLUoxetine  20 mg Oral BID  . heparin  5,000 Units Subcutaneous 3 times per day  . insulin aspart  0-15 Units Subcutaneous TID WC  . insulin glargine  10 Units Subcutaneous QHS  . lanthanum  1,000 mg Oral TID WC  . saccharomyces boulardii  250 mg Oral BID  . sodium chloride flush  3 mL Intravenous Q12H  . sodium chloride flush  3 mL Intravenous Q12H   Continuous Infusions: . sodium chloride 150 mL/hr at 09/10/15 1858    Time spent: 25 minutes.    LOS: 3 days   Selah Klang  [facility] Pager [phone]. If unable to reach me by pager, please call my cell phone at [phone].  *Please refer to [email or web address], password [email or web address] to get updated schedule on who will round on this patient, as hospitalists switch teams weekly. If 7PM-7AM, please contact night-coverage at [email or web address], password [email or web address] for any overnight needs.  09/11/2015, 8:09 AM

## 2015-09-11 NOTE — Consult Note (Signed)
WOC wound consult note Reason for Consult: Consult requested for back wounds.  WOC consult was previously performed on 4/1 for thigh wound; refer to consult notes. Wound type: Pt has dry peeling skin all over body.  She states it was the result of a medication reaction. It has caused the skin in the left lower back fold to have a partial thickness fissure, and along the middle lower back there is a full thickness wound where the skin has split open; appears to be also related to the reaction. Pt states the locations are painful when rubbing against the bed. Pressure Ulcer POA: These are NOT pressure injuries Measurement: Left posterior back fold partial thickness wound 3X.1X.1cm; red and dry, no odor or drainage. Middle lower back full thickness wound; 4X.5X.1cm, red and dry, no odor or drainage. Periwound: Intact skin surrounding Dressing procedure/placement/frequency: Foam dressing to protect and promote healing.  Pt could benefit from follow-up with dermatology service after discharge. Discussed plan of care with patient and she verbalized understanding. Please re-consult if further assistance is needed.  Thank-you,  Cammie Mcgeeawn Emileigh Kellett MSN, RN, CWOCN, [place]-AP, CNS [phone]

## 2015-09-11 NOTE — Progress Notes (Signed)
S: Feels better.  Appetite still poor.  Taking some po fluids O:BP 93/55 mmHg  Pulse 102  Temp(Src) 99.5 F (37.5 C) (Oral)  Resp 22  Ht 5\' 2"  (1.575 m)  Wt 94.121 kg (207 lb 8 oz)  BMI 37.94 kg/m2  SpO2 97%  Intake/Output Summary (Last 24 hours) at 09/11/15 0953 Last data filed at 09/11/15 0854  Gross per 24 hour  Intake   2310 ml  Output    175 ml  Net   2135 ml   Weight change: 1.321 kg (2 lb 14.6 oz) ZOX:WRUEAGen:awake and alert VWU:JWJXBCVS:Tachy, reg Resp:clear Abd:+ BS NTND Ext: large wound inner Rt thigh, Shin arms and lags peeling NEURO:CNI Ox3 no asterixis Rt IJ temp cath   . aspirin EC  81 mg Oral Daily  . FLUoxetine  20 mg Oral BID  . heparin  5,000 Units Subcutaneous 3 times per day  . insulin aspart  0-15 Units Subcutaneous TID WC  . insulin glargine  10 Units Subcutaneous QHS  . lanthanum  1,000 mg Oral TID WC  . saccharomyces boulardii  250 mg Oral BID  . sodium chloride flush  3 mL Intravenous Q12H  . sodium chloride flush  3 mL Intravenous Q12H   Dg Chest Port 1 View  09/09/2015  CLINICAL DATA:  Hypertension, diabetes, acute renal failure EXAM: PORTABLE CHEST 1 VIEW COMPARISON:  08/10/2015 FINDINGS: Right IJ temporary dialysis catheter tip at the level of the mid SVC. Exam is slightly rotated to the left. Low lung volumes evident. Heart is enlarged with central vascular congestion. Minor basilar atelectasis. No effusion or pneumothorax. IMPRESSION: Right IJ temporary dialysis catheter tip mid SVC level. Cardiomegaly with vascular congestion Low lung volumes and basilar atelectasis Electronically Signed   By: Judie PetitM.  Shick M.D.   On: 09/09/2015 14:46   BMET    Component Value Date/Time   NA 139 09/11/2015 0235   K 4.4 09/11/2015 0235   CL 105 09/11/2015 0235   CO2 21* 09/11/2015 0235   GLUCOSE 140* 09/11/2015 0235   BUN 36* 09/11/2015 0235   CREATININE 6.77* 09/11/2015 0235   CALCIUM 7.8* 09/11/2015 0235   GFRNONAA 6* 09/11/2015 0235   GFRAA 7* 09/11/2015 0235   CBC    Component Value Date/Time   WBC 19.2* 09/11/2015 0235   RBC 2.52* 09/11/2015 0235   HGB 8.1* 09/11/2015 0235   HCT 23.9* 09/11/2015 0235   PLT 188 09/11/2015 0235   MCV 94.8 09/11/2015 0235   MCH 32.1 09/11/2015 0235   MCHC 33.9 09/11/2015 0235   RDW 15.3 09/11/2015 0235   LYMPHSABS 0.8 09/09/2015 1416   MONOABS 0.6 09/09/2015 1416   EOSABS 0.8* 09/09/2015 1416   BASOSABS 0.0 09/09/2015 1416     Assessment: 1. ARF presumably due to ATN, on HD 2. Gm neg rod UTI  ID and sens P 3 DM 4. Fasciitis Rt thigh 5. Anemia, Fe def Received 1 dose feraheme  Plan: 1. Rec tx for UTI 2. Plan HD tomorrow though expect renal fx to recover at some point 3. Decrease IV fluids to 100cc/d   Keigan Girten T

## 2015-09-12 DIAGNOSIS — N3000 Acute cystitis without hematuria: Secondary | ICD-10-CM

## 2015-09-12 DIAGNOSIS — N390 Urinary tract infection, site not specified: Secondary | ICD-10-CM | POA: Diagnosis present

## 2015-09-12 LAB — RENAL FUNCTION PANEL
ALBUMIN: 1.5 g/dL — AB (ref 3.5–5.0)
ANION GAP: 13 (ref 5–15)
BUN: 42 mg/dL — ABNORMAL HIGH (ref 6–20)
CO2: 21 mmol/L — ABNORMAL LOW (ref 22–32)
CREATININE: 5.93 mg/dL — AB (ref 0.44–1.00)
Calcium: 8.4 mg/dL — ABNORMAL LOW (ref 8.9–10.3)
Chloride: 108 mmol/L (ref 101–111)
GFR calc Af Amer: 8 mL/min — ABNORMAL LOW (ref >60)
GFR calc non Af Amer: 7 mL/min — ABNORMAL LOW (ref >60)
GLUCOSE: 88 mg/dL (ref 65–99)
Phosphorus: 5.4 mg/dL — ABNORMAL HIGH (ref 2.5–4.6)
Potassium: 4.4 mmol/L (ref 3.5–5.1)
SODIUM: 142 mmol/L (ref 135–145)

## 2015-09-12 LAB — CBC WITH DIFFERENTIAL/PLATELET
BASOS ABS: 0.1 10*3/uL (ref 0.0–0.1)
Basophils Relative: 0 %
EOS PCT: 9 %
Eosinophils Absolute: 1.5 10*3/uL — ABNORMAL HIGH (ref 0.0–0.7)
HCT: 24.4 % — ABNORMAL LOW (ref 36.0–46.0)
HEMOGLOBIN: 7.8 g/dL — AB (ref 12.0–15.0)
LYMPHS PCT: 7 %
Lymphs Abs: 1.1 10*3/uL (ref 0.7–4.0)
MCH: 30.5 pg (ref 26.0–34.0)
MCHC: 32 g/dL (ref 30.0–36.0)
MCV: 95.3 fL (ref 78.0–100.0)
Monocytes Absolute: 0.7 10*3/uL (ref 0.1–1.0)
Monocytes Relative: 4 %
NEUTROS PCT: 80 %
Neutro Abs: 13.7 10*3/uL — ABNORMAL HIGH (ref 1.7–7.7)
PLATELETS: 210 10*3/uL (ref 150–400)
RBC: 2.56 MIL/uL — AB (ref 3.87–5.11)
RDW: 15.2 % (ref 11.5–15.5)
WBC: 17.1 10*3/uL — AB (ref 4.0–10.5)

## 2015-09-12 LAB — GLUCOSE, CAPILLARY
GLUCOSE-CAPILLARY: 80 mg/dL (ref 65–99)
GLUCOSE-CAPILLARY: 78 mg/dL (ref 65–99)
GLUCOSE-CAPILLARY: 78 mg/dL (ref 65–99)
Glucose-Capillary: 72 mg/dL (ref 65–99)

## 2015-09-12 MED ORDER — DEXTROSE 5 % IV SOLN
1.0000 g | INTRAVENOUS | Status: DC
  Administered 2015-09-12 – 2015-09-15 (×4): 1 g via INTRAVENOUS
  Filled 2015-09-12 (×6): qty 10

## 2015-09-12 NOTE — Progress Notes (Signed)
Progress Note   Sophia EllisonGenevieve A [name] ZOX:[id number] DOB: 08-Aug-1955 DOA: 09/08/2015 PCP: Louie BostonAPPER,[name], MD   Brief Narrative:   Sophia EllisonGenevieve A [name] is an 60 y.o. female with a PMH of hypertension and insulin-dependent diabetes [facility] evaluation of acute renal failure and associated hyperkalemia. Patient was recently hospitalized 08/07/15-08/15/15 for treatment of sepsis secondary to necrotizing fasciitis. She was discharged home on clindamycin. A follow-up check of her creatinine on 09/05/15 revealed marked elevation of 15.7 (1.16 at discharge).  Assessment/Plan:   Principal Problem:   Acute renal failure (ARF) (HCC) Admission creatinine 18.71. Creatinine last noted to be 1.16 at her prior hospital discharge. Nephrology consultation performed 09/09/15 and the patient was subsequently taken for hemodialysis.  Urinalysis negative for casts. Renal ultrasound negative for hydronephrosis. Etiology presumed to be ATN. Prognosis for recovery of renal function felt to be good. Continue HD support until renal function recovers.  Active Problems:   Gram-negative UTI Unclear if this was colonization versus a true UTI. Afebrile but WBC elevated. Allergic to penicillin. Will ask pharmacy to assist with choice of antibiotic. Rocephin started 09/12/15.    Necrotizing fasciitis (HCC) of inner thigh Patient has a large open wound on her medial right thigh after debridement last month. She has been receiving wound care through home health services. She completed a course of clindamycin. One set of blood cultures was drawn on admission. Wound care nurse evaluated patient 09/09/15 and placed dressing change orders. Wound seen by surgery, recommends ongoing W--->D dressing changes, F/U with Dr. Sheliah HatchKinsinger which was arranged.      IDDM (insulin dependent diabetes mellitus) (HCC) Home medications on hold including [place]. Hemoglobin A1c 10% on 09/05/15 indicating poor outpatient  glycemic control. Currently being managed with 10 units of Lantus and moderate scale SSI 3 times a day. CBG 80-206.    History of hypertension Metoprolol on hold. Blood pressure currently controlled off metoprolol.    Hyperkalemia Status post treatment with calcium, dextrose, insulin, and Kayexalate. Potassium WNL.    Hyperphosphatemia Phosphorus normalizing with hemodialysis and treatment with Fosrenol.    Hypermagnesemia Secondary to renal failure. Resolved with HD.    Generalized exfoliative dermatitis This appears to be new since her recent hospital discharge, and is concerning for a possible adverse reaction.    Normocytic anemia Consistent with usual baseline values. Likely anemia of chronic disease. Ferriheme given.    DVT Prophylaxis Subcutaneous heparin ordered.   Family Communication/Anticipated D/C date and plan/Code Status   Family Communication: No family currently at the bedside.Told patient to have the nurse page me when her family arrives. Disposition Plan/date: Home when renal function stable. Code Status: Full code.   Procedures and diagnostic studies:   [place] Renal  09/09/2015  CLINICAL DATA:  Acute onset of renal insufficiency. Initial encounter. EXAM: RENAL / URINARY TRACT ULTRASOUND COMPLETE COMPARISON:  CT of the pelvis performed 08/07/2015 FINDINGS: Right Kidney: Length: 11.3 cm. Echogenicity within normal limits. No mass or hydronephrosis visualized. Left Kidney: Length: 11.3 cm. Echogenicity within normal limits. No mass or hydronephrosis visualized. Bladder: Appears normal for degree of bladder distention. IMPRESSION: Unremarkable renal ultrasound. Electronically Signed   By: Roanna RaiderJeffery  Chang M.D.   On: 09/09/2015 05:38   Dg Chest Port 1 View  09/09/2015  CLINICAL DATA:  Hypertension, diabetes, acute renal failure EXAM: PORTABLE CHEST 1 VIEW COMPARISON:  08/10/2015 FINDINGS: Right IJ temporary dialysis catheter tip at the level of the mid SVC. Exam is slightly  rotated to  the left. Low lung volumes evident. Heart is enlarged with central vascular congestion. Minor basilar atelectasis. No effusion or pneumothorax. IMPRESSION: Right IJ temporary dialysis catheter tip mid SVC level. Cardiomegaly with vascular congestion Low lung volumes and basilar atelectasis Electronically Signed   By: Judie Petit.  Shick M.D.   On: 09/09/2015 14:46    Medical Consultants:    Nephrology: Dr. Otho Bellows  Anti-Infectives:   Anti-infectives    None      Subjective:   [name] denies nausea.  Still reports diminished appetite. No vomiting or diarrhea.  Denies pain.    Objective:    Filed Vitals:   09/12/15 0545 09/12/15 0700 09/12/15 0714 09/12/15 0730  BP: 122/53 120/61 106/55 102/55  Pulse: 118 105 105 103  Temp: 98.8 F (37.1 C) 98.2 F (36.8 C)    TempSrc: Oral Oral    Resp: 20 20    Height:      Weight: 96.389 kg (212 lb 8 oz) 96.8 kg (213 lb 6.5 oz)    SpO2: 98% 96%      Intake/Output Summary (Last 24 hours) at 09/12/15 0820 Last data filed at 09/12/15 0615  Gross per 24 hour  Intake    780 ml  Output    100 ml  Net    680 ml   Filed Weights   09/11/15 0533 09/12/15 0545 09/12/15 0700  Weight: 94.121 kg (207 lb 8 oz) 96.389 kg (212 lb 8 oz) 96.8 kg (213 lb 6.5 oz)    Exam: Gen:  More alert  Cardiovascular:  Tachycardic, No M/R/G Respiratory:  Diminished but clear Gastrointestinal:  Abdomen soft, NT/ND, + BS Extremities:  Right thigh dressing intact Skin: Exfoliative lesions and uremic frost present   Data Reviewed:    Labs: Basic Metabolic Panel:  Recent Labs Lab 09/09/15 0338 09/09/15 0958 09/09/15 1416 09/10/15 0349 09/11/15 0235 09/12/15 0532  NA 138 139  --  139 139 142  K 5.2* 4.7  --  4.6 4.4 4.4  CL 98* 98*  --  102 105 108  CO2 20* 20*  --  21* 21* 21*  GLUCOSE 221* 107*  --  137* 140* 88  BUN 114* 114*  --  61* 36* 42*  CREATININE 18.71* 18.66*  --  10.80* 6.77* 5.93*  CALCIUM 9.4 8.9  --  7.7* 7.8*  8.4*  MG 3.5*  --  3.6*  --  2.2  --   PHOS 17.1*  --   --  9.6* 6.0* 5.4*   GFR Estimated Creatinine Clearance: 11 mL/min (by C-G formula based on Cr of 5.93). Liver Function Tests:  Recent Labs Lab 09/09/15 0338 09/10/15 0349 09/11/15 0235 09/12/15 0532  AST 12*  --   --   --   ALT 17  --   --   --   ALKPHOS 127*  --   --   --   BILITOT 0.7  --   --   --   PROT 7.0  --   --   --   ALBUMIN 2.1* 1.9* 1.7* 1.5*   Coagulation profile  Recent Labs Lab 09/09/15 0338  INR 1.26    CBC:  Recent Labs Lab 09/09/15 0338 09/09/15 1416 09/10/15 0349 09/11/15 0235 09/12/15 0721  WBC 14.0*  --  16.3* 19.2* 17.1*  NEUTROABS 12.2* 13.2*  --   --  13.7*  HGB 8.8*  --  8.9* 8.1* 7.8*  HCT 27.0*  --  27.6* 23.9* 24.4*  MCV 92.5  --  93.9 94.8 95.3  PLT 274  --  253 188 210   Cardiac Enzymes:  Recent Labs Lab 09/09/15 0338 09/09/15 0958 09/09/15 1416  CKTOTAL  --   --  142  TROPONINI 0.03 0.03 0.06*   CBG:  Recent Labs Lab 09/11/15 0646 09/11/15 1137 09/11/15 1700 09/11/15 2125 09/12/15 0625  GLUCAP 171* 206* 167* 112* 80   Microbiology Recent Results (from the past 240 hour(s))  MRSA PCR Screening     Status: None   Collection Time: 09/09/15  2:29 AM  Result Value Ref Range Status   MRSA by PCR NEGATIVE NEGATIVE Final    Comment:        The GeneXpert MRSA Assay (FDA approved for NASAL specimens only), is one component of a comprehensive MRSA colonization surveillance program. It is not intended to diagnose MRSA infection nor to guide or monitor treatment for MRSA infections.   Culture, blood (routine x 2)     Status: None (Preliminary result)   Collection Time: 09/09/15  3:38 AM  Result Value Ref Range Status   Specimen Description BLOOD LEFT ARM  Final   Special Requests BOTTLES DRAWN AEROBIC AND ANAEROBIC  Final   Culture NO GROWTH 2 DAYS  Final   Report Status PENDING  Incomplete  Culture, blood (routine x 2)     Status: None (Preliminary  result)   Collection Time: 09/09/15  3:50 AM  Result Value Ref Range Status   Specimen Description BLOOD LEFT HAND  Final   Special Requests BOTTLES DRAWN AEROBIC ONLY  Final   Culture NO GROWTH 2 DAYS  Final   Report Status PENDING  Incomplete  Urine culture     Status: None   Collection Time: 09/09/15  3:08 PM  Result Value Ref Range Status   Specimen Description URINE, RANDOM  Final   Special Requests NONE  Final   Culture   Final    >=100,000 COLONIES/mL ENTEROBACTER AEROGENES >=100,000 COLONIES/mL GROUP B STREP(S.AGALACTIAE)ISOLATED TESTING AGAINST S. AGALACTIAE NOT ROUTINELY PERFORMED DUE TO PREDICTABILITY OF AMP/PEN/VAN SUSCEPTIBILITY.    Report Status 09/11/2015 FINAL  Final   Organism ID, Bacteria ENTEROBACTER AEROGENES  Final      Susceptibility   Enterobacter aerogenes - MIC*    CEFAZOLIN <=4 RESISTANT Resistant     CEFTRIAXONE <=1 SENSITIVE Sensitive     CIPROFLOXACIN <=0.25 SENSITIVE Sensitive     GENTAMICIN <=1 SENSITIVE Sensitive     IMIPENEM <=0.25 SENSITIVE Sensitive     NITROFURANTOIN 64 INTERMEDIATE Intermediate     TRIMETH/SULFA <=20 SENSITIVE Sensitive     PIP/TAZO 16 SENSITIVE Sensitive     * >=100,000 COLONIES/mL ENTEROBACTER AEROGENES     Medications:   . aspirin EC  81 mg Oral Daily  . FLUoxetine  20 mg Oral BID  . heparin  5,000 Units Subcutaneous 3 times per day  . insulin aspart  0-15 Units Subcutaneous TID WC  . insulin glargine  10 Units Subcutaneous QHS  . lanthanum  1,000 mg Oral TID WC  . saccharomyces boulardii  250 mg Oral BID  . sodium chloride flush  3 mL Intravenous Q12H  . sodium chloride flush  3 mL Intravenous Q12H   Continuous Infusions: . sodium chloride 100 mL/hr at 09/12/15 0200    Time spent: 25 minutes.    LOS: 4 days   RAMA,CHRISTINA  [facility] Pager [phone]. If unable to reach me by pager, please call my cell phone at [phone].  *Please refer to [email or web address], password  [email or web address] to get updated schedule on  who will round on this patient, as hospitalists switch teams weekly. If 7PM-7AM, please contact night-coverage at [email or web address], password [email or web address] for any overnight needs.  09/12/2015, 8:20 AM

## 2015-09-12 NOTE — Progress Notes (Signed)
RN went to change pt's dressing.  Pt says the NP just changed her dressing after she got back from HD.  Will continue to monitor pt.

## 2015-09-12 NOTE — Progress Notes (Signed)
Patient ID: [name], female   DOB: 1956-05-13, 60 y.o.   MRN: [id number]     [facility]      [address] [place], [place] [address]    Phone: [phone] FAX: [phone]     Subjective: Asked to see for a wound check.  S/p I&D right thigh nec fasciitis on 3/1. She has been home.  S/o changing BID  Objective:  Vital signs:  Filed Vitals:   09/12/15 0930 09/12/15 1000 09/12/15 1015 09/12/15 1114  BP: 104/57 102/56 104/57 103/45  Pulse: 108 106 106 109  Temp:   98.4 F (36.9 C) 98.2 F (36.8 C)  TempSrc:   Oral Oral  Resp:   20 18  Height:      Weight:   96.8 kg (213 lb 6.5 oz)   SpO2:   99% 97%    Last BM Date: 09/10/15  Intake/Output   Yesterday:  04/03 0701 - 04/04 0700 In: 780 [P.O.:780] Out: 100 [Urine:100] This shift:  Total I/O In: 0  Out: 300 [Urine:300]   Physical Exam: General: Pt awake/alert/oriented x4 in no acute distress Skin: wound is 100% clean, good granulating tissue, muscle no longer visible.     Problem List:   Principal Problem:   Acute renal failure (ARF) (HCC) Active Problems:   Necrotizing fasciitis ([place]) of inner thigh   IDDM (insulin dependent diabetes mellitus) ([place])   History of hypertension   Hyperkalemia   Generalized exfoliative dermatitis   Normocytic anemia   Hyperphosphatemia   Hypermagnesemia   UTI (urinary tract infection)    Results:   Labs: Results for orders placed or performed during the hospital encounter of 09/08/15 (from the past 48 hour(s))  Glucose, capillary     Status: Abnormal   Collection Time: 09/10/15  6:57 PM  Result Value Ref Range   Glucose-Capillary 108 (H) 65 - 99 mg/dL  Glucose, capillary     Status: Abnormal   Collection Time: 09/10/15 10:39 PM  Result Value Ref Range   Glucose-Capillary 111 (H) 65 - 99 mg/dL   Comment 1 Notify RN    Comment 2 Document in Chart   CBC     Status: Abnormal   Collection Time: 09/11/15   2:35 AM  Result Value Ref Range   WBC 19.2 (H) 4.0 - 10.5 K/uL   RBC 2.52 (L) 3.87 - 5.11 MIL/uL   Hemoglobin 8.1 (L) 12.0 - 15.0 g/dL   HCT 23.9 (L) 36.0 - 46.0 %   MCV 94.8 78.0 - 100.0 fL   MCH 32.1 26.0 - 34.0 pg   MCHC 33.9 30.0 - 36.0 g/dL   RDW 15.3 11.5 - 15.5 %   Platelets 188 150 - 400 K/uL  Renal function panel     Status: Abnormal   Collection Time: 09/11/15  2:35 AM  Result Value Ref Range   Sodium 139 135 - 145 mmol/L   Potassium 4.4 3.5 - 5.1 mmol/L   Chloride 105 101 - 111 mmol/L   CO2 21 (L) 22 - 32 mmol/L   Glucose, Bld 140 (H) 65 - 99 mg/dL   BUN 36 (H) 6 - 20 mg/dL   Creatinine, Ser 6.77 (H) 0.44 - 1.00 mg/dL    Comment: DELTA CHECK NOTED   Calcium 7.8 (L) 8.9 - 10.3 mg/dL   Phosphorus 6.0 (H) 2.5 - 4.6 mg/dL   Albumin 1.7 (L) 3.5 - 5.0 g/dL   GFR calc non Af Wyvonnia Lora  6 (L) >60 mL/min   GFR calc Af Amer 7 (L) >60 mL/min    Comment: (NOTE) The eGFR has been calculated using the CKD EPI equation. This calculation has not been validated in all clinical situations. eGFR's persistently <60 mL/min signify possible Chronic Kidney Disease.    Anion gap 13 5 - 15  Magnesium     Status: None   Collection Time: 09/11/15  2:35 AM  Result Value Ref Range   Magnesium 2.2 1.7 - 2.4 mg/dL  Glucose, capillary     Status: Abnormal   Collection Time: 09/11/15  6:46 AM  Result Value Ref Range   Glucose-Capillary 171 (H) 65 - 99 mg/dL  Glucose, capillary     Status: Abnormal   Collection Time: 09/11/15 11:37 AM  Result Value Ref Range   Glucose-Capillary 206 (H) 65 - 99 mg/dL   Comment 1 Notify RN   Glucose, capillary     Status: Abnormal   Collection Time: 09/11/15  5:00 PM  Result Value Ref Range   Glucose-Capillary 167 (H) 65 - 99 mg/dL   Comment 1 Notify RN   Glucose, capillary     Status: Abnormal   Collection Time: 09/11/15  9:25 PM  Result Value Ref Range   Glucose-Capillary 112 (H) 65 - 99 mg/dL   Comment 1 Notify RN    Comment 2 Document in Chart   Renal  function panel     Status: Abnormal   Collection Time: 09/12/15  5:32 AM  Result Value Ref Range   Sodium 142 135 - 145 mmol/L   Potassium 4.4 3.5 - 5.1 mmol/L   Chloride 108 101 - 111 mmol/L   CO2 21 (L) 22 - 32 mmol/L   Glucose, Bld 88 65 - 99 mg/dL   BUN 42 (H) 6 - 20 mg/dL   Creatinine, Ser 5.93 (H) 0.44 - 1.00 mg/dL   Calcium 8.4 (L) 8.9 - 10.3 mg/dL   Phosphorus 5.4 (H) 2.5 - 4.6 mg/dL   Albumin 1.5 (L) 3.5 - 5.0 g/dL   GFR calc non Af Amer 7 (L) >60 mL/min   GFR calc Af Amer 8 (L) >60 mL/min    Comment: (NOTE) The eGFR has been calculated using the CKD EPI equation. This calculation has not been validated in all clinical situations. eGFR's persistently <60 mL/min signify possible Chronic Kidney Disease.    Anion gap 13 5 - 15  Glucose, capillary     Status: None   Collection Time: 09/12/15  6:25 AM  Result Value Ref Range   Glucose-Capillary 80 65 - 99 mg/dL  CBC with Differential/Platelet     Status: Abnormal   Collection Time: 09/12/15  7:21 AM  Result Value Ref Range   WBC 17.1 (H) 4.0 - 10.5 K/uL   RBC 2.56 (L) 3.87 - 5.11 MIL/uL   Hemoglobin 7.8 (L) 12.0 - 15.0 g/dL   HCT 24.4 (L) 36.0 - 46.0 %   MCV 95.3 78.0 - 100.0 fL   MCH 30.5 26.0 - 34.0 pg   MCHC 32.0 30.0 - 36.0 g/dL   RDW 15.2 11.5 - 15.5 %   Platelets 210 150 - 400 K/uL   Neutrophils Relative % 80 %   Neutro Abs 13.7 (H) 1.7 - 7.7 K/uL   Lymphocytes Relative 7 %   Lymphs Abs 1.1 0.7 - 4.0 K/uL   Monocytes Relative 4 %   Monocytes Absolute 0.7 0.1 - 1.0 K/uL   Eosinophils Relative 9 %   Eosinophils Absolute  1.5 (H) 0.0 - 0.7 K/uL   Basophils Relative 0 %   Basophils Absolute 0.1 0.0 - 0.1 K/uL    Imaging / Studies: No results found.  Medications / Allergies:  Scheduled Meds: . aspirin EC  81 mg Oral Daily  . cefTRIAXone (ROCEPHIN)  IV  1 g Intravenous Q24H  . FLUoxetine  20 mg Oral BID  . heparin  5,000 Units Subcutaneous 3 times per day  . insulin aspart  0-15 Units Subcutaneous TID WC   . insulin glargine  10 Units Subcutaneous QHS  . lanthanum  1,000 mg Oral TID WC  . saccharomyces boulardii  250 mg Oral BID  . sodium chloride flush  3 mL Intravenous Q12H  . sodium chloride flush  3 mL Intravenous Q12H   Continuous Infusions: . sodium chloride 100 mL/hr at 09/12/15 0200   PRN Meds:.sodium chloride, acetaminophen, albuterol, ondansetron **OR** ondansetron (ZOFRAN) IV, oxyCODONE, polyethylene glycol, sodium chloride flush, sodium chloride flush  Antibiotics: Anti-infectives    Start     Dose/Rate Route Frequency Ordered Stop   09/12/15 1000  cefTRIAXone (ROCEPHIN) 1 g in dextrose 5 % 50 mL IVPB     1 g 100 mL/hr over 30 Minutes Intravenous Every 24 hours 09/12/15 0905          Assessment/Plan I&D right thigh  08/09/15---Dr. Kieth Brightly Wound is clean, continue with BID wet to dry dressing changes. Follow up arranged with Dr. Kieth Brightly Please call for further assistance  Erby Pian, [facility] Surgery Pager [phone](7A-4:30P) For consults and floor pages call [phone](7A-4:30P)  09/12/2015 11:31 AM

## 2015-09-12 NOTE — Progress Notes (Signed)
Pharmacy - Update Allergies/Recommedation for UTI  Spoke with patient to clarify PCN allergy. She reports allergy was prior to Feb 2017 admit for necrotizing fascitis. She states she had itching, rash, but no SOB. She was given Zosyn from 08/07/15 >> 08/13/15 without complication noted. ARF noted, on HD currently.  UCx: Enterobacter/Group B Strep - sensitive to ceftriaxone  Plan: Removing PCN allergy as tolerated Zosyn recently Cephalosporins should also be ok to use Treat UTI with ceftriaxone since no adjustment needed for renal function  [facility] AdvocateJennifer [place], [address] BoulevardPharm.D., BCPS Clinical Pharmacist Pager: [phone] 09/12/2015 10:46 AM

## 2015-09-12 NOTE — Progress Notes (Signed)
S: Appetite still poor.  Pt seen on HD.  UO remains low O:BP 98/51 mmHg  Pulse 106  Temp(Src) 98.2 F (36.8 C) (Oral)  Resp 20  Ht 5\' 2"  (1.575 m)  Wt 96.8 kg (213 lb 6.5 oz)  BMI 39.02 kg/m2  SpO2 96%  Intake/Output Summary (Last 24 hours) at 09/12/15 0917 Last data filed at 09/12/15 0615  Gross per 24 hour  Intake    780 ml  Output    100 ml  Net    680 ml   Weight change: 2.268 kg (5 lb) ZOX:WRUEAGen:awake and alert VWU:JWJXBCVS:Tachy, reg Resp:clear Abd:+ BS NTND Ext: large wound inner Rt thigh NEURO:CNI Ox3 no asterixis Rt IJ temp cath   . aspirin EC  81 mg Oral Daily  . cefTRIAXone (ROCEPHIN)  IV  1 g Intravenous Q24H  . FLUoxetine  20 mg Oral BID  . heparin  5,000 Units Subcutaneous 3 times per day  . insulin aspart  0-15 Units Subcutaneous TID WC  . insulin glargine  10 Units Subcutaneous QHS  . lanthanum  1,000 mg Oral TID WC  . saccharomyces boulardii  250 mg Oral BID  . sodium chloride flush  3 mL Intravenous Q12H  . sodium chloride flush  3 mL Intravenous Q12H   No results found. BMET    Component Value Date/Time   NA 142 09/12/2015 0532   K 4.4 09/12/2015 0532   CL 108 09/12/2015 0532   CO2 21* 09/12/2015 0532   GLUCOSE 88 09/12/2015 0532   BUN 42* 09/12/2015 0532   CREATININE 5.93* 09/12/2015 0532   CALCIUM 8.4* 09/12/2015 0532   GFRNONAA 7* 09/12/2015 0532   GFRAA 8* 09/12/2015 0532   CBC    Component Value Date/Time   WBC 17.1* 09/12/2015 0721   RBC 2.56* 09/12/2015 0721   HGB 7.8* 09/12/2015 0721   HCT 24.4* 09/12/2015 0721   PLT 210 09/12/2015 0721   MCV 95.3 09/12/2015 0721   MCH 30.5 09/12/2015 0721   MCHC 32.0 09/12/2015 0721   RDW 15.2 09/12/2015 0721   LYMPHSABS 1.1 09/12/2015 0721   MONOABS 0.7 09/12/2015 0721   EOSABS 1.5* 09/12/2015 0721   BASOSABS 0.1 09/12/2015 0721     Assessment: 1. ARF presumably due to ATN, on HD,  UO remains low.  I/O's are inaccurate 2. Enterobacter UTI, on ceftriaxone 3 DM 4. Fasciitis Rt thigh 5. Anemia,  Fe def Received 1 dose feraheme  Plan: 1. Decrease IV fluids to 50cc/hr 2. Check I/O's 3. Daily labs   [name]

## 2015-09-13 LAB — GLUCOSE, CAPILLARY
GLUCOSE-CAPILLARY: 261 mg/dL — AB (ref 65–99)
GLUCOSE-CAPILLARY: 141 mg/dL — AB (ref 65–99)
Glucose-Capillary: 92 mg/dL (ref 65–99)
Glucose-Capillary: 162 mg/dL — ABNORMAL HIGH (ref 65–99)

## 2015-09-13 LAB — RENAL FUNCTION PANEL
ANION GAP: 12 (ref 5–15)
Albumin: 1.5 g/dL — ABNORMAL LOW (ref 3.5–5.0)
BUN: 21 mg/dL — AB (ref 6–20)
CHLORIDE: 105 mmol/L (ref 101–111)
CO2: 26 mmol/L (ref 22–32)
Calcium: 8 mg/dL — ABNORMAL LOW (ref 8.9–10.3)
Creatinine, Ser: 3.52 mg/dL — ABNORMAL HIGH (ref 0.44–1.00)
GFR calc Af Amer: 15 mL/min — ABNORMAL LOW (ref >60)
GFR calc non Af Amer: 13 mL/min — ABNORMAL LOW (ref >60)
GLUCOSE: 94 mg/dL (ref 65–99)
POTASSIUM: 3.9 mmol/L (ref 3.5–5.1)
Phosphorus: 4 mg/dL (ref 2.5–4.6)
Sodium: 143 mmol/L (ref 135–145)

## 2015-09-13 MED ORDER — INSULIN GLARGINE 100 UNIT/ML ~~LOC~~ SOLN
5.0000 [IU] | Freq: Every day | SUBCUTANEOUS | Status: DC
  Administered 2015-09-13 – 2015-09-15 (×3): 5 [IU] via SUBCUTANEOUS
  Filled 2015-09-13 (×5): qty 0.05

## 2015-09-13 NOTE — Evaluation (Signed)
Physical Therapy Evaluation Patient Details Name: [name] MRN: [id number] DOB: 10/27/1955 Today's Date: 09/13/2015   History of Present Illness  [name] is an 60 y.o. female with a PMH of hypertension and insulin-dependent diabetes was transferred from [facility] for evaluation of acute renal failure and associated hyperkalemia. Patient was recently hospitalized 08/07/15-08/15/15 for treatment of sepsis secondary to necrotizing fasciitis. She was discharged home on clindamycin. A follow-up check of her creatinine on 09/05/15 revealed marked elevation of 15.7  Clinical Impression  Patient presents with decreased independence with mobility due to deficits listed in PT problem list.  She will benefit from skilled PT in the acute setting to allow return home with family support and to resume HHPT.      Follow Up Recommendations Home health PT;Supervision - Intermittent    Equipment Recommendations  3in1 (PT)    Recommendations for Other Services       Precautions / Restrictions Precautions Precautions: Fall Precaution Comments: R thigh and sacral wound      Mobility  Bed Mobility Overal bed mobility: Needs Assistance Bed Mobility: Supine to Sit     Supine to sit: Min guard     General bed mobility comments: assist for scooting  Transfers Overall transfer level: Needs assistance Equipment used: Rolling walker (2 wheeled) Transfers: Sit to/from Stand Sit to Stand: Min guard         General transfer comment: performed x 5 for strengthening; variety of hand placement  Ambulation/Gait Ambulation/Gait assistance: Supervision Ambulation Distance (Feet): 100 Feet Assistive device: Rolling walker (2 wheeled) Gait Pattern/deviations: Step-through pattern;Decreased stride length;Trunk flexed     General Gait Details: slow pace, slight flexed posture  Stairs            Wheelchair Mobility    Modified Rankin (Stroke Patients Only)        Balance Overall balance assessment: Needs assistance         Standing balance support: No upper extremity supported Standing balance-Leahy Scale: Fair Standing balance comment: able to take hands off walker while marching in place, no LOB                             Pertinent Vitals/Pain Pain Assessment: Faces Faces Pain Scale: Hurts even more Pain Location: sacral wound with sitting Pain Descriptors / Indicators: Sore Pain Intervention(s): Limited activity within patient's tolerance;Monitored during session;Repositioned    Home Living Family/patient expects to be discharged to:: Private residence Living Arrangements: Children;Spouse/significant other;Other relatives Available Help at Discharge: Family Type of Home: House Home Access: Stairs to enter Entrance Stairs-Rails: Doctor, general practice of Steps: 4 Home Layout: Two level;Laundry or work area in [employer] Equipment: Environmental consultant - 2 wheels Additional Comments: supposed to get 3:1 from HHPT    Prior Function Level of Independence: Needs assistance      ADL's / Homemaking Assistance Needed: significant other has been helping her bathe and dress as well as cooking and cleaning        Hand Dominance        Extremity/Trunk Assessment               Lower Extremity Assessment: Overall WFL for tasks assessed         Communication   Communication: No difficulties  Cognition Arousal/Alertness: Awake/alert Behavior During Therapy: WFL for tasks assessed/performed Overall Cognitive Status: Within Functional Limits for tasks assessed  General Comments General comments (skin integrity, edema, etc.): wound on anterior aspect R thigh covered with dressing, on buttocks covered minimally with Mepilex pads.     Exercises Other Exercises Other Exercises: sit<>stand x 5 with minimal UE support Other Exercises: standing marching in place x 5 reps, 5 sets       Assessment/Plan    PT Assessment Patient needs continued PT services  PT Diagnosis Generalized weakness;Abnormality of gait   PT Problem List Decreased strength;Decreased activity tolerance;Decreased balance;Decreased knowledge of use of DME;Decreased mobility;Pain  PT Treatment Interventions DME instruction;Gait training;Stair training;Functional mobility training;Patient/family education;Therapeutic activities;Therapeutic exercise;Balance training   PT Goals (Current goals can be found in the Care Plan section) Acute Rehab PT Goals Patient Stated Goal: To go home PT Goal Formulation: With patient Time For Goal Achievement: 09/20/15 Potential to Achieve Goals: Good    Frequency Min 3X/week   Barriers to discharge        Co-evaluation               End of Session Equipment Utilized During Treatment: Gait belt Activity Tolerance: Patient tolerated treatment well Patient left: in bed;with call bell/phone within reach           Time: 1402-1426 PT Time Calculation (min) (ACUTE ONLY): 24 min   Charges:   PT Evaluation $PT Eval Moderate Complexity: 1 Procedure PT Treatments $Gait Training: 8-22 mins   PT G CodesElray [name]:        [name] 09/13/2015, 4:47 PM  [name], PT [phone] 09/13/2015

## 2015-09-13 NOTE — Progress Notes (Signed)
S: Feeling better  PO intake improving O:BP 98/40 mmHg  Pulse 105  Temp(Src) 98.7 F (37.1 C) (Oral)  Resp 18  Ht 5\' 2"  (1.575 m)  Wt 97.07 kg (214 lb)  BMI 39.13 kg/m2  SpO2 100%  Intake/Output Summary (Last 24 hours) at 09/13/15 1118 Last data filed at 09/13/15 0854  Gross per 24 hour  Intake   2060 ml  Output    100 ml  Net   1960 ml   Weight change: 0.411 kg (14.5 oz) ONG:EXBMWGen:awake and alert UXL:KGMWNCVS:Tachy, reg Resp:clear Abd:+ BS NTND Ext: large wound inner Rt thigh NEURO:CNI Ox3 no asterixis Rt IJ temp cath   . aspirin EC  81 mg Oral Daily  . cefTRIAXone (ROCEPHIN)  IV  1 g Intravenous Q24H  . FLUoxetine  20 mg Oral BID  . heparin  5,000 Units Subcutaneous 3 times per day  . insulin aspart  0-15 Units Subcutaneous TID WC  . insulin glargine  5 Units Subcutaneous QHS  . lanthanum  1,000 mg Oral TID WC  . saccharomyces boulardii  250 mg Oral BID  . sodium chloride flush  3 mL Intravenous Q12H  . sodium chloride flush  3 mL Intravenous Q12H   No results found. BMET    Component Value Date/Time   NA 143 09/13/2015 0500   K 3.9 09/13/2015 0500   CL 105 09/13/2015 0500   CO2 26 09/13/2015 0500   GLUCOSE 94 09/13/2015 0500   BUN 21* 09/13/2015 0500   CREATININE 3.52* 09/13/2015 0500   CALCIUM 8.0* 09/13/2015 0500   GFRNONAA 13* 09/13/2015 0500   GFRAA 15* 09/13/2015 0500   CBC    Component Value Date/Time   WBC 17.1* 09/12/2015 0721   RBC 2.56* 09/12/2015 0721   HGB 7.8* 09/12/2015 0721   HCT 24.4* 09/12/2015 0721   PLT 210 09/12/2015 0721   MCV 95.3 09/12/2015 0721   MCH 30.5 09/12/2015 0721   MCHC 32.0 09/12/2015 0721   RDW 15.2 09/12/2015 0721   LYMPHSABS 1.1 09/12/2015 0721   MONOABS 0.7 09/12/2015 0721   EOSABS 1.5* 09/12/2015 0721   BASOSABS 0.1 09/12/2015 0721     Assessment: 1. ARF presumably due to ATN, on HD,  UO low but improving 2. Enterobacter UTI, on ceftriaxone 3 DM 4. Fasciitis Rt thigh 5. Anemia, Fe def Received 1 dose  feraheme  Plan: 1.Plan HD tomorrow  [name] T

## 2015-09-13 NOTE — Progress Notes (Signed)
Progress Note   [name]:[id number] DOB: January 05, 1956 DOA: 09/08/2015 PCP: [name], MD   Brief Narrative:   [name] is an 60 y.o. female with a PMH of hypertension and insulin-dependent diabetes was transferred from [place] [facility] for evaluation of acute renal failure and associated hyperkalemia. Patient was recently hospitalized 08/07/15-08/15/15 for treatment of sepsis secondary to necrotizing fasciitis. She was discharged home on clindamycin. A follow-up check of her creatinine on 09/05/15 revealed marked elevation of 15.7 (1.16 at discharge).  Assessment/Plan:   Principal Problem:   Acute renal failure (ARF) (HCC) Admission creatinine 18.71. Creatinine last noted to be 1.16 at her prior hospital discharge. Nephrology consultation performed 09/09/15 and the patient was subsequently taken for hemodialysis.  Urinalysis negative for casts. Renal ultrasound negative for hydronephrosis. Etiology presumed to be ATN. Prognosis for recovery of renal function felt to be good. Continue HD support until renal function recovers.  Active Problems:   Enterobacter and group B strep UTI Unclear if this was colonization versus a true UTI. Afebrile but WBC elevated. Rocephin started 09/12/15.    Necrotizing fasciitis (HCC) of inner thigh Patient has a large open wound on her medial right thigh after debridement last month. She has been receiving wound care through home health services. She completed a course of clindamycin. One set of blood cultures was drawn on admission. Wound care nurse evaluated patient 09/09/15 and placed dressing change orders. Wound seen by surgery, recommends ongoing W--->D dressing changes, F/U with Dr. Sheliah Hatch which was arranged.      IDDM (insulin dependent diabetes mellitus) (HCC) Home medications on hold including [place]. Hemoglobin A1c 10% on 09/05/15 indicating poor outpatient glycemic control. Currently being managed with 10 units of Lantus  and moderate scale SSI 3 times a day. CBG 72-92. Will decrease Lantus to 5 units.    History of hypertension Metoprolol on hold. Blood pressure currently controlled off metoprolol.    Hyperkalemia Status post treatment with calcium, dextrose, insulin, and Kayexalate. Potassium WNL.    Hyperphosphatemia Phosphorus normalized with hemodialysis and treatment with Fosrenol.    Hypermagnesemia Secondary to renal failure. Resolved with HD.    Generalized exfoliative dermatitis This appears to be new since her recent hospital discharge, and is concerning for a possible adverse reaction.    Normocytic anemia Consistent with usual baseline values. Likely anemia of chronic disease. Ferriheme given.    DVT Prophylaxis Subcutaneous heparin ordered.   Family Communication/Anticipated D/C date and plan/Code Status   Family Communication: No family currently at the bedside.Told patient to have the nurse page me when her family arrives. Disposition Plan/date: Home when renal function stable. PT evaluation requested for discharge planning. Code Status: Full code.   Procedures and diagnostic studies:   US Renal  09/09/2015  CLINICAL DATA:  Acute onset of renal insufficiency. Initial encounter. EXAM: RENAL / URINARY TRACT ULTRASOUND COMPLETE COMPARISON:  CT of the pelvis performed 08/07/2015 FINDINGS: Right Kidney: Length: 11.3 cm. Echogenicity within normal limits. No mass or hydronephrosis visualized. Left Kidney: Length: 11.3 cm. Echogenicity within normal limits. No mass or hydronephrosis visualized. Bladder: Appears normal for degree of bladder distention. IMPRESSION: Unremarkable renal ultrasound. Electronically Signed   By: [name] M.D.   On: 09/09/2015 05:38   Dg Chest Port 1 View  09/09/2015  CLINICAL DATA:  Hypertension, diabetes, acute renal failure EXAM: PORTABLE CHEST 1 VIEW COMPARISON:  08/10/2015 FINDINGS: Right IJ temporary dialysis catheter tip at the level of the mid SVC.  Exam  is slightly rotated to the left. Low lung volumes evident. Heart is enlarged with central vascular congestion. Minor basilar atelectasis. No effusion or pneumothorax. IMPRESSION: Right IJ temporary dialysis catheter tip mid SVC level. Cardiomegaly with vascular congestion Low lung volumes and basilar atelectasis Electronically Signed   By: Judie Petit.  Shick M.D.   On: 09/09/2015 14:46    Medical Consultants:    Nephrology: Dr. Otho Bellows  Anti-Infectives:   Anti-infectives    Start     Dose/Rate Route Frequency Ordered Stop   09/12/15 1000  cefTRIAXone (ROCEPHIN) 1 g in dextrose 5 % 50 mL IVPB     1 g 100 mL/hr over 30 Minutes Intravenous Every 24 hours 09/12/15 0905        Subjective:   [name] denies nausea.  Appetite improving. No vomiting or diarrhea.  Denies pain.    Objective:    Filed Vitals:   09/12/15 1015 09/12/15 1114 09/12/15 2210 09/13/15 0528  BP: 104/57 103/45 91/48 98/40   Pulse: 106 109 110 105  Temp: 98.4 F (36.9 C) 98.2 F (36.8 C) 99.5 F (37.5 C) 98.7 F (37.1 C)  TempSrc: Oral Oral Oral Oral  Resp: Height:      Weight: 96.8 kg (213 lb 6.5 oz)   97.07 kg (214 lb)  SpO2: 99% 97% 100% 100%    Intake/Output Summary (Last 24 hours) at 09/13/15 0811 Last data filed at 09/13/15 0700  Gross per 24 hour  Intake   1820 ml  Output    400 ml  Net   1420 ml   Filed Weights   09/12/15 0700 09/12/15 1015 09/13/15 0528  Weight: 96.8 kg (213 lb 6.5 oz) 96.8 kg (213 lb 6.5 oz) 97.07 kg (214 lb)    Exam: Gen: Alert, sitting up in chair Cardiovascular:  Tachycardic, No M/R/G Respiratory:  Diminished but clear Gastrointestinal:  Abdomen soft, NT/ND, + BS Extremities:  Right thigh dressing intact  Data Reviewed:    Labs: Basic Metabolic Panel:  Recent Labs Lab 09/09/15 0338 09/09/15 0958 09/09/15 1416 09/10/15 0349 09/11/15 0235 09/12/15 0532 09/13/15 0500  NA 138 139  --  139 139 142 143  K 5.2* 4.7  --  4.6 4.4 4.4  3.9  CL 98* 98*  --  102 105 108 105  CO2 20* 20*  --  21* 21* 21* 26  GLUCOSE 221* 107*  --  137* 140* 88 94  BUN 114* 114*  --  61* 36* 42* 21*  CREATININE 18.71* 18.66*  --  10.80* 6.77* 5.93* 3.52*  CALCIUM 9.4 8.9  --  7.7* 7.8* 8.4* 8.0*  MG 3.5*  --  3.6*  --  2.2  --   --   PHOS 17.1*  --   --  9.6* 6.0* 5.4* 4.0   GFR Estimated Creatinine Clearance: 18.5 mL/min (by C-G formula based on Cr of 3.52). Liver Function Tests:  Recent Labs Lab 09/09/15 0338 09/10/15 0349 09/11/15 0235 09/12/15 0532 09/13/15 0500  AST 12*  --   --   --   --   ALT 17  --   --   --   --   ALKPHOS 127*  --   --   --   --   BILITOT 0.7  --   --   --   --   PROT 7.0  --   --   --   --   ALBUMIN 2.1* 1.9* 1.7* 1.5* 1.5*  Coagulation profile  Recent Labs Lab 09/09/15 0338  INR 1.26    CBC:  Recent Labs Lab 09/09/15 0338 09/09/15 1416 09/10/15 0349 09/11/15 0235 09/12/15 0721  WBC 14.0*  --  16.3* 19.2* 17.1*  NEUTROABS 12.2* 13.2*  --   --  13.7*  HGB 8.8*  --  8.9* 8.1* 7.8*  HCT 27.0*  --  27.6* 23.9* 24.4*  MCV 92.5  --  93.9 94.8 95.3  PLT 274  --  253 188 210   Cardiac Enzymes:  Recent Labs Lab 09/09/15 0338 09/09/15 0958 09/09/15 1416  CKTOTAL  --   --  142  TROPONINI 0.03 0.03 0.06*   CBG:  Recent Labs Lab 09/12/15 0625 09/12/15 1156 09/12/15 1706 09/12/15 2208 09/13/15 0645  GLUCAP 80 78 78 72 92   Microbiology Recent Results (from the past 240 hour(s))  MRSA PCR Screening     Status: None   Collection Time: 09/09/15  2:29 AM  Result Value Ref Range Status   MRSA by PCR NEGATIVE NEGATIVE Final    Comment:        The GeneXpert MRSA Assay (FDA approved for NASAL specimens only), is one component of a comprehensive MRSA colonization surveillance program. It is not intended to diagnose MRSA infection nor to guide or monitor treatment for MRSA infections.   Culture, blood (routine x 2)     Status: None (Preliminary result)   Collection Time:  09/09/15  3:38 AM  Result Value Ref Range Status   Specimen Description BLOOD LEFT ARM  Final   Special Requests BOTTLES DRAWN AEROBIC AND ANAEROBIC 8ML  Final   Culture NO GROWTH 3 DAYS  Final   Report Status PENDING  Incomplete  Culture, blood (routine x 2)     Status: None (Preliminary result)   Collection Time: 09/09/15  3:50 AM  Result Value Ref Range Status   Specimen Description BLOOD LEFT HAND  Final   Special Requests BOTTLES DRAWN AEROBIC ONLY 7ML  Final   Culture NO GROWTH 3 DAYS  Final   Report Status PENDING  Incomplete  Urine culture     Status: None   Collection Time: 09/09/15  3:08 PM  Result Value Ref Range Status   Specimen Description URINE, RANDOM  Final   Special Requests NONE  Final   Culture   Final    >=100,000 COLONIES/mL ENTEROBACTER AEROGENES >=100,000 COLONIES/mL GROUP B STREP(S.AGALACTIAE)ISOLATED TESTING AGAINST S. AGALACTIAE NOT ROUTINELY PERFORMED DUE TO PREDICTABILITY OF AMP/PEN/VAN SUSCEPTIBILITY.    Report Status 09/11/2015 FINAL  Final   Organism ID, Bacteria ENTEROBACTER AEROGENES  Final      Susceptibility   Enterobacter aerogenes - MIC*    CEFAZOLIN <=4 RESISTANT Resistant     CEFTRIAXONE <=1 SENSITIVE Sensitive     CIPROFLOXACIN <=0.25 SENSITIVE Sensitive     GENTAMICIN <=1 SENSITIVE Sensitive     IMIPENEM <=0.25 SENSITIVE Sensitive     NITROFURANTOIN 64 INTERMEDIATE Intermediate     TRIMETH/SULFA <=20 SENSITIVE Sensitive     PIP/TAZO 16 SENSITIVE Sensitive     * >=100,000 COLONIES/mL ENTEROBACTER AEROGENES     Medications:   . aspirin EC  81 mg Oral Daily  . cefTRIAXone (ROCEPHIN)  IV  1 g Intravenous Q24H  . FLUoxetine  20 mg Oral BID  . heparin  5,000 Units Subcutaneous 3 times per day  . insulin aspart  0-15 Units Subcutaneous TID WC  . insulin glargine  10 Units Subcutaneous QHS  . lanthanum  1,000 mg Oral  TID WC  . saccharomyces boulardii  250 mg Oral BID  . sodium chloride flush  3 mL Intravenous Q12H  . sodium chloride  flush  3 mL Intravenous Q12H   Continuous Infusions: . sodium chloride 100 mL/hr at 09/13/15 0517    Time spent: 25 minutes.    LOS: 5 days   RAMA,CHRISTINA  [facility] Pager [phone]. If unable to reach me by pager, please call my cell phone at [phone].  *Please refer to [email or web address], password [email or web address] to get updated schedule on who will round on this patient, as hospitalists switch teams weekly. If 7PM-7AM, please contact night-coverage at [email or web address], password [email or web address] for any overnight needs.  09/13/2015, 8:11 AM

## 2015-09-14 LAB — RENAL FUNCTION PANEL
ALBUMIN: 1.4 g/dL — AB (ref 3.5–5.0)
ANION GAP: 11 (ref 5–15)
BUN: 26 mg/dL — ABNORMAL HIGH (ref 6–20)
CALCIUM: 7.8 mg/dL — AB (ref 8.9–10.3)
CO2: 23 mmol/L (ref 22–32)
Chloride: 107 mmol/L (ref 101–111)
Creatinine, Ser: 3.65 mg/dL — ABNORMAL HIGH (ref 0.44–1.00)
GFR calc non Af Amer: 13 mL/min — ABNORMAL LOW (ref >60)
GFR, EST AFRICAN AMERICAN: 15 mL/min — AB (ref >60)
Glucose, Bld: 154 mg/dL — ABNORMAL HIGH (ref 65–99)
PHOSPHORUS: 3.3 mg/dL (ref 2.5–4.6)
Potassium: 3.7 mmol/L (ref 3.5–5.1)
SODIUM: 141 mmol/L (ref 135–145)

## 2015-09-14 LAB — GLUCOSE, CAPILLARY
GLUCOSE-CAPILLARY: 95 mg/dL (ref 65–99)
Glucose-Capillary: 130 mg/dL — ABNORMAL HIGH (ref 65–99)
Glucose-Capillary: 137 mg/dL — ABNORMAL HIGH (ref 65–99)
Glucose-Capillary: 194 mg/dL — ABNORMAL HIGH (ref 65–99)

## 2015-09-14 LAB — CULTURE, BLOOD (ROUTINE X 2)
CULTURE: NO GROWTH
Culture: NO GROWTH

## 2015-09-14 MED ORDER — DIPHENHYDRAMINE HCL 25 MG PO CAPS
25.0000 mg | ORAL_CAPSULE | Freq: Three times a day (TID) | ORAL | Status: AC | PRN
Start: 2015-09-14 — End: 2015-09-16
  Administered 2015-09-14 – 2015-09-16 (×3): 25 mg via ORAL
  Filled 2015-09-14 (×3): qty 1

## 2015-09-14 NOTE — Progress Notes (Signed)
Physical Therapy Treatment Patient Details Name: [name] MRN: [id number] DOB: May 30, 1956 Today's Date: 09/14/2015    History of Present Illness Sophia EllisonGenevieve A [name] is an 60 y.o. female with a PMH of hypertension and insulin-dependent diabetes [facility] evaluation of acute renal failure and associated hyperkalemia. Patient was recently hospitalized 08/07/15-08/15/15 for treatment of sepsis secondary to necrotizing fasciitis. She was discharged home on clindamycin. A follow-up check of her creatinine on 09/05/15 revealed marked elevation of 15.7    PT Comments    Patient seen for mobility progression, assisted with redressing of wound prior to activity. Patient then initiated ambulation but was limited by BM, assisted patient with pericare and hygiene, patient then attempted further ambulation but again required bathroom needs and self care. Patient then too fatigued and nauseated to continue, assisted back to bed.    Follow Up Recommendations  Home health PT;Supervision - Intermittent     Equipment Recommendations  3in1 (PT)    Recommendations for Other Services       Precautions / Restrictions Precautions Precautions: Fall Precaution Comments: R thigh and sacral wound    Mobility  Bed Mobility Overal bed mobility: Needs Assistance Bed Mobility: Supine to Sit     Supine to sit: Min guard     General bed mobility comments: assist for scooting  Transfers Overall transfer level: Needs assistance Equipment used: Rolling walker (2 wheeled) Transfers: Sit to/from Stand Sit to Stand: Min guard         General transfer comment: performed off the toiletx2 and off bed x2 and one time off bedside commode  Ambulation/Gait Ambulation/Gait assistance: Min guard Ambulation Distance (Feet): 40 Feet Assistive device: Rolling walker (2 wheeled) Gait Pattern/deviations: Step-through pattern;Decreased stride length;Trunk flexed      General Gait Details: slow pace, slight flexed posture   Stairs            Wheelchair Mobility    Modified Rankin (Stroke Patients Only)       Balance             Standing balance-Leahy Scale: Fair                      Cognition Arousal/Alertness: Awake/alert Behavior During Therapy: WFL for tasks assessed/performed Overall Cognitive Status: Within Functional Limits for tasks assessed                      Exercises      General Comments General comments (skin integrity, edema, etc.): assist provided to redress wound on anterior aspect R thigh covered. Patient with multiple BMs during session requiring assist for hygiene and pericare.      Pertinent Vitals/Pain Pain Assessment: Faces Faces Pain Scale: Hurts even more Pain Location: buttocks and sacral wound Pain Descriptors / Indicators: Sore;Burning Pain Intervention(s): Limited activity within patient's tolerance;Monitored during session;Repositioned    Home Living                      Prior Function            PT Goals (current goals can now be found in the care plan section) Acute Rehab PT Goals Patient Stated Goal: To go home PT Goal Formulation: With patient Time For Goal Achievement: 09/20/15 Potential to Achieve Goals: Good Progress towards PT goals: Progressing toward goals    Frequency  Min 3X/week    PT Plan Current plan remains appropriate    Co-evaluation  End of Session Equipment Utilized During Treatment: Gait belt Activity Tolerance: Patient tolerated treatment well Patient left: in bed;with call bell/phone within reach;with family/visitor present     Time: 1610-9604 PT Time Calculation (min) (ACUTE ONLY): 28 min  Charges:  $Therapeutic Activity: 8-22 mins $Self Care/Home Management: 8-22                    G CodesFabio Asa Sep 29, 2015, 6:09 PM  Charlotte Crumb, PT DPT  [phone]

## 2015-09-14 NOTE — Procedures (Signed)
Pt seen on HD. Ap 80 VP 50.  BFR 250.  K 3.7, will switch to 4K bath.  UO marginal.

## 2015-09-14 NOTE — Progress Notes (Signed)
Progress Note   [name] ZOX:[id number] DOB: 1955/11/23 DOA: 09/08/2015 PCP: Sophia BostonAPPER,[name], MD   Brief Narrative:   Sophia EllisonGenevieve A [name] is an 60 y.o. female with a PMH of hypertension and insulin-dependent diabetes was [facility] for evaluation of acute renal failure and associated hyperkalemia. Patient was recently hospitalized 08/07/15-08/15/15 for treatment of sepsis secondary to necrotizing fasciitis. She was discharged home on clindamycin. A follow-up check of her creatinine on 09/05/15 revealed marked elevation of 15.7 (1.16 at discharge).  Assessment/Plan:   Principal Problem:   Acute renal failure (ARF) (HCC) Admission creatinine 18.71. Creatinine last noted to be 1.16 at her prior hospital discharge. Nephrology consultation performed 09/09/15 and the patient was subsequently taken for hemodialysis.  Urinalysis negative for casts. Renal ultrasound negative for hydronephrosis. Etiology presumed to be ATN. Prognosis for recovery of renal function felt to be good. Continue HD support until renal function recovers.  Active Problems:   Enterobacter and group [name] strep UTI Unclear if this was colonization versus a true UTI. Afebrile but WBC elevated. Rocephin started 09/12/15.    Necrotizing fasciitis (HCC) of inner thigh Patient has a large open wound on her medial right thigh after debridement last month. She has been receiving wound care through home health services. She completed a course of clindamycin. One set of blood cultures was drawn on admission. Wound care nurse evaluated patient 09/09/15 and placed dressing change orders. Wound seen by surgery, recommends ongoing W--->D dressing changes, F/U with Dr. Sheliah HatchKinsinger which was arranged.      IDDM (insulin dependent diabetes mellitus) (HCC) Home medications on hold including [place]Farxiga. Hemoglobin A1c 10% on 09/05/15 indicating poor outpatient glycemic control. Currently being managed with 5 units of Lantus  and moderate scale SSI 3 times a day. CBG 95-261. May need to titrate insulin up as renal recovery ensues.    History of hypertension Metoprolol on hold. Blood pressure currently controlled off metoprolol.    Hyperkalemia Status post treatment with calcium, dextrose, insulin, and Kayexalate. Potassium WNL.    Hyperphosphatemia Phosphorus normalized with hemodialysis and treatment with Fosrenol.    Hypermagnesemia Secondary to renal failure. Resolved with HD.    Generalized exfoliative dermatitis This appears to be new since her recent hospital discharge, and is concerning for a possible adverse reaction.    Normocytic anemia Consistent with usual baseline values. Likely anemia of chronic disease. Ferriheme given.    DVT Prophylaxis Subcutaneous heparin ordered.   Family Communication/Anticipated D/C date and plan/Code Status   Family Communication: No family currently at the bedside.Told patient to have the nurse page me when her family arrives. Disposition Plan/date: Home when renal function stable. PT recommends discharge home with intermittent supervision, home health PT and a 3 in 1. Code Status: Full code.   Procedures and diagnostic studies:   [place] Renal  09/09/2015  CLINICAL DATA:  Acute onset of renal insufficiency. Initial encounter. EXAM: RENAL / URINARY TRACT ULTRASOUND COMPLETE COMPARISON:  CT of the pelvis performed 08/07/2015 FINDINGS: Right Kidney: Length: 11.3 cm. Echogenicity within normal limits. No mass or hydronephrosis visualized. Left Kidney: Length: 11.3 cm. Echogenicity within normal limits. No mass or hydronephrosis visualized. Bladder: Appears normal for degree of bladder distention. IMPRESSION: Unremarkable renal ultrasound. Electronically Signed   By: Roanna RaiderJeffery  Chang M.D.   On: 09/09/2015 05:38   Dg Chest Port 1 View  09/09/2015  CLINICAL DATA:  Hypertension, diabetes, acute renal failure EXAM: PORTABLE CHEST 1 VIEW COMPARISON:  08/10/2015 FINDINGS: Right  IJ temporary dialysis catheter tip at the level of the mid SVC. Exam is slightly rotated to the left. Low lung volumes evident. Heart is enlarged with central vascular congestion. Minor basilar atelectasis. No effusion or pneumothorax. IMPRESSION: Right IJ temporary dialysis catheter tip mid SVC level. Cardiomegaly with vascular congestion Low lung volumes and basilar atelectasis Electronically Signed   By: Judie Petit.  Shick M.D.   On: 09/09/2015 14:46    Medical Consultants:    Nephrology: Dr. Otho Bellows  Anti-Infectives:   Anti-infectives    Start     Dose/Rate Route Frequency Ordered Stop   09/12/15 1000  cefTRIAXone (ROCEPHIN) 1 g in dextrose 5 % 50 mL IVPB     1 g 100 mL/hr over 30 Minutes Intravenous Every 24 hours 09/12/15 0905        Subjective:   [name] denies nausea.  AppetiteOkay. No vomiting.  Had a loose stool today. Denies pain. Wants to go home.   Objective:    Filed Vitals:   09/14/15 0930 09/14/15 1000 09/14/15 1030 09/14/15 1046  BP: 114/64 94/59 99/62  104/61  Pulse: 113 110 111 114  Temp:    98.3 F (36.8 C)  TempSrc:    Oral  Resp: Height:      Weight:    98.9 kg (218 lb 0.6 oz)  SpO2:        Intake/Output Summary (Last 24 hours) at 09/14/15 1419 Last data filed at 09/14/15 1349  Gross per 24 hour  Intake 1061.67 ml  Output    250 ml  Net 811.67 ml   Filed Weights   09/14/15 0621 09/14/15 0746 09/14/15 1046  Weight: 98.884 kg (218 lb) 98.9 kg (218 lb 0.6 oz) 98.9 kg (218 lb 0.6 oz)    Exam: Gen: Alert, NAD Cardiovascular:  Tachycardic, No M/R/G Respiratory:  Diminished but clear Gastrointestinal:  Abdomen soft, NT/ND, + BS Extremities:  Right thigh dressing intact  Data Reviewed:    Labs: Basic Metabolic Panel:  Recent Labs Lab 09/09/15 0338  09/09/15 1416 09/10/15 0349 09/11/15 0235 09/12/15 0532 09/13/15 0500 09/14/15 0357  NA 138  < >  --  139 139 142 143 141  K 5.2*  < >  --  4.6 4.4 4.4 3.9 3.7  CL  98*  < >  --  102 105 108 105 107  CO2 20*  < >  --  21* 21* 21* 26 23  GLUCOSE 221*  < >  --  137* 140* 88 94 154*  BUN 114*  < >  --  61* 36* 42* 21* 26*  CREATININE 18.71*  < >  --  10.80* 6.77* 5.93* 3.52* 3.65*  CALCIUM 9.4  < >  --  7.7* 7.8* 8.4* 8.0* 7.8*  MG 3.5*  --  3.6*  --  2.2  --   --   --   PHOS 17.1*  --   --  9.6* 6.0* 5.4* 4.0 3.3  < > = values in this interval not displayed. GFR Estimated Creatinine Clearance: 18 mL/min (by C-G formula based on Cr of 3.65). Liver Function Tests:  Recent Labs Lab 09/09/15 0338 09/10/15 0349 09/11/15 0235 09/12/15 0532 09/13/15 0500 09/14/15 0357  AST 12*  --   --   --   --   --   ALT 17  --   --   --   --   --   ALKPHOS 127*  --   --   --   --   --  BILITOT 0.7  --   --   --   --   --   PROT 7.0  --   --   --   --   --   ALBUMIN 2.1* 1.9* 1.7* 1.5* 1.5* 1.4*   Coagulation profile  Recent Labs Lab 09/09/15 0338  INR 1.26    CBC:  Recent Labs Lab 09/09/15 0338 09/09/15 1416 09/10/15 0349 09/11/15 0235 09/12/15 0721  WBC 14.0*  --  16.3* 19.2* 17.1*  NEUTROABS 12.2* 13.2*  --   --  13.7*  HGB 8.8*  --  8.9* 8.1* 7.8*  HCT 27.0*  --  27.6* 23.9* 24.4*  MCV 92.5  --  93.9 94.8 95.3  PLT 274  --  253 188 210   Cardiac Enzymes:  Recent Labs Lab 09/09/15 0338 09/09/15 0958 09/09/15 1416  CKTOTAL  --   --  142  TROPONINI 0.03 0.03 0.06*   CBG:  Recent Labs Lab 09/13/15 1157 09/13/15 1642 09/13/15 2112 09/14/15 0558 09/14/15 1124  GLUCAP 162* 261* 141* 130* 95   Microbiology Recent Results (from the past 240 hour(s))  MRSA PCR Screening     Status: None   Collection Time: 09/09/15  2:29 AM  Result Value Ref Range Status   MRSA by PCR NEGATIVE NEGATIVE Final    Comment:        The GeneXpert MRSA Assay (FDA approved for NASAL specimens only), is one component of a comprehensive MRSA colonization surveillance program. It is not intended to diagnose MRSA infection nor to guide or monitor  treatment for MRSA infections.   Culture, blood (routine x 2)     Status: None   Collection Time: 09/09/15  3:38 AM  Result Value Ref Range Status   Specimen Description BLOOD LEFT ARM  Final   Special Requests BOTTLES DRAWN AEROBIC AND ANAEROBIC  Final   Culture NO GROWTH 5 DAYS  Final   Report Status 09/14/2015 FINAL  Final  Culture, blood (routine x 2)     Status: None   Collection Time: 09/09/15  3:50 AM  Result Value Ref Range Status   Specimen Description BLOOD LEFT HAND  Final   Special Requests BOTTLES DRAWN AEROBIC ONLY  Final   Culture NO GROWTH 5 DAYS  Final   Report Status 09/14/2015 FINAL  Final  Urine culture     Status: None   Collection Time: 09/09/15  3:08 PM  Result Value Ref Range Status   Specimen Description URINE, RANDOM  Final   Special Requests NONE  Final   Culture   Final    >=100,000 COLONIES/mL ENTEROBACTER AEROGENES >=100,000 COLONIES/mL GROUP [name] STREP(S.AGALACTIAE)ISOLATED TESTING AGAINST S. AGALACTIAE NOT ROUTINELY PERFORMED DUE TO PREDICTABILITY OF AMP/PEN/VAN SUSCEPTIBILITY.    Report Status 09/11/2015 FINAL  Final   Organism ID, Bacteria ENTEROBACTER AEROGENES  Final      Susceptibility   Enterobacter aerogenes - MIC*    CEFAZOLIN <=4 RESISTANT Resistant     CEFTRIAXONE <=1 SENSITIVE Sensitive     CIPROFLOXACIN <=0.25 SENSITIVE Sensitive     GENTAMICIN <=1 SENSITIVE Sensitive     IMIPENEM <=0.25 SENSITIVE Sensitive     NITROFURANTOIN 64 INTERMEDIATE Intermediate     TRIMETH/SULFA <=20 SENSITIVE Sensitive     PIP/TAZO 16 SENSITIVE Sensitive     * >=100,000 COLONIES/mL ENTEROBACTER AEROGENES     Medications:   . aspirin EC  81 mg Oral Daily  . cefTRIAXone (ROCEPHIN)  IV  1 g Intravenous Q24H  .  FLUoxetine  20 mg Oral BID  . heparin  5,000 Units Subcutaneous 3 times per day  . insulin aspart  0-15 Units Subcutaneous TID WC  . insulin glargine  5 Units Subcutaneous QHS  . lanthanum  1,000 mg Oral TID WC  . saccharomyces  boulardii  250 mg Oral BID  . sodium chloride flush  3 mL Intravenous Q12H  . sodium chloride flush  3 mL Intravenous Q12H   Continuous Infusions: . sodium chloride 50 mL/hr at 09/14/15 1106    Time spent: 25 minutes.    LOS: 6 days   Rochester Serpe  [facility] Pager [phone]. If unable to reach me by pager, please call my cell phone at [phone].  *Please refer to [email or web address], password [email or web address] to get updated schedule on who will round on this patient, as hospitalists switch teams weekly. If 7PM-7AM, please contact night-coverage at [email or web address], password [email or web address] for any overnight needs.  09/14/2015, 2:19 PM

## 2015-09-14 NOTE — Progress Notes (Signed)
Inpatient Diabetes Program Recommendations  AACE/ADA: New Consensus Statement on Inpatient Glycemic Control (2015)  Target Ranges:  Prepandial:   less than 140 mg/dL      Peak postprandial:   less than 180 mg/dL (1-2 hours)      Critically ill patients:  140 - 180 mg/dL   Review of Glycemic Control  Results for [name], [name] (MRN [id number]) as of 09/14/2015 14:05  Ref. Range 09/13/2015 11:57 09/13/2015 16:42 09/13/2015 21:12 09/14/2015 05:58 09/14/2015 11:24  Glucose-Capillary Latest Ref Range: 65-99 mg/dL 409162 (H) 811261 (H) 914141 (H) 130 (H) 95    Diabetes history: Type 2 Outpatient Diabetes medications: Lantus 18 unitas qhs, Novolog 3 units tid with meals, Novolog correction tid, Farxiga 10mg /day Current orders for Inpatient glycemic control: Lantus 5 units qhs, Novolog 0-15 units tid with meals.  Inpatient Diabetes Program Recommendations:  Agree with current orders for diabetes medication management.   Susette RacerJulie Alexes Menchaca, RN, BA, MHA, CDE Diabetes Coordinator Inpatient Diabetes Program  [phone] (Team Pager) [phone] [facility] Office) 09/14/2015 2:07 PM

## 2015-09-15 LAB — RENAL FUNCTION PANEL
Albumin: 1.4 g/dL — ABNORMAL LOW (ref 3.5–5.0)
Anion gap: 11 (ref 5–15)
BUN: 15 mg/dL (ref 6–20)
CHLORIDE: 102 mmol/L (ref 101–111)
CO2: 25 mmol/L (ref 22–32)
Calcium: 7.5 mg/dL — ABNORMAL LOW (ref 8.9–10.3)
Creatinine, Ser: 2.98 mg/dL — ABNORMAL HIGH (ref 0.44–1.00)
GFR, EST AFRICAN AMERICAN: 19 mL/min — AB (ref >60)
GFR, EST NON AFRICAN AMERICAN: 16 mL/min — AB (ref >60)
Glucose, Bld: 180 mg/dL — ABNORMAL HIGH (ref 65–99)
POTASSIUM: 4.5 mmol/L (ref 3.5–5.1)
Phosphorus: 3.4 mg/dL (ref 2.5–4.6)
SODIUM: 138 mmol/L (ref 135–145)

## 2015-09-15 LAB — GLUCOSE, CAPILLARY
Glucose-Capillary: 158 mg/dL — ABNORMAL HIGH (ref 65–99)
Glucose-Capillary: 203 mg/dL — ABNORMAL HIGH (ref 65–99)
Glucose-Capillary: 178 mg/dL — ABNORMAL HIGH (ref 65–99)
Glucose-Capillary: 138 mg/dL — ABNORMAL HIGH (ref 65–99)

## 2015-09-15 MED ORDER — LORAZEPAM 0.5 MG PO TABS
0.5000 mg | ORAL_TABLET | Freq: Once | ORAL | Status: AC
  Administered 2015-09-15: 0.5 mg via ORAL
  Filled 2015-09-15: qty 1

## 2015-09-15 NOTE — Progress Notes (Signed)
S: Working with PT O:BP 100/50 mmHg  Pulse 98  Temp(Src) 99.3 F (37.4 C) (Oral)  Resp 16  Ht 5\' 3"  (1.6 m)  Wt 99.474 kg (219 lb 4.8 oz)  BMI 38.86 kg/m2  SpO2 100%  Intake/Output Summary (Last 24 hours) at 09/15/15 0935 Last data filed at 09/15/15 0426  Gross per 24 hour  Intake   1800 ml  Output      0 ml  Net   1800 ml   Weight change: 0.016 kg (0.6 oz) ZOX:WRUEAGen:awake and alert CVS: Tachycardic on my exam Resp:clear Abd:+ BS NTND Ext: large wound inner Rt thigh NEURO:CNI Ox3 no asterixis Rt IJ temp cath   . aspirin EC  81 mg Oral Daily  . cefTRIAXone (ROCEPHIN)  IV  1 g Intravenous Q24H  . FLUoxetine  20 mg Oral BID  . heparin  5,000 Units Subcutaneous 3 times per day  . insulin aspart  0-15 Units Subcutaneous TID WC  . insulin glargine  5 Units Subcutaneous QHS  . lanthanum  1,000 mg Oral TID WC  . saccharomyces boulardii  250 mg Oral BID  . sodium chloride flush  3 mL Intravenous Q12H  . sodium chloride flush  3 mL Intravenous Q12H   No results found. BMET    Component Value Date/Time   NA 138 09/15/2015 0445   K 4.5 09/15/2015 0445   CL 102 09/15/2015 0445   CO2 25 09/15/2015 0445   GLUCOSE 180* 09/15/2015 0445   BUN 15 09/15/2015 0445   CREATININE 2.98* 09/15/2015 0445   CALCIUM 7.5* 09/15/2015 0445   GFRNONAA 16* 09/15/2015 0445   GFRAA 19* 09/15/2015 0445   CBC    Component Value Date/Time   WBC 17.1* 09/12/2015 0721   RBC 2.56* 09/12/2015 0721   HGB 7.8* 09/12/2015 0721   HCT 24.4* 09/12/2015 0721   PLT 210 09/12/2015 0721   MCV 95.3 09/12/2015 0721   MCH 30.5 09/12/2015 0721   MCHC 32.0 09/12/2015 0721   RDW 15.2 09/12/2015 0721   LYMPHSABS 1.1 09/12/2015 0721   MONOABS 0.7 09/12/2015 0721   EOSABS 1.5* 09/12/2015 0721   BASOSABS 0.1 09/12/2015 0721     Assessment: 1. ARF presumably due to ATN, on HD,  UO not recorded 2. Enterobacter UTI, on ceftriaxone 3 DM 4. Fasciitis Rt thigh 5. Anemia, Fe def Received 1 dose  feraheme  Plan: 1.Plan HD tomorrow 2. Put on telemetry 3. Need to keep accurate I/O's 4. DC IV fluids 5. Could switch to PO AB  Jerzy Crotteau T

## 2015-09-15 NOTE — Progress Notes (Signed)
PT Cancellation Note  Patient Details Name: Sophia EllisonGenevieve A [name] MRN: [id number] DOB: 14-Jul-1955   Cancelled Treatment:    Reason Eval/Treat Not Completed: Patient at procedure or test/unavailable MD requests pt be monitored with telemetry due to tachycardia. RN to room to connect. Will follow up later when pt ready.  Berton MountBarbour, Jaklyn Alen S 09/15/2015, 10:36 AM  Charlsie MerlesLogan Secor Tiye Huwe, PT [phone]

## 2015-09-15 NOTE — Progress Notes (Signed)
Physical Therapy Treatment Patient Details Name: Barrington EllisonGenevieve A Tovey MRN: [id number] DOB: 07-Jul-1955 Today's Date: 09/15/2015    History of Present Illness Barrington EllisonGenevieve A Sliva is an 60 y.o. female with a PMH of hypertension and insulin-dependent diabetes was [facility] evaluation of acute renal failure and associated hyperkalemia. Patient was recently hospitalized 08/07/15-08/15/15 for treatment of sepsis secondary to necrotizing fasciitis. She was discharged home on clindamycin. A follow-up check of her creatinine on 09/05/15 revealed marked elevation of 15.7    PT Comments    Limited due to rapid increase in HR (140s) with short distance ambulation (although asymptomatic and denies any chest pain.) Resting HR was 107. RN notified. Tolerated gentle exercises in bed well. Will continue to progress as tolerated. Patient will continue to benefit from skilled physical therapy services to further improve independence with functional mobility.   Follow Up Recommendations  Home health PT;Supervision - Intermittent     Equipment Recommendations  3in1 (PT)    Recommendations for Other Services       Precautions / Restrictions Precautions Precautions: Fall Precaution Comments: R thigh and sacral wound Restrictions Weight Bearing Restrictions: No    Mobility  Bed Mobility Overal bed mobility: Needs Assistance Bed Mobility: Supine to Sit     Supine to sit: Supervision     General bed mobility comments: Supervision for safety. Required extra time and use of rail.   Transfers Overall transfer level: Needs assistance Equipment used: Rolling walker (2 wheeled) Transfers: Sit to/from Stand Sit to Stand: Supervision         General transfer comment: Supervision for safety. Good power-up to stand from low bed setting. VC for hand placement.  Ambulation/Gait Ambulation/Gait assistance: Supervision Ambulation Distance (Feet): 80 Feet Assistive device:  Rolling walker (2 wheeled) Gait Pattern/deviations: Step-through pattern;Decreased stride length;Trunk flexed Gait velocity: decreased Gait velocity interpretation: Below normal speed for age/gender General Gait Details: Decreased pace, intermittent cues for larger steps and forward gaze. Good control of RW. No overt loss of balance noted. Distance limited due to elevated HR up to 140 (asymptomatic.)   Stairs Stairs:  (Held due to elevated HR)          Wheelchair Mobility    Modified Rankin (Stroke Patients Only)       Balance Overall balance assessment: Needs assistance Sitting-balance support: No upper extremity supported;Feet supported Sitting balance-Leahy Scale: Fair     Standing balance support: No upper extremity supported Standing balance-Leahy Scale: Fair                      Cognition Arousal/Alertness: Awake/alert Behavior During Therapy: WFL for tasks assessed/performed Overall Cognitive Status: Within Functional Limits for tasks assessed                      Exercises General Exercises - Lower Extremity Ankle Circles/Pumps: AROM;Both;10 reps;Seated Quad Sets: Strengthening;Both;15 reps;Seated Long Arc Quad: Strengthening;Both;10 reps;Seated Straight Leg Raises: Strengthening;Both;10 reps;Supine Toe Raises: Strengthening;Both;10 reps;Seated    General Comments General comments (skin integrity, edema, etc.): HR 107 at rest, quickly elevated to 140 with short distance ambulation. Once returned to bed HR dropped to 110s, but after resting for a while rose to 150s. RN notified and aware. NAD. Denies chest pain or SOB.      Pertinent Vitals/Pain Pain Assessment: Faces Faces Pain Scale: Hurts little more Pain Location: buttocks Pain Descriptors / Indicators: Burning Pain Intervention(s): Monitored during session;Repositioned    Home Living  Prior Function            PT Goals (current goals can now be  found in the care plan section) Acute Rehab PT Goals Patient Stated Goal: To go home PT Goal Formulation: With patient Time For Goal Achievement: 09/20/15 Potential to Achieve Goals: Good Progress towards PT goals: Progressing toward goals    Frequency  Min 3X/week    PT Plan Current plan remains appropriate    Co-evaluation             End of Session Equipment Utilized During Treatment: Gait belt Activity Tolerance: Treatment limited secondary to medical complications (Comment) (Stopped due to elevated HR.) Patient left: with call bell/phone within reach;in bed;with bed alarm set     Time: 0981-1914 PT Time Calculation (min) (ACUTE ONLY): 10 min  Charges:  $Gait Training: 8-22 mins                    G Codes:      Berton Mount 10-Oct-2015, 11:38 AM  Charlsie Merles, PT [phone]

## 2015-09-15 NOTE — Progress Notes (Signed)
Progress Note   Sophia EllisonGenevieve A [name] WUJ:[id number] DOB: 05-29-1956 DOA: 09/08/2015 PCP: Louie BostonAPPER,[name], MD   Brief Narrative:   [name] is an 60 y.o. female with a PMH of hypertension and insulin-dependent diabetes was transferred [facility] for evaluation of acute renal failure and associated hyperkalemia. Patient was recently hospitalized 08/07/15-08/15/15 for treatment of sepsis secondary to necrotizing fasciitis. She was discharged home on clindamycin. A follow-up check of her creatinine on 09/05/15 revealed marked elevation of 15.7 (1.16 at discharge).  Assessment/Plan:   Principal Problem:   Acute renal failure (ARF) (HCC) Admission creatinine 18.71. Creatinine last noted to be 1.16 at her prior hospital discharge. Nephrology consultation performed 09/09/15 and the patient was subsequently taken for hemodialysis.  Urinalysis negative for casts. Renal ultrasound negative for hydronephrosis. Etiology presumed to be ATN. Prognosis for recovery of renal function felt to be good. Continue HD support until renal function recovers.  Active Problems:   Enterobacter and group B strep UTI Unclear if this was colonization versus a true UTI. Afebrile but WBC elevated. Rocephin started 09/12/15.We'll discontinue antibiotics, treatment course should be adequate.    Necrotizing fasciitis (HCC) of inner thigh Patient has a large open wound on her medial right thigh after debridement last month. She has been receiving wound care through home health services. She completed a course of clindamycin. One set of blood cultures was drawn on admission. Wound care nurse evaluated patient 09/09/15 and placed dressing change orders. Wound seen by surgery, recommends ongoing W--->D dressing changes, F/U with Dr. Sheliah HatchKinsinger which was arranged.      IDDM (insulin dependent diabetes mellitus) (HCC) Home medications on hold including [place]xiga. Hemoglobin A1c 10% on 09/05/15 indicating poor  outpatient glycemic control. Currently being managed with 5 units of Lantus and moderate scale SSI 3 times a day. CBG 95-261. May need to titrate insulin up as renal recovery ensues.    History of hypertension Metoprolol on hold. Blood pressure currently controlled off metoprolol.    Hyperkalemia Status post treatment with calcium, dextrose, insulin, and Kayexalate. Potassium WNL.    Hyperphosphatemia Phosphorus normalized with hemodialysis and treatment with Fosrenol.    Hypermagnesemia Secondary to renal failure. Resolved with HD.    Generalized exfoliative dermatitis This appears to be new since her recent hospital discharge, and is concerning for a possible adverse drug reaction to clindamycin.    Normocytic anemia Consistent with usual baseline values. Likely anemia of chronic disease. Ferriheme given.    DVT Prophylaxis Subcutaneous heparin ordered.   Family Communication/Anticipated D/C date and plan/Code Status   Family Communication: No family currently at the bedside.Told patient to have the nurse page me when her family arrives. Disposition Plan/date: Home when renal function stable. PT recommends discharge home with intermittent supervision, home health PT and a 3 in 1. Code Status: Full code.   Procedures and diagnostic studies:   [place] Renal  09/09/2015  CLINICAL DATA:  Acute onset of renal insufficiency. Initial encounter. EXAM: RENAL / URINARY TRACT ULTRASOUND COMPLETE COMPARISON:  CT of the pelvis performed 08/07/2015 FINDINGS: Right Kidney: Length: 11.3 cm. Echogenicity within normal limits. No mass or hydronephrosis visualized. Left Kidney: Length: 11.3 cm. Echogenicity within normal limits. No mass or hydronephrosis visualized. Bladder: Appears normal for degree of bladder distention. IMPRESSION: Unremarkable renal ultrasound. Electronically Signed   By: Roanna RaiderJeffery  Chang M.D.   On: 09/09/2015 05:38   Dg Chest Port 1 View  09/09/2015  CLINICAL DATA:  Hypertension,  diabetes, acute renal failure  EXAM: PORTABLE CHEST 1 VIEW COMPARISON:  08/10/2015 FINDINGS: Right IJ temporary dialysis catheter tip at the level of the mid SVC. Exam is slightly rotated to the left. Low lung volumes evident. Heart is enlarged with central vascular congestion. Minor basilar atelectasis. No effusion or pneumothorax. IMPRESSION: Right IJ temporary dialysis catheter tip mid SVC level. Cardiomegaly with vascular congestion Low lung volumes and basilar atelectasis Electronically Signed   By: Judie Petit.  Shick M.D.   On: 09/09/2015 14:46    Medical Consultants:    Nephrology: Dr. Otho Bellows  Anti-Infectives:   Rocephin 09/12/15---> 09/15/15  Subjective:   [name] denies nausea.  Appetite remains good. No vomiting. Denies pain. Wants to go home.   Objective:    Filed Vitals:   09/14/15 1030 09/14/15 1046 09/14/15 2100 09/15/15 0340  BP: 99/62 104/61 114/65 100/50  Pulse: 111 114 88 98  Temp:  98.3 F (36.8 C) 98 F (36.7 C) 99.3 F (37.4 C)  TempSrc:  Oral Oral Oral  Resp: 17 16 18 16   Height:    5\' 3"  (1.6 m)  Weight:  98.9 kg (218 lb 0.6 oz)  99.474 kg (219 lb 4.8 oz)  SpO2:   98% 100%    Intake/Output Summary (Last 24 hours) at 09/15/15 1024 Last data filed at 09/15/15 0426  Gross per 24 hour  Intake   1800 ml  Output      0 ml  Net   1800 ml   Filed Weights   09/14/15 0746 09/14/15 1046 09/15/15 0340  Weight: 98.9 kg (218 lb 0.6 oz) 98.9 kg (218 lb 0.6 oz) 99.474 kg (219 lb 4.8 oz)    Exam: Gen: Alert, NAD Cardiovascular:  Tachycardic, No M/R/G Respiratory:  Diminished but clear Gastrointestinal:  Abdomen soft, NT/ND, + BS Extremities:  Right thigh dressing intact  Data Reviewed:    Labs: Basic Metabolic Panel:  Recent Labs Lab 09/09/15 0338  09/09/15 1416  09/11/15 0235 09/12/15 0532 09/13/15 0500 09/14/15 0357 09/15/15 0445  NA 138  < >  --   < > 139 142 143 141 138  K 5.2*  < >  --   < > 4.4 4.4 3.9 3.7 4.5  CL 98*  < >  --   < >  105 108 105 107 102  CO2 20*  < >  --   < > 21* 21* 26 23 25   GLUCOSE 221*  < >  --   < > 140* 88 94 154* 180*  BUN 114*  < >  --   < > 36* 42* 21* 26* 15  CREATININE 18.71*  < >  --   < > 6.77* 5.93* 3.52* 3.65* 2.98*  CALCIUM 9.4  < >  --   < > 7.8* 8.4* 8.0* 7.8* 7.5*  MG 3.5*  --  3.6*  --  2.2  --   --   --   --   PHOS 17.1*  --   --   < > 6.0* 5.4* 4.0 3.3 3.4  < > = values in this interval not displayed. GFR Estimated Creatinine Clearance: 22.6 mL/min (by C-G formula based on Cr of 2.98). Liver Function Tests:  Recent Labs Lab 09/09/15 0338  09/11/15 0235 09/12/15 0532 09/13/15 0500 09/14/15 0357 09/15/15 0445  AST 12*  --   --   --   --   --   --   ALT 17  --   --   --   --   --   --  ALKPHOS 127*  --   --   --   --   --   --   BILITOT 0.7  --   --   --   --   --   --   PROT 7.0  --   --   --   --   --   --   ALBUMIN 2.1*  < > 1.7* 1.5* 1.5* 1.4* 1.4*  < > = values in this interval not displayed. Coagulation profile  Recent Labs Lab 09/09/15 0338  INR 1.26    CBC:  Recent Labs Lab 09/09/15 0338 09/09/15 1416 09/10/15 0349 09/11/15 0235 09/12/15 0721  WBC 14.0*  --  16.3* 19.2* 17.1*  NEUTROABS 12.2* 13.2*  --   --  13.7*  HGB 8.8*  --  8.9* 8.1* 7.8*  HCT 27.0*  --  27.6* 23.9* 24.4*  MCV 92.5  --  93.9 94.8 95.3  PLT 274  --  253 188 210   Cardiac Enzymes:  Recent Labs Lab 09/09/15 0338 09/09/15 0958 09/09/15 1416  CKTOTAL  --   --  142  TROPONINI 0.03 0.03 0.06*   CBG:  Recent Labs Lab 09/14/15 0558 09/14/15 1124 09/14/15 1627 09/14/15 2143 09/15/15 0730  GLUCAP 130* 95 137* 194* 158*   Microbiology Recent Results (from the past 240 hour(s))  MRSA PCR Screening     Status: None   Collection Time: 09/09/15  2:29 AM  Result Value Ref Range Status   MRSA by PCR NEGATIVE NEGATIVE Final    Comment:        The GeneXpert MRSA Assay (FDA approved for NASAL specimens only), is one component of a comprehensive MRSA  colonization surveillance program. It is not intended to diagnose MRSA infection nor to guide or monitor treatment for MRSA infections.   Culture, blood (routine x 2)     Status: None   Collection Time: 09/09/15  3:38 AM  Result Value Ref Range Status   Specimen Description BLOOD LEFT ARM  Final   Special Requests BOTTLES DRAWN AEROBIC AND ANAEROBIC  Final   Culture NO GROWTH 5 DAYS  Final   Report Status 09/14/2015 FINAL  Final  Culture, blood (routine x 2)     Status: None   Collection Time: 09/09/15  3:50 AM  Result Value Ref Range Status   Specimen Description BLOOD LEFT HAND  Final   Special Requests BOTTLES DRAWN AEROBIC ONLY  Final   Culture NO GROWTH 5 DAYS  Final   Report Status 09/14/2015 FINAL  Final  Urine culture     Status: None   Collection Time: 09/09/15  3:08 PM  Result Value Ref Range Status   Specimen Description URINE, RANDOM  Final   Special Requests NONE  Final   Culture   Final    >=100,000 COLONIES/mL ENTEROBACTER AEROGENES >=100,000 COLONIES/mL GROUP B STREP(S.AGALACTIAE)ISOLATED TESTING AGAINST S. AGALACTIAE NOT ROUTINELY PERFORMED DUE TO PREDICTABILITY OF AMP/PEN/VAN SUSCEPTIBILITY.    Report Status 09/11/2015 FINAL  Final   Organism ID, Bacteria ENTEROBACTER AEROGENES  Final      Susceptibility   Enterobacter aerogenes - MIC*    CEFAZOLIN <=4 RESISTANT Resistant     CEFTRIAXONE <=1 SENSITIVE Sensitive     CIPROFLOXACIN <=0.25 SENSITIVE Sensitive     GENTAMICIN <=1 SENSITIVE Sensitive     IMIPENEM <=0.25 SENSITIVE Sensitive     NITROFURANTOIN 64 INTERMEDIATE Intermediate     TRIMETH/SULFA <=20 SENSITIVE Sensitive     PIP/TAZO 16 SENSITIVE  Sensitive     * >=100,000 COLONIES/mL ENTEROBACTER AEROGENES     Medications:   . aspirin EC  81 mg Oral Daily  . cefTRIAXone (ROCEPHIN)  IV  1 g Intravenous Q24H  . FLUoxetine  20 mg Oral BID  . heparin  5,000 Units Subcutaneous 3 times per day  . insulin aspart  0-15 Units Subcutaneous TID WC   . insulin glargine  5 Units Subcutaneous QHS  . lanthanum  1,000 mg Oral TID WC  . saccharomyces boulardii  250 mg Oral BID  . sodium chloride flush  3 mL Intravenous Q12H  . sodium chloride flush  3 mL Intravenous Q12H   Continuous Infusions:    Time spent: 25 minutes.    LOS: 7 days   RAMA,CHRISTINA  [facility] Pager [phone]. If unable to reach me by pager, please call my cell phone at [phone].  *Please refer to [email or web address], password [email or web address] to get updated schedule on who will round on this patient, as hospitalists switch teams weekly. If 7PM-7AM, please contact night-coverage at [email or web address], password [email or web address] for any overnight needs.  09/15/2015, 10:24 AM

## 2015-09-16 LAB — GLUCOSE, CAPILLARY
GLUCOSE-CAPILLARY: 92 mg/dL (ref 65–99)
Glucose-Capillary: 178 mg/dL — ABNORMAL HIGH (ref 65–99)

## 2015-09-16 LAB — RENAL FUNCTION PANEL
Albumin: 1.4 g/dL — ABNORMAL LOW (ref 3.5–5.0)
Anion gap: 10 (ref 5–15)
BUN: 17 mg/dL (ref 6–20)
CALCIUM: 7.9 mg/dL — AB (ref 8.9–10.3)
CHLORIDE: 108 mmol/L (ref 101–111)
CO2: 26 mmol/L (ref 22–32)
CREATININE: 2.91 mg/dL — AB (ref 0.44–1.00)
GFR, EST AFRICAN AMERICAN: 19 mL/min — AB (ref >60)
GFR, EST NON AFRICAN AMERICAN: 16 mL/min — AB (ref >60)
Glucose, Bld: 130 mg/dL — ABNORMAL HIGH (ref 65–99)
Phosphorus: 3.5 mg/dL (ref 2.5–4.6)
Potassium: 3.6 mmol/L (ref 3.5–5.1)
SODIUM: 144 mmol/L (ref 135–145)

## 2015-09-16 LAB — CBC
HCT: 24.1 % — ABNORMAL LOW (ref 36.0–46.0)
Hemoglobin: 7.4 g/dL — ABNORMAL LOW (ref 12.0–15.0)
MCH: 30.1 pg (ref 26.0–34.0)
MCHC: 30.7 g/dL (ref 30.0–36.0)
MCV: 98 fL (ref 78.0–100.0)
PLATELETS: 216 10*3/uL (ref 150–400)
RBC: 2.46 MIL/uL — ABNORMAL LOW (ref 3.87–5.11)
RDW: 15.4 % (ref 11.5–15.5)
WBC: 6.1 10*3/uL (ref 4.0–10.5)

## 2015-09-16 MED ORDER — INSULIN GLARGINE 100 UNIT/ML ~~LOC~~ SOLN
8.0000 [IU] | Freq: Every day | SUBCUTANEOUS | Status: DC
  Administered 2015-09-16 – 2015-09-20 (×5): 8 [IU] via SUBCUTANEOUS
  Filled 2015-09-16 (×6): qty 0.08

## 2015-09-16 MED ORDER — NEPRO/CARBSTEADY PO LIQD: 237.0000 mL | Freq: Two times a day (BID) | ORAL | Status: DC

## 2015-09-16 MED ORDER — LORAZEPAM 0.5 MG PO TABS
0.5000 mg | ORAL_TABLET | Freq: Once | ORAL | Status: AC
  Administered 2015-09-16: 0.5 mg via ORAL
  Filled 2015-09-16: qty 1

## 2015-09-16 NOTE — Consult Note (Signed)
WOC wound consult note Reason for Consult: Stage 3 pressure injuries on bilateral buttocks. Patient seen by this writer on 09/09/15 for care of the right thigh wound and on 09/11/15 by my partner D. Sylvie FarrierEngels for assessment and implementation of a POC for moisture associated lesions (intertriginous and perhaps due in part to a medication reaction, see that note for more information). Today Nursing and MD report new lesions on the fleshy part of the buttocks, full thickness, Stage 3. Sons and bedside RN present for assessment and discussion of POC and all plus patient are in agreement. All also note that the mid-back lesions are improved. Wound type:Pressure and shear in the presence of moisture Pressure Ulcer POA: No Measurement: Gluteal cleft: 9cm x 2cm with depth obscured by the presence of yellow slough. Moderate amount of ligh yellow exudate.  Proximal back skin fold with intertriginous dermatitis (ITD) that appears to be improving with use of the foam silicone dressing.  Today's measurement for that area is 2.5cm x 0.2cm x 0.1cm with a pale pink wound bed with scant serous exudate (improved). New today are three areas on the bilateral buttocks; two on the left and 1 on the right.  Left distal measures 5cm x 3cm x 0.2cm, proximal measures 1cm round x 0.2cm.  Right buttock lesion measures 4cm x 5cm x 0.1cm.  These three areas are 50% pink and 50% yellow with basement cell membrane evident. Moderate amount of serous to light yellow exudate with a musty odor is noted. Wound bed:As described above. Drainage (amount, consistency, odor) As described above. Periwound: Dry, peeling. Dressing procedure/placement/frequency: I will provide the patient with a mattress replacement today to assist in management of the microclimate. Staff is reminded to minimize linen use between the patient and this therapeutic sleep surface. Patient is taught that she must turn side to side except for meals, avoiding the supine position.   Family is present for that instruction and agree to assist. [facility] is to be at or below a 30 degree angle except for meals to minimize skin injury due to friction and shear forces. Topical care for the new lesions will be the placement of an antimicrobial hydrofiber dressing topped by placement of a soft silicone foam dressing for the sacrum, folded to accommodate the intergluteal cleft as well as cover the buttock lesions (3).  WOC nursing team will not follow, but will remain available to this patient, the nursing and medical teams.  Please re-consult if needed. Thanks, Ladona MowLaurie Sabatino Williard, MSN, RN, GNP, Hans EdenCWOCN, CWON-AP, FAAN  Pager# [phone]

## 2015-09-16 NOTE — Procedures (Signed)
Pt seen on HD.  Ap 90 VP 70.  BFR 300.  Starting to make some urine which is encouraging.  Tolerating HD well so far.  Renal fx should recover.  K sl low, on 4K bath

## 2015-09-16 NOTE — Progress Notes (Signed)
Patient's sister is at bedside.  Patient is crying and rocking back and forth on the side of the bed.  She has no c/o pain.  Upon further questioning, she states she is feeling anxious but cannot state the reason.  She had vomited around dinner time.  [facility] called and made aware.  Received order for Ativan 0.5 mg PO which was given to the patient.  Upon reassessment, patient is calm and resting comfortably.  Will continue to monitor patient.  Bernie CoveyKimberly Hero Mccathern RN-BC, [employer]TA

## 2015-09-16 NOTE — Progress Notes (Signed)
Progress Note   [name] ZOX:[id number] DOB: 1955-09-04 DOA: 09/08/2015 PCP: Sophia Boston, MD   Brief Narrative:   [name] is an 60 y.o. female with a PMH of hypertension and insulin-dependent diabetes was transferred from [facility] for evaluation of acute renal failure and associated hyperkalemia. Patient was recently hospitalized 08/07/15-08/15/15 for treatment of sepsis secondary to necrotizing fasciitis. She was discharged home on clindamycin. A follow-up check of her creatinine on 09/05/15 revealed marked elevation of 15.7 (1.16 at discharge).   Subjective:   [name] denies nausea.  AppetiteOkay. No vomiting.  Had a loose stool today. Denies pain.     Assessment/Plan:      Acute renal failure (ARF) (HCC) Admission creatinine 18.71. Creatinine last noted to be 1.16 at her prior hospital discharge. Nephrology consultation performed 09/09/15 and the patient was subsequently taken for hemodialysis.  Urinalysis negative for casts. Renal ultrasound negative for hydronephrosis. Etiology presumed to be ATN. Prognosis for recovery of renal function felt to be good. Continue HD support until renal function recovers.   Enterobacter and group B strep UTI Unclear if this was colonization versus a true UTI. Afebrile but WBC elevated. Completed treatment with Rocephin .   Necrotizing fasciitis (HCC) of inner thigh Patient has a large open wound on her medial right thigh after debridement last month. She has been receiving wound care through home health services. She completed a course of clindamycin. One set of blood cultures was drawn on admission. Wound care nurse evaluated patient 09/09/15 and placed dressing change orders. Wound seen by surgery, recommends ongoing W--->D dressing changes, F/U with Dr. Sheliah [name] which was arranged.    IDDM (insulin dependent diabetes mellitus) (HCC) Home medications on hold including [place]. Hemoglobin A1c 10% on  09/05/15 indicating poor outpatient glycemic control. Currently being managed with 8 units of Lantus and moderate scale SSI 3 times a day. May need to titrate insulin up as renal recovery ensues.  CBG (last 3)   Recent Labs  09/15/15 1202 09/15/15 1655 09/15/15 2156  GLUCAP 203* 178* 138*       History of hypertension Metoprolol on hold. Blood pressure currently controlled off metoprolol.   Hyperphosphatemia Phosphorus normalized with hemodialysis and treatment with Fosrenol.   Generalized exfoliative dermatitis This appears to be new since her recent hospital discharge, and is concerning for a possible adverse reaction.   Normocytic anemia- AOCD Consistent with usual baseline values. Likely anemia of chronic disease. Ferriheme given per Renal.     DVT Prophylaxis Subcutaneous heparin ordered.   Family Communication/Anticipated D/C date and plan/Code Status   Family Communication: No family currently at the bedside.Told patient to have the nurse page me when her family arrives. Disposition Plan/date: Home when renal function stable. PT recommends discharge home with intermittent supervision, home health PT and a 3 in 1. Code Status: Full code.   Procedures and diagnostic studies:   [place] Renal  09/09/2015  CLINICAL DATA:  Acute onset of renal insufficiency. Initial encounter. EXAM: RENAL / URINARY TRACT ULTRASOUND COMPLETE COMPARISON:  CT of the pelvis performed 08/07/2015 FINDINGS: Right Kidney: Length: 11.3 cm. Echogenicity within normal limits. No mass or hydronephrosis visualized. Left Kidney: Length: 11.3 cm. Echogenicity within normal limits. No mass or hydronephrosis visualized. Bladder: Appears normal for degree of bladder distention. IMPRESSION: Unremarkable renal ultrasound. Electronically Signed   By: [name] M.D.   On: 09/09/2015 05:38   Dg Chest Port 1 View  09/09/2015  CLINICAL DATA:  Hypertension, diabetes, acute renal failure EXAM: PORTABLE CHEST 1 VIEW  COMPARISON:  08/10/2015 FINDINGS: Right IJ temporary dialysis catheter tip at the level of the mid SVC. Exam is slightly rotated to the left. Low lung volumes evident. Heart is enlarged with central vascular congestion. Minor basilar atelectasis. No effusion or pneumothorax. IMPRESSION: Right IJ temporary dialysis catheter tip mid SVC level. Cardiomegaly with vascular congestion Low lung volumes and basilar atelectasis Electronically Signed   By: Sophia PetitM.  Shick M.D.   On: 09/09/2015 14:46    Medical Consultants:    Nephrology: Dr. Otho [name]  Anti-Infectives:   Anti-infectives    Start     Dose/Rate Route Frequency Ordered Stop   09/12/15 1000  cefTRIAXone (ROCEPHIN) 1 g in dextrose 5 % 50 mL IVPB  Status:  Discontinued     1 g 100 mL/hr over 30 Minutes Intravenous Every 24 hours 09/12/15 0905 09/15/15 1544       Objective:    Filed Vitals:   09/16/15 0800 09/16/15 0830 09/16/15 0900 09/16/15 0930  BP: 119/65 116/62 139/84 159/82  Pulse: 103 100 103 104  Temp:      TempSrc:      Resp:      Height:      Weight:      SpO2:        Intake/Output Summary (Last 24 hours) at 09/16/15 1017 Last data filed at 09/16/15 0600  Gross per 24 hour  Intake    340 ml  Output    575 ml  Net   -235 ml   Filed Weights   09/14/15 1046 09/15/15 0340 09/16/15 0627  Weight: 98.9 kg (218 lb 0.6 oz) 99.474 kg (219 lb 4.8 oz) 97.1 kg (214 lb 1.1 oz)    Exam: Gen: Alert, NAD Cardiovascular:  Tachycardic, No M/R/G Respiratory:  Diminished but clear Gastrointestinal:  Abdomen soft, NT/ND, + BS Extremities:  Right thigh dressing intact L IJ Dialysis Catheter  Data Reviewed:    Labs: Basic Metabolic Panel:  Recent Labs Lab 09/09/15 1416  09/11/15 0235 09/12/15 0532 09/13/15 0500 09/14/15 0357 09/15/15 0445 09/16/15 0524  NA  --   < > 139 142 143 141 138 144  K  --   < > 4.4 4.4 3.9 3.7 4.5 3.6  CL  --   < > 105 108 105 107 102 108  CO2  --   < > 21* 21* 26 23 25 26   GLUCOSE  --    < > 140* 88 94 154* 180* 130*  BUN  --   < > 36* 42* 21* 26* 15 17  CREATININE  --   < > 6.77* 5.93* 3.52* 3.65* 2.98* 2.91*  CALCIUM  --   < > 7.8* 8.4* 8.0* 7.8* 7.5* 7.9*  MG 3.6*  --  2.2  --   --   --   --   --   PHOS  --   < > 6.0* 5.4* 4.0 3.3 3.4 3.5  < > = values in this interval not displayed. GFR Estimated Creatinine Clearance: 22.8 mL/min (by C-G formula based on Cr of 2.91). Liver Function Tests:  Recent Labs Lab 09/12/15 0532 09/13/15 0500 09/14/15 0357 09/15/15 0445 09/16/15 0524  ALBUMIN 1.5* 1.5* 1.4* 1.4* 1.4*   Coagulation profile No results for input(s): INR, PROTIME in the last 168 hours.  CBC:  Recent Labs Lab 09/09/15 1416 09/10/15 0349 09/11/15 0235 09/12/15 0721 09/16/15 0742  WBC  --  16.3* 19.2* 17.1* 6.1  NEUTROABS 13.2*  --   --  13.7*  --   HGB  --  8.9* 8.1* 7.8* 7.4*  HCT  --  27.6* 23.9* 24.4* 24.1*  MCV  --  93.9 94.8 95.3 98.0  PLT  --  253 188 210 216   CBG:  Recent Labs Lab 09/14/15 2143 09/15/15 0730 09/15/15 1202 09/15/15 1655 09/15/15 2156  GLUCAP 194* 158* 203* 178* 138*   Microbiology Recent Results (from the past 240 hour(s))  MRSA PCR Screening     Status: None   Collection Time: 09/09/15  2:29 AM  Result Value Ref Range Status   MRSA by PCR NEGATIVE NEGATIVE Final    Comment:        The GeneXpert MRSA Assay (FDA approved for NASAL specimens only), is one component of a comprehensive MRSA colonization surveillance program. It is not intended to diagnose MRSA infection nor to guide or monitor treatment for MRSA infections.   Culture, blood (routine x 2)     Status: None   Collection Time: 09/09/15  3:38 AM  Result Value Ref Range Status   Specimen Description BLOOD LEFT ARM  Final   Special Requests BOTTLES DRAWN AEROBIC AND ANAEROBIC  Final   Culture NO GROWTH 5 DAYS  Final   Report Status 09/14/2015 FINAL  Final  Culture, blood (routine x 2)     Status: None   Collection Time: 09/09/15  3:50 AM   Result Value Ref Range Status   Specimen Description BLOOD LEFT HAND  Final   Special Requests BOTTLES DRAWN AEROBIC ONLY  Final   Culture NO GROWTH 5 DAYS  Final   Report Status 09/14/2015 FINAL  Final  Urine culture     Status: None   Collection Time: 09/09/15  3:08 PM  Result Value Ref Range Status   Specimen Description URINE, RANDOM  Final   Special Requests NONE  Final   Culture   Final    >=100,000 COLONIES/mL ENTEROBACTER AEROGENES >=100,000 COLONIES/mL GROUP B STREP(S.AGALACTIAE)ISOLATED TESTING AGAINST S. AGALACTIAE NOT ROUTINELY PERFORMED DUE TO PREDICTABILITY OF AMP/PEN/VAN SUSCEPTIBILITY.    Report Status 09/11/2015 FINAL  Final   Organism ID, Bacteria ENTEROBACTER AEROGENES  Final      Susceptibility   Enterobacter aerogenes - MIC*    CEFAZOLIN <=4 RESISTANT Resistant     CEFTRIAXONE <=1 SENSITIVE Sensitive     CIPROFLOXACIN <=0.25 SENSITIVE Sensitive     GENTAMICIN <=1 SENSITIVE Sensitive     IMIPENEM <=0.25 SENSITIVE Sensitive     NITROFURANTOIN 64 INTERMEDIATE Intermediate     TRIMETH/SULFA <=20 SENSITIVE Sensitive     PIP/TAZO 16 SENSITIVE Sensitive     * >=100,000 COLONIES/mL ENTEROBACTER AEROGENES     Medications:   . aspirin EC  81 mg Oral Daily  . feeding supplement (NEPRO CARB STEADY)  237 mL Oral BID BM  . FLUoxetine  20 mg Oral BID  . heparin  5,000 Units Subcutaneous 3 times per day  . insulin aspart  0-15 Units Subcutaneous TID WC  . insulin glargine  5 Units Subcutaneous QHS  . lanthanum  1,000 mg Oral TID WC  . saccharomyces boulardii  250 mg Oral BID  . sodium chloride flush  3 mL Intravenous Q12H  . sodium chloride flush  3 mL Intravenous Q12H   Continuous Infusions:    Time spent: 25 minutes.    LOS: 8 days   Signature  Susa Raring K M.D on 09/16/2015 at 10:18 AM  Between 7am to 7pm -  Pager - [phone], After 7pm go to [email or web address] - password [facility]  [facility]  - Office   [phone]

## 2015-09-16 NOTE — Progress Notes (Signed)
Initial Nutrition Assessment  DOCUMENTATION CODES:  Severe malnutrition in context of acute illness/injury, Obesity unspecified   Pt meets criteria for SEVERE MALNUTRITION in the context of ACUTE ILLNESS as evidenced by Loss of >5% bw in 1 month and an oral intake that is estimated to have met < or equal to 50% of her needs for > or equal to 5 days.  INTERVENTION:  HS snack to provide greater protein intake  NUTRITION DIAGNOSIS:  Increased nutrient needs related to acute illness, wound healing (Acute HD) as evidenced by loss of >5% bw x 1 month  GOAL:  Patient will meet greater than or equal to 90% of their needs  MONITOR:  PO intake, Supplement acceptance, Labs, Weight trends, Skin  REASON FOR ASSESSMENT:  Malnutrition Screening Tool    ASSESSMENT:  59 y/o PMHx HTN, IDDM who initially presented as tranfer from [facility] with Acute Renal Failure and hyperkalemia. Patient has been being treated with HD. She had recently been admitted for right medial thigh abscess complicated by nec. Fasciitis. She still has a residual wound  Per EMR documentation, pt has been eating very poorly throughout her stay. She frequently does not eat   Pt states that she had lost her appetite roughly 1 month ago. PTA, she had been eating 3 meals each day, which is her baseline, but they were smaller. She says 50% smaller. Denied taking any vitamins/minerals. She stated that she did follow a DM diet, but her response to this question was far from convincing. Her a1c taken on 4/1 was 9.7  Her weight loss is hard to quantify, however it does appear to be >5% in last month. She waas discharged last admission at weight of 228 lbs and represented at 204 lbs.   She has been having bouts of diarrhea.   RD addressed need for greater protein/calorie intake given her recent HD and wound. She does not like any of the protein supplements. She was agreeable to a HS snack such as a sandwich.   NFPE WDL.   Labs  reviewed: rbc: 2.46, h/h: 7.4/24.1, albumin 1.4   Recent Labs Lab 09/11/15 0235  09/14/15 0357 09/15/15 0445 09/16/15 0524  NA 139  < > 141 138 144  K 4.4  < > 3.7 4.5 3.6  CL 105  < > 107 102 108  CO2 21*  < > 23 25 26  BUN 36*  < > 26* 15 17  CREATININE 6.77*  < > 3.65* 2.98* 2.91*  CALCIUM 7.8*  < > 7.8* 7.5* 7.9*  MG 2.2  --   --   --   --   PHOS 6.0*  < > 3.3 3.4 3.5  GLUCOSE 140*  < > 154* 180* 130*  < > = values in this interval not displayed.  Diet Order:  Diet renal with fluid restriction Fluid restriction:: 1200 mL Fluid; Room service appropriate?: Yes; Fluid consistency:: Thin  Skin: MSAD, Left and right butocks PUs stage 2, large open wounds to right thigh  Last BM:  4/7  Height:  Ht Readings from Last 1 Encounters:  09/15/15 5' 3" (1.6 m)   Weight:  Wt Readings from Last 1 Encounters:  09/16/15 214 lb 1.1 oz (97.1 kg)   Wt Readings from Last 10 Encounters:  09/16/15 214 lb 1.1 oz (97.1 kg)  08/18/15 228 lb (103.42 kg)  08/14/15 256 lb 13.4 oz (116.5 kg)  Lowest weight this admission is 203 lbs (92.27 kg).   Ideal Body Weight:    52.27 kg  BMI:  Body mass index is 37.93 kg/(m^2).  Estimated Nutritional Needs:  Kcal:  1700-1900 (18-20 kcal/kg bw) Protein:  73-84 g Pro (1.4-1.6 kg/ibw) Fluid:  1.2 liters fluid  EDUCATION NEEDS:  No education needs identified at this time    RD, LDN Clinical Nutrition Pager: [phone] 09/16/2015 5:01 PM  

## 2015-09-17 LAB — BASIC METABOLIC PANEL
Anion gap: 10 (ref 5–15)
BUN: 7 mg/dL (ref 6–20)
CALCIUM: 7.9 mg/dL — AB (ref 8.9–10.3)
CO2: 29 mmol/L (ref 22–32)
CREATININE: 2.18 mg/dL — AB (ref 0.44–1.00)
Chloride: 106 mmol/L (ref 101–111)
GFR, EST AFRICAN AMERICAN: 27 mL/min — AB (ref >60)
GFR, EST NON AFRICAN AMERICAN: 23 mL/min — AB (ref >60)
Glucose, Bld: 129 mg/dL — ABNORMAL HIGH (ref 65–99)
Potassium: 3.9 mmol/L (ref 3.5–5.1)
SODIUM: 145 mmol/L (ref 135–145)

## 2015-09-17 LAB — GLUCOSE, CAPILLARY
GLUCOSE-CAPILLARY: 96 mg/dL (ref 65–99)
Glucose-Capillary: 103 mg/dL — ABNORMAL HIGH (ref 65–99)
Glucose-Capillary: 91 mg/dL (ref 65–99)
Glucose-Capillary: 145 mg/dL — ABNORMAL HIGH (ref 65–99)

## 2015-09-17 MED ORDER — FUROSEMIDE 80 MG PO TABS
160.0000 mg | ORAL_TABLET | Freq: Two times a day (BID) | ORAL | Status: DC
  Administered 2015-09-17 – 2015-09-18 (×3): 160 mg via ORAL
  Filled 2015-09-17 (×3): qty 2

## 2015-09-17 MED ORDER — DIPHENHYDRAMINE HCL 25 MG PO CAPS
25.0000 mg | ORAL_CAPSULE | ORAL | Status: AC | PRN
  Administered 2015-09-17 – 2015-09-18 (×3): 25 mg via ORAL
  Filled 2015-09-17 (×3): qty 1

## 2015-09-17 MED ORDER — SODIUM CHLORIDE 0.9 % IV SOLN
510.0000 mg | Freq: Once | INTRAVENOUS | Status: AC
  Administered 2015-09-17: 510 mg via INTRAVENOUS
  Filled 2015-09-17: qty 17

## 2015-09-17 NOTE — Progress Notes (Signed)
Progress Note   Sophia EllisonGenevieve A Macpherson ZOX:[id number] DOB: 1955-06-22 DOA: 09/08/2015 PCP: Louie BostonAPPER,[name], MD   Brief Narrative:   [name] is an 60 y.o. female with a PMH of hypertension and insulin-dependent diabetes [facility] evaluation of acute renal failure and associated hyperkalemia. Patient was recently hospitalized 08/07/15-08/15/15 for treatment of sepsis secondary to necrotizing fasciitis. She was discharged home on clindamycin. A follow-up check of her creatinine on 09/05/15 revealed marked elevation of 15.7 (1.16 at discharge).   Subjective:   [name] denies nausea.  AppetiteOkay. No vomiting.  Had a loose stool today. Denies pain.     Assessment/Plan:   Acute renal failure (ARF) (HCC) Admission creatinine 18.71. Creatinine last noted to be 1.16 at her prior hospital discharge. Nephrology consultation performed 09/09/15 and the patient was subsequently taken for hemodialysis.  Urinalysis negative for casts. Renal ultrasound negative for hydronephrosis. Etiology presumed to be ATN. Prognosis for recovery of renal function felt to be good. Continue HD support until renal function recovers.  Enterobacter and group B strep UTI Unclear if this was colonization versus a true UTI. Afebrile but WBC elevated. Completed treatment with Rocephin .   Necrotizing fasciitis (HCC) of R. inner thigh Patient has a large open wound on her medial right thigh after debridement last month. She has been receiving wound care through home health services. She completed a course of clindamycin. One set of blood cultures was drawn on admission. Wound care nurse evaluated patient 09/09/15 and placed dressing change orders. Wound seen by surgery, recommends ongoing W--->D dressing changes, F/U with Dr. Sheliah HatchKinsinger which was arranged.     IDDM (insulin dependent diabetes mellitus) (HCC) Home medications on hold including [place]Farxiga. Hemoglobin A1c 10% on  09/05/15 indicating poor outpatient glycemic control. Currently being managed with 8 units of Lantus and moderate scale SSI 3 times a day. May need to titrate insulin up as renal recovery ensues.  CBG (last 3)   Recent Labs  09/16/15 1112 09/16/15 1624 09/17/15 0821  GLUCAP 92 178* 96    History of hypertension Metoprolol on hold. Blood pressure currently controlled off metoprolol.  Hyperphosphatemia Phosphorus normalized with hemodialysis and treatment with Fosrenol.  Generalized exfoliative dermatitis This appears to be new since her recent hospital discharge, and is concerning for a possible adverse reaction.  Normocytic anemia- AOCD Consistent with usual baseline values. Likely anemia of chronic disease. Ferriheme given per Renal.     DVT Prophylaxis Subcutaneous heparin ordered.   Family Communication/Anticipated D/C date and plan/Code Status   Family Communication: No family currently at the bedside.Told patient to have the nurse page me when her family arrives. Disposition Plan/date: Home when renal function stable. PT recommends discharge home with intermittent supervision, home health PT and a 3 in 1. Code Status: Full code.   Procedures and diagnostic studies:   [place] Renal  09/09/2015  CLINICAL DATA:  Acute onset of renal insufficiency. Initial encounter. EXAM: RENAL / URINARY TRACT ULTRASOUND COMPLETE COMPARISON:  CT of the pelvis performed 08/07/2015 FINDINGS: Right Kidney: Length: 11.3 cm. Echogenicity within normal limits. No mass or hydronephrosis visualized. Left Kidney: Length: 11.3 cm. Echogenicity within normal limits. No mass or hydronephrosis visualized. Bladder: Appears normal for degree of bladder distention. IMPRESSION: Unremarkable renal ultrasound. Electronically Signed   By: Roanna RaiderJeffery  Chang M.D.   On: 09/09/2015 05:38   Dg Chest Port 1 View  09/09/2015  CLINICAL DATA:  Hypertension, diabetes, acute renal failure EXAM: PORTABLE CHEST  1 VIEW COMPARISON:   08/10/2015 FINDINGS: Right IJ temporary dialysis catheter tip at the level of the mid SVC. Exam is slightly rotated to the left. Low lung volumes evident. Heart is enlarged with central vascular congestion. Minor basilar atelectasis. No effusion or pneumothorax. IMPRESSION: Right IJ temporary dialysis catheter tip mid SVC level. Cardiomegaly with vascular congestion Low lung volumes and basilar atelectasis Electronically Signed   By: Judie Petit.  Shick M.D.   On: 09/09/2015 14:46    Medical Consultants:    Nephrology: Dr. Otho Bellows  Anti-Infectives:   Anti-infectives    Start     Dose/Rate Route Frequency Ordered Stop   09/12/15 1000  cefTRIAXone (ROCEPHIN) 1 g in dextrose 5 % 50 mL IVPB  Status:  Discontinued     1 g 100 mL/hr over 30 Minutes Intravenous Every 24 hours 09/12/15 0905 09/15/15 1544       Objective:    Filed Vitals:   09/16/15 1613 09/16/15 2107 09/17/15 0631 09/17/15 0822  BP: 106/60 97/46 119/57 110/52  Pulse: 72 141 109 104  Temp: 99.2 F (37.3 C) 99.3 F (37.4 C) 99.1 F (37.3 C) 99 F (37.2 C)  TempSrc: Oral Oral Oral Oral  Resp: Height:      Weight:  97.297 kg (214 lb 8 oz)    SpO2: 99% 97% 99% 100%    Intake/Output Summary (Last 24 hours) at 09/17/15 1046 Last data filed at 09/17/15 [id number]  Gross per 24 hour  Intake    730 ml  Output    350 ml  Net    380 ml   Filed Weights   09/16/15 0627 09/16/15 1012 09/16/15 2107  Weight: 97.1 kg (214 lb 1.1 oz) 97.1 kg (214 lb 1.1 oz) 97.297 kg (214 lb 8 oz)    Exam: Gen: Alert, NAD Cardiovascular:  Tachycardic, No M/R/G Respiratory:  Diminished but clear Gastrointestinal:  Abdomen soft, NT/ND, + BS Extremities:  Right thigh dressing intact R IJ Dialysis Catheter  Data Reviewed:    Labs: Basic Metabolic Panel:  Recent Labs Lab 09/11/15 0235 09/12/15 0532 09/13/15 0500 09/14/15 0357 09/15/15 0445 09/16/15 0524 09/17/15 0527  NA 139 142 143 141 138 144 145  K 4.4 4.4 3.9 3.7 4.5 3.6  3.9  CL 105 108 105 107 102 108 106  CO2 21* 21* GLUCOSE 140* 88 94 154* 180* 130* 129*  BUN 36* 42* 21* 26* CREATININE 6.77* 5.93* 3.52* 3.65* 2.98* 2.91* 2.18*  CALCIUM 7.8* 8.4* 8.0* 7.8* 7.5* 7.9* 7.9*  MG 2.2  --   --   --   --   --   --   PHOS 6.0* 5.4* 4.0 3.3 3.4 3.5  --    GFR Estimated Creatinine Clearance: 30.5 mL/min (by C-G formula based on Cr of 2.18). Liver Function Tests:  Recent Labs Lab 09/12/15 0532 09/13/15 0500 09/14/15 0357 09/15/15 0445 09/16/15 0524  ALBUMIN 1.5* 1.5* 1.4* 1.4* 1.4*   Coagulation profile No results for input(s): INR, PROTIME in the last 168 hours.  CBC:  Recent Labs Lab 09/11/15 0235 09/12/15 0721 09/16/15 0742  WBC 19.2* 17.1* 6.1  NEUTROABS  --  13.7*  --   HGB 8.1* 7.8* 7.4*  HCT 23.9* 24.4* 24.1*  MCV 94.8 95.3 98.0  PLT 188 210 216   CBG:  Recent Labs Lab 09/15/15 1655 09/15/15 2156 09/16/15 1112 09/16/15 1624 09/17/15 0821  GLUCAP 178* 138* 92 178*  96   Microbiology Recent Results (from the past 240 hour(s))  MRSA PCR Screening     Status: None   Collection Time: 09/09/15  2:29 AM  Result Value Ref Range Status   MRSA by PCR NEGATIVE NEGATIVE Final    Comment:        The GeneXpert MRSA Assay (FDA approved for NASAL specimens only), is one component of a comprehensive MRSA colonization surveillance program. It is not intended to diagnose MRSA infection nor to guide or monitor treatment for MRSA infections.   Culture, blood (routine x 2)     Status: None   Collection Time: 09/09/15  3:38 AM  Result Value Ref Range Status   Specimen Description BLOOD LEFT ARM  Final   Special Requests BOTTLES DRAWN AEROBIC AND ANAEROBIC  Final   Culture NO GROWTH 5 DAYS  Final   Report Status 09/14/2015 FINAL  Final  Culture, blood (routine x 2)     Status: None   Collection Time: 09/09/15  3:50 AM  Result Value Ref Range Status   Specimen Description BLOOD LEFT HAND  Final   Special  Requests BOTTLES DRAWN AEROBIC ONLY  Final   Culture NO GROWTH 5 DAYS  Final   Report Status 09/14/2015 FINAL  Final  Urine culture     Status: None   Collection Time: 09/09/15  3:08 PM  Result Value Ref Range Status   Specimen Description URINE, RANDOM  Final   Special Requests NONE  Final   Culture   Final    >=100,000 COLONIES/mL ENTEROBACTER AEROGENES >=100,000 COLONIES/mL GROUP B STREP(S.AGALACTIAE)ISOLATED TESTING AGAINST S. AGALACTIAE NOT ROUTINELY PERFORMED DUE TO PREDICTABILITY OF AMP/PEN/VAN SUSCEPTIBILITY.    Report Status 09/11/2015 FINAL  Final   Organism ID, Bacteria ENTEROBACTER AEROGENES  Final      Susceptibility   Enterobacter aerogenes - MIC*    CEFAZOLIN <=4 RESISTANT Resistant     CEFTRIAXONE <=1 SENSITIVE Sensitive     CIPROFLOXACIN <=0.25 SENSITIVE Sensitive     GENTAMICIN <=1 SENSITIVE Sensitive     IMIPENEM <=0.25 SENSITIVE Sensitive     NITROFURANTOIN 64 INTERMEDIATE Intermediate     TRIMETH/SULFA <=20 SENSITIVE Sensitive     PIP/TAZO 16 SENSITIVE Sensitive     * >=100,000 COLONIES/mL ENTEROBACTER AEROGENES     Medications:   . aspirin EC  81 mg Oral Daily  . feeding supplement (NEPRO CARB STEADY)  237 mL Oral BID BM  . ferumoxytol  510 mg Intravenous Once  . FLUoxetine  20 mg Oral BID  . furosemide  160 mg Oral BID  . heparin  5,000 Units Subcutaneous 3 times per day  . insulin aspart  0-15 Units Subcutaneous TID WC  . insulin glargine  8 Units Subcutaneous QHS  . lanthanum  1,000 mg Oral TID WC  . saccharomyces boulardii  250 mg Oral BID  . sodium chloride flush  3 mL Intravenous Q12H   Continuous Infusions:    Time spent: 25 minutes.    LOS: 9 days   Signature  Susa Raring K M.D on 09/17/2015 at 10:46 AM  Between 7am to 7pm - Pager - [phone], After 7pm go to [email or web address] - password [facility]  [facility]  - Office  [phone]

## 2015-09-17 NOTE — Progress Notes (Signed)
Patient c/o anxiousness.  She is asking if she can have what she had the previous night for her anxiety.  Called [facility] and made aware.  Received order for lorazepam 0.5 mg PO.  This was given at 2225 with good results.  Will continue to monitor patient.  Owens [employer] MinorKimberly Yanis Juma RN-BC, WTA.

## 2015-09-17 NOTE — Progress Notes (Signed)
Patient's  Heart rate more than 180/min as per monitor.Charge Nurse assessed patient,asymptomatic,skin warm and dry,alert,oriented X 4. Heart rte counted for a full minute-105.Telemetry made aware.

## 2015-09-17 NOTE — Progress Notes (Signed)
S: No new CO.  Says she is making more urine O:BP 110/52 mmHg  Pulse 104  Temp(Src) 99 F (37.2 C) (Oral)  Resp 18  Ht 5\' 3"  (1.6 m)  Wt 97.297 kg (214 lb 8 oz)  BMI 38.01 kg/m2  SpO2 100%  Intake/Output Summary (Last 24 hours) at 09/17/15 0914 Last data filed at 09/17/15 [id number]  Gross per 24 hour  Intake    730 ml  Output    350 ml  Net    380 ml   Weight change: 0 kg (0 lb) BMW:UXLKGGen:awake and alert CVS: Sl tachy, reg Resp:clear Abd:+ BS NTND Ext: large wound inner Rt thigh NEURO:CNI Ox3 no asterixis Skin: Xerosis Rt IJ temp cath   . aspirin EC  81 mg Oral Daily  . feeding supplement (NEPRO CARB STEADY)  237 mL Oral BID BM  . FLUoxetine  20 mg Oral BID  . heparin  5,000 Units Subcutaneous 3 times per day  . insulin aspart  0-15 Units Subcutaneous TID WC  . insulin glargine  8 Units Subcutaneous QHS  . lanthanum  1,000 mg Oral TID WC  . saccharomyces boulardii  250 mg Oral BID  . sodium chloride flush  3 mL Intravenous Q12H   No results found. BMET    Component Value Date/Time   NA 145 09/17/2015 0527   K 3.9 09/17/2015 0527   CL 106 09/17/2015 0527   CO2 29 09/17/2015 0527   GLUCOSE 129* 09/17/2015 0527   BUN 7 09/17/2015 0527   CREATININE 2.18* 09/17/2015 0527   CALCIUM 7.9* 09/17/2015 0527   GFRNONAA 23* 09/17/2015 0527   GFRAA 27* 09/17/2015 0527   CBC    Component Value Date/Time   WBC 6.1 09/16/2015 0742   RBC 2.46* 09/16/2015 0742   HGB 7.4* 09/16/2015 0742   HCT 24.1* 09/16/2015 0742   PLT 216 09/16/2015 0742   MCV 98.0 09/16/2015 0742   MCH 30.1 09/16/2015 0742   MCHC 30.7 09/16/2015 0742   RDW 15.4 09/16/2015 0742   LYMPHSABS 1.1 09/12/2015 0721   MONOABS 0.7 09/12/2015 0721   EOSABS 1.5* 09/12/2015 0721   BASOSABS 0.1 09/12/2015 0721     Assessment: 1. ARF presumably due to ATN, on HD,  UO improving? 2. Enterobacter UTI, on ceftriaxone 3 DM 4. Fasciitis Rt thigh 5. Anemia, will give 2nd dose feraheme  Plan: 1.DC foley as she says  she will collect all urine  2. IV feraheme 3. Resume lasix to see if can stimulate more UO 4. Make decision about further HD based on UO and labs Neil Brickell T

## 2015-09-18 DIAGNOSIS — Z8679 Personal history of other diseases of the circulatory system: Secondary | ICD-10-CM

## 2015-09-18 DIAGNOSIS — E871 Hypo-osmolality and hyponatremia: Secondary | ICD-10-CM

## 2015-09-18 LAB — GLUCOSE, CAPILLARY
GLUCOSE-CAPILLARY: 224 mg/dL — AB (ref 65–99)
Glucose-Capillary: 111 mg/dL — ABNORMAL HIGH (ref 65–99)
Glucose-Capillary: 120 mg/dL — ABNORMAL HIGH (ref 65–99)
Glucose-Capillary: 99 mg/dL (ref 65–99)

## 2015-09-18 LAB — RENAL FUNCTION PANEL
ALBUMIN: 1.7 g/dL — AB (ref 3.5–5.0)
Anion gap: 11 (ref 5–15)
BUN: 11 mg/dL (ref 6–20)
CALCIUM: 8.4 mg/dL — AB (ref 8.9–10.3)
CO2: 30 mmol/L (ref 22–32)
CREATININE: 2.62 mg/dL — AB (ref 0.44–1.00)
Chloride: 107 mmol/L (ref 101–111)
GFR calc Af Amer: 22 mL/min — ABNORMAL LOW (ref >60)
GFR calc non Af Amer: 19 mL/min — ABNORMAL LOW (ref >60)
GLUCOSE: 115 mg/dL — AB (ref 65–99)
PHOSPHORUS: 3.2 mg/dL (ref 2.5–4.6)
POTASSIUM: 3.7 mmol/L (ref 3.5–5.1)
SODIUM: 148 mmol/L — AB (ref 135–145)

## 2015-09-18 LAB — TSH: TSH: 1.751 u[IU]/mL (ref 0.350–4.500)

## 2015-09-18 MED ORDER — METOPROLOL TARTRATE 25 MG PO TABS
25.0000 mg | ORAL_TABLET | Freq: Two times a day (BID) | ORAL | Status: DC
  Administered 2015-09-18 – 2015-09-20 (×6): 25 mg via ORAL
  Filled 2015-09-18 (×7): qty 1

## 2015-09-18 NOTE — Progress Notes (Signed)
At approximately 0445, received call from Central Telemetry advising that patient was in SVT at 180's.  Upon assessment of patient, she is up to bathroom.  Assisted back to bed.  No c/o chest pain or shortness of breath.  She did feel like her heart was racing "a little bit".  VS are 124/63 and HR is now 117.  Called and made [facility] aware.  If HR is still in 120s or greater after approximately 20 minutes back to bed, I am to call [facility] and make them aware.  Will continue to monitor patient.  [employer] MinorKimberly Cameron Katayama RN-BC, WTA.

## 2015-09-18 NOTE — Progress Notes (Signed)
Progress Note   [name] WUJ:[id number] DOB: [birth year] DOA: 09/08/2015 PCP: Sophia Boston, MD   Brief Narrative:   [name] is an 60 y.o. female with a PMH of hypertension and insulin-dependent diabetes was transferred from [facility] for evaluation of acute renal failure and associated hyperkalemia. Patient was recently hospitalized 08/07/15-08/15/15 for treatment of sepsis secondary to necrotizing fasciitis. She was discharged home on clindamycin. A follow-up check of her creatinine on 09/05/15 revealed marked elevation of 15.7 (1.16 at discharge).   Subjective:   [name] denies nausea.  AppetiteOkay. No vomiting.  Had a loose stool today. Denies pain.     Assessment/Plan:   Acute renal failure (ARF) (HCC) Admission creatinine 18.71. Creatinine last noted to be 1.16 at her prior hospital discharge. Nephrology consultation performed 09/09/15 and the patient was subsequently taken for hemodialysis.  Urinalysis negative for casts. Renal ultrasound negative for hydronephrosis. Etiology presumed to be ATN. Prognosis for recovery of renal function felt to be good. Continue HD support until renal function recovers.  Enterobacter and group B strep UTI Unclear if this was colonization versus a true UTI. Afebrile but WBC elevated. Completed treatment with Rocephin .   Necrotizing fasciitis (HCC) of R. inner thigh Patient has a large open wound on her medial right thigh after debridement last month. She has been receiving wound care through home health services. She completed a course of clindamycin. One set of blood cultures was drawn on admission. Wound care nurse evaluated patient 09/09/15 and placed dressing change orders. Wound seen by surgery, recommends ongoing W--->D dressing changes, F/U with Dr. Sheliah [name] which was arranged.    IDDM (insulin dependent diabetes mellitus) (HCC) Home medications on hold including [place]. Hemoglobin A1c 10% on  09/05/15 indicating poor outpatient glycemic control. Currently being managed with 8 units of Lantus and moderate scale SSI 3 times a day. May need to titrate insulin up as renal recovery ensues.  CBG (last 3)   Recent Labs  09/17/15 1708 09/17/15 2036 09/18/15 0757  GLUCAP 91 145* 111*    History of hypertension Metoprolol on hold. Blood pressure currently controlled off metoprolol.  Hyperphosphatemia Phosphorus normalized with hemodialysis and treatment with Fosrenol.  Generalized exfoliative dermatitis This appears to be new since her recent hospital discharge, and is concerning for a possible adverse reaction.  Normocytic anemia- AOCD Consistent with usual baseline values. Likely anemia of chronic disease. Ferriheme given per Renal.  SVT morning of 09/18/2015. She was asymptomatic, check TSH and echogram, place on low-dose beta blocker. She is symptom free. Will monitor.  No results found for: TSH    DVT Prophylaxis SQ Heparin.   Family Communication/Anticipated D/C date and plan/Code Status   Family Communication: No family currently at the bedside.Told patient to have the nurse page me when her family arrives. Disposition Plan/date: Home when renal function stable. PT recommends discharge home with intermittent supervision, home health PT and a 3 in 1. Code Status: Full code.   Procedures and diagnostic studies:   US Renal  09/09/2015  CLINICAL DATA:  Acute onset of renal insufficiency. Initial encounter. EXAM: RENAL / URINARY TRACT ULTRASOUND COMPLETE COMPARISON:  CT of the pelvis performed 08/07/2015 FINDINGS: Right Kidney: Length: 11.3 cm. Echogenicity within normal limits. No mass or hydronephrosis visualized. Left Kidney: Length: 11.3 cm. Echogenicity within normal limits. No mass or hydronephrosis visualized. Bladder: Appears normal for degree of bladder distention. IMPRESSION: Unremarkable renal ultrasound. Electronically Signed   By: [name].D.  On:  09/09/2015 05:38   Dg Chest Port 1 View  09/09/2015  CLINICAL DATA:  Hypertension, diabetes, acute renal failure EXAM: PORTABLE CHEST 1 VIEW COMPARISON:  08/10/2015 FINDINGS: Right IJ temporary dialysis catheter tip at the level of the mid SVC. Exam is slightly rotated to the left. Low lung volumes evident. Heart is enlarged with central vascular congestion. Minor basilar atelectasis. No effusion or pneumothorax. IMPRESSION: Right IJ temporary dialysis catheter tip mid SVC level. Cardiomegaly with vascular congestion Low lung volumes and basilar atelectasis Electronically Signed   By: Sophia PetitM.  Shick M.D.   On: 09/09/2015 14:46    Medical Consultants:    Nephrology: Dr. Otho [name]  Anti-Infectives:   Anti-infectives    Start     Dose/Rate Route Frequency Ordered Stop   09/12/15 1000  cefTRIAXone (ROCEPHIN) 1 g in dextrose 5 % 50 mL IVPB  Status:  Discontinued     1 g 100 mL/hr over 30 Minutes Intravenous Every 24 hours 09/12/15 0905 09/15/15 1544       Objective:    Filed Vitals:   09/17/15 1709 09/18/15 0500 09/18/15 0754 09/18/15 0804  BP: 144/78 124/63 92/70 119/57  Pulse: 102 117 114 98  Temp: 99.1 F (37.3 C) 99.1 F (37.3 C) 99.1 F (37.3 C)   TempSrc: Oral Oral Oral   Resp: 19 18 18    Height:      Weight:      SpO2: 100% 100% 100%     Intake/Output Summary (Last 24 hours) at 09/18/15 0934 Last data filed at 09/18/15 0600  Gross per 24 hour  Intake    960 ml  Output   1350 ml  Net   -390 ml   Filed Weights   09/16/15 0627 09/16/15 1012 09/16/15 2107  Weight: 97.1 kg (214 lb 1.1 oz) 97.1 kg (214 lb 1.1 oz) 97.297 kg (214 lb 8 oz)    Exam: Gen: Alert, NAD Cardiovascular:  Tachycardic, No M/R/G Respiratory:  Diminished but clear Gastrointestinal:  Abdomen soft, NT/ND, + BS Extremities:  Right thigh dressing intact R IJ Dialysis Catheter Extremely dry exfoliating skin which she says has been present prior to admission  Data Reviewed:    Labs: Basic  Metabolic Panel:  Recent Labs Lab 09/13/15 0500 09/14/15 0357 09/15/15 0445 09/16/15 0524 09/17/15 0527 09/18/15 0525  NA 143 141 138 144 145 148*  K 3.9 3.7 4.5 3.6 3.9 3.7  CL 105 107 102 108 106 107  CO2 26 23 25 26 29 30   GLUCOSE 94 154* 180* 130* 129* 115*  BUN 21* 26* 15 17 7 11   CREATININE 3.52* 3.65* 2.98* 2.91* 2.18* 2.62*  CALCIUM 8.0* 7.8* 7.5* 7.9* 7.9* 8.4*  PHOS 4.0 3.3 3.4 3.5  --  3.2   GFR Estimated Creatinine Clearance: 25.4 mL/min (by C-G formula based on Cr of 2.62). Liver Function Tests:  Recent Labs Lab 09/13/15 0500 09/14/15 0357 09/15/15 0445 09/16/15 0524 09/18/15 0525  ALBUMIN 1.5* 1.4* 1.4* 1.4* 1.7*   Coagulation profile No results for input(s): INR, PROTIME in the last 168 hours.  CBC:  Recent Labs Lab 09/12/15 0721 09/16/15 0742  WBC 17.1* 6.1  NEUTROABS 13.7*  --   HGB 7.8* 7.4*  HCT 24.4* 24.1*  MCV 95.3 98.0  PLT 210 216   CBG:  Recent Labs Lab 09/17/15 0821 09/17/15 1149 09/17/15 1708 09/17/15 2036 09/18/15 0757  GLUCAP 96 103* 91 145* 111*   Microbiology Recent Results (from the past 240 hour(s))  MRSA PCR Screening  Status: None   Collection Time: 09/09/15  2:29 AM  Result Value Ref Range Status   MRSA by PCR NEGATIVE NEGATIVE Final    Comment:        The GeneXpert MRSA Assay (FDA approved for NASAL specimens only), is one component of a comprehensive MRSA colonization surveillance program. It is not intended to diagnose MRSA infection nor to guide or monitor treatment for MRSA infections.   Culture, blood (routine x 2)     Status: None   Collection Time: 09/09/15  3:38 AM  Result Value Ref Range Status   Specimen Description BLOOD LEFT ARM  Final   Special Requests BOTTLES DRAWN AEROBIC AND ANAEROBIC  Final   Culture NO GROWTH 5 DAYS  Final   Report Status 09/14/2015 FINAL  Final  Culture, blood (routine x 2)     Status: None   Collection Time: 09/09/15  3:50 AM  Result Value Ref Range  Status   Specimen Description BLOOD LEFT HAND  Final   Special Requests BOTTLES DRAWN AEROBIC ONLY  Final   Culture NO GROWTH 5 DAYS  Final   Report Status 09/14/2015 FINAL  Final  Urine culture     Status: None   Collection Time: 09/09/15  3:08 PM  Result Value Ref Range Status   Specimen Description URINE, RANDOM  Final   Special Requests NONE  Final   Culture   Final    >=100,000 COLONIES/mL ENTEROBACTER AEROGENES >=100,000 COLONIES/mL GROUP B STREP(S.AGALACTIAE)ISOLATED TESTING AGAINST S. AGALACTIAE NOT ROUTINELY PERFORMED DUE TO PREDICTABILITY OF AMP/PEN/VAN SUSCEPTIBILITY.    Report Status 09/11/2015 FINAL  Final   Organism ID, Bacteria ENTEROBACTER AEROGENES  Final      Susceptibility   Enterobacter aerogenes - MIC*    CEFAZOLIN <=4 RESISTANT Resistant     CEFTRIAXONE <=1 SENSITIVE Sensitive     CIPROFLOXACIN <=0.25 SENSITIVE Sensitive     GENTAMICIN <=1 SENSITIVE Sensitive     IMIPENEM <=0.25 SENSITIVE Sensitive     NITROFURANTOIN 64 INTERMEDIATE Intermediate     TRIMETH/SULFA <=20 SENSITIVE Sensitive     PIP/TAZO 16 SENSITIVE Sensitive     * >=100,000 COLONIES/mL ENTEROBACTER AEROGENES     Medications:   . aspirin EC  81 mg Oral Daily  . feeding supplement (NEPRO CARB STEADY)  237 mL Oral BID BM  . FLUoxetine  20 mg Oral BID  . heparin  5,000 Units Subcutaneous 3 times per day  . insulin aspart  0-15 Units Subcutaneous TID WC  . insulin glargine  8 Units Subcutaneous QHS  . metoprolol tartrate  25 mg Oral BID  . saccharomyces boulardii  250 mg Oral BID   Continuous Infusions:    Time spent: 25 minutes.    LOS: 10 days   Signature  Susa Raring K M.D on 09/18/2015 at 9:34 AM  Between 7am to 7pm - Pager - [phone], After 7pm go to [email or web address] - password [facility]  [facility]  - Office  [phone]

## 2015-09-18 NOTE — Progress Notes (Signed)
Physical Therapy Treatment Patient Details Name: [name] MRN: [id number] DOB: 1955/09/01 Today's Date: 09/18/2015    History of Present Illness [name] is an 60 y.o. female with a PMH of hypertension and insulin-dependent diabetes [facility] evaluation of acute renal failure and associated hyperkalemia. Patient was recently hospitalized 08/07/15-08/15/15 for treatment of sepsis secondary to necrotizing fasciitis. She was discharged home on clindamycin. A follow-up check of her creatinine on 09/05/15 revealed marked elevation of 15.7    PT Comments    Pt able to increase her ambulation distance to 160 ft with rw and supervision. Pt is motivated to get back home and is hoping to continue having HHPT. PT to continue to follow and progress mobility as tolerated.   Follow Up Recommendations  Home health PT;Supervision - Intermittent     Equipment Recommendations  3in1 (PT)    Recommendations for Other Services       Precautions / Restrictions Precautions Precautions: Fall Precaution Comments: R thigh and sacral wound Restrictions Weight Bearing Restrictions: No    Mobility  Bed Mobility Overal bed mobility: Modified Independent Bed Mobility: Supine to Sit;Sit to Supine     Supine to sit: Modified independent (Device/Increase time) (rail assist) Sit to supine: Modified independent (Device/Increase time) (rail assist)   General bed mobility comments: No assistance needed with bed mobility  Transfers Overall transfer level: Needs assistance Equipment used: Rolling walker (2 wheeled) Transfers: Sit to/from Stand Sit to Stand: Supervision         General transfer comment: supervision for safety.   Ambulation/Gait Ambulation/Gait assistance: Supervision Ambulation Distance (Feet): 160 Feet Assistive device: Rolling walker (2 wheeled) Gait Pattern/deviations: Step-through pattern Gait velocity: decreased   General  Gait Details: Decreased speed but no loss of balance.    Stairs            Wheelchair Mobility    Modified Rankin (Stroke Patients Only)       Balance Overall balance assessment: Needs assistance Sitting-balance support: No upper extremity supported Sitting balance-Leahy Scale: Good     Standing balance support: No upper extremity supported Standing balance-Leahy Scale: Fair Standing balance comment: using rw for ambulation                    Cognition Arousal/Alertness: Awake/alert Behavior During Therapy: WFL for tasks assessed/performed Overall Cognitive Status: Within Functional Limits for tasks assessed                      Exercises      General Comments General comments (skin integrity, edema, etc.): HR 109 during ambulation      Pertinent Vitals/Pain Pain Assessment: Faces Faces Pain Scale: Hurts little more Pain Location: buttock/thigh Pain Descriptors / Indicators: Sore Pain Intervention(s): Monitored during session    Home Living                      Prior Function            PT Goals (current goals can now be found in the care plan section) Acute Rehab PT Goals Patient Stated Goal: To go home PT Goal Formulation: With patient Time For Goal Achievement: 09/20/15 Potential to Achieve Goals: Good Progress towards PT goals: Progressing toward goals    Frequency  Min 3X/week    PT Plan Current plan remains appropriate    Co-evaluation             End of  Session Equipment Utilized During Treatment: Gait belt Activity Tolerance: Patient tolerated treatment well Patient left: in bed;with call bell/phone within reach;with bed alarm set     Time: 1610-9604 PT Time Calculation (min) (ACUTE ONLY): 15 min  Charges:  $Gait Training: 8-22 mins                    G Codes:      Christiane Ha, PT, CSCS Pager [phone] Office [phone]  09/18/2015, 12:10 PM

## 2015-09-18 NOTE — Progress Notes (Signed)
Subjective:  1300 of urine output recorded Objective Vital signs in last 24 hours: Filed Vitals:   09/17/15 1709 09/18/15 0500 09/18/15 0754 09/18/15 0804  BP: 144/78 124/63 92/70 119/57  Pulse: 102 117 114 98  Temp: 99.1 F (37.3 C) 99.1 F (37.3 C) 99.1 F (37.3 C)   TempSrc: Oral Oral Oral   Resp: 19 18 18    Height:      Weight:      SpO2: 100% 100% 100%    Weight change:   Intake/Output Summary (Last 24 hours) at 09/18/15 [id number] Last data filed at 09/18/15 0600  Gross per 24 hour  Intake   1200 ml  Output   1350 ml  Net   -150 ml    Assessment/ Plan: Pt is a 60 y.o. yo female who was admitted on 09/08/2015 with AKI - thought ATN secondary to hypotension/ACE Assessment/Plan: 1. Renal- baseline renal function normal. AK I secondary to likely ATN due to hypotension/ACE has been dialysis requiring since 4/1. Last HD was on 4/8. Urine output is picking up. No indications for dialysis today. We'll continue to follow urine output and labs closely 2. Anemia- anemia with iron deficiency. Status post Feraheme- no ESA. Hemoglobin low but stable 4. ID- status post fasciitis- off antibiotics 5. HTN/volume- hemodynamics in good at this time. She is on metoprolol as well as Lasix- will stop Lasix as is not volume overloaded 6. Hyperphosphatemia- on Fosrenol-  phosphorus is good will hold  Sema Stangler A    Labs: Basic Metabolic Panel:  Recent Labs Lab 09/15/15 0445 09/16/15 0524 09/17/15 0527 09/18/15 0525  NA 138 144 145 148*  K 4.5 3.6 3.9 3.7  CL 102 108 106 107  CO2 25 26 29 30   GLUCOSE 180* 130* 129* 115*  BUN 15 17 7 11   CREATININE 2.98* 2.91* 2.18* 2.62*  CALCIUM 7.5* 7.9* 7.9* 8.4*  PHOS 3.4 3.5  --  3.2   Liver Function Tests:  Recent Labs Lab 09/15/15 0445 09/16/15 0524 09/18/15 0525  ALBUMIN 1.4* 1.4* 1.7*   No results for input(s): LIPASE, AMYLASE in the last 168 hours. No results for input(s): AMMONIA in the last 168 hours. CBC:  Recent  Labs Lab 09/12/15 0721 09/16/15 0742  WBC 17.1* 6.1  NEUTROABS 13.7*  --   HGB 7.8* 7.4*  HCT 24.4* 24.1*  MCV 95.3 98.0  PLT 210 216   Cardiac Enzymes: No results for input(s): CKTOTAL, CKMB, CKMBINDEX, TROPONINI in the last 168 hours. CBG:  Recent Labs Lab 09/17/15 0821 09/17/15 1149 09/17/15 1708 09/17/15 2036 09/18/15 0757  GLUCAP 96 103* 91 145* 111*    Iron Studies: No results for input(s): IRON, TIBC, TRANSFERRIN, FERRITIN in the last 72 hours. Studies/Results: No results found. Medications: Infusions:    Scheduled Medications: . aspirin EC  81 mg Oral Daily  . feeding supplement (NEPRO CARB STEADY)  237 mL Oral BID BM  . FLUoxetine  20 mg Oral BID  . furosemide  160 mg Oral BID  . heparin  5,000 Units Subcutaneous 3 times per day  . insulin aspart  0-15 Units Subcutaneous TID WC  . insulin glargine  8 Units Subcutaneous QHS  . lanthanum  1,000 mg Oral TID WC  . metoprolol tartrate  25 mg Oral BID  . saccharomyces boulardii  250 mg Oral BID    have reviewed scheduled and prn medications.  Physical Exam: General: Alert, wants to go home Heart: Slightly tachycardic Lungs: Clear Abdomen: Obese, soft, nontender Extremities:  No edema Dialysis Access: Right sided IJ Vas-Cath    09/18/2015,9:21 AM  LOS: 10 days

## 2015-09-19 ENCOUNTER — Inpatient Hospital Stay (HOSPITAL_COMMUNITY): Payer: BLUE CROSS/BLUE SHIELD

## 2015-09-19 DIAGNOSIS — I361 Nonrheumatic tricuspid (valve) insufficiency: Secondary | ICD-10-CM

## 2015-09-19 LAB — GLUCOSE, CAPILLARY
GLUCOSE-CAPILLARY: 120 mg/dL — AB (ref 65–99)
GLUCOSE-CAPILLARY: 150 mg/dL — AB (ref 65–99)
Glucose-Capillary: 92 mg/dL (ref 65–99)
Glucose-Capillary: 191 mg/dL — ABNORMAL HIGH (ref 65–99)

## 2015-09-19 LAB — RENAL FUNCTION PANEL
Albumin: 1.8 g/dL — ABNORMAL LOW (ref 3.5–5.0)
Anion gap: 11 (ref 5–15)
BUN: 15 mg/dL (ref 6–20)
CHLORIDE: 106 mmol/L (ref 101–111)
CO2: 29 mmol/L (ref 22–32)
Calcium: 8.5 mg/dL — ABNORMAL LOW (ref 8.9–10.3)
Creatinine, Ser: 3.1 mg/dL — ABNORMAL HIGH (ref 0.44–1.00)
GFR calc Af Amer: 18 mL/min — ABNORMAL LOW (ref >60)
GFR calc non Af Amer: 15 mL/min — ABNORMAL LOW (ref >60)
GLUCOSE: 148 mg/dL — AB (ref 65–99)
POTASSIUM: 3.9 mmol/L (ref 3.5–5.1)
Phosphorus: 3 mg/dL (ref 2.5–4.6)
Sodium: 146 mmol/L — ABNORMAL HIGH (ref 135–145)

## 2015-09-19 LAB — ECHOCARDIOGRAM COMPLETE
HEIGHTINCHES: 63 in
Weight: 3432 oz

## 2015-09-19 NOTE — Progress Notes (Signed)
Progress Note   [name] ZOX:[id number] DOB: 03/28/1956 DOA: 09/08/2015 PCP: Sophia BostonAPPER,[name], MD   Brief Narrative:   [name] is an 60 y.o. female with a PMH of hypertension and insulin-dependent diabetes was [facility] for evaluation of acute renal failure and associated hyperkalemia. Patient was recently hospitalized 08/07/15-08/15/15 for treatment of sepsis secondary to necrotizing fasciitis. She was discharged home on clindamycin. A follow-up check of her creatinine on 09/05/15 revealed marked elevation of 15.7 (1.16 at discharge).   Subjective:   [name] Is sitting in recliner, denies any headache, no chest or abdominal pain, no shortness of breath, dry skin feels better, she has no subjective complaints. She is little concerned about her daughter who had an accident. Days ago.  Assessment/Plan:   Acute renal failure (ARF) (HCC) Admission creatinine 18.71. Creatinine last noted to be 1.16 at her prior hospital discharge. Nephrology consultation performed 09/09/15 and the patient was subsequently taken for hemodialysis.  Urinalysis negative for casts. Renal ultrasound negative for hydronephrosis. Etiology presumed to be ATN. Prognosis for recovery of renal function felt to be good. Continue HD support until renal function recovers. Discussed with [name] on 09/19/2015. Continue inpatient care.  Enterobacter and group [name] strep UTI She completed her treatment with Rocephin.  Necrotizing fasciitis (HCC) of R. inner thigh Patient has a large open wound on her medial right thigh after debridement last month. She has been receiving wound care through home health services. She completed a course of clindamycin. One set of blood cultures was drawn on admission. Wound care nurse evaluated patient 09/09/15 and placed dressing change orders. Wound seen by surgery, recommends ongoing W--->D dressing changes, F/U with [name] which  was arranged.    IDDM (insulin dependent diabetes mellitus) (HCC) Home medications on hold including [place]Farxiga. Hemoglobin A1c 10% on 09/05/15 indicating poor outpatient glycemic control. Currently being managed with 8 units of Lantus and moderate scale SSI 3 times a day. May need to titrate insulin up as renal recovery ensues.  CBG (last 3)   Recent Labs  09/18/15 1720 09/18/15 2159 09/19/15 0735  GLUCAP 224* 99 120*    History of hypertension Stable on beta blocker.  Hyperphosphatemia Phosphorus normalized with hemodialysis and treatment with Fosrenol.  Generalized exfoliative dermatitis This appears to be new since her recent hospital discharge, and is concerning for a possible adverse reaction. Supportive care with skin moisturizers she is much better.  Normocytic anemia- AOCD Consistent with usual baseline values. Likely anemia of chronic disease. Ferriheme given per Renal.  SVT morning of 09/18/2015. She was asymptomatic,  TSH is stable, pending echogram, placed on low-dose beta blocker. She is symptom free, and remained stable on telemetry.  Lab Results  Component Value Date   TSH 1.751 09/18/2015      DVT Prophylaxis SQ Heparin.   Family Communication/Anticipated D/C date and plan/Code Status   Family Communication: No family currently at the bedside.Told patient to have the nurse page me when her family arrives. Disposition Plan/date: Home when renal function stable. PT recommends discharge home with intermittent supervision, home health PT and a 3 in 1. Code Status: Full code.   Procedures and diagnostic studies:   [place] Renal  09/09/2015  CLINICAL DATA:  Acute onset of renal insufficiency. Initial encounter. EXAM: RENAL / URINARY TRACT ULTRASOUND COMPLETE COMPARISON:  CT of the pelvis performed 08/07/2015 FINDINGS: Right Kidney: Length: 11.3 cm. Echogenicity within normal limits. No mass or hydronephrosis visualized. Left  Kidney: Length: 11.3 cm. Echogenicity  within normal limits. No mass or hydronephrosis visualized. Bladder: Appears normal for degree of bladder distention. IMPRESSION: Unremarkable renal ultrasound. Electronically Signed   By: [name] M.D.   On: 09/09/2015 05:38   Dg Chest Port 1 View  09/09/2015  CLINICAL DATA:  Hypertension, diabetes, acute renal failure EXAM: PORTABLE CHEST 1 VIEW COMPARISON:  08/10/2015 FINDINGS: Right IJ temporary dialysis catheter tip at the level of the mid SVC. Exam is slightly rotated to the left. Low lung volumes evident. Heart is enlarged with central vascular congestion. Minor basilar atelectasis. No effusion or pneumothorax. IMPRESSION: Right IJ temporary dialysis catheter tip mid SVC level. Cardiomegaly with vascular congestion Low lung volumes and basilar atelectasis Electronically Signed   By: Judie Petit.  Shick M.D.   On: 09/09/2015 14:46    Medical Consultants:    Nephrology: [name]  Anti-Infectives:   Anti-infectives    Start     Dose/Rate Route Frequency Ordered Stop   09/12/15 1000  cefTRIAXone (ROCEPHIN) 1 g in dextrose 5 % 50 mL IVPB  Status:  Discontinued     1 g 100 mL/hr over 30 Minutes Intravenous Every 24 hours 09/12/15 0905 09/15/15 1544       Objective:    Filed Vitals:   09/18/15 0804 09/18/15 1700 09/18/15 2100 09/19/15 0500  BP: 119/57 116/65 108/65 113/58  Pulse: 98 98 92 81  Temp:  98.4 F (36.9 C) 98.1 F (36.7 C) 98.5 F (36.9 C)  TempSrc:  Oral Oral Oral  Resp:  Height:      Weight:      SpO2:  99% 97% 98%    Intake/Output Summary (Last 24 hours) at 09/19/15 1010 Last data filed at 09/19/15 0906  Gross per 24 hour  Intake    720 ml  Output    452 ml  Net    268 ml   Filed Weights   09/16/15 0627 09/16/15 1012 09/16/15 2107  Weight: 97.1 kg (214 lb 1.1 oz) 97.1 kg (214 lb 1.1 oz) 97.297 kg (214 lb 8 oz)    Exam: Gen: Alert, NAD Cardiovascular:  Tachycardic, No M/R/G Respiratory:  Diminished but clear Gastrointestinal:  Abdomen  soft, NT/ND, + BS Extremities:  Right thigh dressing intact R IJ Dialysis Catheter Extremely dry exfoliating skin which she says has been present prior to admission  Data Reviewed:    Labs: Basic Metabolic Panel:  Recent Labs Lab 09/14/15 0357 09/15/15 0445 09/16/15 0524 09/17/15 0527 09/18/15 0525 09/19/15 0446  NA 141 138 144 145 148* 146*  K 3.7 4.5 3.6 3.9 3.7 3.9  CL 107 102 108 106 107 106  CO2 GLUCOSE 154* 180* 130* 129* 115* 148*  BUN 26* CREATININE 3.65* 2.98* 2.91* 2.18* 2.62* 3.10*  CALCIUM 7.8* 7.5* 7.9* 7.9* 8.4* 8.5*  PHOS 3.3 3.4 3.5  --  3.2 3.0   GFR Estimated Creatinine Clearance: 21.4 mL/min (by C-G formula based on Cr of 3.1). Liver Function Tests:  Recent Labs Lab 09/14/15 0357 09/15/15 0445 09/16/15 0524 09/18/15 0525 09/19/15 0446  ALBUMIN 1.4* 1.4* 1.4* 1.7* 1.8*   Coagulation profile No results for input(s): INR, PROTIME in the last 168 hours.  CBC:  Recent Labs Lab 09/16/15 0742  WBC 6.1  HGB 7.4*  HCT 24.1*  MCV 98.0  PLT 216   CBG:  Recent Labs Lab 09/18/15 0757 09/18/15 1229 09/18/15 1720  09/18/15 2159 09/19/15 0735  GLUCAP 111* 120* 224* 99 120*   Microbiology Recent Results (from the past 240 hour(s))  Urine culture     Status: None   Collection Time: 09/09/15  3:08 PM  Result Value Ref Range Status   Specimen Description URINE, RANDOM  Final   Special Requests NONE  Final   Culture   Final    >=100,000 COLONIES/mL ENTEROBACTER AEROGENES >=100,000 COLONIES/mL GROUP [name] STREP(S.AGALACTIAE)ISOLATED TESTING AGAINST S. AGALACTIAE NOT ROUTINELY PERFORMED DUE TO PREDICTABILITY OF AMP/PEN/VAN SUSCEPTIBILITY.    Report Status 09/11/2015 FINAL  Final   Organism ID, Bacteria ENTEROBACTER AEROGENES  Final      Susceptibility   Enterobacter aerogenes - MIC*    CEFAZOLIN <=4 RESISTANT Resistant     CEFTRIAXONE <=1 SENSITIVE Sensitive     CIPROFLOXACIN <=0.25 SENSITIVE Sensitive      GENTAMICIN <=1 SENSITIVE Sensitive     IMIPENEM <=0.25 SENSITIVE Sensitive     NITROFURANTOIN 64 INTERMEDIATE Intermediate     TRIMETH/SULFA <=20 SENSITIVE Sensitive     PIP/TAZO 16 SENSITIVE Sensitive     * >=100,000 COLONIES/mL ENTEROBACTER AEROGENES     Medications:   . aspirin EC  81 mg Oral Daily  . feeding supplement (NEPRO CARB STEADY)  237 mL Oral BID BM  . FLUoxetine  20 mg Oral BID  . heparin  5,000 Units Subcutaneous 3 times per day  . insulin aspart  0-15 Units Subcutaneous TID WC  . insulin glargine  8 Units Subcutaneous QHS  . metoprolol tartrate  25 mg Oral BID  . saccharomyces boulardii  250 mg Oral BID   Continuous Infusions:    Time spent: 25 minutes.    LOS: 11 days   Signature  Leroy Sea M.D on 09/19/2015 at 10:10 AM  Between 7am to 7pm - Pager - [phone], After 7pm go to [email or web address] - password [facility]  [facility]  - Office  [phone]

## 2015-09-19 NOTE — Progress Notes (Signed)
  Echocardiogram 2D Echocardiogram has been performed.  Arvil ChacoFoster, Velora Horstman 09/19/2015, 4:39 PM

## 2015-09-19 NOTE — Progress Notes (Signed)
Subjective:  Only 300 of urine output recorded but record may not be complete- she says she is making a normal amount.  Tells me that she has to go home- her deaf daughter had MVA and she wants to check on her- creatinine up some from yesterday  Objective Vital signs in last 24 hours: Filed Vitals:   09/18/15 0804 09/18/15 1700 09/18/15 2100 09/19/15 0500  BP: 119/57 116/65 108/65 113/58  Pulse: 98 98 92 81  Temp:  98.4 F (36.9 C) 98.1 F (36.7 C) 98.5 F (36.9 C)  TempSrc:  Oral Oral Oral  Resp:  Height:      Weight:      SpO2:  99% 97% 98%   Weight change:   Intake/Output Summary (Last 24 hours) at 09/19/15 1002 Last data filed at 09/19/15 0906  Gross per 24 hour  Intake    720 ml  Output    452 ml  Net    268 ml    Assessment/ Plan: Pt is a 60 y.o. yo female who was admitted on 09/08/2015 with AKI - thought ATN secondary to hypotension/ACE Assessment/Plan: 1. Renal- baseline renal function normal. AKI secondary to likely ATN due to hypotension/ACE has been dialysis requiring since 4/1. Last HD was on 4/8. Urine output seemed to be picking up. No indications for dialysis today. We'll continue to follow urine output and labs closely- keep access in for now- not sure she is completely out of woods- should declare itself in the next couple of days  2. Anemia- anemia with iron deficiency. Status post Feraheme- no ESA. Hemoglobin low but stable- need to reckeck 4. ID- status post fasciitis- off antibiotics 5. HTN/volume- hemodynamics in good at this time. She is on metoprolol as well as Lasix- will stop Lasix as is not volume overloaded 6. Hyperphosphatemia- previously  on Fosrenol-  Now held   [facility]    Labs: Basic Metabolic Panel:  Recent Labs Lab 09/16/15 0524 09/17/15 0527 09/18/15 0525 09/19/15 0446  NA 144 145 148* 146*  K 3.6 3.9 3.7 3.9  CL 108 106 107 106  CO2 GLUCOSE 130* 129* 115* 148*  BUN CREATININE  2.91* 2.18* 2.62* 3.10*  CALCIUM 7.9* 7.9* 8.4* 8.5*  PHOS 3.5  --  3.2 3.0   Liver Function Tests:  Recent Labs Lab 09/16/15 0524 09/18/15 0525 09/19/15 0446  ALBUMIN 1.4* 1.7* 1.8*   No results for input(s): LIPASE, AMYLASE in the last 168 hours. No results for input(s): AMMONIA in the last 168 hours. CBC:  Recent Labs Lab 09/16/15 0742  WBC 6.1  HGB 7.4*  HCT 24.1*  MCV 98.0  PLT 216   Cardiac Enzymes: No results for input(s): CKTOTAL, CKMB, CKMBINDEX, TROPONINI in the last 168 hours. CBG:  Recent Labs Lab 09/18/15 0757 09/18/15 1229 09/18/15 1720 09/18/15 2159 09/19/15 0735  GLUCAP 111* 120* 224* 99 120*    Iron Studies: No results for input(s): IRON, TIBC, TRANSFERRIN, FERRITIN in the last 72 hours. Studies/Results: No results found. Medications: Infusions:    Scheduled Medications: . aspirin EC  81 mg Oral Daily  . feeding supplement (NEPRO CARB STEADY)  237 mL Oral BID BM  . FLUoxetine  20 mg Oral BID  . heparin  5,000 Units Subcutaneous 3 times per day  . insulin aspart  0-15 Units Subcutaneous TID WC  . insulin glargine  8 Units Subcutaneous QHS  . metoprolol tartrate  25 mg Oral BID  . saccharomyces boulardii  250 mg Oral BID    have reviewed scheduled and prn medications.  Physical Exam: General: Alert, wants to go home Heart: Slightly tachycardic Lungs: Clear Abdomen: Obese, soft, nontender Extremities: No edema Dialysis Access: Right sided IJ Vas-Cath placed 4/1   09/19/2015,10:02 AM  LOS: 11 days

## 2015-09-20 DIAGNOSIS — L538 Other specified erythematous conditions: Secondary | ICD-10-CM

## 2015-09-20 LAB — GLUCOSE, CAPILLARY
GLUCOSE-CAPILLARY: 116 mg/dL — AB (ref 65–99)
GLUCOSE-CAPILLARY: 133 mg/dL — AB (ref 65–99)
Glucose-Capillary: 78 mg/dL (ref 65–99)
Glucose-Capillary: 142 mg/dL — ABNORMAL HIGH (ref 65–99)

## 2015-09-20 LAB — RENAL FUNCTION PANEL
Albumin: 1.7 g/dL — ABNORMAL LOW (ref 3.5–5.0)
Anion gap: 10 (ref 5–15)
BUN: 17 mg/dL (ref 6–20)
CALCIUM: 8.4 mg/dL — AB (ref 8.9–10.3)
CHLORIDE: 108 mmol/L (ref 101–111)
CO2: 31 mmol/L (ref 22–32)
CREATININE: 3.14 mg/dL — AB (ref 0.44–1.00)
GFR calc non Af Amer: 15 mL/min — ABNORMAL LOW (ref >60)
GFR, EST AFRICAN AMERICAN: 17 mL/min — AB (ref >60)
Glucose, Bld: 144 mg/dL — ABNORMAL HIGH (ref 65–99)
PHOSPHORUS: 3.4 mg/dL (ref 2.5–4.6)
Potassium: 3.3 mmol/L — ABNORMAL LOW (ref 3.5–5.1)
Sodium: 149 mmol/L — ABNORMAL HIGH (ref 135–145)

## 2015-09-20 LAB — CBC
HCT: 25.3 % — ABNORMAL LOW (ref 36.0–46.0)
Hemoglobin: 7.6 g/dL — ABNORMAL LOW (ref 12.0–15.0)
MCH: 29.7 pg (ref 26.0–34.0)
MCHC: 30 g/dL (ref 30.0–36.0)
MCV: 98.8 fL (ref 78.0–100.0)
PLATELETS: 331 10*3/uL (ref 150–400)
RBC: 2.56 MIL/uL — ABNORMAL LOW (ref 3.87–5.11)
RDW: 15.4 % (ref 11.5–15.5)
WBC: 8.6 10*3/uL (ref 4.0–10.5)

## 2015-09-20 MED ORDER — DIPHENHYDRAMINE HCL 50 MG/ML IJ SOLN: 12.5000 mg | Freq: Four times a day (QID) | INTRAMUSCULAR | Status: DC | PRN

## 2015-09-20 MED ORDER — POTASSIUM CHLORIDE CRYS ER 20 MEQ PO TBCR
20.0000 meq | EXTENDED_RELEASE_TABLET | Freq: Once | ORAL | Status: AC
  Administered 2015-09-20: 20 meq via ORAL
  Filled 2015-09-20: qty 1

## 2015-09-20 MED ORDER — DARBEPOETIN ALFA 100 MCG/0.5ML IJ SOSY
100.0000 ug | PREFILLED_SYRINGE | INTRAMUSCULAR | Status: DC
  Filled 2015-09-20: qty 0.5

## 2015-09-20 MED ORDER — DIPHENHYDRAMINE HCL 25 MG PO CAPS
25.0000 mg | ORAL_CAPSULE | Freq: Four times a day (QID) | ORAL | Status: DC | PRN
  Administered 2015-09-20: 25 mg via ORAL
  Filled 2015-09-20: qty 1

## 2015-09-20 NOTE — Progress Notes (Addendum)
Physical Therapy Treatment Patient Details Name: [name] MRN: [id number] DOB: Feb 23, 1956 Today's Date: 09/20/2015    History of Present Illness [name] is an 60 y.o. female with a PMH of hypertension and insulin-dependent diabetes was transferred from [facility] for evaluation of acute renal failure and associated hyperkalemia. Patient was recently hospitalized 08/07/15-08/15/15 for treatment of sepsis secondary to necrotizing fasciitis. She was discharged home on clindamycin. A follow-up check of her creatinine on 09/05/15 revealed marked elevation of 15.7    PT Comments    Steady with gait, no loss of balance noted, VSS throughout. States she feels confident that she will manage safely at home. Will continue to follow and progress until d/c.  Follow Up Recommendations  Home health PT;Supervision - Intermittent     Equipment Recommendations  3in1 (PT)    Recommendations for Other Services       Precautions / Restrictions Precautions Precautions: Fall Precaution Comments: R thigh and sacral wound Restrictions Weight Bearing Restrictions: No    Mobility  Bed Mobility               General bed mobility comments: sitting in recliner.  Transfers Overall transfer level: Modified independent Equipment used: Rolling walker (2 wheeled)             General transfer comment: Good power up and balance.   Ambulation/Gait Ambulation/Gait assistance: Supervision Ambulation Distance (Feet): 160 Feet Assistive device: Rolling walker (2 wheeled) Gait Pattern/deviations: Step-through pattern Gait velocity: decreased Gait velocity interpretation: Below normal speed for age/gender General Gait Details: Decreased speed, good stability without loss of balance noted today. Good control of RW. Cues for larger stride length and safety awareness. HR 106, no dyspnea noted.   Stairs            Wheelchair Mobility    Modified Rankin (Stroke  Patients Only)       Balance                                    Cognition Arousal/Alertness: Awake/alert Behavior During Therapy: WFL for tasks assessed/performed Overall Cognitive Status: Within Functional Limits for tasks assessed                      Exercises      General Comments General comments (skin integrity, edema, etc.): Further treatment limited due to patient taking a phone call that was excessive in time.      Pertinent Vitals/Pain Pain Assessment: No/denies pain Faces Pain Scale: Hurts little more Pain Intervention(s): Monitored during session    Home Living                      Prior Function            PT Goals (current goals can now be found in the care plan section) Acute Rehab PT Goals Patient Stated Goal: To go home PT Goal Formulation: With patient Time For Goal Achievement: 09/27/15 Potential to Achieve Goals: Good Progress towards PT goals: Progressing toward goals    Frequency  Min 3X/week    PT Plan Current plan remains appropriate    Co-evaluation             End of Session Equipment Utilized During Treatment: Gait belt Activity Tolerance: Patient tolerated treatment well Patient left: with call bell/phone within reach;in chair;with chair alarm set     Time:  [id number] PT Time Calculation (min) (ACUTE ONLY): 10 min  Charges:  $Gait Training: 8-22 mins                    G Codes:      [place], Ashlay Altieri S 09/20/2015, 12:41 PM  Sunday SpillersLogan Secor [place], [place] [phone]

## 2015-09-20 NOTE — Clinical Documentation Improvement (Signed)
Internal Medicine   Noted nutritional evaluation on 09/16/15, please provide a diagnosis if appropriate. Thank you   Document Severity - Severe(third degree), Moderate (second degree), Mild (first degree)  Form - [place] (rarely seen in the [place] Marasmus, Other Condition, Unable to Determine  Other condition  Unable to clinically determine   Supporting Information:   "Severe malnutrition in context of acute illness/injury, Obesity unspecified   Pt meets criteria for SEVERE MALNUTRITION in the context of ACUTE ILLNESS as evidenced by Loss of >5% bw in 1 month and an oral intake that is estimated to have met < or equal to 50% of her needs for > or equal to 5 days. "   Please exercise your independent, professional judgment when responding. A specific answer is not anticipated or expected.   Thank You, [place] [phone]

## 2015-09-20 NOTE — Progress Notes (Signed)
Progress Note   [name] ZOX:[id number] DOB: [birth year] DOA: 09/08/2015 PCP: [name], MD   Brief Narrative:   [name] is an 60 y.o. female with a PMH of hypertension and insulin-dependent diabetes was transferred from [facility] for evaluation of acute renal failure and associated hyperkalemia. Patient was recently hospitalized 08/07/15-08/15/15 for treatment of sepsis secondary to necrotizing fasciitis. She was discharged home on clindamycin. A follow-up check of her creatinine on 09/05/15 revealed marked elevation of 15.7 (1.16 at discharge).  She is still oliguric, has received dialysis multiple times this admission. Renal monitoring. Once renal function stabilizes she will be discharged.   Subjective:   [name] Is sitting in recliner, denies any headache, no chest or abdominal pain, no shortness of breath, dry skin feels better, she has no subjective complaints.Feels better overall.  Assessment/Plan:   Acute renal failure (ARF) (HCC) Admission creatinine 18.71. Creatinine last noted to be 1.16 at her prior hospital discharge. Nephrology consultation performed 09/09/15 and the patient was subsequently taken for hemodialysis.  Urinalysis negative for casts. Renal ultrasound negative for hydronephrosis. Etiology presumed to be ATN. Prognosis for recovery of renal function felt to be good. Continue HD support until renal function recovers. Discussed with Dr. Lacy Duverney on 09/19/2015, for now to stay inpatient. Unfortunately even though her potassium and creatinine are stable and she remains oliguric, will defer management to renal.  Enterobacter and group B strep UTI She completed her treatment with Rocephin.  Necrotizing fasciitis (HCC) of R. inner thigh Patient has a large open wound on her medial right thigh after debridement last month. She has been receiving wound care through home health services. She completed a course of  clindamycin. One set of blood cultures was drawn on admission. Wound care nurse evaluated patient 09/09/15 and placed dressing change orders. Wound seen by surgery, recommends ongoing W--->D dressing changes, F/U with Dr. Sheliah Hatch which was arranged.    IDDM (insulin dependent diabetes mellitus) (HCC) Home medications on hold including [place]. Hemoglobin A1c 10% on 09/05/15 indicating poor outpatient glycemic control. Currently being managed with 8 units of Lantus and moderate scale SSI 3 times a day. May need to titrate insulin up as renal recovery ensues.  CBG (last 3)   Recent Labs  09/19/15 1644 09/19/15 2205 09/20/15 0729  GLUCAP 191* 150* 116*    History of hypertension Stable on beta blocker.  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  Generalized exfoliative dermatitis This appears to be new since her recent hospital discharge, and is concerning for a possible adverse reaction. Supportive care with skin moisturizers she is much better.  Normocytic anemia- AOCD Consistent with usual baseline values. Likely anemia of chronic disease. Ferriheme given per Renal.  SVT morning of 09/18/2015. She was asymptomatic,  TSH is stable, stable echogram With EF of 60% and mild chronic diastolic dysfunction, placed on low-dose beta blocker. She is symptom free, and remaines stable on telemetry.  Lab Results  Component Value Date   TSH 1.751 09/18/2015      DVT Prophylaxis SQ Heparin.   Family Communication/Anticipated D/C date and plan/Code Status   Family Communication: No family currently at the bedside.Told patient to have the nurse page me when her family arrives. Disposition Plan/date: Home when renal function stable. PT recommends discharge home with intermittent supervision, home health PT and a 3 in 1. Code Status: Full code.   Procedures and diagnostic studies:  TTE  Incomplete study.  LVEF 60-65%, mild LVH, normal wall motion,  diastolic dysfunction, indeterminate LV filling pressure, trivail MR, normal LA size, mild TR, RVSP 31 mmHg + RAP.   [place] Renal  09/09/2015  CLINICAL DATA:  Acute onset of renal insufficiency. Initial encounter. EXAM: RENAL / URINARY TRACT ULTRASOUND COMPLETE COMPARISON:  CT of the pelvis performed 08/07/2015 FINDINGS: Right Kidney: Length: 11.3 cm. Echogenicity within normal limits. No mass or hydronephrosis visualized. Left Kidney: Length: 11.3 cm. Echogenicity within normal limits. No mass or hydronephrosis visualized. Bladder: Appears normal for degree of bladder distention. IMPRESSION: Unremarkable renal ultrasound. Electronically Signed   By: Roanna RaiderJeffery  Chang M.D.   On: 09/09/2015 05:38   Dg Chest Port 1 View  09/09/2015  CLINICAL DATA:  Hypertension, diabetes, acute renal failure EXAM: PORTABLE CHEST 1 VIEW COMPARISON:  08/10/2015 FINDINGS: Right IJ temporary dialysis catheter tip at the level of the mid SVC. Exam is slightly rotated to the left. Low lung volumes evident. Heart is enlarged with central vascular congestion. Minor basilar atelectasis. No effusion or pneumothorax. IMPRESSION: Right IJ temporary dialysis catheter tip mid SVC level. Cardiomegaly with vascular congestion Low lung volumes and basilar atelectasis Electronically Signed   By: Judie PetitM.  Shick M.D.   On: 09/09/2015 14:46    Medical Consultants:    Nephrology: Dr. Otho Bellowsetterding  Anti-Infectives:   Anti-infectives    Start     Dose/Rate Route Frequency Ordered Stop   09/12/15 1000  cefTRIAXone (ROCEPHIN) 1 g in dextrose 5 % 50 mL IVPB  Status:  Discontinued     1 g 100 mL/hr over 30 Minutes Intravenous Every 24 hours 09/12/15 0905 09/15/15 1544       Objective:    Filed Vitals:   09/19/15 1100 09/19/15 1622 09/19/15 2004 09/20/15 0447  BP: 109/60 119/55 127/64 121/69  Pulse: 81 83 93 90  Temp: 99 F (37.2 C) 98.9 F (37.2 C) 99.2 F (37.3 C) 98.9 F (37.2 C)  TempSrc: Oral  Oral Oral Oral  Resp:  16 16 18   Height:      Weight:   90.719 kg (200 lb)   SpO2:  98% 98% 95%    Intake/Output Summary (Last 24 hours) at 09/20/15 0944 Last data filed at 09/19/15 1823  Gross per 24 hour  Intake      0 ml  Output    100 ml  Net   -100 ml   Filed Weights   09/16/15 1012 09/16/15 2107 09/19/15 2004  Weight: 97.1 kg (214 lb 1.1 oz) 97.297 kg (214 lb 8 oz) 90.719 kg (200 lb)    Exam: Gen: Alert, NAD Cardiovascular:  Tachycardic, No M/R/G Respiratory:  Diminished but clear Gastrointestinal:  Abdomen soft, NT/ND, + BS Extremities:  Right thigh dressing intact R IJ Dialysis Catheter Extremely dry exfoliating skin which she says has been present prior to admission  Data Reviewed:    Labs: Basic Metabolic Panel:  Recent Labs Lab 09/15/15 0445 09/16/15 0524 09/17/15 0527 09/18/15 0525 09/19/15 0446 09/20/15 0455  NA 138 144 145 148* 146* 149*  K 4.5 3.6 3.9 3.7 3.9 3.3*  CL 102 108 106 107 106 108  CO2 25 26 29 30 29 31   GLUCOSE 180* 130* 129* 115* 148* 144*  BUN 15 17 7 11 15 17   CREATININE 2.98* 2.91* 2.18* 2.62* 3.10* 3.14*  CALCIUM 7.5* 7.9* 7.9* 8.4* 8.5* 8.4*  PHOS 3.4 3.5  --  3.2 3.0 3.4   GFR Estimated Creatinine Clearance: 20.4 mL/min (  by C-G formula based on Cr of 3.14). Liver Function Tests:  Recent Labs Lab 09/15/15 0445 09/16/15 0524 09/18/15 0525 09/19/15 0446 09/20/15 0455  ALBUMIN 1.4* 1.4* 1.7* 1.8* 1.7*   Coagulation profile No results for input(s): INR, PROTIME in the last 168 hours.  CBC:  Recent Labs Lab 09/16/15 0742 09/20/15 0455  WBC 6.1 8.6  HGB 7.4* 7.6*  HCT 24.1* 25.3*  MCV 98.0 98.8  PLT 216 331   CBG:  Recent Labs Lab 09/19/15 0735 09/19/15 1159 09/19/15 1644 09/19/15 2205 09/20/15 0729  GLUCAP 120* 92 191* 150* 116*   Microbiology No results found for this or any previous visit (from the past 240 hour(s)).   Medications:   . aspirin EC  81 mg Oral Daily  . feeding supplement  (NEPRO CARB STEADY)  237 mL Oral BID BM  . FLUoxetine  20 mg Oral BID  . heparin  5,000 Units Subcutaneous 3 times per day  . insulin aspart  0-15 Units Subcutaneous TID WC  . insulin glargine  8 Units Subcutaneous QHS  . metoprolol tartrate  25 mg Oral BID  . potassium chloride  20 mEq Oral Once  . saccharomyces boulardii  250 mg Oral BID   Continuous Infusions:    Time spent: 25 minutes.    LOS: 12 days   Signature  Leroy Sea M.D on 09/20/2015 at 9:44 AM  Between 7am to 7pm - Pager - [phone], After 7pm go to [email or web address] - password [facility]  [facility]  - Office  [phone]

## 2015-09-20 NOTE — Progress Notes (Signed)
Subjective:  Only 250 of urine output recorded but record not complete- she says she is making a normal amount.  She was able to get her daughter checked on- doing OK  Objective Vital signs in last 24 hours: Filed Vitals:   09/19/15 1100 09/19/15 1622 09/19/15 2004 09/20/15 0447  BP: 109/60 119/55 127/64 121/69  Pulse: 81 83 93 90  Temp: 99 F (37.2 C) 98.9 F (37.2 C) 99.2 F (37.3 C) 98.9 F (37.2 C)  TempSrc: Oral Oral Oral Oral  Resp:  16 16 18   Height:      Weight:   90.719 kg (200 lb)   SpO2:  98% 98% 95%   Weight change:   Intake/Output Summary (Last 24 hours) at 09/20/15 1017 Last data filed at 09/19/15 1823  Gross per 24 hour  Intake      0 ml  Output    100 ml  Net   -100 ml    Assessment/ Plan: Pt is a 60 y.o. yo female who was admitted on 09/08/2015 with AKI - thought ATN secondary to hypotension/ACE Assessment/Plan: 1. Renal- baseline renal function normal. AKI secondary to likely ATN due to hypotension/ACE has been dialysis requiring from 4/1 to 4/8. Urine output seemed to be picking up. No indications for dialysis today. We'll continue to follow urine output and labs closely- keep access in for now- if numbers stable tomorrow I would feel comfortable with taking out access and discharge with OP follow up 2. Anemia- anemia with iron deficiency. Status post Feraheme times 2- Hemoglobin low but stable- will give one dose ESA as well 4. ID- status post fasciitis- off antibiotics 5. HTN/volume- hemodynamics in good at this time. She is on metoprolol as well as Lasix- will stop Lasix as is not volume overloaded 6. Hyperphosphatemia- previously  on Fosrenol-  Now stopped 7. Hypernatremia- told her she needs to drink lasix held as well  8. Hypokalemia- got 20 PO- will liberalize diet   Leta Bucklin A    Labs: Basic Metabolic Panel:  Recent Labs Lab 09/18/15 0525 09/19/15 0446 09/20/15 0455  NA 148* 146* 149*  K 3.7 3.9 3.3*  CL 107 106 108  CO2 30 29  31   GLUCOSE 115* 148* 144*  BUN 11 15 17   CREATININE 2.62* 3.10* 3.14*  CALCIUM 8.4* 8.5* 8.4*  PHOS 3.2 3.0 3.4   Liver Function Tests:  Recent Labs Lab 09/18/15 0525 09/19/15 0446 09/20/15 0455  ALBUMIN 1.7* 1.8* 1.7*   No results for input(s): LIPASE, AMYLASE in the last 168 hours. No results for input(s): AMMONIA in the last 168 hours. CBC:  Recent Labs Lab 09/16/15 0742 09/20/15 0455  WBC 6.1 8.6  HGB 7.4* 7.6*  HCT 24.1* 25.3*  MCV 98.0 98.8  PLT 216 331   Cardiac Enzymes: No results for input(s): CKTOTAL, CKMB, CKMBINDEX, TROPONINI in the last 168 hours. CBG:  Recent Labs Lab 09/19/15 0735 09/19/15 1159 09/19/15 1644 09/19/15 2205 09/20/15 0729  GLUCAP 120* 92 191* 150* 116*    Iron Studies: No results for input(s): IRON, TIBC, TRANSFERRIN, FERRITIN in the last 72 hours. Studies/Results: No results found. Medications: Infusions:    Scheduled Medications: . aspirin EC  81 mg Oral Daily  . feeding supplement (NEPRO CARB STEADY)  237 mL Oral BID BM  . FLUoxetine  20 mg Oral BID  . heparin  5,000 Units Subcutaneous 3 times per day  . insulin aspart  0-15 Units Subcutaneous TID WC  . insulin glargine  8  Units Subcutaneous QHS  . metoprolol tartrate  25 mg Oral BID  . saccharomyces boulardii  250 mg Oral BID    have reviewed scheduled and prn medications.  Physical Exam: General: Alert, wants to go home Heart: Slightly tachycardic Lungs: Clear Abdomen: Obese, soft, nontender Extremities: No edema Dialysis Access: Right sided IJ Vas-Cath placed 4/1   09/20/2015,10:17 AM  LOS: 12 days

## 2015-09-21 DIAGNOSIS — Z794 Long term (current) use of insulin: Secondary | ICD-10-CM

## 2015-09-21 DIAGNOSIS — E875 Hyperkalemia: Secondary | ICD-10-CM

## 2015-09-21 DIAGNOSIS — E119 Type 2 diabetes mellitus without complications: Secondary | ICD-10-CM

## 2015-09-21 DIAGNOSIS — M726 Necrotizing fasciitis: Secondary | ICD-10-CM

## 2015-09-21 LAB — RENAL FUNCTION PANEL
Albumin: 1.8 g/dL — ABNORMAL LOW (ref 3.5–5.0)
Anion gap: 9 (ref 5–15)
BUN: 18 mg/dL (ref 6–20)
CALCIUM: 8.3 mg/dL — AB (ref 8.9–10.3)
CHLORIDE: 107 mmol/L (ref 101–111)
CO2: 30 mmol/L (ref 22–32)
CREATININE: 2.93 mg/dL — AB (ref 0.44–1.00)
GFR, EST AFRICAN AMERICAN: 19 mL/min — AB (ref >60)
GFR, EST NON AFRICAN AMERICAN: 16 mL/min — AB (ref >60)
Glucose, Bld: 124 mg/dL — ABNORMAL HIGH (ref 65–99)
Phosphorus: 3.2 mg/dL (ref 2.5–4.6)
Potassium: 3.6 mmol/L (ref 3.5–5.1)
SODIUM: 146 mmol/L — AB (ref 135–145)

## 2015-09-21 LAB — GLUCOSE, CAPILLARY
GLUCOSE-CAPILLARY: 115 mg/dL — AB (ref 65–99)
GLUCOSE-CAPILLARY: 104 mg/dL — AB (ref 65–99)

## 2015-09-21 MED ORDER — CLOBETASOL PROPIONATE 0.05 % EX CREA: TOPICAL_CREAM | Freq: Two times a day (BID) | CUTANEOUS | Status: DC

## 2015-09-21 MED ORDER — DARBEPOETIN ALFA 100 MCG/0.5ML IJ SOSY
100.0000 ug | PREFILLED_SYRINGE | Freq: Once | INTRAMUSCULAR | Status: AC
  Administered 2015-09-21: 100 ug via SUBCUTANEOUS
  Filled 2015-09-21: qty 0.5

## 2015-09-21 MED ORDER — METOPROLOL TARTRATE 25 MG PO TABS: 25.0000 mg | ORAL_TABLET | Freq: Two times a day (BID) | ORAL | Status: AC

## 2015-09-21 MED ORDER — CLOBETASOL PROPIONATE 0.05 % EX CREA
TOPICAL_CREAM | Freq: Two times a day (BID) | CUTANEOUS | Status: DC
  Administered 2015-09-21: 11:00:00 via TOPICAL
  Filled 2015-09-21: qty 15

## 2015-09-21 MED ORDER — DIPHENHYDRAMINE HCL 25 MG PO CAPS: 25.0000 mg | ORAL_CAPSULE | Freq: Four times a day (QID) | ORAL | Status: DC | PRN

## 2015-09-21 NOTE — Progress Notes (Signed)
Subjective:  she says she is making a normal amount of urine.  Creatinine down  Objective Vital signs in last 24 hours: Filed Vitals:   09/20/15 1747 09/20/15 2038 09/21/15 0543 09/21/15 0900  BP: 91/51 134/70 93/49 96/49   Pulse: 84 89 78 83  Temp: 99.2 F (37.3 C) 98.9 F (37.2 C) 97.7 F (36.5 C) 98.7 F (37.1 C)  TempSrc: Oral Oral Oral Oral  Resp: 18 18 18 18   Height:      Weight:      SpO2: 98% 100% 100% 98%   Weight change:   Intake/Output Summary (Last 24 hours) at 09/21/15 1005 Last data filed at 09/21/15 0900  Gross per 24 hour  Intake    510 ml  Output      0 ml  Net    510 ml    Assessment/ Plan: Pt is a 60 y.o. yo female who was admitted on 09/08/2015 with AKI - thought ATN secondary to hypotension/ACE Assessment/Plan: 1. Renal- baseline renal function normal. AKI secondary to likely ATN due to hypotension/ACE has been dialysis requiring from 4/1 to 4/8. Urine output seemed to be picking up. No indications for dialysis today. Now dialysis free for 5 days- creatinine trending down.  I am OK with d/c HD access and she can be discharged.  I am fairly confident that her renal function will continue to improve- would arrange follow up labs by her PCP 2. Anemia- anemia with iron deficiency. Status post Feraheme times 2- Hemoglobin low but stable- gave one dose ESA as well 4. ID- status post fasciitis- off antibiotics 5. HTN/volume- hemodynamics in good at this time. She is on metoprolol as well as Lasix- will stop Lasix as is not volume overloaded 6. Hyperphosphatemia- previously  on Fosrenol-  Now stopped 7. Hypernatremia- told her she needs to drink-  lasix held as well  8. Hypokalemia- got 20 PO- will liberalize diet - is better  [name] A    Labs: Basic Metabolic Panel:  Recent Labs Lab 09/19/15 0446 09/20/15 0455 09/21/15 0504  NA 146* 149* 146*  K 3.9 3.3* 3.6  CL 106 108 107  CO2 29 31 30   GLUCOSE 148* 144* 124*  BUN 15 17 18   CREATININE  3.10* 3.14* 2.93*  CALCIUM 8.5* 8.4* 8.3*  PHOS 3.0 3.4 3.2   Liver Function Tests:  Recent Labs Lab 09/19/15 0446 09/20/15 0455 09/21/15 0504  ALBUMIN 1.8* 1.7* 1.8*   No results for input(s): LIPASE, AMYLASE in the last 168 hours. No results for input(s): AMMONIA in the last 168 hours. CBC:  Recent Labs Lab 09/16/15 0742 09/20/15 0455  WBC 6.1 8.6  HGB 7.4* 7.6*  HCT 24.1* 25.3*  MCV 98.0 98.8  PLT 216 331   Cardiac Enzymes: No results for input(s): CKTOTAL, CKMB, CKMBINDEX, TROPONINI in the last 168 hours. CBG:  Recent Labs Lab 09/20/15 0729 09/20/15 1248 09/20/15 1640 09/20/15 2035 09/21/15 0735  GLUCAP 116* 133* 78 142* 115*    Iron Studies: No results for input(s): IRON, TIBC, TRANSFERRIN, FERRITIN in the last 72 hours. Studies/Results: No results found. Medications: Infusions:    Scheduled Medications: . aspirin EC  81 mg Oral Daily  . clobetasol cream   Topical BID  . darbepoetin (ARANESP) injection - NON-DIALYSIS  100 mcg Subcutaneous Q Wed-1800  . feeding supplement (NEPRO CARB STEADY)  237 mL Oral BID BM  . FLUoxetine  20 mg Oral BID  . heparin  5,000 Units Subcutaneous 3 times per day  .  insulin aspart  0-15 Units Subcutaneous TID WC  . insulin glargine  8 Units Subcutaneous QHS  . metoprolol tartrate  25 mg Oral BID  . saccharomyces boulardii  250 mg Oral BID    have reviewed scheduled and prn medications.  Physical Exam: General: Alert, wants to go home Heart: Slightly tachycardic Lungs: Clear Abdomen: Obese, soft, nontender Extremities: No edema Dialysis Access: Right sided IJ Vas-Cath placed 4/1- being removed   09/21/2015,10:05 AM  LOS: 13 days

## 2015-09-21 NOTE — Discharge Instructions (Signed)
Follow with Primary MD TAPPER,[name], MD in 3 days , keep your R.Thigh clean and dry at all timnes  Get CBC, CMP, 2 view Chest X ray checked  by Primary MD next visit.    Activity: As tolerated with Full fall precautions use walker/cane & assistance as needed   Disposition Home     Diet:   Heart Healthy  Low Carb  Accuchecks 4 times/day, Once in AM empty stomach and then before each meal. Log in all results and show them to your Prim.MD in 3 days. If any glucose reading is under 80 or above 300 call your Prim MD immidiately. Follow Low glucose instructions for glucose under 80 as instructed.   For Heart failure patients - Check your Weight same time everyday, if you gain over 2 pounds, or you develop in leg swelling, experience more shortness of breath or chest pain, call your Primary MD immediately. Follow Cardiac Low Salt Diet and 1.5 lit/day fluid restriction.   On your next visit with your primary care physician please Get Medicines reviewed and adjusted.   Please request your Prim.MD to go over all Hospital Tests and Procedure/Radiological results at the follow up, please get all Hospital records sent to your Prim MD by signing hospital release before you go home.   If you experience worsening of your admission symptoms, develop shortness of breath, life threatening emergency, suicidal or homicidal thoughts you must seek medical attention immediately by calling 911 or calling your MD immediately  if symptoms less severe.  You Must read complete instructions/literature along with all the possible adverse reactions/side effects for all the Medicines you take and that have been prescribed to you. Take any new Medicines after you have completely understood and accpet all the possible adverse reactions/side effects.   Do not drive, operating heavy machinery, perform activities at heights, swimming or participation in water activities or provide baby sitting services if your were  admitted for syncope or siezures until you have seen by Primary MD or a Neurologist and advised to do so again.  Do not drive when taking Pain medications.    Do not take more than prescribed Pain, Sleep and Anxiety Medications  Special Instructions: If you have smoked or chewed Tobacco  in the last 2 yrs please stop smoking, stop any regular Alcohol  and or any Recreational drug use.  Wear Seat belts while driving.   Please note  You were cared for by a hospitalist during your hospital stay. If you have any questions about your discharge medications or the care you received while you were in the hospital after you are discharged, you can call the unit and asked to speak with the hospitalist on call if the hospitalist that took care of you is not available. Once you are discharged, your primary care physician will handle any further medical issues. Please note that NO REFILLS for any discharge medications will be authorized once you are discharged, as it is imperative that you return to your primary care physician (or establish a relationship with a primary care physician if you do not have one) for your aftercare needs so that they can reassess your need for medications and monitor your lab values.

## 2015-09-21 NOTE — Progress Notes (Signed)
[name] to be D/C'd Home per MD order.  Discussed prescriptions and follow up appointments with the patient. Prescriptions sent to [facility] in [place], medication list explained in detail. Pt verbalized understanding.  Patient states she gave a bag of medication to the EMS driver that transported [facility] to this facility [facility]. There are no home medications that belong to the patient on this unit.     Medication List    STOP taking these medications        FARXIGA 10 MG Tabs tablet  Generic drug:  dapagliflozin propanediol      TAKE these medications        aspirin EC 81 MG tablet  Take 81 mg by mouth daily.     clobetasol cream 0.05 %  Commonly known as:  TEMOVATE  Apply topically 2 (two) times daily.     diphenhydrAMINE 25 mg capsule  Commonly known as:  BENADRYL  Take 1 capsule (25 mg total) by mouth every 6 (six) hours as needed for itching.     FLUoxetine 20 MG capsule  Commonly known as:  PROZAC  Take 40 mg by mouth daily.     NOVOLOG FLEXPEN 100 UNIT/ML FlexPen  Generic drug:  insulin aspart  Inject 3 Units into the skin 3 (three) times daily with meals.     insulin aspart 100 UNIT/ML injection  Commonly known as:  novoLOG  Before each meal 3 times a day, 140-199 - 2 units, 200-250 - 4 units, 251-299 - 6 units,  300-349 - 8 units,  350 or above 10 units.     insulin glargine 100 UNIT/ML injection  Commonly known as:  LANTUS  Inject 0.18 mLs (18 Units total) into the skin 2 (two) times daily.     metoprolol tartrate 25 MG tablet  Commonly known as:  LOPRESSOR  Take 1 tablet (25 mg total) by mouth 2 (two) times daily.     oxyCODONE 5 MG immediate release tablet  Commonly known as:  Oxy IR/ROXICODONE  Take 1-2 tablets (5-10 mg total) by mouth every 4 (four) hours as needed for moderate pain.     pravastatin 40 MG tablet  Commonly known as:  PRAVACHOL  Take 40 mg by mouth at bedtime.     PROAIR HFA 108 (90 Base) MCG/ACT  inhaler  Generic drug:  albuterol  Inhale 2 puffs into the lungs every 6 (six) hours as needed for wheezing or shortness of breath.        Filed Vitals:   09/21/15 0543 09/21/15 0900  BP: 93/49 96/49  Pulse: 78 83  Temp: 97.7 F (36.5 C) 98.7 F (37.1 C)  Resp: 18 18    Skin clean, dry and intact without evidence of skin break down, no evidence of skin tears noted. IJ removed by IV Team RN. Site without signs and symptoms of complications. Dressing and pressure applied. Pt denies pain at this time. No complaints noted.  An After Visit Summary was printed and given to the patient. Patient escorted via WC, and D/C home via private auto.  Modena NunneryHubbard, Davis Ambrosini C 09/21/2015 12:47 PM

## 2015-09-21 NOTE — Discharge Summary (Signed)
[name], is a 60 y.o. female  DOB 12-25-1955  MRN [id number].  Admission date:  09/08/2015  Admitting Physician  Briscoe Deutscher, MD  Discharge Date:  09/21/2015   Primary MD  [name], MD  Recommendations for primary care physician for things to follow:   Check CBC, BMP, phosphorus in 3-4 days and again in 3 weeks. If needed outpatient renal follow-up  Right thigh wound monitoring outpatient   Admission Diagnosis  ABNORMAL LABS   Discharge Diagnosis  ABNORMAL LABS    Principal Problem:   Acute renal failure (ARF) (HCC) Active Problems:   Necrotizing fasciitis (HCC) of inner thigh   IDDM (insulin dependent diabetes mellitus) (HCC)   History of hypertension   Hyperkalemia   Generalized exfoliative dermatitis   Normocytic anemia   Hyperphosphatemia   Hypermagnesemia   UTI (urinary tract infection)      Past Medical History  Diagnosis Date  . Diabetes mellitus without complication (HCC)   . Hypertension     Past Surgical History  Procedure Laterality Date  . Incision and drainage abscess Right 08/08/2015    Procedure: Irrigation and Debridement of Right Lower Ext.;  Surgeon: Axel Filler, MD;  Location: [facility] OR;  Service: General;  Laterality: Right;  . Incision and drainage Right 08/09/2015    Procedure: INCISION AND DRAINAGE;  Surgeon: De Blanch Kinsinger, MD;  Location: [facility] OR;  Service: General;  Laterality: Right;       HPI  from the history and physical done on the day of admission:    [name] is an 60 y.o. female with a PMH of hypertension and insulin-dependent diabetes was transferred from [facility] for evaluation of acute renal failure and associated hyperkalemia. Patient was recently hospitalized 08/07/15-08/15/15 for treatment of sepsis secondary to  necrotizing fasciitis. She was discharged home on clindamycin. A follow-up check of her creatinine on 09/05/15 revealed marked elevation of 15.7 (1.16 at discharge).  She is still oliguric, has received dialysis multiple times this admission. Renal monitoring. Once renal function stabilizes she will be discharged.     Hospital Course:   Acute renal failure (ARF) (HCC) Admission creatinine 18.71. Creatinine last noted to be 1.16 at her prior hospital discharge. Nephrology consultation performed 09/09/15 and the patient was subsequently taken for hemodialysis. Urinalysis negative for casts. Renal ultrasound negative for hydronephrosis. Etiology presumed to be ATN. Prognosis for recovery of renal function Was felt to be good. Her renal function improved after a few runs of hemodialysis, now renal function and creatinine improving without dialysis for the last 3 days.   Discussed with Dr. Lacy Duverney on 09/21/2015, per nephrology remove dialysis catheter and discharge home with outpatient PCP follow-up, if renal function declines further then outpatient renal follow-up. Check CBC, BMP and phosphorus levels in 3-4 days and again in 3 weeks.   Enterobacter and group B strep UTI She completed her treatment with Rocephin.  Necrotizing fasciitis (HCC) of R. inner thigh Patient has a large open wound on her medial right thigh  after debridement last month. She has been receiving wound care through home health services. She completed a course of clindamycin. One set of blood cultures was drawn on admission. Wound care nurse evaluated patient 09/09/15 and placed dressing change orders. Wound seen by surgery, recommends ongoing W--->D dressing changes, F/U with Dr. Sheliah Hatch which was arranged. [facility] PT and RN ordered.  IDDM (insulin dependent diabetes mellitus) (HCC) Home medications Farxiga discontinued in the light of renal issues. Again your home dose insulin with close A1c and CBG monitoring by  PCP.  Lab Results  Component Value Date   HGBA1C 9.7* 09/09/2015   CBG (last 3)   Recent Labs  09/20/15 1640 09/20/15 2035 09/21/15 0735  GLUCAP 78 142* 115*    History of hypertension Stable on beta blocker.  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 Monitor phosphorus levels outpatient in a week.  Generalized exfoliative dermatitis This appears to be new since her recent hospital discharge, and was concerning for a possible adverse reaction to outpatient antibiotic or medication she received prior to admission. With supportive care with skin moisturizers and topical steroid cream she is much better.  Normocytic anemia- AOCD Consistent with usual baseline values. Likely anemia of chronic disease. Ferriheme given per Renal.Monitor H&H outpatient post discharge.  SVT morning of 09/18/2015. She was asymptomatic, TSH is stable, stable echogram With EF of 60% and mild chronic diastolic dysfunction, placed on low-dose beta blocker. She is symptom free, and remaines stable on telemetry.   Follow UP  Follow-up Information    Follow up with Rodman Pickle, MD On 10/05/2015.   Specialty:  General Surgery   Why:  arrive by 10AM for a 10:30AM wound check with your surgeon   Contact information:   [address] [place] [address] [phone]       Follow up with TAPPER,[name], MD. Schedule an appointment as soon as possible for a visit in 3 days.   Specialty:  Family Medicine   Contact information:   [address] Raeanne Gathers [place] [address] [phone]        Consults obtained - Renal  Discharge Condition: Stable  Diet and Activity recommendation: See Discharge Instructions below  Discharge Instructions       Discharge Instructions    Discharge instructions    Complete by:  As directed   Follow with Primary MD TAPPER,[name], MD in 3 days , keep your R.Thigh clean and  dry at all timnes  Get CBC, CMP, 2 view Chest X ray checked  by Primary MD next visit.    Activity: As tolerated with Full fall precautions use walker/cane & assistance as needed   Disposition Home     Diet:   Heart Healthy  Low Carb  Accuchecks 4 times/day, Once in AM empty stomach and then before each meal. Log in all results and show them to your Prim.MD in 3 days. If any glucose reading is under 80 or above 300 call your Prim MD immidiately. Follow Low glucose instructions for glucose under 80 as instructed.   For Heart failure patients - Check your Weight same time everyday, if you gain over 2 pounds, or you develop in leg swelling, experience more shortness of breath or chest pain, call your Primary MD immediately. Follow Cardiac Low Salt Diet and 1.5 lit/day fluid restriction.   On your next visit with your primary care physician please Get Medicines reviewed and adjusted.  Please request your Prim.MD to go over all Hospital Tests and Procedure/Radiological results at the follow up, please get all Hospital records sent to your Prim MD by signing hospital release before you go home.   If you experience worsening of your admission symptoms, develop shortness of breath, life threatening emergency, suicidal or homicidal thoughts you must seek medical attention immediately by calling 911 or calling your MD immediately  if symptoms less severe.  You Must read complete instructions/literature along with all the possible adverse reactions/side effects for all the Medicines you take and that have been prescribed to you. Take any new Medicines after you have completely understood and accpet all the possible adverse reactions/side effects.   Do not drive, operating heavy machinery, perform activities at heights, swimming or participation in water activities or provide baby sitting services if your were admitted for syncope or siezures until you have seen by Primary MD or a Neurologist  and advised to do so again.  Do not drive when taking Pain medications.    Do not take more than prescribed Pain, Sleep and Anxiety Medications  Special Instructions: If you have smoked or chewed Tobacco  in the last 2 yrs please stop smoking, stop any regular Alcohol  and or any Recreational drug use.  Wear Seat belts while driving.   Please note  You were cared for by a hospitalist during your hospital stay. If you have any questions about your discharge medications or the care you received while you were in the hospital after you are discharged, you can call the unit and asked to speak with the hospitalist on call if the hospitalist that took care of you is not available. Once you are discharged, your primary care physician will handle any further medical issues. Please note that NO REFILLS for any discharge medications will be authorized once you are discharged, as it is imperative that you return to your primary care physician (or establish a relationship with a primary care physician if you do not have one) for your aftercare needs so that they can reassess your need for medications and monitor your lab values.     Increase activity slowly    Complete by:  As directed              Discharge Medications       Medication List    STOP taking these medications        FARXIGA 10 MG Tabs tablet  Generic drug:  dapagliflozin propanediol      TAKE these medications        aspirin EC 81 MG tablet  Take 81 mg by mouth daily.     clobetasol cream 0.05 %  Commonly known as:  TEMOVATE  Apply topically 2 (two) times daily.     diphenhydrAMINE 25 mg capsule  Commonly known as:  BENADRYL  Take 1 capsule (25 mg total) by mouth every 6 (six) hours as needed for itching.     FLUoxetine 20 MG capsule  Commonly known as:  PROZAC  Take 40 mg by mouth daily.     NOVOLOG FLEXPEN 100 UNIT/ML FlexPen  Generic drug:  insulin aspart  Inject 3 Units into the skin 3 (three) times daily  with meals.     insulin aspart 100 UNIT/ML injection  Commonly known as:  novoLOG  Before each meal 3 times a day, 140-199 - 2 units, 200-250 - 4 units, 251-299 - 6 units,  300-349 - 8 units,  350 or  above 10 units.     insulin glargine 100 UNIT/ML injection  Commonly known as:  LANTUS  Inject 0.18 mLs (18 Units total) into the skin 2 (two) times daily.     metoprolol tartrate 25 MG tablet  Commonly known as:  LOPRESSOR  Take 1 tablet (25 mg total) by mouth 2 (two) times daily.     oxyCODONE 5 MG immediate release tablet  Commonly known as:  Oxy IR/ROXICODONE  Take 1-2 tablets (5-10 mg total) by mouth every 4 (four) hours as needed for moderate pain.     pravastatin 40 MG tablet  Commonly known as:  PRAVACHOL  Take 40 mg by mouth at bedtime.     PROAIR HFA 108 (90 Base) MCG/ACT inhaler  Generic drug:  albuterol  Inhale 2 puffs into the lungs every 6 (six) hours as needed for wheezing or shortness of breath.        Major procedures and Radiology Reports - PLEASE review detailed and final reports for all details, in brief -    TTE  Incomplete study. LVEF 60-65%, mild LVH, normal wall motion, diastolic dysfunction, indeterminate LV filling pressure, trivail MR, normal LA size, mild TR, RVSP 31 mmHg + RAP.   [place] Renal  09/09/2015  CLINICAL DATA:  Acute onset of renal insufficiency. Initial encounter. EXAM: RENAL / URINARY TRACT ULTRASOUND COMPLETE COMPARISON:  CT of the pelvis performed 08/07/2015 FINDINGS: Right Kidney: Length: 11.3 cm. Echogenicity within normal limits. No mass or hydronephrosis visualized. Left Kidney: Length: 11.3 cm. Echogenicity within normal limits. No mass or hydronephrosis visualized. Bladder: Appears normal for degree of bladder distention. IMPRESSION: Unremarkable renal ultrasound. Electronically Signed   By: Roanna RaiderJeffery  Chang M.D.   On: 09/09/2015 05:38   Dg Chest Port 1 View  09/09/2015  CLINICAL DATA:  Hypertension, diabetes, acute renal failure EXAM:  PORTABLE CHEST 1 VIEW COMPARISON:  08/10/2015 FINDINGS: Right IJ temporary dialysis catheter tip at the level of the mid SVC. Exam is slightly rotated to the left. Low lung volumes evident. Heart is enlarged with central vascular congestion. Minor basilar atelectasis. No effusion or pneumothorax. IMPRESSION: Right IJ temporary dialysis catheter tip mid SVC level. Cardiomegaly with vascular congestion Low lung volumes and basilar atelectasis Electronically Signed   By: Judie PetitM.  Shick M.D.   On: 09/09/2015 14:46    Micro Results      No results found for this or any previous visit (from the past 240 hour(s)).     Today   Subjective    Mady GemmaGenevieve Dokes today has no headache,no chest abdominal pain,no new weakness tingling or numbness, feels much better wants to go home today.     Objective   Blood pressure 93/49, pulse 78, temperature 97.7 F (36.5 C), temperature source Oral, resp. rate 18, height 5\' 3"  (1.6 m), weight 90.719 kg (200 lb), SpO2 100 %.   Intake/Output Summary (Last 24 hours) at 09/21/15 0759 Last data filed at 09/20/15 1700  Gross per 24 hour  Intake    600 ml  Output      0 ml  Net    600 ml    Exam Awake Alert, Oriented x 3, No new F.N deficits, Normal affect Jay.AT,PERRAL Supple Neck,No JVD, No cervical lymphadenopathy appriciated.  Symmetrical Chest wall movement, Good air movement bilaterally, CTAB RRR,No Gallops,Rubs or new Murmurs, No Parasternal Heave +ve B.Sounds, Abd Soft, Non tender, No organomegaly appriciated, No rebound -guarding or rigidity. No Cyanosis, Clubbing or edema, No new Rash or bruise, Dry skin  which is diffuse but improving   Data Review   CBC w Diff: Lab Results  Component Value Date   WBC 8.6 09/20/2015   HGB 7.6* 09/20/2015   HCT 25.3* 09/20/2015   PLT 331 09/20/2015   LYMPHOPCT 7 09/12/2015   MONOPCT 4 09/12/2015   EOSPCT 9 09/12/2015   BASOPCT 0 09/12/2015    CMP: Lab Results  Component Value Date   NA 146* 09/21/2015    K 3.6 09/21/2015   CL 107 09/21/2015   CO2 30 09/21/2015   BUN 18 09/21/2015   CREATININE 2.93* 09/21/2015   PROT 7.0 09/09/2015   ALBUMIN 1.8* 09/21/2015   BILITOT 0.7 09/09/2015   ALKPHOS 127* 09/09/2015   AST 12* 09/09/2015   ALT 17 09/09/2015  .   Total Time in preparing paper work, data evaluation and todays exam - 35 minutes  Leroy Sea M.D on 09/21/2015 at 7:59 AM  [facility]   Office  [phone]

## 2015-09-21 NOTE — Progress Notes (Signed)
09/21/2015 12:34 PM  Patient and family member inquired to RN Camieko that the patient had a bag full of home medications and wanted them returned upon discharge. This RN called down to main pharmacy who verified that no medication had been turned in to pharmacy for this patient. Will inform International aid/development workerCamieko RN.   PACCAR Incyanne Hill BSN, RN-BC, [employer] [facility] Phone [phone]

## 2015-09-21 NOTE — Care Management Note (Addendum)
Case Management Note  Patient Details  Name: [name] MRN: [id number] Date of Birth: Mar 02, 1956  Subjective/Objective:        CM following for progression and d/c planning.            Action/Plan: 09/21/2015 Met with pt who has [place] services and believes that the agency is [facility], [place] notified of [place] need for [place], [place] and aide. 3:1 commode ordered and this CM notified [facility] of plan to d/c today.  Per [facility] this pt is not active with that agency. Await friend to arrive to who hopefully will recall the name of pt [place] agency. If they are unable to recall , the pt has been instructed to call this CM after she arrives at home with the name of the agency as she has a notebook and other info from the agency at home.  Will notify agency of [place] needs as soon as pt calls back after d/c. We are unable to move forward with this as none of the [facility] agencies reviewed with the pt sounded familiar and we are unable to setup a second agency if she is currently active with an agency.  Have learned that this pt is active with [facility] ,contacted that agency and faxed orders.   Expected Discharge Date:  09/12/15               Expected Discharge Plan:  [place]  In-House Referral:  NA  Discharge planning Services  CM Consult  Post Acute Care Choice:  Durable Medical Equipment, Home Health Choice offered to:  Patient  DME Arranged:  3-N-1 DME Agency:  [place]:  RN, PT, Nurse's Aide [place] Agency:  [facility]  Status of Service:  Completed, signed off  Medicare Important Message Given:    Date Medicare IM Given:    Medicare IM give by:    Date Additional Medicare IM Given:    Additional Medicare Important Message give by:     If discussed at [place] of Stay Meetings, dates discussed:    Additional Comments:  Adron Bene, RN 09/21/2015, 10:39 AM

## 2015-09-27 ENCOUNTER — Encounter (HOSPITAL_COMMUNITY): Payer: Self-pay

## 2015-09-27 ENCOUNTER — Emergency Department (HOSPITAL_COMMUNITY)
Admission: EM | Admit: 2015-09-27 | Discharge: 2015-09-28 | Disposition: A | Discharge status: Home / Self Care | Payer: BLUE CROSS/BLUE SHIELD | Attending: Emergency Medicine | Admitting: Emergency Medicine

## 2015-09-27 DIAGNOSIS — Z794 Long term (current) use of insulin: Secondary | ICD-10-CM | POA: Insufficient documentation

## 2015-09-27 DIAGNOSIS — F17210 Nicotine dependence, cigarettes, uncomplicated: Secondary | ICD-10-CM | POA: Diagnosis not present

## 2015-09-27 DIAGNOSIS — Z79899 Other long term (current) drug therapy: Secondary | ICD-10-CM | POA: Insufficient documentation

## 2015-09-27 DIAGNOSIS — Z7982 Long term (current) use of aspirin: Secondary | ICD-10-CM | POA: Diagnosis not present

## 2015-09-27 DIAGNOSIS — E11649 Type 2 diabetes mellitus with hypoglycemia without coma: Secondary | ICD-10-CM | POA: Diagnosis present

## 2015-09-27 DIAGNOSIS — N289 Disorder of kidney and ureter, unspecified: Secondary | ICD-10-CM

## 2015-09-27 DIAGNOSIS — I10 Essential (primary) hypertension: Secondary | ICD-10-CM | POA: Insufficient documentation

## 2015-09-27 DIAGNOSIS — E162 Hypoglycemia, unspecified: Secondary | ICD-10-CM

## 2015-09-27 LAB — BASIC METABOLIC PANEL
ANION GAP: 11 (ref 5–15)
BUN: 33 mg/dL — ABNORMAL HIGH (ref 6–20)
CALCIUM: 8 mg/dL — AB (ref 8.9–10.3)
CO2: 25 mmol/L (ref 22–32)
Chloride: 98 mmol/L — ABNORMAL LOW (ref 101–111)
Creatinine, Ser: 3.43 mg/dL — ABNORMAL HIGH (ref 0.44–1.00)
GFR, EST AFRICAN AMERICAN: 16 mL/min — AB (ref >60)
GFR, EST NON AFRICAN AMERICAN: 14 mL/min — AB (ref >60)
GLUCOSE: 160 mg/dL — AB (ref 65–99)
POTASSIUM: 3.5 mmol/L (ref 3.5–5.1)
Sodium: 134 mmol/L — ABNORMAL LOW (ref 135–145)

## 2015-09-27 LAB — URINE MICROSCOPIC-ADD ON

## 2015-09-27 LAB — CBC WITH DIFFERENTIAL/PLATELET
BASOS ABS: 0 10*3/uL (ref 0.0–0.1)
BASOS PCT: 0 %
Eosinophils Absolute: 0.1 10*3/uL (ref 0.0–0.7)
Eosinophils Relative: 1 %
HEMATOCRIT: 27.7 % — AB (ref 36.0–46.0)
HEMOGLOBIN: 9.6 g/dL — AB (ref 12.0–15.0)
LYMPHS PCT: 13 %
Lymphs Abs: 1.1 10*3/uL (ref 0.7–4.0)
MCH: 34 pg (ref 26.0–34.0)
MCHC: 34.7 g/dL (ref 30.0–36.0)
MCV: 98.2 fL (ref 78.0–100.0)
Monocytes Absolute: 0.8 10*3/uL (ref 0.1–1.0)
Monocytes Relative: 9 %
NEUTROS ABS: 6.8 10*3/uL (ref 1.7–7.7)
NEUTROS PCT: 77 %
Platelets: 293 10*3/uL (ref 150–400)
RBC: 2.82 MIL/uL — AB (ref 3.87–5.11)
RDW: 16 % — ABNORMAL HIGH (ref 11.5–15.5)
WBC: 8.8 10*3/uL (ref 4.0–10.5)

## 2015-09-27 LAB — URINALYSIS, ROUTINE W REFLEX MICROSCOPIC
Bilirubin Urine: NEGATIVE
GLUCOSE, UA: NEGATIVE mg/dL
KETONES UR: NEGATIVE mg/dL
LEUKOCYTES UA: NEGATIVE
NITRITE: NEGATIVE
PH: 5.5 (ref 5.0–8.0)
Protein, ur: 30 mg/dL — AB

## 2015-09-27 LAB — CBG MONITORING, ED: Glucose-Capillary: 77 mg/dL (ref 65–99)

## 2015-09-27 MED ORDER — SODIUM CHLORIDE 0.9 % IV BOLUS (SEPSIS)
1000.0000 mL | Freq: Once | INTRAVENOUS | Status: AC
  Administered 2015-09-27: 1000 mL via INTRAVENOUS

## 2015-09-27 NOTE — ED Notes (Signed)
Pt fed a soft diet, ate 50%, very low appetite, states "I just don't want it, I'm cold." Bear hugger applied due to rectal temp 92.5 - Dr Effie ShyWentz made aware.

## 2015-09-27 NOTE — ED Notes (Signed)
Pt had one episode of diarrhea in bedpan,

## 2015-09-27 NOTE — ED Notes (Signed)
Dr Effie ShyWentz aware of pt's bp, additional orders given,

## 2015-09-27 NOTE — ED Notes (Signed)
Dr Wentz at bedside,  

## 2015-09-27 NOTE — ED Notes (Signed)
Dressing to wound on right thigh changed due to have stool on dressing, wound is pink, no redness or drainage noted, wet to dry dressing placed per pt's instructions,

## 2015-09-27 NOTE — ED Notes (Signed)
Contacted pt's family member Kathlene NovemberMike [phone] who advised that he would come pick pt up,

## 2015-09-27 NOTE — Discharge Instructions (Signed)
Continue to drink plenty of water. Try to eat 3 meals a day, and use a nutritional supplements such as Glucerna if needed.   Hypoglycemia Hypoglycemia occurs when the glucose in your blood is too low. Glucose is a type of sugar that is your body's main energy source. Hormones, such as insulin and glucagon, control the level of glucose in the blood. Insulin lowers blood glucose and glucagon increases blood glucose. Having too much insulin in your blood stream, or not eating enough food containing sugar, can result in hypoglycemia. Hypoglycemia can happen to people with or without diabetes. It can develop quickly and can be a medical emergency.  CAUSES   Missing or delaying meals.  Not eating enough carbohydrates at meals.  Taking too much diabetes medicine.  Not timing your oral diabetes medicine or insulin doses with meals, snacks, and exercise.  Nausea and vomiting.  Certain medicines.  Severe illnesses, such as hepatitis, kidney disorders, and certain eating disorders.  Increased activity or exercise without eating something extra or adjusting medicines.  Drinking too much alcohol.  A nerve disorder that affects body functions like your heart rate, blood pressure, and digestion (autonomic neuropathy).  A condition where the stomach muscles do not function properly (gastroparesis). Therefore, medicines and food may not absorb properly.  Rarely, a tumor of the pancreas can produce too much insulin. SYMPTOMS   Hunger.  Sweating (diaphoresis).  Change in body temperature.  Shakiness.  Headache.  Anxiety.  Lightheadedness.  Irritability.  Difficulty concentrating.  Dry mouth.  Tingling or numbness in the hands or feet.  Restless sleep or sleep disturbances.  Altered speech and coordination.  Change in mental status.  Seizures or prolonged convulsions.  Combativeness.  Drowsiness (lethargic).  Weakness.  Increased heart rate or  palpitations.  Confusion.  Pale, gray skin color.  Blurred or double vision.  Fainting. DIAGNOSIS  A physical exam and medical history will be performed. Your caregiver may make a diagnosis based on your symptoms. Blood tests and other lab tests may be performed to confirm a diagnosis. Once the diagnosis is made, your caregiver will see if your signs and symptoms go away once your blood glucose is raised.  TREATMENT  Usually, you can easily treat your hypoglycemia when you notice symptoms.  Check your blood glucose. If it is less than 70 mg/dl, take one of the following:   3-4 glucose tablets.    cup juice.    cup regular soda.   1 cup skim milk.   -1 tube of glucose gel.   5-6 hard candies.   Avoid high-fat drinks or food that may delay a rise in blood glucose levels.  Do not take more than the recommended amount of sugary foods, drinks, gel, or tablets. Doing so will cause your blood glucose to go too high.   Wait 10-15 minutes and recheck your blood glucose. If it is still less than 70 mg/dl or below your target range, repeat treatment.   Eat a snack if it is more than 1 hour until your next meal.  There may be a time when your blood glucose may go so low that you are unable to treat yourself at home when you start to notice symptoms. You may need someone to help you. You may even faint or be unable to swallow. If you cannot treat yourself, someone will need to bring you to the hospital.  HOME CARE INSTRUCTIONS  If you have diabetes, follow your diabetes management plan by:  Taking  your medicines as directed.  Following your exercise plan.  Following your meal plan. Do not skip meals. Eat on time.  Testing your blood glucose regularly. Check your blood glucose before and after exercise. If you exercise longer or different than usual, be sure to check blood glucose more frequently.  Wearing your medical alert jewelry that says you have  diabetes.  Identify the cause of your hypoglycemia. Then, develop ways to prevent the recurrence of hypoglycemia.  Do not take a hot bath or shower right after an insulin shot.  Always carry treatment with you. Glucose tablets are the easiest to carry.  If you are going to drink alcohol, drink it only with meals.  Tell friends or family members ways to keep you safe during a seizure. This may include removing hard or sharp objects from the area or turning you on your side.  Maintain a healthy weight. SEEK MEDICAL CARE IF:   You are having problems keeping your blood glucose in your target range.  You are having frequent episodes of hypoglycemia.  You feel you might be having side effects from your medicines.  You are not sure why your blood glucose is dropping so low.  You notice a change in vision or a new problem with your vision. SEEK IMMEDIATE MEDICAL CARE IF:   Confusion develops.  A change in mental status occurs.  The inability to swallow develops.  Fainting occurs.   This information is not intended to replace advice given to you by your health care provider. Make sure you discuss any questions you have with your health care provider.   Document Released: 05/27/2005 Document Revised: 06/01/2013 Document Reviewed: 01/31/2015 Elsevier Interactive Patient Education [employer]

## 2015-09-27 NOTE — ED Provider Notes (Signed)
CSN: [id number]     Arrival date & time 09/27/15  1512 History   First MD Initiated Contact with Patient 09/27/15 1527     Chief Complaint  Patient presents with  . Hypoglycemia     (Consider location/radiation/quality/duration/timing/severity/associated sxs/prior Treatment) HPI   [name] is a 60 y.o. female Presents for evaluation of a mental status. She was at home with family members, when she was found to be altered. Therefore, they called an ambulance. She has been having difficulty eating recently because "I don't have teeth". Prior to coming here today she remembers being  on the phone trying to get someone to help her get teeth.. She denies recent fever, chills, nausea, vomiting, weakness or dizziness. She is taking her usual medications except has not taken her short-acting insulin, this morning. She did not eat yet today. She takes Lantus at nighttime. There are no other no modifying factors.   Past Medical History  Diagnosis Date  . Diabetes mellitus without complication (HCC)   . Hypertension    Past Surgical History  Procedure Laterality Date  . Incision and drainage abscess Right 08/08/2015    Procedure: Irrigation and Debridement of Right Lower Ext.;  Surgeon: Axel Filler, MD;  Location: [facility] OR;  Service: General;  Laterality: Right;  . Incision and drainage Right 08/09/2015    Procedure: INCISION AND DRAINAGE;  Surgeon: Rodman Pickle, MD;  Location: [facility] OR;  Service: General;  Laterality: Right;   No family history on file. Social History  Substance Use Topics  . Smoking status: Current Some Day Smoker    Types: Cigarettes  . Smokeless tobacco: None  . Alcohol Use: Yes     Comment: occ.   OB History    No data available     Review of Systems  All other systems reviewed and are negative.     Allergies  Other  Home Medications   Prior to Admission medications   Medication Sig Start Date End Date Taking? Authorizing Provider   aspirin EC 81 MG tablet Take 81 mg by mouth daily.   Yes Historical Provider, MD  clobetasol cream (TEMOVATE) 0.05 % Apply topically 2 (two) times daily. 09/21/15  Yes Leroy Sea, MD  diphenhydrAMINE (BENADRYL) 25 mg capsule Take 1 capsule (25 mg total) by mouth every 6 (six) hours as needed for itching. 09/21/15  Yes Leroy Sea, MD  FLUoxetine (PROZAC) 20 MG capsule Take 20 mg by mouth 2 (two) times daily.  05/11/15  Yes Historical Provider, MD  insulin aspart (NOVOLOG FLEXPEN) 100 UNIT/ML FlexPen Inject 3 Units into the skin 3 (three) times daily with meals.   Yes Historical Provider, MD  insulin glargine (LANTUS) 100 UNIT/ML injection Inject 0.18 mLs (18 Units total) into the skin 2 (two) times daily. Patient taking differently: Inject 18 Units into the skin at bedtime.  08/15/15  Yes Leroy Sea, MD  metoprolol tartrate (LOPRESSOR) 25 MG tablet Take 1 tablet (25 mg total) by mouth 2 (two) times daily. 09/21/15  Yes Leroy Sea, MD  pravastatin (PRAVACHOL) 40 MG tablet Take 40 mg by mouth at bedtime.  05/11/15  Yes Historical Provider, MD  albuterol (PROAIR HFA) 108 (90 Base) MCG/ACT inhaler Inhale 2 puffs into the lungs every 6 (six) hours as needed for wheezing or shortness of breath.    Historical Provider, MD  insulin aspart (NOVOLOG) 100 UNIT/ML injection Before each meal 3 times a day, 140-199 - 2 units, 200-250 - 4 units,  251-299 - 6 units,  300-349 - 8 units,  350 or above 10 units. Patient not taking: Reported on 09/09/2015 08/15/15   Leroy SeaPrashant K Singh, MD  oxyCODONE (OXY IR/ROXICODONE) 5 MG immediate release tablet Take 1-2 tablets (5-10 mg total) by mouth every 4 (four) hours as needed for moderate pain. Patient not taking: Reported on 09/09/2015 08/15/15   Leroy SeaPrashant K Singh, MD   BP 94/50 mmHg  Pulse 76  Temp(Src) 92.5 F (33.6 C) (Rectal)  Resp 18  Ht 5\' 3"  (1.6 m)  Wt 218 lb (98.884 kg)  BMI 38.63 kg/m2  SpO2 100% Physical Exam  Constitutional: She is oriented to  person, place, and time. She appears well-developed. No distress.  Elderly appearing, older than stated age  HENT:  Head: Normocephalic and atraumatic.  Right Ear: External ear normal.  Left Ear: External ear normal.  Edentulous, no oral lesions  Eyes: Conjunctivae and EOM are normal. Pupils are equal, round, and reactive to light.  Neck: Normal range of motion and phonation normal. Neck supple.  Cardiovascular: Normal rate, regular rhythm and normal heart sounds.   Borderline hypotension  Pulmonary/Chest: Effort normal and breath sounds normal. She exhibits no bony tenderness.  Abdominal: Soft. There is no tenderness.  Musculoskeletal: Normal range of motion.  Neurological: She is alert and oriented to person, place, and time. No cranial nerve deficit or sensory deficit. She exhibits normal muscle tone. Coordination normal.  Skin: Skin is warm, dry and intact.  Psychiatric: She has a normal mood and affect. Her behavior is normal. Judgment and thought content normal.  Nursing note and vitals reviewed.   ED Course  Procedures (including critical care time)  Initial Clinical Impression- AMS d/t hypoglycemia, likely nutritional. Associated hypoglycemia without clinical signs of SBI. Will treat symptomatically and evaluate with labs, then reassess. Oral nutrition ordered.  Medications  sodium chloride 0.9 % bolus 1,000 mL (0 mLs Intravenous Stopped 09/27/15 2305)    Patient Vitals for the past 24 hrs:  BP Temp Temp src Pulse Resp SpO2  09/27/15 2338 - 98.9 F (37.2 C) Oral - - -  09/27/15 2330 (!) 101/53 mmHg - - 81 - 93 %  09/27/15 2300 105/58 mmHg - - 83 20 97 %  09/27/15 2230 104/56 mmHg - - 84 18 100 %  09/27/15 2200 110/62 mmHg - - 83 - 100 %  09/27/15 2141 115/64 mmHg 98.4 F (36.9 C) Oral 85 20 100 %  09/27/15 2130 115/64 mmHg - - 82 - 96 %  09/27/15 2013 152/72 mmHg - - 81 18 100 %  09/27/15 2000 107/64 mmHg - - 85 - 97 %  09/27/15 1930 (!) 89/51 mmHg - - 81 - 96 %   09/27/15 1911 (!) 92/53 mmHg - - 83 18 100 %  09/27/15 1852 - 97.6 F (36.4 C) Oral - - -  09/27/15 1830 95/65 mmHg - - 82 - 96 %  09/27/15 1800 100/59 mmHg - - 80 - 93 %  09/27/15 1745 - - - 81 - 97 %  09/27/15 1736 - (!) 95.4 F (35.2 C) Rectal - - -  09/27/15 1730 (!) 80/62 mmHg - - - - -  09/27/15 1715 - - - 77 - 100 %  09/27/15 1700 - - - 83 - 95 %  09/27/15 1645 - - - 76 - 96 %  09/27/15 1630 (!) 94/50 mmHg - - 76 - 100 %  09/27/15 1615 - - - (!) 129 - 100 %  At D/CReevaluation with update and discussion. After initial assessment and treatment, an updated evaluation reveals she is alert, calm and cooperative. She has been able to eat and drink. She is alert and lucid. No further c/o. Amberleigh Gerken L   CRITICAL CARE Performed by: Mancel Bale L Total critical care time: 35 minutes Critical care time was exclusive of separately billable procedures and treating other patients. Critical care was necessary to treat or prevent imminent or life-threatening deterioration. Critical care was time spent personally by me on the following activities: development of treatment plan with patient and/or surrogate as well as nursing, discussions with consultants, evaluation of patient's response to treatment, examination of patient, obtaining history from patient or surrogate, ordering and performing treatments and interventions, ordering and review of laboratory studies, ordering and review of radiographic studies, pulse oximetry and re-evaluation of patient's condition.  Labs Review Labs Reviewed  CBC WITH DIFFERENTIAL/PLATELET - Abnormal; Notable for the following:    RBC 2.82 (*)    Hemoglobin 9.6 (*)    HCT 27.7 (*)    RDW 16.0 (*)    All other components within normal limits  BASIC METABOLIC PANEL  URINALYSIS, ROUTINE W REFLEX MICROSCOPIC (NOT AT [facility])  CBG MONITORING, ED    Imaging Review No results found. I have personally reviewed and evaluated these images and lab results  as part of my medical decision-making.   EKG Interpretation None      MDM   Final diagnoses:  None   AMS d/y hypoglycemia, complicated by hypothermia and hypotension. Improved in ED, with treatment. Doubt SBI,metabolic instability, or impending vascular collapse.  Nursing Notes Reviewed/ Care Coordinated Applicable Imaging Reviewed Interpretation of Laboratory Data incorporated into ED treatment  The patient appears reasonably screened and/or stabilized for discharge and I doubt any other medical condition or other [facility] requiring further screening, evaluation, or treatment in the ED at this time prior to discharge.  Plan: Home Medications- usual; Home Treatments- supplement nutrition with nutritional shakes.; return here if the recommended treatment, does not improve the symptoms; Recommended follow up- PCP 1 week   I personally performed the services described in this documentation, which was scribed in my presence. The recorded information has been reviewed and is accurate.    Mancel Bale, MD 09/28/15 [phone]

## 2015-09-27 NOTE — ED Notes (Addendum)
EMS reports patient with lo po intake d/t lost dentures, on insulin, friend stated pt "passed out." CBG 67 on EMS arrival - pt given po intake - up to 76.Pt states she feels much better.

## 2015-10-12 ENCOUNTER — Encounter (HOSPITAL_COMMUNITY): Payer: Self-pay

## 2015-10-12 ENCOUNTER — Inpatient Hospital Stay (HOSPITAL_COMMUNITY)
Admission: EM | Admit: 2015-10-12 | Discharge: 2015-10-19 | DRG: 872 | Disposition: A | Discharge status: Home Health | Payer: BLUE CROSS/BLUE SHIELD | Attending: Family Medicine | Admitting: Family Medicine

## 2015-10-12 DIAGNOSIS — D7589 Other specified diseases of blood and blood-forming organs: Secondary | ICD-10-CM | POA: Diagnosis present

## 2015-10-12 DIAGNOSIS — I129 Hypertensive chronic kidney disease with stage 1 through stage 4 chronic kidney disease, or unspecified chronic kidney disease: Secondary | ICD-10-CM | POA: Diagnosis present

## 2015-10-12 DIAGNOSIS — M79621 Pain in right upper arm: Secondary | ICD-10-CM | POA: Diagnosis present

## 2015-10-12 DIAGNOSIS — L02411 Cutaneous abscess of right axilla: Secondary | ICD-10-CM | POA: Diagnosis present

## 2015-10-12 DIAGNOSIS — E119 Type 2 diabetes mellitus without complications: Secondary | ICD-10-CM | POA: Diagnosis not present

## 2015-10-12 DIAGNOSIS — F32A Depression, unspecified: Secondary | ICD-10-CM | POA: Diagnosis present

## 2015-10-12 DIAGNOSIS — F329 Major depressive disorder, single episode, unspecified: Secondary | ICD-10-CM | POA: Diagnosis present

## 2015-10-12 DIAGNOSIS — L02419 Cutaneous abscess of limb, unspecified: Secondary | ICD-10-CM

## 2015-10-12 DIAGNOSIS — D638 Anemia in other chronic diseases classified elsewhere: Secondary | ICD-10-CM | POA: Diagnosis present

## 2015-10-12 DIAGNOSIS — R7881 Bacteremia: Secondary | ICD-10-CM

## 2015-10-12 DIAGNOSIS — N3000 Acute cystitis without hematuria: Secondary | ICD-10-CM | POA: Diagnosis present

## 2015-10-12 DIAGNOSIS — E876 Hypokalemia: Secondary | ICD-10-CM | POA: Diagnosis present

## 2015-10-12 DIAGNOSIS — E1065 Type 1 diabetes mellitus with hyperglycemia: Secondary | ICD-10-CM | POA: Diagnosis present

## 2015-10-12 DIAGNOSIS — E1022 Type 1 diabetes mellitus with diabetic chronic kidney disease: Secondary | ICD-10-CM | POA: Diagnosis present

## 2015-10-12 DIAGNOSIS — N390 Urinary tract infection, site not specified: Secondary | ICD-10-CM | POA: Diagnosis present

## 2015-10-12 DIAGNOSIS — F17210 Nicotine dependence, cigarettes, uncomplicated: Secondary | ICD-10-CM | POA: Diagnosis present

## 2015-10-12 DIAGNOSIS — IMO0001 Reserved for inherently not codable concepts without codable children: Secondary | ICD-10-CM

## 2015-10-12 DIAGNOSIS — A4150 Gram-negative sepsis, unspecified: Principal | ICD-10-CM | POA: Diagnosis present

## 2015-10-12 DIAGNOSIS — Z794 Long term (current) use of insulin: Secondary | ICD-10-CM

## 2015-10-12 DIAGNOSIS — E1010 Type 1 diabetes mellitus with ketoacidosis without coma: Secondary | ICD-10-CM

## 2015-10-12 DIAGNOSIS — L0291 Cutaneous abscess, unspecified: Secondary | ICD-10-CM | POA: Diagnosis not present

## 2015-10-12 DIAGNOSIS — L0390 Cellulitis, unspecified: Secondary | ICD-10-CM | POA: Diagnosis not present

## 2015-10-12 DIAGNOSIS — N184 Chronic kidney disease, stage 4 (severe): Secondary | ICD-10-CM | POA: Diagnosis present

## 2015-10-12 DIAGNOSIS — E785 Hyperlipidemia, unspecified: Secondary | ICD-10-CM | POA: Diagnosis present

## 2015-10-12 DIAGNOSIS — L03111 Cellulitis of right axilla: Secondary | ICD-10-CM | POA: Diagnosis present

## 2015-10-12 DIAGNOSIS — D649 Anemia, unspecified: Secondary | ICD-10-CM | POA: Diagnosis present

## 2015-10-12 DIAGNOSIS — N3001 Acute cystitis with hematuria: Secondary | ICD-10-CM

## 2015-10-12 LAB — BASIC METABOLIC PANEL
ANION GAP: 16 — AB (ref 5–15)
BUN: 82 mg/dL — ABNORMAL HIGH (ref 6–20)
CHLORIDE: 90 mmol/L — AB (ref 101–111)
CO2: 19 mmol/L — ABNORMAL LOW (ref 22–32)
Calcium: 8.5 mg/dL — ABNORMAL LOW (ref 8.9–10.3)
Creatinine, Ser: 2.75 mg/dL — ABNORMAL HIGH (ref 0.44–1.00)
GFR calc non Af Amer: 18 mL/min — ABNORMAL LOW (ref >60)
GFR, EST AFRICAN AMERICAN: 20 mL/min — AB (ref >60)
Glucose, Bld: 557 mg/dL (ref 65–99)
POTASSIUM: 4.2 mmol/L (ref 3.5–5.1)
SODIUM: 125 mmol/L — AB (ref 135–145)

## 2015-10-12 LAB — GLUCOSE, CAPILLARY: GLUCOSE-CAPILLARY: 339 mg/dL — AB (ref 65–99)

## 2015-10-12 LAB — CBC WITH DIFFERENTIAL/PLATELET
BASOS ABS: 0 10*3/uL (ref 0.0–0.1)
Basophils Relative: 0 %
Eosinophils Absolute: 0 10*3/uL (ref 0.0–0.7)
Eosinophils Relative: 0 %
HCT: 24.8 % — ABNORMAL LOW (ref 36.0–46.0)
HEMOGLOBIN: 8.8 g/dL — AB (ref 12.0–15.0)
LYMPHS PCT: 4 %
Lymphs Abs: 0.6 10*3/uL — ABNORMAL LOW (ref 0.7–4.0)
MCH: 33 pg (ref 26.0–34.0)
MCHC: 35.5 g/dL (ref 30.0–36.0)
MCV: 92.9 fL (ref 78.0–100.0)
MONOS PCT: 6 %
Monocytes Absolute: 1 10*3/uL (ref 0.1–1.0)
NEUTROS ABS: 14.3 10*3/uL — AB (ref 1.7–7.7)
Neutrophils Relative %: 90 %
Platelets: 420 10*3/uL — ABNORMAL HIGH (ref 150–400)
RBC: 2.67 MIL/uL — ABNORMAL LOW (ref 3.87–5.11)
RDW: 15.1 % (ref 11.5–15.5)
WBC: 15.9 10*3/uL — ABNORMAL HIGH (ref 4.0–10.5)

## 2015-10-12 LAB — URINALYSIS, ROUTINE W REFLEX MICROSCOPIC
Bilirubin Urine: NEGATIVE
Glucose, UA: >1000 mg/dL — AB
Ketones, ur: NEGATIVE mg/dL
NITRITE: NEGATIVE
SPECIFIC GRAVITY, URINE: 1.015 (ref 1.005–1.030)
pH: 5.5 (ref 5.0–8.0)

## 2015-10-12 LAB — URINE MICROSCOPIC-ADD ON

## 2015-10-12 LAB — LACTIC ACID, PLASMA
Lactic Acid, Venous: 3 mmol/L (ref 0.5–2.0)
Lactic Acid, Venous: 2.2 mmol/L (ref 0.5–2.0)

## 2015-10-12 LAB — SODIUM, URINE, RANDOM: SODIUM UR: 12 mmol/L

## 2015-10-12 LAB — CREATININE, URINE, RANDOM: Creatinine, Urine: 66.97 mg/dL

## 2015-10-12 LAB — CBG MONITORING, ED: GLUCOSE-CAPILLARY: 383 mg/dL — AB (ref 65–99)

## 2015-10-12 MED ORDER — ACETAMINOPHEN 325 MG PO TABS: 650.0000 mg | ORAL_TABLET | Freq: Four times a day (QID) | ORAL | Status: DC | PRN

## 2015-10-12 MED ORDER — POVIDONE-IODINE 10 % EX SOLN
CUTANEOUS | Status: AC
  Filled 2015-10-12: qty 118

## 2015-10-12 MED ORDER — ACETAMINOPHEN 650 MG RE SUPP: 650.0000 mg | Freq: Four times a day (QID) | RECTAL | Status: DC | PRN

## 2015-10-12 MED ORDER — HEPARIN SODIUM (PORCINE) 5000 UNIT/ML IJ SOLN
5000.0000 [IU] | Freq: Three times a day (TID) | INTRAMUSCULAR | Status: DC
  Administered 2015-10-12 – 2015-10-19 (×20): 5000 [IU] via SUBCUTANEOUS
  Filled 2015-10-12 (×21): qty 1

## 2015-10-12 MED ORDER — ALBUTEROL SULFATE (2.5 MG/3ML) 0.083% IN NEBU: 2.5000 mg | INHALATION_SOLUTION | RESPIRATORY_TRACT | Status: DC | PRN

## 2015-10-12 MED ORDER — SODIUM CHLORIDE 0.9 % IV BOLUS (SEPSIS)
500.0000 mL | Freq: Once | INTRAVENOUS | Status: AC
  Administered 2015-10-12: 500 mL via INTRAVENOUS

## 2015-10-12 MED ORDER — HYDROCODONE-ACETAMINOPHEN 5-325 MG PO TABS
1.0000 | ORAL_TABLET | Freq: Four times a day (QID) | ORAL | Status: DC | PRN
  Administered 2015-10-12 – 2015-10-13 (×2): 2 via ORAL
  Administered 2015-10-14: 1 via ORAL
  Administered 2015-10-14: 2 via ORAL
  Administered 2015-10-15: 1 via ORAL
  Administered 2015-10-15: 2 via ORAL
  Administered 2015-10-16 – 2015-10-17 (×3): 1 via ORAL
  Administered 2015-10-17: 2 via ORAL
  Administered 2015-10-18: 1 via ORAL
  Administered 2015-10-19: 2 via ORAL
  Filled 2015-10-12: qty 2
  Filled 2015-10-12 (×3): qty 1
  Filled 2015-10-12 (×2): qty 2
  Filled 2015-10-12: qty 1
  Filled 2015-10-12: qty 2
  Filled 2015-10-12 (×2): qty 1
  Filled 2015-10-12 (×2): qty 2

## 2015-10-12 MED ORDER — ONDANSETRON HCL 4 MG PO TABS: 4.0000 mg | ORAL_TABLET | Freq: Four times a day (QID) | ORAL | Status: DC | PRN

## 2015-10-12 MED ORDER — LIDOCAINE-EPINEPHRINE (PF) 1 %-1:200000 IJ SOLN
INTRAMUSCULAR | Status: AC
  Administered 2015-10-12: 16:00:00
  Filled 2015-10-12: qty 30

## 2015-10-12 MED ORDER — INSULIN ASPART 100 UNIT/ML ~~LOC~~ SOLN: 0.0000 [IU] | SUBCUTANEOUS | Status: DC

## 2015-10-12 MED ORDER — ONDANSETRON HCL 4 MG/2ML IJ SOLN
4.0000 mg | Freq: Four times a day (QID) | INTRAMUSCULAR | Status: DC | PRN
Start: 2015-10-12 — End: 2015-10-19
  Administered 2015-10-16: 4 mg via INTRAVENOUS
  Filled 2015-10-12: qty 2

## 2015-10-12 MED ORDER — INSULIN ASPART 100 UNIT/ML ~~LOC~~ SOLN
0.0000 [IU] | SUBCUTANEOUS | Status: DC
  Administered 2015-10-13: 3 [IU] via SUBCUTANEOUS
  Administered 2015-10-13: 1 [IU] via SUBCUTANEOUS
  Administered 2015-10-13: 9 [IU] via SUBCUTANEOUS
  Administered 2015-10-14 (×4): 3 [IU] via SUBCUTANEOUS
  Administered 2015-10-14: 2 [IU] via SUBCUTANEOUS
  Administered 2015-10-15: 6 [IU] via SUBCUTANEOUS
  Administered 2015-10-15 (×2): 2 [IU] via SUBCUTANEOUS
  Administered 2015-10-16: 1 [IU] via SUBCUTANEOUS
  Administered 2015-10-16: 3 [IU] via SUBCUTANEOUS
  Administered 2015-10-16: 1 [IU] via SUBCUTANEOUS
  Administered 2015-10-16: 2 [IU] via SUBCUTANEOUS
  Administered 2015-10-17: 3 [IU] via SUBCUTANEOUS
  Administered 2015-10-17 – 2015-10-18 (×2): 1 [IU] via SUBCUTANEOUS
  Administered 2015-10-18: 2 [IU] via SUBCUTANEOUS
  Administered 2015-10-18: 1 [IU] via SUBCUTANEOUS
  Administered 2015-10-19: 5 [IU] via SUBCUTANEOUS

## 2015-10-12 MED ORDER — PRAVASTATIN SODIUM 40 MG PO TABS
40.0000 mg | ORAL_TABLET | Freq: Every day | ORAL | Status: DC
  Administered 2015-10-12 – 2015-10-18 (×7): 40 mg via ORAL
  Filled 2015-10-12 (×7): qty 1

## 2015-10-12 MED ORDER — SODIUM CHLORIDE 0.9 % IV SOLN: INTRAVENOUS | Status: DC

## 2015-10-12 MED ORDER — SODIUM CHLORIDE 0.9 % IV SOLN
INTRAVENOUS | Status: DC | PRN
  Filled 2015-10-12: qty 2.5

## 2015-10-12 MED ORDER — INSULIN GLARGINE 100 UNIT/ML ~~LOC~~ SOLN
18.0000 [IU] | Freq: Every day | SUBCUTANEOUS | Status: DC
  Administered 2015-10-12 – 2015-10-16 (×4): 18 [IU] via SUBCUTANEOUS
  Filled 2015-10-12 (×5): qty 0.18

## 2015-10-12 MED ORDER — SODIUM CHLORIDE 0.9% FLUSH
3.0000 mL | Freq: Two times a day (BID) | INTRAVENOUS | Status: DC
  Administered 2015-10-12 – 2015-10-18 (×12): 3 mL via INTRAVENOUS

## 2015-10-12 MED ORDER — ASPIRIN EC 325 MG PO TBEC
325.0000 mg | DELAYED_RELEASE_TABLET | Freq: Every day | ORAL | Status: DC
  Administered 2015-10-12 – 2015-10-19 (×8): 325 mg via ORAL
  Filled 2015-10-12 (×8): qty 1

## 2015-10-12 MED ORDER — FLUCONAZOLE IN SODIUM CHLORIDE 200-0.9 MG/100ML-% IV SOLN
200.0000 mg | INTRAVENOUS | Status: DC
  Administered 2015-10-13 – 2015-10-18 (×7): 200 mg via INTRAVENOUS
  Filled 2015-10-12 (×8): qty 100

## 2015-10-12 MED ORDER — METOPROLOL TARTRATE 25 MG PO TABS
25.0000 mg | ORAL_TABLET | Freq: Two times a day (BID) | ORAL | Status: DC
  Administered 2015-10-13 – 2015-10-19 (×10): 25 mg via ORAL
  Filled 2015-10-12 (×13): qty 1

## 2015-10-12 MED ORDER — SODIUM CHLORIDE 0.9 % IV SOLN
Freq: Once | INTRAVENOUS | Status: AC
  Administered 2015-10-12: 19:00:00 via INTRAVENOUS

## 2015-10-12 MED ORDER — SODIUM CHLORIDE 0.9 % IV SOLN
Freq: Once | INTRAVENOUS | Status: AC
  Administered 2015-10-12: 21:00:00 via INTRAVENOUS

## 2015-10-12 MED ORDER — FENTANYL CITRATE (PF) 100 MCG/2ML IJ SOLN
50.0000 ug | Freq: Once | INTRAMUSCULAR | Status: AC
  Administered 2015-10-12: 50 ug via INTRAVENOUS
  Filled 2015-10-12: qty 2

## 2015-10-12 MED ORDER — PIPERACILLIN-TAZOBACTAM 3.375 G IVPB
3.3750 g | Freq: Once | INTRAVENOUS | Status: AC
  Administered 2015-10-12: 3.375 g via INTRAVENOUS
  Filled 2015-10-12: qty 50

## 2015-10-12 MED ORDER — INSULIN REGULAR BOLUS VIA INFUSION
0.0000 [IU] | Freq: Three times a day (TID) | INTRAVENOUS | Status: DC | PRN
  Filled 2015-10-12: qty 10

## 2015-10-12 MED ORDER — VANCOMYCIN HCL 10 G IV SOLR
1500.0000 mg | Freq: Once | INTRAVENOUS | Status: AC
  Administered 2015-10-12: 1500 mg via INTRAVENOUS
  Filled 2015-10-12: qty 1500

## 2015-10-12 MED ORDER — PIPERACILLIN-TAZOBACTAM 3.375 G IVPB
3.3750 g | Freq: Three times a day (TID) | INTRAVENOUS | Status: DC
  Administered 2015-10-13 – 2015-10-16 (×10): 3.375 g via INTRAVENOUS
  Filled 2015-10-12 (×9): qty 50

## 2015-10-12 MED ORDER — FLUOXETINE HCL 20 MG PO CAPS
20.0000 mg | ORAL_CAPSULE | Freq: Two times a day (BID) | ORAL | Status: DC
  Administered 2015-10-12 – 2015-10-19 (×14): 20 mg via ORAL
  Filled 2015-10-12 (×14): qty 1

## 2015-10-12 NOTE — ED Notes (Signed)
Dr Adriana Simasook and hospitalist Notified of CBG 383. Will discontinue glucose stabilizer

## 2015-10-12 NOTE — ED Notes (Addendum)
Pt brought in by EMS from home. Has abscess under right arm x 1 week. Pt reports that it started draining 4 days ago and foul odor. CBG 543 per EMS

## 2015-10-12 NOTE — Progress Notes (Signed)
ANTIBIOTIC CONSULT NOTE-Preliminary  Pharmacy Consult for VANCOMYCIN Indication: wound abscess  Allergies  Allergen Reactions  . Other     Per pt some type of mycin "some antibiotic sent me into renal failure."   Patient Measurements: Height: 5\' 2"  (157.5 cm) Weight: 192 lb (87.091 kg) IBW/kg (Calculated) : 50.1  Vital Signs: Temp: 98.1 F (36.7 C) (05/04 2042) Temp Source: Oral (05/04 2042) BP: 91/44 mmHg (05/04 2042) Pulse Rate: 91 (05/04 2042)  Labs:  Recent Labs  10/12/15 1530 10/12/15 1820  WBC 15.9*  --   HGB 8.8*  --   PLT 420*  --   LABCREA  --  66.97  CREATININE 2.75*  --     Estimated Creatinine Clearance: 22.3 mL/min (by C-G formula based on Cr of 2.75).  No results for input(s): VANCOTROUGH, VANCOPEAK, VANCORANDOM, GENTTROUGH, GENTPEAK, GENTRANDOM, TOBRATROUGH, TOBRAPEAK, TOBRARND, AMIKACINPEAK, AMIKACINTROU, AMIKACIN in the last 72 hours.   Microbiology: Recent Results (from the past 720 hour(s))  Culture, blood (routine x 2)     Status: None (Preliminary result)   Collection Time: 10/12/15  6:07 PM  Result Value Ref Range Status   Specimen Description BLOOD RIGHT ARM  Final   Special Requests BOTTLES DRAWN AEROBIC AND ANAEROBIC 6CC  Final   Culture PENDING  Incomplete   Report Status PENDING  Incomplete  Culture, blood (routine x 2)     Status: None (Preliminary result)   Collection Time: 10/12/15 10:23 PM  Result Value Ref Range Status   Specimen Description BLOOD RIGHT HAND  Final   Special Requests BOTTLES DRAWN AEROBIC AND ANAEROBIC 6CC  Final   Culture PENDING  Incomplete   Report Status PENDING  Incomplete    Medical History: Past Medical History  Diagnosis Date  . Diabetes mellitus without complication (HCC)   . Hypertension    Assessment: Brought by EMS to ED w/ c/o abscess under right arm, draining with foul odor.  Asked to initiate Vancomycin  Plan:  Preliminary review of pertinent patient information completed.  Protocol will  be initiated with a one-time dose(s) of Vancomycin 1500mg .  (will not need any additional doses tonight) Jeani HawkingAnnie Penn clinical pharmacist will complete review during morning rounds to assess patient and finalize treatment regimen.   Addendum:  Asked to add Zosyn 3.375gm IV q8h, clcr > [address]20    Rabia Argote A, [facility] San AntonioRPH 10/12/2015,11:02 PM

## 2015-10-12 NOTE — ED Notes (Signed)
CRITICAL VALUE ALERT  Critical value received:  Glucose 557  Date of notification:  10/12/15  Time of notification:  1710  Critical value read back: yes Nurse who received alert:  Tonia Ghentobin Knowles  MD notified Dr Adriana Simascook

## 2015-10-12 NOTE — ED Provider Notes (Addendum)
CSN: [id number]     Arrival date & time 10/12/15  1444 History   First MD Initiated Contact with Patient 10/12/15 1505     Chief Complaint  Patient presents with  . Abscess     (Consider location/radiation/quality/duration/timing/severity/associated sxs/prior Treatment) HPI....Marland KitchenMarland KitchenLevel 5 caveat for urgent need for intervention.   Patient has had a festering right axillary abscess for 1 week. She is on an unknown antibiotic. No fever, sweats, or chills. Abscess has been draining a purulent liquid.  She is a type I diabetic.  Past Medical History  Diagnosis Date  . Diabetes mellitus without complication (HCC)   . Hypertension    Past Surgical History  Procedure Laterality Date  . Incision and drainage abscess Right 08/08/2015    Procedure: Irrigation and Debridement of Right Lower Ext.;  Surgeon: Axel Filler, MD;  Location: [facility] OR;  Service: General;  Laterality: Right;  . Incision and drainage Right 08/09/2015    Procedure: INCISION AND DRAINAGE;  Surgeon: Rodman Pickle, MD;  Location: [facility] OR;  Service: General;  Laterality: Right;   No family history on file. Social History  Substance Use Topics  . Smoking status: Current Some Day Smoker -- 0.50 packs/day    Types: Cigarettes  . Smokeless tobacco: None  . Alcohol Use: Yes     Comment: occ.   OB History    No data available     Review of Systems  Reason unable to perform ROS: Urgent need for intervention.      Allergies  Other  Home Medications   Prior to Admission medications   Medication Sig Start Date End Date Taking? Authorizing Provider  albuterol (PROAIR HFA) 108 (90 Base) MCG/ACT inhaler Inhale 2 puffs into the lungs every 6 (six) hours as needed for wheezing or shortness of breath.   Yes Historical Provider, MD  aspirin EC 81 MG tablet Take 81 mg by mouth daily.   Yes Historical Provider, MD  clobetasol cream (TEMOVATE) 0.05 % Apply topically 2 (two) times daily. 09/21/15  Yes Leroy Sea, MD   FLUoxetine (PROZAC) 20 MG capsule Take 20 mg by mouth 2 (two) times daily.  05/11/15  Yes Historical Provider, MD  insulin aspart (NOVOLOG FLEXPEN) 100 UNIT/ML FlexPen Inject 3 Units into the skin 3 (three) times daily with meals.   Yes Historical Provider, MD  insulin glargine (LANTUS) 100 UNIT/ML injection Inject 0.18 mLs (18 Units total) into the skin 2 (two) times daily. Patient taking differently: Inject 18 Units into the skin at bedtime.  08/15/15  Yes Leroy Sea, MD  metoprolol tartrate (LOPRESSOR) 25 MG tablet Take 1 tablet (25 mg total) by mouth 2 (two) times daily. 09/21/15  Yes Leroy Sea, MD  pravastatin (PRAVACHOL) 40 MG tablet Take 40 mg by mouth at bedtime.  05/11/15  Yes Historical Provider, MD  diphenhydrAMINE (BENADRYL) 25 mg capsule Take 1 capsule (25 mg total) by mouth every 6 (six) hours as needed for itching. Patient not taking: Reported on 10/12/2015 09/21/15   Leroy Sea, MD  insulin aspart (NOVOLOG) 100 UNIT/ML injection Before each meal 3 times a day, 140-199 - 2 units, 200-250 - 4 units, 251-299 - 6 units,  300-349 - 8 units,  350 or above 10 units. Patient not taking: Reported on 09/09/2015 08/15/15   Leroy Sea, MD  oxyCODONE (OXY IR/ROXICODONE) 5 MG immediate release tablet Take 1-2 tablets (5-10 mg total) by mouth every 4 (four) hours as needed for moderate pain. Patient not  taking: Reported on 09/09/2015 08/15/15   Leroy Sea, MD   BP 119/67 mmHg  Pulse 97  Temp(Src) 98.5 F (36.9 C)  Resp 20  Ht 5\' 2"  (1.575 m)  Wt 210 lb (95.255 kg)  BMI 38.40 kg/m2  SpO2 100% Physical Exam  Constitutional: She is oriented to person, place, and time.  Pale, alert  HENT:  Head: Normocephalic and atraumatic.  Eyes: Conjunctivae and EOM are normal. Pupils are equal, round, and reactive to light.  Neck: Normal range of motion. Neck supple.  Cardiovascular: Normal rate and regular rhythm.   Pulmonary/Chest: Effort normal and breath sounds normal.  Abdominal:  Soft. Bowel sounds are normal.  Musculoskeletal: Normal range of motion.  Neurological: She is alert and oriented to person, place, and time.  Skin:  12 x 7 cm right axillary abscess draining purulent material  Psychiatric: She has a normal mood and affect. Her behavior is normal.  Nursing note and vitals reviewed.   ED Course  .Marland KitchenIncision and Drainage Date/Time: 10/12/2015 2:00 PM Performed by: Donnetta Hutching Authorized by: Donnetta Hutching Consent: The procedure was performed in an emergent situation. Verbal consent obtained. Risks and benefits: risks, benefits and alternatives were discussed Consent given by: patient Patient understanding: patient states understanding of the procedure being performed Comments: 1600: Large 12 x 7 cm right axillary abscess. Wound anesthetized 1% Xylocaine with epinephrine Times 7 mL. Several stab wounds made with a moderate amount of purulent drainage. Necrotic tissue debrided.   (including critical care time) Labs Review Labs Reviewed  CBC WITH DIFFERENTIAL/PLATELET - Abnormal; Notable for the following:    WBC 15.9 (*)    RBC 2.67 (*)    Hemoglobin 8.8 (*)    HCT 24.8 (*)    Platelets 420 (*)    Neutro Abs 14.3 (*)    Lymphs Abs 0.6 (*)    All other components within normal limits  BASIC METABOLIC PANEL - Abnormal; Notable for the following:    Sodium 125 (*)    Chloride 90 (*)    CO2 19 (*)    Glucose, Bld 557 (*)    BUN 82 (*)    Creatinine, Ser 2.75 (*)    Calcium 8.5 (*)    GFR calc non Af Amer 18 (*)    GFR calc Af Amer 20 (*)    Anion gap 16 (*)    All other components within normal limits  CULTURE, ROUTINE-ABSCESS  BASIC METABOLIC PANEL    Imaging Review No results found. I have personally reviewed and evaluated these images and lab results as part of my medical decision-making.   EKG Interpretation None     CRITICAL CARE Performed by: Donnetta Hutching Total critical care time: 30 minutes Critical care time was exclusive of  separately billable procedures and treating other patients. Critical care was necessary to treat or prevent imminent or life-threatening deterioration. Critical care was time spent personally by me on the following activities: development of treatment plan with patient and/or surrogate as well as nursing, discussions with consultants, evaluation of patient's response to treatment, examination of patient, obtaining history from patient or surrogate, ordering and performing treatments and interventions, ordering and review of laboratory studies, ordering and review of radiographic studies, pulse oximetry and re-evaluation of patient's condition. MDM   Final diagnoses:  Axillary abscess  Diabetic ketoacidosis without coma associated with type 1 diabetes mellitus (HCC)    Patient has significant right axillary abscess. Incision and drainage attempted in the emergency department. Wound is  very indurated with less than suspected yield from procedure. She is also in DKA. Glucose stabilizer initiated.  1815:   Discussed with general surgeon Dr. Franky MachoMark Jenkins. He will consult in the morning.   Donnetta HutchingBrian Samanvitha Germany, MD 10/12/15 1749  Donnetta HutchingBrian Kesean Serviss, MD 10/12/15 [phone]

## 2015-10-12 NOTE — ED Notes (Addendum)
CRITICAL VALUE ALERT  Critical value received: lactic acid-3.0  Date of notification:  10/12/15  Time of notification:  1900  Critical value read back:Yes.    Nurse who received alert:  Meridee Scoreharlotte Rithik Odea, RN  MD notified (1st page):  1904  Time of first page:  1904  MD notified (2nd page):  Time of second page:  Responding MD:  Dr Katrinka BlazingSmith  Time MD responded: [phone]

## 2015-10-12 NOTE — Progress Notes (Signed)
ANTIBIOTIC CONSULT NOTE-Preliminary  Pharmacy Consult for VANCOMYCIN Indication: wound abscess  Allergies  Allergen Reactions  . Other     Per pt some type of mycin "some antibiotic sent me into renal failure."   Patient Measurements: Height: 5\' 2"  (157.5 cm) Weight: 210 lb (95.255 kg) IBW/kg (Calculated) : 50.1  Vital Signs: Temp: 98.5 F (36.9 C) (05/04 1433) BP: 119/67 mmHg (05/04 1433) Pulse Rate: 97 (05/04 1433)  Labs: No results for input(s): WBC, HGB, PLT, LABCREA, CREATININE in the last 72 hours.  Estimated Creatinine Clearance: 18.8 mL/min (by C-G formula based on Cr of 3.43).  No results for input(s): VANCOTROUGH, VANCOPEAK, VANCORANDOM, GENTTROUGH, GENTPEAK, GENTRANDOM, TOBRATROUGH, TOBRAPEAK, TOBRARND, AMIKACINPEAK, AMIKACINTROU, AMIKACIN in the last 72 hours.   Microbiology: No results found for this or any previous visit (from the past 720 hour(s)).  Medical History: Past Medical History  Diagnosis Date  . Diabetes mellitus without complication (HCC)   . Hypertension    Assessment: Brought by EMS to ED w/ c/o abscess under right arm, draining with foul odor.  Asked to initiate Vancomycin  Plan:  Preliminary review of pertinent patient information completed.  Protocol will be initiated with a one-time dose(s) of Vancomycin 1500mg .  (will not need any additional doses tonight) Jeani HawkingAnnie Penn clinical pharmacist will complete review during morning rounds to assess patient and finalize treatment regimen.  Valrie HartHall, Dela Sweeny A, RPH 10/12/2015,4:25 PM

## 2015-10-12 NOTE — H&P (Signed)
History and Physical    [name] ZOX:[id number] DOB: July 25, 1955 DOA: 10/12/2015  Referring MD/NP/PA: Dr. Adriana [name] PCP: Sophia BostonAPPER,[name], MD  Outpatient Specialists: \ Patient coming from: Home  Chief Complaint: Boil my armpit  HPI: [name] is a 60 y.o. female with medical history significant of diabetes mellitus type 2, boils, and HTN; who presents with complaints of a boil of her right axilla. She reports first noting it about 1 week ago. She initially thought that it would come to a head , but it never did. 4 days ago she notes that it started draining puslike fluid that was very foul-smelling. She notes sharp pain underneath armpit. Denies having any fevers, chills, shortness of breath, changes in weight, lower extremity leg swelling, or chest pain. Associated symptoms include increased urinary frequency, polydipsia, and poor wound healing. She reports having similar issues with a left thigh wound for which she's had home health services that come and change the dressings every other day. She notes that her diabetes is not very well controlled and she states she is supposed to check her blood sugars 3 times daily, but checks it 2 times daily. She reports associated symptoms of hypoglycemia with blood sugars in the 50s previously.   ED Course: Upon admission into the emergency department patient was evaluated and seen to to be afebrile, with blood pressures as low as 90/45, and other vital signs within normal limits. Labwork revealed WBC of 15.9, hemoglobin 8.8, platelets of 240, sodium 125, potassium 4.2, chloride 90 CO2 19, BUN 82, creatinine 2.75, calcium 8.5, glucose 557. While in the emergency department she underwent I&D of the the right axilla and was initially placed on vancomycin. After patient was initially admitted urinalysis only obtained and resulted showed many bacteria and yeast.  Review of Systems: As per HPI otherwise 10 point review of systems negative.     Past Medical History  Diagnosis Date  . Diabetes mellitus without complication (HCC)   . Hypertension     Past Surgical History  Procedure Laterality Date  . Incision and drainage abscess Right 08/08/2015    Procedure: Irrigation and Debridement of Right Lower Ext.;  Surgeon: Axel FillerArmando Ramirez, MD;  Location: [facility] OR;  Service: General;  Laterality: Right;  . Incision and drainage Right 08/09/2015    Procedure: INCISION AND DRAINAGE;  Surgeon: De BlanchLuke Aaron Kinsinger, MD;  [facility];  Service: General;  Laterality: Right;     reports that she has been smoking Cigarettes.  She has been smoking about 0.50 packs per day. She does not have any smokeless tobacco history on file. She reports that she drinks alcohol. She reports that she does not use illicit drugs.  Allergies  Allergen Reactions  . Other     Per pt some type of mycin "some antibiotic sent me into renal failure."    No family history on file.  Prior to Admission medications   Medication Sig Start Date End Date Taking? Authorizing Provider  albuterol (PROAIR HFA) 108 (90 Base) MCG/ACT inhaler Inhale 2 puffs into the lungs every 6 (six) hours as needed for wheezing or shortness of breath.   Yes Historical Provider, MD  aspirin EC 81 MG tablet Take 81 mg by mouth daily.   Yes Historical Provider, MD  clobetasol cream (TEMOVATE) 0.05 % Apply topically 2 (two) times daily. 09/21/15  Yes Leroy SeaPrashant K Singh, MD  FLUoxetine (PROZAC) 20 MG capsule Take 20 mg by mouth 2 (two) times daily.  05/11/15  Yes  Historical Provider, MD  insulin aspart (NOVOLOG FLEXPEN) 100 UNIT/ML FlexPen Inject 3 Units into the skin 3 (three) times daily with meals.   Yes Historical Provider, MD  insulin glargine (LANTUS) 100 UNIT/ML injection Inject 0.18 mLs (18 Units total) into the skin 2 (two) times daily. Patient taking differently: Inject 18 Units into the skin at bedtime.  08/15/15  Yes Leroy Sea, MD  metoprolol tartrate (LOPRESSOR) 25 MG tablet  Take 1 tablet (25 mg total) by mouth 2 (two) times daily. 09/21/15  Yes Leroy Sea, MD  pravastatin (PRAVACHOL) 40 MG tablet Take 40 mg by mouth at bedtime.  05/11/15  Yes Historical Provider, MD  diphenhydrAMINE (BENADRYL) 25 mg capsule Take 1 capsule (25 mg total) by mouth every 6 (six) hours as needed for itching. Patient not taking: Reported on 10/12/2015 09/21/15   Leroy Sea, MD  insulin aspart (NOVOLOG) 100 UNIT/ML injection Before each meal 3 times a day, 140-199 - 2 units, 200-250 - 4 units, 251-299 - 6 units,  300-349 - 8 units,  350 or above 10 units. Patient not taking: Reported on 09/09/2015 08/15/15   Leroy Sea, MD  oxyCODONE (OXY IR/ROXICODONE) 5 MG immediate release tablet Take 1-2 tablets (5-10 mg total) by mouth every 4 (four) hours as needed for moderate pain. Patient not taking: Reported on 09/09/2015 08/15/15   Leroy Sea, MD    Physical Exam: Filed Vitals:   10/12/15 1433 10/12/15 1730  BP: 119/67 90/45  Pulse: 97 84  Temp: 98.5 F (36.9 C)   Resp: 20   Height: 5\' 2"  (1.575 m)   Weight: 95.255 kg (210 lb)   SpO2: 100% 100%      Constitutional: Chronically ill appearing obese female Filed Vitals:   10/12/15 1433 10/12/15 1730  BP: 119/67 90/45  Pulse: 97 84  Temp: 98.5 F (36.9 C)   Resp: 20   Height: 5\' 2"  (1.575 m)   Weight: 95.255 kg (210 lb)   SpO2: 100% 100%   Eyes: PERRL, lids and conjunctivae normal ENMT: Mucous membranes are moist. Posterior pharynx clear of any exudate or lesions.Normal dentition.  Neck: normal, supple, no masses, no thyromegaly Respiratory: clear to auscultation bilaterally, no wheezing, no crackles. Normal respiratory effort. No accessory muscle use.  Cardiovascular: Regular rate and rhythm, no murmurs / rubs / gallops. No extremity edema. 2+ pedal pulses. No carotid bruits.  Abdomen: no tenderness, no masses palpated. No hepatosplenomegaly. Bowel sounds positive.  Musculoskeletal: no clubbing / cyanosis. No joint  deformity upper and lower extremities. Good ROM, no contractures. Normal muscle tone.  Skin: Acanthosis nigricans present of the neck. Abscess I&D'd of the right axilla with surrounding erythema and purulent foul smelling drainage. Right thigh wound healing. Neurologic: CN 2-12 grossly intact. Sensation intact, DTR normal. Strength 5/5 in all 4.  Psychiatric: Normal judgment and insight. Alert and oriented x 3. Normal mood.     Labs on Admission: I have personally reviewed following labs and imaging studies  CBC:  Recent Labs Lab 10/12/15 1530  WBC 15.9*  NEUTROABS 14.3*  HGB 8.8*  HCT 24.8*  MCV 92.9  PLT 420*   Basic Metabolic Panel:  Recent Labs Lab 10/12/15 1530  NA 125*  K 4.2  CL 90*  CO2 19*  GLUCOSE 557*  BUN 82*  CREATININE 2.75*  CALCIUM 8.5*   GFR: Estimated Creatinine Clearance: 23.4 mL/min (by C-G formula based on Cr of 2.75). Liver Function Tests: No results for input(s): AST, ALT,  ALKPHOS, BILITOT, PROT, ALBUMIN in the last 168 hours. No results for input(s): LIPASE, AMYLASE in the last 168 hours. No results for input(s): AMMONIA in the last 168 hours. Coagulation Profile: No results for input(s): INR, PROTIME in the last 168 hours. Cardiac Enzymes: No results for input(s): CKTOTAL, CKMB, CKMBINDEX, TROPONINI in the last 168 hours. BNP (last 3 results) No results for input(s): PROBNP in the last 8760 hours. HbA1C: No results for input(s): HGBA1C in the last 72 hours. CBG: No results for input(s): GLUCAP in the last 168 hours. Lipid Profile: No results for input(s): CHOL, HDL, LDLCALC, TRIG, CHOLHDL, LDLDIRECT in the last 72 hours. Thyroid Function Tests: No results for input(s): TSH, T4TOTAL, FREET4, T3FREE, THYROIDAB in the last 72 hours. Anemia Panel: No results for input(s): VITAMINB12, FOLATE, FERRITIN, TIBC, IRON, RETICCTPCT in the last 72 hours. Urine analysis:    Component Value Date/Time   COLORURINE YELLOW 09/27/2015 1736    APPEARANCEUR CLEAR 09/27/2015 1736   LABSPEC >1.030* 09/27/2015 1736   PHURINE 5.5 09/27/2015 1736   GLUCOSEU NEGATIVE 09/27/2015 1736   HGBUR TRACE* 09/27/2015 1736   BILIRUBINUR NEGATIVE 09/27/2015 1736   KETONESUR NEGATIVE 09/27/2015 1736   PROTEINUR 30* 09/27/2015 1736   NITRITE NEGATIVE 09/27/2015 1736   LEUKOCYTESUR NEGATIVE 09/27/2015 1736   Sepsis Labs: (procalcitonin:4,lacticidven:4) )No results found for this or any previous visit (from the past 240 hour(s)).   Radiological Exams on Admission: No results found.   Assessment/Plan Sepsis 2/2 Abscess of the right axilla: Acute.Patient initially found to have abscess of the right axilla. Lab work revealed elevated white blood cell count of 15.9 and subsequently elevated lactic acid level of 3. Patient with low normal blood pressure. Wound  purulent foul smelling drainage. - Admit to telemetry - follow up Abscess cultures  - Check lactic acid  - Initially placed on antibiotics of vancomycin - Wound care consult - Consult to Dr. Franky Macho for possible need of further I&D   Urinary tract infection with yeast present: Urinalysis only resulted positive for many bacteria, moderate leukocytes, negative nitrates, and too numerous to count WBCs. Patient likely susceptible to urinary tract infections due to uncontrolled diabetes mellitus type 2. - added on a BX of Zosyn and Diflucan IV - Follow-up blood and urine cultures  Hyperglycemia with history of type 2 diabetes mellitus: Acute:  Last hemoglobin A1c in 09/2015 elevated at 9.7. Glucose  On admission elevated at 557 on admission. No signs of ketones and urinalysis. - Follow-up urinalysis - IVF of NS - BMPs every 4 hours x 2 - Patient would benefit from diabetic education.  Chronic kidney disease stage III- IV: Question patient's new baseline creatinine as previously one month ago was less than 2. - FeNa - f/u repeat BMP  Essential  hypertension  - Continue  metoprolol  with holding parameters   Anemia of suspected chronic disease: Hemoglobin appear to be near patient's baseline of between 7 and 9. - Repeat CBC in a.m.  Thrombocytosis: Acute likely reactive secondary to the above. - Continue to monitor  Depression - Continue Prozac  Hyperlipidemia - Continue pravastatin  DVT prophylaxis: SCDs start heparin if H&H remained stable Code Status: Full Family Communication: None Disposition Plan: Possible discharge in 2-3 days Consults called:   Admission status: Observation   Clydie Braun MD [facility] Pager [phone]  If 7PM-7AM, please contact night-coverage [email or web address] Password [facility]  10/12/2015, 5:34 PM

## 2015-10-12 NOTE — Progress Notes (Signed)
CRITICAL VALUE ALERT  Critical value received:  Lactic Acid 2.2  Date of notification:  10/12/2015  Time of notification:  9:40  Critical value read back: yes  Nurse who received alert:  Jinny Sandersisha Bullock  MD notified (1st page):  NP Schorr  Time of first page:  9:50   MD notified (2nd page):  Time of second page:  Responding MD: NP Schorr  Time MD responded:  No response

## 2015-10-12 NOTE — ED Notes (Signed)
Pt noted to have wound to right inguinal area. [facility]. Also noted to have 2 open area to left buttocks

## 2015-10-13 LAB — GLUCOSE, CAPILLARY
GLUCOSE-CAPILLARY: 217 mg/dL — AB (ref 65–99)
GLUCOSE-CAPILLARY: 51 mg/dL — AB (ref 65–99)
GLUCOSE-CAPILLARY: 51 mg/dL — AB (ref 65–99)
GLUCOSE-CAPILLARY: 76 mg/dL (ref 65–99)
GLUCOSE-CAPILLARY: 48 mg/dL — AB (ref 65–99)
GLUCOSE-CAPILLARY: 65 mg/dL (ref 65–99)
Glucose-Capillary: 132 mg/dL — ABNORMAL HIGH (ref 65–99)
Glucose-Capillary: 380 mg/dL — ABNORMAL HIGH (ref 65–99)
Glucose-Capillary: 67 mg/dL (ref 65–99)
Glucose-Capillary: 73 mg/dL (ref 65–99)
Glucose-Capillary: 88 mg/dL (ref 65–99)

## 2015-10-13 LAB — BASIC METABOLIC PANEL
Anion gap: 10 (ref 5–15)
BUN: 76 mg/dL — AB (ref 6–20)
CHLORIDE: 101 mmol/L (ref 101–111)
CO2: 21 mmol/L — ABNORMAL LOW (ref 22–32)
Calcium: 8.2 mg/dL — ABNORMAL LOW (ref 8.9–10.3)
Creatinine, Ser: 2.12 mg/dL — ABNORMAL HIGH (ref 0.44–1.00)
GFR calc Af Amer: 28 mL/min — ABNORMAL LOW (ref >60)
GFR calc non Af Amer: 24 mL/min — ABNORMAL LOW (ref >60)
GLUCOSE: 183 mg/dL — AB (ref 65–99)
POTASSIUM: 3.5 mmol/L (ref 3.5–5.1)
Sodium: 132 mmol/L — ABNORMAL LOW (ref 135–145)

## 2015-10-13 LAB — CBC
HEMATOCRIT: 26.4 % — AB (ref 36.0–46.0)
HEMOGLOBIN: 8.9 g/dL — AB (ref 12.0–15.0)
MCH: 30.7 pg (ref 26.0–34.0)
MCHC: 33.7 g/dL (ref 30.0–36.0)
MCV: 91 fL (ref 78.0–100.0)
Platelets: 353 10*3/uL (ref 150–400)
RBC: 2.9 MIL/uL — ABNORMAL LOW (ref 3.87–5.11)
RDW: 15.4 % (ref 11.5–15.5)
WBC: 16 10*3/uL — AB (ref 4.0–10.5)

## 2015-10-13 MED ORDER — VANCOMYCIN HCL 10 G IV SOLR
1250.0000 mg | INTRAVENOUS | Status: DC
  Administered 2015-10-13 – 2015-10-14 (×2): 1250 mg via INTRAVENOUS
  Filled 2015-10-13 (×3): qty 1250

## 2015-10-13 MED ORDER — FLUCONAZOLE IN SODIUM CHLORIDE 200-0.9 MG/100ML-% IV SOLN
INTRAVENOUS | Status: AC
  Filled 2015-10-13: qty 100

## 2015-10-13 NOTE — Consult Note (Signed)
Reason for Consult: Right axillary abscess Referring Physician: Dr. [name] is an 60 y.o. female.  HPI: Patient is a 60 year old black female who presented emergency room with a draining right axillary abscess. In the emergency room, further incision and drainage was performed and purulent fluid was obtained. The patient was admitted to the hospital for further management treatment. Patient was also noted to be hyperglycemic with poorly controlled diabetes mellitus. Patient states this morning that it is less tender in the right axilla. She has had skin infections in the past, though none this significant in the right axilla. She has never been told she has hidradenitis. Interestingly, she was in the hospital in March of this year for treatment of necrotizing fasciitis of the right inner thigh.  Past Medical History  Diagnosis Date  . Diabetes mellitus without complication ([place])   . Hypertension     Past Surgical History  Procedure Laterality Date  . Incision and drainage abscess Right 08/08/2015    Procedure: Irrigation and Debridement of Right Lower Ext.;  Surgeon: Ralene Ok, MD;  Location: [place];  Service: General;  Laterality: Right;  . Incision and drainage Right 08/09/2015    Procedure: INCISION AND DRAINAGE;  Surgeon: Mickeal Skinner, MD;  Location: [place];  Service: General;  Laterality: Right;    No family history on file.  Social History:  reports that she has been smoking Cigarettes.  She has been smoking about 0.50 packs per day. She does not have any smokeless tobacco history on file. She reports that she drinks alcohol. She reports that she does not use illicit drugs.  Allergies:  Allergies  Allergen Reactions  . Other     Per pt some type of mycin "some antibiotic sent me into renal failure."    Medications:  Prior to Admission:  Prescriptions prior to admission  Medication Sig Dispense Refill Last Dose  . albuterol (PROAIR HFA) 108 (90  Base) MCG/ACT inhaler Inhale 2 puffs into the lungs every 6 (six) hours as needed for wheezing or shortness of breath.   unknown  . aspirin EC 325 MG tablet Take 325 mg by mouth daily.     . clobetasol cream (TEMOVATE) 0.05 % Apply topically 2 (two) times daily. 30 g 0 Past Week at Unknown time  . FLUoxetine (PROZAC) 20 MG capsule Take 20 mg by mouth 2 (two) times daily.   1 10/12/2015 at Unknown time  . insulin aspart (NOVOLOG FLEXPEN) 100 UNIT/ML FlexPen Inject 3 Units into the skin 3 (three) times daily with meals.   10/12/2015 at Unknown time  . insulin glargine (LANTUS) 100 UNIT/ML injection Inject 0.18 mLs (18 Units total) into the skin 2 (two) times daily. (Patient taking differently: Inject 18 Units into the skin at bedtime. ) 10 mL 11 10/11/2015 at Unknown time  . metoprolol tartrate (LOPRESSOR) 25 MG tablet Take 1 tablet (25 mg total) by mouth 2 (two) times daily. 60 tablet 0 10/12/2015 at Unknown time  . pravastatin (PRAVACHOL) 40 MG tablet Take 40 mg by mouth at bedtime.   2 10/11/2015 at Unknown time  . diphenhydrAMINE (BENADRYL) 25 mg capsule Take 1 capsule (25 mg total) by mouth every 6 (six) hours as needed for itching. (Patient not taking: Reported on 10/12/2015) 30 capsule 0 09/27/2015 at Unknown time  . insulin aspart (NOVOLOG) 100 UNIT/ML injection Before each meal 3 times a day, 140-199 - 2 units, 200-250 - 4 units, 251-299 - 6 units,  300-349 -  8 units,  350 or above 10 units. (Patient not taking: Reported on 09/09/2015) 10 mL 11 Not Taking at Unknown time  . oxyCODONE (OXY IR/ROXICODONE) 5 MG immediate release tablet Take 1-2 tablets (5-10 mg total) by mouth every 4 (four) hours as needed for moderate pain. (Patient not taking: Reported on 09/09/2015) 30 tablet 0 Not Taking at Unknown time   Scheduled: . aspirin EC  325 mg Oral Daily  . fluconazole (DIFLUCAN) IV  200 mg Intravenous Q24H  . FLUoxetine  20 mg Oral BID  . heparin  5,000 Units Subcutaneous Q8H  . insulin aspart  0-9 Units  Subcutaneous Q4H  . insulin glargine  18 Units Subcutaneous QHS  . metoprolol tartrate  25 mg Oral BID  . piperacillin-tazobactam (ZOSYN)  IV  3.375 g Intravenous Q8H  . pravastatin  40 mg Oral QHS  . sodium chloride flush  3 mL Intravenous Q12H  . vancomycin  1,250 mg Intravenous Q24H    Results for orders placed or performed during the hospital encounter of 10/12/15 (from the past 48 hour(s))  CBC with Differential     Status: Abnormal   Collection Time: 10/12/15  3:30 PM  Result Value Ref Range   WBC 15.9 (H) 4.0 - 10.5 K/uL   RBC 2.67 (L) 3.87 - 5.11 MIL/uL   Hemoglobin 8.8 (L) 12.0 - 15.0 g/dL   HCT 24.8 (L) 36.0 - 46.0 %   MCV 92.9 78.0 - 100.0 fL   MCH 33.0 26.0 - 34.0 pg   MCHC 35.5 30.0 - 36.0 g/dL   RDW 15.1 11.5 - 15.5 %   Platelets 420 (H) 150 - 400 K/uL   Neutrophils Relative % 90 %   Lymphocytes Relative 4 %   Monocytes Relative 6 %   Eosinophils Relative 0 %   Basophils Relative 0 %   Neutro Abs 14.3 (H) 1.7 - 7.7 K/uL   Lymphs Abs 0.6 (L) 0.7 - 4.0 K/uL   Monocytes Absolute 1.0 0.1 - 1.0 K/uL   Eosinophils Absolute 0.0 0.0 - 0.7 K/uL   Basophils Absolute 0.0 0.0 - 0.1 K/uL   WBC Morphology WHITE COUNT CONFIRMED ON SMEAR     Comment: INCREASED BANDS (>20% BANDS) TOXIC GRANULATION DOHLE BODIES    Smear Review PLATELET COUNT CONFIRMED BY SMEAR     Comment: LARGE PLATELETS PRESENT  Basic metabolic panel     Status: Abnormal   Collection Time: 10/12/15  3:30 PM  Result Value Ref Range   Sodium 125 (L) 135 - 145 mmol/L   Potassium 4.2 3.5 - 5.1 mmol/L   Chloride 90 (L) 101 - 111 mmol/L   CO2 19 (L) 22 - 32 mmol/L   Glucose, Bld 557 (HH) 65 - 99 mg/dL    Comment: CRITICAL RESULT CALLED TO, READ BACK BY AND VERIFIED WITH: KNOWLES,R ON 10/12/15 AT 1710 BY LOY,C    BUN 82 (H) 6 - 20 mg/dL   Creatinine, Ser 2.75 (H) 0.44 - 1.00 mg/dL   Calcium 8.5 (L) 8.9 - 10.3 mg/dL   GFR calc non Af Amer 18 (L) >60 mL/min   GFR calc Af Amer 20 (L) >60 mL/min    Comment:  (NOTE) The eGFR has been calculated using the CKD EPI equation. This calculation has not been validated in all clinical situations. eGFR's persistently <60 mL/min signify possible Chronic Kidney Disease.    Anion gap 16 (H) 5 - 15  CBG monitoring, ED     Status: Abnormal   Collection  Time: 10/12/15  5:50 PM  Result Value Ref Range   Glucose-Capillary 383 (H) 65 - 99 mg/dL  Lactic acid, plasma     Status: Abnormal   Collection Time: 10/12/15  6:07 PM  Result Value Ref Range   Lactic Acid, Venous 3.0 (HH) 0.5 - 2.0 mmol/L    Comment: CRITICAL RESULT CALLED TO, READ BACK BY AND VERIFIED WITH: KNOWLES,R. AT 1858 ON 10/12/2015 BY BAUGHAM,M.   Culture, blood (routine x 2)     Status: None (Preliminary result)   Collection Time: 10/12/15  6:07 PM  Result Value Ref Range   Specimen Description BLOOD RIGHT ARM    Special Requests BOTTLES DRAWN AEROBIC AND ANAEROBIC 6CC    Culture PENDING    Report Status PENDING   Urinalysis, Routine w reflex microscopic (not at [facility])     Status: Abnormal   Collection Time: 10/12/15  6:20 PM  Result Value Ref Range   Color, Urine YELLOW YELLOW   APPearance CLEAR CLEAR   Specific Gravity, Urine 1.015 1.005 - 1.030   pH 5.5 5.0 - 8.0   Glucose, UA >1000 (A) NEGATIVE mg/dL   Hgb urine dipstick LARGE (A) NEGATIVE   Bilirubin Urine NEGATIVE NEGATIVE   Ketones, ur NEGATIVE NEGATIVE mg/dL   Protein, ur TRACE (A) NEGATIVE mg/dL   Nitrite NEGATIVE NEGATIVE   Leukocytes, UA MODERATE (A) NEGATIVE  Sodium, urine, random     Status: None   Collection Time: 10/12/15  6:20 PM  Result Value Ref Range   Sodium, Ur 12 mmol/L  Creatinine, urine, random     Status: None   Collection Time: 10/12/15  6:20 PM  Result Value Ref Range   Creatinine, Urine 66.97 mg/dL  Urine microscopic-add on     Status: Abnormal   Collection Time: 10/12/15  6:20 PM  Result Value Ref Range   Squamous Epithelial / LPF 0-5 (A) NONE SEEN   WBC, UA TOO NUMEROUS TO COUNT 0 - 5 WBC/hpf    RBC / HPF 6-30 0 - 5 RBC/hpf   Bacteria, UA MANY (A) NONE SEEN   Urine-Other YEAST PRESENT   Glucose, capillary     Status: Abnormal   Collection Time: 10/12/15  8:31 PM  Result Value Ref Range   Glucose-Capillary 339 (H) 65 - 99 mg/dL   Comment 1 Notify RN    Comment 2 Document in Chart   Lactic acid, plasma     Status: Abnormal   Collection Time: 10/12/15  8:33 PM  Result Value Ref Range   Lactic Acid, Venous 2.2 (HH) 0.5 - 2.0 mmol/L    Comment: CRITICAL RESULT CALLED TO, READ BACK BY AND VERIFIED WITH: BULLOCK T AT 2117 ON [id number] BY FORSYTH K   Culture, blood (routine x 2)     Status: None (Preliminary result)   Collection Time: 10/12/15 10:23 PM  Result Value Ref Range   Specimen Description BLOOD RIGHT HAND    Special Requests BOTTLES DRAWN AEROBIC AND ANAEROBIC 6CC    Culture PENDING    Report Status PENDING   Glucose, capillary     Status: Abnormal   Collection Time: 10/13/15 12:00 AM  Result Value Ref Range   Glucose-Capillary 380 (H) 65 - 99 mg/dL   Comment 1 Notify RN    Comment 2 Document in Chart   Glucose, capillary     Status: Abnormal   Collection Time: 10/13/15  4:51 AM  Result Value Ref Range   Glucose-Capillary 217 (H) 65 - 99 mg/dL  Comment 1 Notify RN    Comment 2 Document in Chart   CBC     Status: Abnormal   Collection Time: 10/13/15  6:17 AM  Result Value Ref Range   WBC 16.0 (H) 4.0 - 10.5 K/uL   RBC 2.90 (L) 3.87 - 5.11 MIL/uL   Hemoglobin 8.9 (L) 12.0 - 15.0 g/dL   HCT 26.4 (L) 36.0 - 46.0 %   MCV 91.0 78.0 - 100.0 fL   MCH 30.7 26.0 - 34.0 pg   MCHC 33.7 30.0 - 36.0 g/dL   RDW 15.4 11.5 - 15.5 %   Platelets 353 150 - 400 K/uL  Basic metabolic panel     Status: Abnormal   Collection Time: 10/13/15  6:17 AM  Result Value Ref Range   Sodium 132 (L) 135 - 145 mmol/L    Comment: DELTA CHECK NOTED   Potassium 3.5 3.5 - 5.1 mmol/L   Chloride 101 101 - 111 mmol/L   CO2 21 (L) 22 - 32 mmol/L   Glucose, Bld 183 (H) 65 - 99 mg/dL   BUN 76 (H)  6 - 20 mg/dL   Creatinine, Ser 2.12 (H) 0.44 - 1.00 mg/dL   Calcium 8.2 (L) 8.9 - 10.3 mg/dL   GFR calc non Af Amer 24 (L) >60 mL/min   GFR calc Af Amer 28 (L) >60 mL/min    Comment: (NOTE) The eGFR has been calculated using the CKD EPI equation. This calculation has not been validated in all clinical situations. eGFR's persistently <60 mL/min signify possible Chronic Kidney Disease.    Anion gap 10 5 - 15  Glucose, capillary     Status: Abnormal   Collection Time: 10/13/15  7:35 AM  Result Value Ref Range   Glucose-Capillary 132 (H) 65 - 99 mg/dL   Comment 1 Notify RN    Comment 2 Document in Chart     No results found.  ROS:  Pertinent items are noted in HPI.  Blood pressure 97/59, pulse 70, temperature 97.8 F (36.6 C), temperature source Oral, resp. rate 20, height 5' 2"  (1.575 m), weight 87.091 kg (192 lb), SpO2 100 %. Physical Exam: Pleasant black female in no acute distress. Alert and oriented. HEENT exam negative Neck supple without lymphadenopathy. Heart examination reveals a regular rate and rhythm without S3, S4, murmurs. Skin examination reveals a draining wound in the right axilla with purulent fluid present. Induration present in the region. Mild erythema is noted. Right inner thigh wound healing well by secondary intention. No purulence noted.   Assessment/Plan: Impression: Right axillary abscess, history of necrotizing fasciitis of right thigh, uncontrolled diabetes mellitus, renal insufficiency Plan: Keep wound in the right axilla clean and dry. Agree with vancomycin and Zosyn pending culture results. No need for surgical intervention as abscess is adequately draining. Will follow peripherally with you.  Camri Molloy A 10/13/2015, 9:25 AM

## 2015-10-13 NOTE — Evaluation (Signed)
Physical Therapy Evaluation Patient Details Name: [name] MRN: [id number] DOB: 1956-01-15 Today's Date: 10/13/2015   History of Present Illness  60 yo F admitted with sepsis due to abcess of R axilla - s/p I&D on 10/12/15, UTI with yeast.  PMH: HTN, DM, necrotizing fascitis R LE.  Clinical Impression  Pt received in bed, and marginally agreeable to PT evaluation today.  Pt expressed that she was really fatigued.  Pt states that she lives with several family members, and has 24/7 supervision at home.  She is independent with ADL's. And uses a RW for gait outside, but furniture walks in the house.  Today, pt required Mod A for supine<>sit transfer, and Min A for sit<>stand.  Pt only willing to take a few steps towards the [facility] and return back into the bed today.  Pt refused multiple times to sit up in the chair.  Pt recommended for continued HHPT upon d/c.     Follow Up Recommendations Home health PT;Supervision - Intermittent    Equipment Recommendations  None recommended by PT    Recommendations for Other Services       Precautions / Restrictions Precautions Precautions: Fall Precaution Comments: sacral wound Restrictions Weight Bearing Restrictions: No      Mobility  Bed Mobility Overal bed mobility: Needs Assistance Bed Mobility: Supine to Sit;Sit to Supine     Supine to sit: HOB elevated;Mod assist Sit to supine: Min assist   General bed mobility comments: Pt assisted with raising trunk due to inability to use R UE to pull up on the bed rails.  Prequired Min A for sit<>supine to lift LE's up into the bed.   Transfers Overall transfer level: Needs assistance   Transfers: Sit to/from Stand Sit to Stand: Min guard            Ambulation/Gait Ambulation/Gait assistance: Min guard Ambulation Distance (Feet): 2 Feet Assistive device: Rolling walker (2 wheeled) Gait Pattern/deviations: Step-to pattern     General Gait Details: Pt only willing to take a few  steps up towards the [facility].   Stairs            Wheelchair Mobility    Modified Rankin (Stroke Patients Only)       Balance           Standing balance support: No upper extremity supported                                 Pertinent Vitals/Pain Pain Assessment: 0-10 Pain Score: 3  Pain Location: R arm - "in hurts" Pain Intervention(s): Limited activity within patient's tolerance    Home Living   Living Arrangements: Other relatives ( brother, friend, and grandson) Available Help at Discharge: Home health;Family (home health nursing and PT) Type of Home: House Home Access: Stairs to enter Entrance Stairs-Rails: Can reach both Entrance Stairs-Number of Steps: 4 Home Layout: One level Home Equipment: Walker - 2 wheels;Cane - single point      Prior Function     Gait / Transfers Assistance Needed: Used a RW to ambulate in the community, but in the house she uses the wall  ADL's / Homemaking Assistance Needed: Pt states she was independent with ADL's "the best I could"        Hand Dominance        Extremity/Trunk Assessment   Upper Extremity Assessment: RUE deficits/detail RUE Deficits / Details: limited due to axilla abcess  and I&D.  RUE: Unable to fully assess due to pain       Lower Extremity Assessment: Generalized weakness         Communication   Communication: No difficulties  Cognition Arousal/Alertness: Awake/alert Behavior During Therapy: WFL for tasks assessed/performed Overall Cognitive Status: Within Functional Limits for tasks assessed                      General Comments General comments (skin integrity, edema, etc.): sacral wound covered with pink foam.     Exercises        Assessment/Plan    PT Assessment Patient needs continued PT services  PT Diagnosis Difficulty walking;Generalized weakness   PT Problem List Decreased strength;Decreased activity tolerance;Decreased balance;Decreased  mobility;Decreased coordination;Decreased safety awareness;Decreased knowledge of precautions;Obesity  PT Treatment Interventions Gait training;DME instruction;Stair training;Functional mobility training;Therapeutic activities;Therapeutic exercise;Balance training;Patient/family education   PT Goals (Current goals can be found in the Care Plan section) Acute Rehab PT Goals Patient Stated Goal: To go home PT Goal Formulation: With patient Time For Goal Achievement: 10/20/15 Potential to Achieve Goals: Good    Frequency Min 3X/week   Barriers to discharge        Co-evaluation               End of Session Equipment Utilized During Treatment: Gait belt Activity Tolerance: Patient tolerated treatment well Patient left: in bed;with call bell/phone within reach;with bed alarm set           Time: 1500-1520 PT Time Calculation (min) (ACUTE ONLY): 20 min   Charges:   PT Evaluation $PT Eval Moderate Complexity: 1 Procedure     PT G Codes:        Beth Kennon Encinas, PT, DPT X: [id number]   10/13/2015, 3:56 PM

## 2015-10-13 NOTE — Progress Notes (Signed)
Wound to R axilla cleansed with soap and water per orders - dry dressing applied.  Moderate amount of yellow drainage.  Dressing also changed to R thigh - wet to dry.

## 2015-10-13 NOTE — Progress Notes (Addendum)
Hypoglycemic Event  CBG: 48  Treatment: 15 GM carbohydrate snack  Symptoms: None  Follow-up CBG: Time:2135 CBG Result: 65  Treatment: 15 GM carbohydrate snack  Symptoms: None  Follow-up CBG Time: 2200   CBG Result: 88  Possible Reasons for Event: Inadequate meal intake  Comments/MD notified:TCallahan    Carola FrostHandy, Dorie Rankandra Cochran

## 2015-10-13 NOTE — Progress Notes (Signed)
ANTIBIOTIC CONSULT NOTE- follow up  Pharmacy Consult for VANCOMYCIN Indication: wound abscess  Allergies  Allergen Reactions  . Other     Per pt some type of mycin "some antibiotic sent me into renal failure."   Patient Measurements: Height: 5\' 2"  (157.5 cm) Weight: 192 lb (87.091 kg) IBW/kg (Calculated) : 50.1  Vital Signs: Temp: 97.8 F (36.6 C) (05/05 0513) Temp Source: Oral (05/05 0513) BP: 97/59 mmHg (05/05 0513) Pulse Rate: 70 (05/05 0513)  Labs:  Recent Labs  10/12/15 1530 10/12/15 1820 10/13/15 0617  WBC 15.9*  --  16.0*  HGB 8.8*  --  8.9*  PLT 420*  --  353  LABCREA  --  66.97  --   CREATININE 2.75*  --  2.12*   Estimated Creatinine Clearance: 28.9 mL/min (by C-G formula based on Cr of 2.12).  No results for input(s): VANCOTROUGH, VANCOPEAK, VANCORANDOM, GENTTROUGH, GENTPEAK, GENTRANDOM, TOBRATROUGH, TOBRAPEAK, TOBRARND, AMIKACINPEAK, AMIKACINTROU, AMIKACIN in the last 72 hours.   Microbiology: Recent Results (from the past 720 hour(s))  Culture, blood (routine x 2)     Status: None (Preliminary result)   Collection Time: 10/12/15  6:07 PM  Result Value Ref Range Status   Specimen Description BLOOD RIGHT ARM  Final   Special Requests BOTTLES DRAWN AEROBIC AND ANAEROBIC 6CC  Final   Culture PENDING  Incomplete   Report Status PENDING  Incomplete  Culture, blood (routine x 2)     Status: None (Preliminary result)   Collection Time: 10/12/15 10:23 PM  Result Value Ref Range Status   Specimen Description BLOOD RIGHT HAND  Final   Special Requests BOTTLES DRAWN AEROBIC AND ANAEROBIC 6CC  Final   Culture PENDING  Incomplete   Report Status PENDING  Incomplete   Medical History: Past Medical History  Diagnosis Date  . Diabetes mellitus without complication (HCC)   . Hypertension    Assessment: Brought by EMS to ED w/ c/o abscess under right arm, draining with foul odor.  Asked to initiate Vancomycin.  SCr elevated, Estimated Creatinine Clearance: 28.9  mL/min (by C-G formula based on Cr of 2.12).  Plan:  Vancomycin 1250mg  IV q24h Check trough at steady state Zosyn 3.375gm IV q8h, EID Monitor labs, renal fxn, progress and c/s Deescalate ABX when improved / appropriate.      Valrie HartHall, Mart Colpitts A, RPH 10/13/2015,8:37 AM

## 2015-10-13 NOTE — Progress Notes (Signed)

## 2015-10-13 NOTE — Consult Note (Signed)
WOC wound consult note Reason for Consult: Abscess with I & D to right axilla.  Surgical debridement of necrotizing fasciitis to right upper thigh, near inguinal fold.  This area is clean and is receiving BID NS moist gauze dressings.  Wound type:Infectious  Right axilla Pressure Ulcer POA: N/A Measurement: Right axilla indurated raised area 12 cm x 8.2 cm, recently lanced and draining purulent, foul smelling effluent Wound bed: Lanced, raised epithelium  Dark discoloration in axilla region Drainage (amount, consistency, odor) Moderate purulent foul smelling effluent Periwound:indurated Dressing procedure/placement/frequency:Cleanse right axilla with NS and pat gently dry.  Apply 2 sheets of Aquacel Ag to raised area.  Cover with ABD pad and tape.  Change daily.   Cleanse right thigh wound with NS and pat gently dry.  Apply NS moist gauze to wound bed. Cover with Abd pad and tape. Change twice daily.  Will not follow at this time.  Please re-consult if needed.  Maple HudsonKaren Lavanda Nevels RN BSN CWON Pager [phone]

## 2015-10-13 NOTE — Progress Notes (Signed)
PROGRESS NOTE    [name]  ZOX:[id number] DOB: Oct 20, 1955 DOA: 10/12/2015 PCP: [name], MD  Outpatient Specialists:    Brief Narrative:  57 yof with a hx of DM type 2 and HTN presented with compalints of a boil on her right axial that she first noted about a week ago. About 4 days PTA, it began draining foul-smelling purulent fluid. She has had similar issues with a left thigh wound which she's had home health services that change her dressings every other day. She also has diabetes that is not well controlled. She underwent I&D of the right axilla and was placed on Vanc. She was also found to have a UTI and was started on Zosyn.   Assessment & Plan:   Principal Problem:   Abscess and cellulitis Active Problems:   IDDM (insulin dependent diabetes mellitus) (HCC)   Normocytic anemia   UTI (urinary tract infection)   HLD (hyperlipidemia)   Anemia of chronic disease   Depression  1. Abscess with cellulitis of right axilla and sepsis. Patient initially found to have abscess of right axilla. WBC and lactic acid elevation. Soft BP. Wound purulent and producing foul smelling drainage. I&D was performed 5/04 in ER. Follow lactic acid. Follow cultures. Continue abx. BP is stable.  2. UTI. UA indicative of infection. Likely susceptible to infections due to poorly controlled DM2. Continue abx and diflucan. Follow cx. 3. DM type 2. Hyperglycemic with glucose 557. Last A1c in 09/2015 elevated at 9.7. Give IVFs. Pt would benefit from diabetic education. Continue SSI. Blood sugars are improving. 4. CKD stage 3-4. Cr 2.1. Continue with fluids and monitor. She has an appointment with Dr. Kathrene Bongo next week. 5. Essential HTN. Stable. 6. Anemia of suspected chronic disease. Hgb near baseline. Continue to monitor. 7. Thrombocytosis. Likely reactive secondary to the above. Continue to monitor. 8. Depression. Continue prozac.  9. HLD. Continue statins.   DVT prophylaxis:  Heparin Code Status: Full Family Communication: Discussed with patient. No family present at bedside. Disposition Plan: discharge home in 1-2 days.   Consultants:   General surgery  Wound care  Procedures:  I&D 5/04  Antimicrobials:   Vanc 5/04 >>   Zosyn 5/04 >>  Subjective: Feels good today. Wound has been draining well. She has been eating and drinking well at home.   Objective: Filed Vitals:   10/12/15 1836 10/12/15 1935 10/12/15 2042 10/13/15 0513  BP: 103/64 106/69 91/44 97/59   Pulse: 87 81 91 70  Temp:  98.3 F (36.8 C) 98.1 F (36.7 C) 97.8 F (36.6 C)  TempSrc:  Oral Oral Oral  Resp: 20 20 20 20   Height:      Weight:   87.091 kg (192 lb)   SpO2: 98% 100% 97% 100%    Intake/Output Summary (Last 24 hours) at 10/13/15 0808 Last data filed at 10/12/15 2007  Gross per 24 hour  Intake   1000 ml  Output      0 ml  Net   1000 ml   Filed Weights   10/12/15 1433 10/12/15 2042  Weight: 95.255 kg (210 lb) 87.091 kg (192 lb)    Examination: General exam: Appears calm and comfortable  Respiratory system: Clear to auscultation. Respiratory effort normal. Cardiovascular system: S1 & S2 heard, RRR. No JVD, murmurs, rubs, gallops or clicks. No pedal edema. Gastrointestinal system: Abdomen is nondistended, soft and nontender. No organomegaly or masses felt. Normal bowel sounds heard. Central nervous system: Alert and oriented. No focal neurological deficits. Extremities:  Symmetric 5 x 5 power. Skin: Draining wound of right axilla with purulent fluid. Psychiatry: Judgement and insight appear normal. Mood & affect appropriate.     Data Reviewed: I have personally reviewed following labs and imaging studies  CBC:  Recent Labs Lab 10/12/15 1530 10/13/15 0617  WBC 15.9* 16.0*  NEUTROABS 14.3*  --   HGB 8.8* 8.9*  HCT 24.8* 26.4*  MCV 92.9 91.0  PLT 420* 353   Basic Metabolic Panel:  Recent Labs Lab 10/12/15 1530  NA 125*  K 4.2  CL 90*  CO2 19*   GLUCOSE 557*  BUN 82*  CREATININE 2.75*  CALCIUM 8.5*   GFR: Estimated Creatinine Clearance: 22.3 mL/min (by C-G formula based on Cr of 2.75). Liver Function Tests: No results for input(s): AST, ALT, ALKPHOS, BILITOT, PROT, ALBUMIN in the last 168 hours. No results for input(s): LIPASE, AMYLASE in the last 168 hours. No results for input(s): AMMONIA in the last 168 hours. Coagulation Profile: No results for input(s): INR, PROTIME in the last 168 hours. Cardiac Enzymes: No results for input(s): CKTOTAL, CKMB, CKMBINDEX, TROPONINI in the last 168 hours. BNP (last 3 results) No results for input(s): PROBNP in the last 8760 hours. HbA1C: No results for input(s): HGBA1C in the last 72 hours. CBG:  Recent Labs Lab 10/12/15 1750 10/12/15 2031 10/13/15 10/13/15 0451 10/13/15 0735  GLUCAP 383* 339* 380* 217* 132*   Lipid Profile: No results for input(s): CHOL, HDL, LDLCALC, TRIG, CHOLHDL, LDLDIRECT in the last 72 hours. Thyroid Function Tests: No results for input(s): TSH, T4TOTAL, FREET4, T3FREE, THYROIDAB in the last 72 hours. Anemia Panel: No results for input(s): VITAMINB12, FOLATE, FERRITIN, TIBC, IRON, RETICCTPCT in the last 72 hours. Urine analysis:    Component Value Date/Time   COLORURINE YELLOW 10/12/2015 1820   APPEARANCEUR CLEAR 10/12/2015 1820   LABSPEC 1.015 10/12/2015 1820   PHURINE 5.5 10/12/2015 1820   GLUCOSEU >1000* 10/12/2015 1820   HGBUR LARGE* 10/12/2015 1820   BILIRUBINUR NEGATIVE 10/12/2015 1820   KETONESUR NEGATIVE 10/12/2015 1820   PROTEINUR TRACE* 10/12/2015 1820   NITRITE NEGATIVE 10/12/2015 1820   LEUKOCYTESUR MODERATE* 10/12/2015 1820   Sepsis Labs: @LABRCNTIP (procalcitonin:4,lacticidven:4)  ) Recent Results (from the past 240 hour(s))  Culture, blood (routine x 2)     Status: None (Preliminary result)   Collection Time: 10/12/15  6:07 PM  Result Value Ref Range Status   Specimen Description BLOOD RIGHT ARM  Final   Special Requests  BOTTLES DRAWN AEROBIC AND ANAEROBIC 6CC  Final   Culture PENDING  Incomplete   Report Status PENDING  Incomplete  Culture, blood (routine x 2)     Status: None (Preliminary result)   Collection Time: 10/12/15 10:23 PM  Result Value Ref Range Status   Specimen Description BLOOD RIGHT HAND  Final   Special Requests BOTTLES DRAWN AEROBIC AND ANAEROBIC 6CC  Final   Culture PENDING  Incomplete   Report Status PENDING  Incomplete     Radiology Studies: No results found.   Scheduled Meds: . aspirin EC  325 mg Oral Daily  . fluconazole (DIFLUCAN) IV  200 mg Intravenous Q24H  . FLUoxetine  20 mg Oral BID  . heparin  5,000 Units Subcutaneous Q8H  . insulin aspart  0-9 Units Subcutaneous Q4H  . insulin glargine  18 Units Subcutaneous QHS  . metoprolol tartrate  25 mg Oral BID  . piperacillin-tazobactam (ZOSYN)  IV  3.375 g Intravenous Q8H  . pravastatin  40 mg Oral QHS  .  sodium chloride flush  3 mL Intravenous Q12H   Continuous Infusions:    LOS: 1 day    Time spent: 25 minutes   Erick Blinks, MD [facility] Pager [phone]  If 7PM-7AM, please contact night-coverage [email or web address] Password [email or web address] 10/13/2015, 8:08 AM    By signing my name below, I, Adron Bene, attest that this documentation has been prepared under the direction and in the presence of Erick Blinks, MD. Electronically Signed: Adron Bene 10/13/2015 10:25am  I, Dr. Erick Blinks, personally performed the services described in this documentaiton. All medical record entries made by the scribe were at my direction and in my presence. I have reviewed the chart and agree that the record reflects my personal performance and is accurate and complete  Erick Blinks, MD, 10/13/2015 11:52 AM

## 2015-10-14 DIAGNOSIS — A4150 Gram-negative sepsis, unspecified: Principal | ICD-10-CM

## 2015-10-14 LAB — GLUCOSE, CAPILLARY
GLUCOSE-CAPILLARY: 141 mg/dL — AB (ref 65–99)
GLUCOSE-CAPILLARY: 160 mg/dL — AB (ref 65–99)
GLUCOSE-CAPILLARY: 230 mg/dL — AB (ref 65–99)
GLUCOSE-CAPILLARY: 247 mg/dL — AB (ref 65–99)
Glucose-Capillary: 213 mg/dL — ABNORMAL HIGH (ref 65–99)

## 2015-10-14 LAB — CBC
HCT: 23.9 % — ABNORMAL LOW (ref 36.0–46.0)
HEMOGLOBIN: 8.3 g/dL — AB (ref 12.0–15.0)
MCH: 32.4 pg (ref 26.0–34.0)
MCHC: 34.7 g/dL (ref 30.0–36.0)
MCV: 93.4 fL (ref 78.0–100.0)
PLATELETS: 397 10*3/uL (ref 150–400)
RBC: 2.56 MIL/uL — ABNORMAL LOW (ref 3.87–5.11)
RDW: 15.5 % (ref 11.5–15.5)
WBC: 15.7 10*3/uL — ABNORMAL HIGH (ref 4.0–10.5)

## 2015-10-14 LAB — BASIC METABOLIC PANEL
Anion gap: 12 (ref 5–15)
BUN: 81 mg/dL — AB (ref 6–20)
CHLORIDE: 101 mmol/L (ref 101–111)
CO2: 19 mmol/L — ABNORMAL LOW (ref 22–32)
CREATININE: 1.76 mg/dL — AB (ref 0.44–1.00)
Calcium: 8.1 mg/dL — ABNORMAL LOW (ref 8.9–10.3)
GFR calc Af Amer: 35 mL/min — ABNORMAL LOW (ref >60)
GFR calc non Af Amer: 30 mL/min — ABNORMAL LOW (ref >60)
GLUCOSE: 246 mg/dL — AB (ref 65–99)
Potassium: 3.6 mmol/L (ref 3.5–5.1)
SODIUM: 132 mmol/L — AB (ref 135–145)

## 2015-10-14 LAB — CULTURE, ROUTINE-ABSCESS

## 2015-10-14 NOTE — Progress Notes (Signed)
PROGRESS NOTE    [name]  ZOX:[id number] DOB: 1956-02-15 DOA: 10/12/2015 PCP: [name], MD  Outpatient Specialists:    Brief Narrative:  46 yof with a hx of DM type 2 and HTN presented with compalints of a boil on her right axial that she first noted about a week ago. About 4 days PTA, it began draining foul-smelling purulent fluid. She has had similar issues with a left thigh wound which she's had home health services that change her dressings every other day. She also has diabetes that is not well controlled. She underwent I&D of the right axilla and was placed on Vanc. She was also found to have a UTI and was started on Zosyn.   Assessment & Plan:   Principal Problem:   Abscess and cellulitis Active Problems:   IDDM (insulin dependent diabetes mellitus) (HCC)   Normocytic anemia   UTI (urinary tract infection)   HLD (hyperlipidemia)   Anemia of chronic disease   Depression  1. Abscess with cellulitis of right axilla and sepsis. Patient initially found to have abscess of right axilla. WBC and lactic acid elevation. Lactic acid trending down. Soft BP. Wound purulent and producing foul smelling drainage. I&D was performed 5/04 in ER. Wound culture showed group B strep. Will discontinue vancomycin. BP is stable.  2. UTI. Urine culture shows gram negative rods. Continue zosyn 3. Gram negative bacteremia. Blood cultures 1/2 positive for GNR. Continue zosyn until further sensitivities available. 4. DM type 2. Glucose 246. Last A1c in 09/2015 elevated at 9.7. Give IVFs. Pt would benefit from diabetic education. Continue SSI. Blood sugars are improving. 5. CKD stage 3-4. Cr 1.76. Continue with fluids and monitor. She has an appointment with Dr. Kathrene Bongo next week. 6. Essential HTN. Stable. 7. Anemia of suspected chronic disease. Hgb near baseline. Continue to monitor. 8. Thrombocytosis. Likely reactive secondary to the above. Continue to monitor. 9. Depression.  Continue prozac.  10. HLD. Continue statins.   DVT prophylaxis: Heparin Code Status: Full Family Communication: Discussed with patient. No family present at bedside. Disposition Plan: discharge home in 1-2 days.   Consultants:   General surgery  Wound care  Procedures:  I&D 5/04 in ED  Antimicrobials:   Vanc 5/04 >> 5/6  Zosyn 5/04 >>  Subjective: No new complaints. Right axilla is sore  Objective: Filed Vitals:   10/13/15 1056 10/13/15 1300 10/13/15 2110 10/14/15 0553  BP: 105/54 103/61 105/64 102/60  Pulse: 75 82 80 78  Temp:  97.9 F (36.6 C) 97.5 F (36.4 C) 97.6 F (36.4 C)  TempSrc:  Oral Oral Oral  Resp:  Height:      Weight:      SpO2:  99% 99% 99%    Intake/Output Summary (Last 24 hours) at 10/14/15 0810 Last data filed at 10/14/15 0554  Gross per 24 hour  Intake    100 ml  Output    800 ml  Net   -700 ml   Filed Weights   10/12/15 1433 10/12/15 2042  Weight: 95.255 kg (210 lb) 87.091 kg (192 lb)    Examination: General exam: Appears calm and comfortable  Respiratory system: Clear to auscultation. Respiratory effort normal. Cardiovascular system: S1 & S2 heard, RRR. No JVD, murmurs, rubs, gallops or clicks. No pedal edema. Gastrointestinal system: Abdomen is nondistended, soft and nontender. No organomegaly or masses felt. Normal bowel sounds heard. Central nervous system: Alert and oriented. No focal neurological deficits. Extremities: Symmetric 5 x 5  power. Skin: Draining wound of right axilla with purulent fluid. foul smelling Psychiatry: Judgement and insight appear normal. Mood & affect appropriate.     Data Reviewed: I have personally reviewed following labs and imaging studies  CBC:  Recent Labs Lab 10/12/15 1530 10/13/15 0617 10/14/15 0616  WBC 15.9* 16.0* 15.7*  NEUTROABS 14.3*  --   --   HGB 8.8* 8.9* 8.3*  HCT 24.8* 26.4* 23.9*  MCV 92.9 91.0 93.4  PLT 420* 353 397   Basic Metabolic Panel:  Recent  Labs Lab 10/12/15 1530 10/13/15 0617 10/14/15 0616  NA 125* 132* 132*  K 4.2 3.5 3.6  CL 90* 101 101  CO2 19* 21* 19*  GLUCOSE 557* 183* 246*  BUN 82* 76* 81*  CREATININE 2.75* 2.12* 1.76*  CALCIUM 8.5* 8.2* 8.1*   GFR: Estimated Creatinine Clearance: 34.8 mL/min (by C-G formula based on Cr of 1.76). Liver Function Tests: No results for input(s): AST, ALT, ALKPHOS, BILITOT, PROT, ALBUMIN in the last 168 hours. No results for input(s): LIPASE, AMYLASE in the last 168 hours. No results for input(s): AMMONIA in the last 168 hours. Coagulation Profile: No results for input(s): INR, PROTIME in the last 168 hours. Cardiac Enzymes: No results for input(s): CKTOTAL, CKMB, CKMBINDEX, TROPONINI in the last 168 hours. BNP (last 3 results) No results for input(s): PROBNP in the last 8760 hours. HbA1C: No results for input(s): HGBA1C in the last 72 hours. CBG:  Recent Labs Lab 10/13/15 2149 10/13/15 2232 10/14/15 0016 10/14/15 0345 10/14/15 0751  GLUCAP 65 88 141* 213* 230*   Lipid Profile: No results for input(s): CHOL, HDL, LDLCALC, TRIG, CHOLHDL, LDLDIRECT in the last 72 hours. Thyroid Function Tests: No results for input(s): TSH, T4TOTAL, FREET4, T3FREE, THYROIDAB in the last 72 hours. Anemia Panel: No results for input(s): VITAMINB12, FOLATE, FERRITIN, TIBC, IRON, RETICCTPCT in the last 72 hours. Urine analysis:    Component Value Date/Time   COLORURINE YELLOW 10/12/2015 1820   APPEARANCEUR CLEAR 10/12/2015 1820   LABSPEC 1.015 10/12/2015 1820   PHURINE 5.5 10/12/2015 1820   GLUCOSEU >1000* 10/12/2015 1820   HGBUR LARGE* 10/12/2015 1820   BILIRUBINUR NEGATIVE 10/12/2015 1820   KETONESUR NEGATIVE 10/12/2015 1820   PROTEINUR TRACE* 10/12/2015 1820   NITRITE NEGATIVE 10/12/2015 1820   LEUKOCYTESUR MODERATE* 10/12/2015 1820   Sepsis Labs: @LABRCNTIP (procalcitonin:4,lacticidven:4)  ) Recent Results (from the past 240 hour(s))  Culture, routine-abscess     Status:  None (Preliminary result)   Collection Time: 10/12/15  4:30 PM  Result Value Ref Range Status   Specimen Description ABSCESS RIGHT AXILLA  Final   Special Requests IMMUNE:COMPROMISED  Final   Gram Stain   Final    RARE WBC PRESENT, PREDOMINANTLY MONONUCLEAR NO SQUAMOUS EPITHELIAL CELLS SEEN ABUNDANT GRAM POSITIVE COCCI IN PAIRS IN CLUSTERS FEW GRAM NEGATIVE RODS [facility]    Culture   Final    Culture reincubated for better growth Performed at [facility]    Report Status PENDING  Incomplete  Culture, blood (routine x 2)     Status: None (Preliminary result)   Collection Time: 10/12/15  6:07 PM  Result Value Ref Range Status   Specimen Description BLOOD RIGHT ARM  Final   Special Requests BOTTLES DRAWN AEROBIC AND ANAEROBIC 6CC  Final   Culture NO GROWTH 2 DAYS  Final   Report Status PENDING  Incomplete  Culture, blood (routine x 2)     Status: None (Preliminary result)   Collection Time:  10/12/15 10:23 PM  Result Value Ref Range Status   Specimen Description BLOOD RIGHT HAND  Final   Special Requests BOTTLES DRAWN AEROBIC AND ANAEROBIC 6CC  Final   Culture NO GROWTH 2 DAYS  Final   Report Status PENDING  Incomplete     Radiology Studies: No results found.   Scheduled Meds: . aspirin EC  325 mg Oral Daily  . fluconazole (DIFLUCAN) IV  200 mg Intravenous Q24H  . FLUoxetine  20 mg Oral BID  . heparin  5,000 Units Subcutaneous Q8H  . insulin aspart  0-9 Units Subcutaneous Q4H  . insulin glargine  18 Units Subcutaneous QHS  . metoprolol tartrate  25 mg Oral BID  . piperacillin-tazobactam (ZOSYN)  IV  3.375 g Intravenous Q8H  . pravastatin  40 mg Oral QHS  . sodium chloride flush  3 mL Intravenous Q12H  . vancomycin  1,250 mg Intravenous Q24H   Continuous Infusions:    LOS: 2 days    Time spent: 25 minutes   Erick Blinks, MD [facility] Pager [phone]  If 7PM-7AM, please contact  night-coverage [email or web address] Password [email or web address] 10/14/2015, 8:10 AM

## 2015-10-14 NOTE — Progress Notes (Signed)
Will hold 2200 dose of Lantus per T.Callahan's instruction.  Patient currently has a blood sugar of 48.

## 2015-10-14 NOTE — Progress Notes (Signed)
[facility].  Currently they do not have any updated information to start services.  Pending d/c will follow up to see if patient will have home health order.

## 2015-10-14 NOTE — Progress Notes (Signed)
Foley DC'd.  Patient DTV by 0200.  Instructed patient to notify staff using call bell when needing to use BR.  [facility].  Patient verbalized understanding.

## 2015-10-14 NOTE — Progress Notes (Signed)
CM spoke with patient regarding HH services.  Per patient she has [facility] company that does her services.   Patient also has DME equipment that was provided to her from them.  She will need her BSC exchanged for a higher standing type of BSC.   Family able to assist with doing wound care but not with any IV antibiotics.  CM will follow up with [facility] for services to continue. Spoke with doctor regarding d/c plan.  [facility] and [facility] to be given orally.

## 2015-10-14 NOTE — Progress Notes (Signed)
Patients BC with gram negative rods.  Dr. Kerry HoughMemon paged and made aware - no new orders at this time.

## 2015-10-15 LAB — GLUCOSE, CAPILLARY
GLUCOSE-CAPILLARY: 102 mg/dL — AB (ref 65–99)
GLUCOSE-CAPILLARY: 186 mg/dL — AB (ref 65–99)
GLUCOSE-CAPILLARY: 209 mg/dL — AB (ref 65–99)
GLUCOSE-CAPILLARY: 85 mg/dL (ref 65–99)
Glucose-Capillary: 79 mg/dL (ref 65–99)
Glucose-Capillary: 174 mg/dL — ABNORMAL HIGH (ref 65–99)

## 2015-10-15 LAB — BLOOD CULTURE ID PANEL (REFLEXED)
Acinetobacter baumannii: NOT DETECTED
CANDIDA ALBICANS: NOT DETECTED
CANDIDA PARAPSILOSIS: NOT DETECTED
CARBAPENEM RESISTANCE: NOT DETECTED
Candida glabrata: NOT DETECTED
Candida krusei: NOT DETECTED
Candida tropicalis: NOT DETECTED
ENTEROCOCCUS SPECIES: NOT DETECTED
Enterobacter cloacae complex: NOT DETECTED
Enterobacteriaceae species: NOT DETECTED
Escherichia coli: NOT DETECTED
HAEMOPHILUS INFLUENZAE: NOT DETECTED
KLEBSIELLA PNEUMONIAE: NOT DETECTED
Klebsiella oxytoca: NOT DETECTED
Listeria monocytogenes: NOT DETECTED
Methicillin resistance: NOT DETECTED
Neisseria meningitidis: NOT DETECTED
PROTEUS SPECIES: NOT DETECTED
Pseudomonas aeruginosa: NOT DETECTED
SERRATIA MARCESCENS: NOT DETECTED
STAPHYLOCOCCUS SPECIES: NOT DETECTED
STREPTOCOCCUS AGALACTIAE: NOT DETECTED
STREPTOCOCCUS PNEUMONIAE: NOT DETECTED
STREPTOCOCCUS PYOGENES: NOT DETECTED
STREPTOCOCCUS SPECIES: NOT DETECTED
Staphylococcus aureus (BCID): NOT DETECTED
VANCOMYCIN RESISTANCE: NOT DETECTED

## 2015-10-15 LAB — BASIC METABOLIC PANEL
ANION GAP: 11 (ref 5–15)
BUN: 79 mg/dL — ABNORMAL HIGH (ref 6–20)
CALCIUM: 8.4 mg/dL — AB (ref 8.9–10.3)
CO2: 22 mmol/L (ref 22–32)
Chloride: 105 mmol/L (ref 101–111)
Creatinine, Ser: 1.58 mg/dL — ABNORMAL HIGH (ref 0.44–1.00)
GFR, EST AFRICAN AMERICAN: 40 mL/min — AB (ref >60)
GFR, EST NON AFRICAN AMERICAN: 35 mL/min — AB (ref >60)
GLUCOSE: 99 mg/dL (ref 65–99)
Potassium: 3.9 mmol/L (ref 3.5–5.1)
Sodium: 138 mmol/L (ref 135–145)

## 2015-10-15 LAB — URINE CULTURE: Culture: >=100000 — AB

## 2015-10-15 LAB — CBC
HCT: 26.2 % — ABNORMAL LOW (ref 36.0–46.0)
HEMOGLOBIN: 8.6 g/dL — AB (ref 12.0–15.0)
MCH: 30.3 pg (ref 26.0–34.0)
MCHC: 32.8 g/dL (ref 30.0–36.0)
MCV: 92.3 fL (ref 78.0–100.0)
PLATELETS: 409 10*3/uL — AB (ref 150–400)
RBC: 2.84 MIL/uL — ABNORMAL LOW (ref 3.87–5.11)
RDW: 15.7 % — ABNORMAL HIGH (ref 11.5–15.5)
WBC: 16.8 10*3/uL — ABNORMAL HIGH (ref 4.0–10.5)

## 2015-10-15 NOTE — Progress Notes (Signed)
CM contacted [facility] [place] [phone] patient currently receives for RN and PT.  Will have information faxed once patient is ready for discharge.  [facility] her home.  Customer service informed this item is covered by medicaid.  She will have to pay for the equipment.  DME/elevated BSC will be $30.00 and item will be billed.  CM informed patient regarding cost of item and was agreeable for wanting the item.    Contacted [facility] regarding the DME order and someone from company will follow up to make sure she has equipment.

## 2015-10-15 NOTE — Progress Notes (Signed)
PROGRESS NOTE    [name]  ZOX:[id number] DOB: 08-23-1955 DOA: 10/12/2015 PCP: [name], MD  Outpatient Specialists:    Brief Narrative:  69 yof with a hx of DM type 2 and HTN presented with compalints of a boil on her right axial that she first noted about a week ago. About 4 days PTA, it began draining foul-smelling purulent fluid. She has had similar issues with a left thigh wound which she's had home health services that change her dressings every other day. She also has diabetes that is not well controlled. She underwent I&D of the right axilla and was placed on Vanc. She was also found to have a UTI and was started on Zosyn.   Assessment & Plan:   Principal Problem:   Abscess and cellulitis Active Problems:   IDDM (insulin dependent diabetes mellitus) (HCC)   Normocytic anemia   UTI (urinary tract infection)   HLD (hyperlipidemia)   Anemia of chronic disease   Depression   Gram negative sepsis (HCC)  1. Abscess with cellulitis of right axilla and sepsis. Patient initially found to have abscess of right axilla. WBC and lactic acid elevated on admission. Lactic acid trending down. Soft BP. Wound purulent and producing foul smelling drainage. I&D was performed 5/04 in ER. Wound culture showed group B strep. Vancomycin has been discontinued but she is continued on Zosyn.  2. UTI. Urine culture shows gram negative rods. She is afebrile.  Continue zosyn 3. Gram negative bacteremia. Blood cultures 1/2 positive for GNR. Continue zosyn until further sensitivities available. 4. DM type 2. Glucose 99. Last A1c in 09/2015 elevated at 9.7. Pt would benefit from diabetic education. Continue SSI. Blood sugars are stable.  5. CKD stage 3-4. Cr 1.58. Continue with fluids and monitor. She has an appointment with Dr. Kathrene Bongo next week. 6. Essential HTN. Stable. 7. Anemia of suspected chronic disease. Hgb near baseline. Continue to monitor. 8. Thrombocytosis. Likely reactive  secondary to the above. Continue to monitor. 9. Depression. Continue prozac.  10. HLD. Continue statins.   DVT prophylaxis: Heparin Code Status: Full Family Communication: Discussed with patient. No family present at bedside. Disposition Plan: discharge home in 1-2 days.   Consultants:   General surgery  Wound care  Procedures:  I&D 5/04 in ED  Antimicrobials:   Vanc 5/04 >> 5/6  Zosyn 5/04 >>  Subjective: Feeling much better. Still has some soreness and drainage to her right axilla. Denies any SOB and has been ambulating.   Objective: Filed Vitals:   10/14/15 0553 10/14/15 1524 10/14/15 2123 10/15/15 0550  BP: 102/60 99/55 94/56  90/59  Pulse: 78 72 80 72  Temp: 97.6 F (36.4 C) 98 F (36.7 C) 97.9 F (36.6 C) 97.5 F (36.4 C)  TempSrc: Oral Oral Oral Oral  Resp: Height:      Weight:      SpO2: 99% 100% 100% 100%    Intake/Output Summary (Last 24 hours) at 10/15/15 0819 Last data filed at 10/14/15 1742  Gross per 24 hour  Intake    240 ml  Output    400 ml  Net   -160 ml   Filed Weights   10/12/15 1433 10/12/15 2042  Weight: 95.255 kg (210 lb) 87.091 kg (192 lb)    Examination: General exam: Appears calm and comfortable  Respiratory system: Clear to auscultation. Respiratory effort normal. Cardiovascular system: S1 & S2 heard, RRR. No JVD, murmurs, rubs, gallops or clicks. No pedal edema.  Gastrointestinal system: Abdomen is nondistended, soft and nontender. No organomegaly or masses felt. Normal bowel sounds heard. Central nervous system: Alert and oriented. No focal neurological deficits. Extremities: Symmetric 5 x 5 power. Skin: Draining wound of right axilla with purulent fluid. foul smelling Psychiatry: Judgement and insight appear normal. Mood & affect appropriate.     Data Reviewed: I have personally reviewed following labs and imaging studies  CBC:  Recent Labs Lab 10/12/15 1530 10/13/15 0617 10/14/15 0616  10/15/15 0624  WBC 15.9* 16.0* 15.7* 16.8*  NEUTROABS 14.3*  --   --   --   HGB 8.8* 8.9* 8.3* 8.6*  HCT 24.8* 26.4* 23.9* 26.2*  MCV 92.9 91.0 93.4 92.3  PLT 420* 353 397 409*   Basic Metabolic Panel:  Recent Labs Lab 10/12/15 1530 10/13/15 0617 10/14/15 0616 10/15/15 0624  NA 125* 132* 132* 138  K 4.2 3.5 3.6 3.9  CL 90* 101 101 105  CO2 19* 21* 19* 22  GLUCOSE 557* 183* 246* 99  BUN 82* 76* 81* 79*  CREATININE 2.75* 2.12* 1.76* 1.58*  CALCIUM 8.5* 8.2* 8.1* 8.4*   CBG:  Recent Labs Lab 10/14/15 1122 10/14/15 2116 10/15/15 0023 10/15/15 0415 10/15/15 0723  GLUCAP 247* 160* 102* 85 79   Urine analysis:    Component Value Date/Time   COLORURINE YELLOW 10/12/2015 1820   APPEARANCEUR CLEAR 10/12/2015 1820   LABSPEC 1.015 10/12/2015 1820   PHURINE 5.5 10/12/2015 1820   GLUCOSEU >1000* 10/12/2015 1820   HGBUR LARGE* 10/12/2015 1820   BILIRUBINUR NEGATIVE 10/12/2015 1820   KETONESUR NEGATIVE 10/12/2015 1820   PROTEINUR TRACE* 10/12/2015 1820   NITRITE NEGATIVE 10/12/2015 1820   LEUKOCYTESUR MODERATE* 10/12/2015 1820   Sepsis Labs: @LABRCNTIP (procalcitonin:4,lacticidven:4)  ) Recent Results (from the past 240 hour(s))  Culture, routine-abscess     Status: None   Collection Time: 10/12/15  4:30 PM  Result Value Ref Range Status   Specimen Description ABSCESS RIGHT AXILLA  Final   Special Requests IMMUNE:COMPROMISED  Final   Gram Stain   Final    RARE WBC PRESENT, PREDOMINANTLY MONONUCLEAR NO SQUAMOUS EPITHELIAL CELLS SEEN ABUNDANT GRAM POSITIVE COCCI IN PAIRS IN CLUSTERS FEW GRAM NEGATIVE RODS Performed at [facility]    Culture   Final    MODERATE GROUP B STREP(S.AGALACTIAE)ISOLATED Note: Beta hemolytic streptococci are predictably susceptible to penicillin and other beta lactams. Susceptibility testing not routinely performed. Performed at [facility]    Report Status 10/14/2015 FINAL  Final  Culture, blood (routine x 2)      Status: None (Preliminary result)   Collection Time: 10/12/15  6:07 PM  Result Value Ref Range Status   Specimen Description BLOOD RIGHT ARM  Final   Special Requests BOTTLES DRAWN AEROBIC AND ANAEROBIC 6CC  Final   Culture NO GROWTH 3 DAYS  Final   Report Status PENDING  Incomplete  Culture, Urine     Status: Abnormal (Preliminary result)   Collection Time: 10/12/15  6:20 PM  Result Value Ref Range Status   Specimen Description URINE, RANDOM  Final   Special Requests NONE  Final   Culture >=100,000 COLONIES/mL GRAM NEGATIVE RODS (A)  Final   Report Status PENDING  Incomplete  Culture, blood (routine x 2)     Status: None (Preliminary result)   Collection Time: 10/12/15 10:23 PM  Result Value Ref Range Status   Specimen Description BLOOD RIGHT HAND  Final   Special Requests BOTTLES DRAWN AEROBIC AND ANAEROBIC 6CC  Final  Culture  Setup Time   Final    ANAEROBIC BOTTLE ONLY GRAM NEGATIVE RODS CRITICAL RESULT CALLED TO, READ BACK BY AND VERIFIED WITH: TO JWOODIE(RN) BY TCLEVELAND 10/15/2015 2:57AM Organism ID to follow Performed [facility]    Culture GRAM NEGATIVE RODS  Final   Report Status PENDING  Incomplete  Blood Culture ID Panel (Reflexed)     Status: None   Collection Time: 10/12/15 10:23 PM  Result Value Ref Range Status   Enterococcus species NOT DETECTED NOT DETECTED Final   Vancomycin resistance NOT DETECTED NOT DETECTED Final   Listeria monocytogenes NOT DETECTED NOT DETECTED Final   Staphylococcus species NOT DETECTED NOT DETECTED Final   Staphylococcus aureus NOT DETECTED NOT DETECTED Final   Methicillin resistance NOT DETECTED NOT DETECTED Final   Streptococcus species NOT DETECTED NOT DETECTED Final   Streptococcus agalactiae NOT DETECTED NOT DETECTED Final   Streptococcus pneumoniae NOT DETECTED NOT DETECTED Final   Streptococcus pyogenes NOT DETECTED NOT DETECTED Final   Acinetobacter baumannii NOT DETECTED NOT DETECTED Final   Enterobacteriaceae  species NOT DETECTED NOT DETECTED Final   Enterobacter cloacae complex NOT DETECTED NOT DETECTED Final   Escherichia coli NOT DETECTED NOT DETECTED Final   Klebsiella oxytoca NOT DETECTED NOT DETECTED Final   Klebsiella pneumoniae NOT DETECTED NOT DETECTED Final   Proteus species NOT DETECTED NOT DETECTED Final   Serratia marcescens NOT DETECTED NOT DETECTED Final   Carbapenem resistance NOT DETECTED NOT DETECTED Final   Haemophilus influenzae NOT DETECTED NOT DETECTED Final   Neisseria meningitidis NOT DETECTED NOT DETECTED Final   Pseudomonas aeruginosa NOT DETECTED NOT DETECTED Final   Candida albicans NOT DETECTED NOT DETECTED Final   Candida glabrata NOT DETECTED NOT DETECTED Final   Candida krusei NOT DETECTED NOT DETECTED Final   Candida parapsilosis NOT DETECTED NOT DETECTED Final   Candida tropicalis NOT DETECTED NOT DETECTED Final    Comment: [facility]     Scheduled Meds: . aspirin EC  325 mg Oral Daily  . fluconazole (DIFLUCAN) IV  200 mg Intravenous Q24H  . FLUoxetine  20 mg Oral BID  . heparin  5,000 Units Subcutaneous Q8H  . insulin aspart  0-9 Units Subcutaneous Q4H  . insulin glargine  18 Units Subcutaneous QHS  . metoprolol tartrate  25 mg Oral BID  . piperacillin-tazobactam (ZOSYN)  IV  3.375 g Intravenous Q8H  . pravastatin  40 mg Oral QHS  . sodium chloride flush  3 mL Intravenous Q12H   Continuous Infusions:    LOS: 3 days    Time spent: 25 minutes   Erick BlinksJehanzeb Maansi Wike, MD [facility] Pager [phone]  If 7PM-7AM, please contact night-coverage [email or web address] Password [email or web address] 10/15/2015, 8:19 AM    By signing my name below, I, Burnett HarryJennifer Gregorio, attest that this documentation has been prepared under the direction and in the presence of [facility]han[facility] Nancylee Gaines. MD Electronically Signed: Burnett HarryJennifer Gregorio, Scribe. 10/15/2015  I, Dr. Erick BlinksJehanzeb Duvan Mousel, personally performed the services described in this documentaiton. All medical record  entries made by the scribe were at my direction and in my presence. I have reviewed the chart and agree that the record reflects my personal performance and is accurate and complete  Erick BlinksJehanzeb Heidee Audi, MD, 10/15/2015 1:12 PM

## 2015-10-15 NOTE — Progress Notes (Signed)
Metoprolol held this AM d/t low BP

## 2015-10-16 LAB — BASIC METABOLIC PANEL
ANION GAP: 10 (ref 5–15)
BUN: 79 mg/dL — ABNORMAL HIGH (ref 6–20)
CALCIUM: 8.3 mg/dL — AB (ref 8.9–10.3)
CHLORIDE: 108 mmol/L (ref 101–111)
CO2: 22 mmol/L (ref 22–32)
CREATININE: 1.45 mg/dL — AB (ref 0.44–1.00)
GFR calc non Af Amer: 38 mL/min — ABNORMAL LOW (ref >60)
GFR, EST AFRICAN AMERICAN: 44 mL/min — AB (ref >60)
Glucose, Bld: 89 mg/dL (ref 65–99)
Potassium: 3.3 mmol/L — ABNORMAL LOW (ref 3.5–5.1)
SODIUM: 140 mmol/L (ref 135–145)

## 2015-10-16 LAB — GLUCOSE, CAPILLARY
GLUCOSE-CAPILLARY: 133 mg/dL — AB (ref 65–99)
GLUCOSE-CAPILLARY: 212 mg/dL — AB (ref 65–99)
Glucose-Capillary: 135 mg/dL — ABNORMAL HIGH (ref 65–99)
Glucose-Capillary: 157 mg/dL — ABNORMAL HIGH (ref 65–99)
Glucose-Capillary: 80 mg/dL (ref 65–99)
Glucose-Capillary: 91 mg/dL (ref 65–99)

## 2015-10-16 LAB — CBC
HEMATOCRIT: 23.1 % — AB (ref 36.0–46.0)
Hemoglobin: 7.7 g/dL — ABNORMAL LOW (ref 12.0–15.0)
MCH: 30.8 pg (ref 26.0–34.0)
MCHC: 33.3 g/dL (ref 30.0–36.0)
MCV: 92.4 fL (ref 78.0–100.0)
Platelets: 372 10*3/uL (ref 150–400)
RBC: 2.5 MIL/uL — AB (ref 3.87–5.11)
RDW: 16 % — AB (ref 11.5–15.5)
WBC: 13.6 10*3/uL — AB (ref 4.0–10.5)

## 2015-10-16 MED ORDER — CIPROFLOXACIN IN D5W 400 MG/200ML IV SOLN
400.0000 mg | Freq: Two times a day (BID) | INTRAVENOUS | Status: DC
  Administered 2015-10-16 – 2015-10-19 (×7): 400 mg via INTRAVENOUS
  Filled 2015-10-16 (×7): qty 200

## 2015-10-16 MED ORDER — AMOXICILLIN-POT CLAVULANATE 875-125 MG PO TABS
1.0000 | ORAL_TABLET | Freq: Two times a day (BID) | ORAL | Status: DC
  Administered 2015-10-16 – 2015-10-19 (×7): 1 via ORAL
  Filled 2015-10-16 (×6): qty 1

## 2015-10-16 NOTE — Care Management Note (Signed)
Case Management Note  Patient Details  Name: Barrington EllisonGenevieve A Meckes MRN: [id number] Date of Birth: 05/08/1956  Expected Discharge Date:    10/18/2015              Expected Discharge Plan:  Home w Home Health Services  In-House Referral:  NA  Discharge planning Services  CM Consult  Post Acute Care Choice:  Home Health, Durable Medical Equipment Choice offered to:  Patient  DME Arranged:  Bedside commode DME Agency:  [facility]  HH Arr[facility]  [facility]  Status of Service:  Completed, signed off  Medicare Important Message Given:    yes Date Medicare IM Given:    Medicare IM give by:    Date Additional Medicare IM Given:    Additional Medicare Important Message give by:     If discussed at Long Length of Stay Meetings, dates discussed:    Additional Comments: Pt from home with 'friend' and brother. Pt active with [facility]. Services will resume with HH at DC, per pt's plan. [facility]. Awaiting culture sensitivities.   Malcolm Metrohildress, Kyliah Deanda Demske, RN 10/16/2015, 4:03 PM

## 2015-10-16 NOTE — Progress Notes (Signed)
PROGRESS NOTE    [name]  EAV:[id number] DOB: 10-21-1955 DOA: 10/12/2015 PCP: [name], MD  Outpatient Specialists:    Brief Narrative:  52 yof with a hx of DM type 2 and HTN presented with compalints of a boil on her right axial that she first noted about a week ago. About 4 days PTA, it began draining foul-smelling purulent fluid. She has had similar issues with a left thigh wound which she's had home health services that change her dressings every other day. She also has diabetes that is not well controlled. She underwent I&D of the right axilla and was placed on Vanc. She was also found to have a UTI and was started on Zosyn.   Assessment & Plan: Principal Problem:   Abscess and cellulitis Active Problems:   IDDM (insulin dependent diabetes mellitus) (HCC)   Normocytic anemia   UTI (urinary tract infection)   HLD (hyperlipidemia)   Anemia of chronic disease   Depression   Gram negative sepsis (HCC)  1. Abscess with cellulitis of right axilla and sepsis. Patient initially found to have abscess of right axilla. WBC and lactic acid elevated on admission. Lactic acid trending down.  Wound purulent and producing foul smelling drainage. I&D was performed 5/04 in ER. Wound culture showed group B strep. Vancomycin and Zosyn haves been discontinued. She has been started on Augmentin.   2. UTI. Urine culture shows enterobacter. She  Is being treated with Cipro. 3. Gram negative bacteremia. Blood cultures 1/2 positive for GNR. Continue cipro until further sensitivities available. 4. DM type 2. Glucose 99. Last A1c in 09/2015 elevated at 9.7. Pt would benefit from diabetic education. Continue SSI. Blood sugars are stable.  5. CKD stage 3-4. Cr 1.58. Continue with fluids and monitor. She has an appointment with Dr. Kathrene Bongo next week. 6. Essential HTN. Stable. 7. Anemia of suspected chronic disease. Hgb 7.7. Continue to monitor. No evidence of bleeding.   8. Thrombocytosis. Likely reactive secondary to the above. Continue to monitor. 9. Depression. Continue prozac.  10. HLD. Continue statins. 11. Hypokalemia, replace.    DVT prophylaxis: Heparin Code Status: Full Family Communication: Discussed with patient. No family present at bedside. Disposition Plan: discharge home in 1-2 days.   Consultants:   General surgery  Wound care  Procedures:  I&D 5/04 in ED  Antimicrobials:   Vanc 5/04 >> 5/6  Zosyn 5/04 >>  Subjective:  Feeling better today. No reports of chest pain, shortness of breath, n/v/d, or abd pain.  Objective: Filed Vitals:   10/15/15 0550 10/15/15 0958 10/15/15 1300 10/16/15 0434  BP: 90/59 95/50 101/51 98/59  Pulse: 72 79 81 79  Temp: 97.5 F (36.4 C)  98.1 F (36.7 C) 98.1 F (36.7 C)  TempSrc: Oral  Oral Oral  Resp: Height:      Weight:      SpO2: 100%  100% 100%    Intake/Output Summary (Last 24 hours) at 10/16/15 0855 Last data filed at 10/15/15 1800  Gross per 24 hour  Intake    480 ml  Output      0 ml  Net    480 ml   Filed Weights   10/12/15 1433 10/12/15 2042  Weight: 95.255 kg (210 lb) 87.091 kg (192 lb)    Examination: General exam: Appears calm and comfortable  Respiratory system: Clear to auscultation. Respiratory effort normal. Cardiovascular system: S1 & S2 heard, RRR. No JVD, murmurs, rubs, gallops or clicks. No  pedal edema. Gastrointestinal system: Abdomen is nondistended, soft and nontender. No organomegaly or masses felt. Normal bowel sounds heard. Central nervous system: Alert and oriented. No focal neurological deficits. Extremities: Symmetric 5 x 5 power. Skin: Draining wound of right axilla with purulent fluid. foul smelling Psychiatry: Judgement and insight appear normal. Mood & affect appropriate.     Data Reviewed: I have personally reviewed following labs and imaging studies  CBC:  Recent Labs Lab 10/12/15 1530 10/13/15 0617 10/14/15 0616  10/15/15 0624 10/16/15 0539  WBC 15.9* 16.0* 15.7* 16.8* 13.6*  NEUTROABS 14.3*  --   --   --   --   HGB 8.8* 8.9* 8.3* 8.6* 7.7*  HCT 24.8* 26.4* 23.9* 26.2* 23.1*  MCV 92.9 91.0 93.4 92.3 92.4  PLT 420* 353 397 409* 372   Basic Metabolic Panel:  Recent Labs Lab 10/12/15 1530 10/13/15 0617 10/14/15 0616 10/15/15 0624 10/16/15 0539  NA 125* 132* 132* 138 140  K 4.2 3.5 3.6 3.9 3.3*  CL 90* 101 101 105 108  CO2 19* 21* 19* 22 22  GLUCOSE 557* 183* 246* 99 89  BUN 82* 76* 81* 79* 79*  CREATININE 2.75* 2.12* 1.76* 1.58* 1.45*  CALCIUM 8.5* 8.2* 8.1* 8.4* 8.3*   CBG:  Recent Labs Lab 10/15/15 1629 10/15/15 2037 10/16/15 0055 10/16/15 0434 10/16/15 0732  GLUCAP 186* 209* 135* 80 91   Urine analysis:    Component Value Date/Time   COLORURINE YELLOW 10/12/2015 1820   APPEARANCEUR CLEAR 10/12/2015 1820   LABSPEC 1.015 10/12/2015 1820   PHURINE 5.5 10/12/2015 1820   GLUCOSEU >1000* 10/12/2015 1820   HGBUR LARGE* 10/12/2015 1820   BILIRUBINUR NEGATIVE 10/12/2015 1820   KETONESUR NEGATIVE 10/12/2015 1820   PROTEINUR TRACE* 10/12/2015 1820   NITRITE NEGATIVE 10/12/2015 1820   LEUKOCYTESUR MODERATE* 10/12/2015 1820   Sepsis Labs: @LABRCNTIP (procalcitonin:4,lacticidven:4)  ) Recent Results (from the past 240 hour(s))  Culture, routine-abscess     Status: None   Collection Time: 10/12/15  4:30 PM  Result Value Ref Range Status   Specimen Description ABSCESS RIGHT AXILLA  Final   Special Requests IMMUNE:COMPROMISED  Final   Gram Stain   Final    RARE WBC PRESENT, PREDOMINANTLY MONONUCLEAR NO SQUAMOUS EPITHELIAL CELLS SEEN ABUNDANT GRAM POSITIVE COCCI IN PAIRS IN CLUSTERS FEW GRAM NEGATIVE RODS Performed at [facility]    Culture   Final    MODERATE GROUP B STREP(S.AGALACTIAE)ISOLATED Note: Beta hemolytic streptococci are predictably susceptible to penicillin and other beta lactams. Susceptibility testing not routinely performed. Performed at [facility]    Report Status 10/14/2015 FINAL  Final  Culture, blood (routine x 2)     Status: None (Preliminary result)   Collection Time: 10/12/15  6:07 PM  Result Value Ref Range Status   Specimen Description BLOOD RIGHT ARM  Final   Special Requests BOTTLES DRAWN AEROBIC AND ANAEROBIC 6CC  Final   Culture NO GROWTH 3 DAYS  Final   Report Status PENDING  Incomplete  Culture, Urine     Status: Abnormal   Collection Time: 10/12/15  6:20 PM  Result Value Ref Range Status   Specimen Description URINE, RANDOM  Final   Special Requests NONE  Final   Culture >=100,000 COLONIES/mL ENTEROBACTER AEROGENES (A)  Final   Report Status 10/15/2015 FINAL  Final   Organism ID, Bacteria ENTEROBACTER AEROGENES (A)  Final      Susceptibility   Enterobacter aerogenes - MIC*    CEFAZOLIN >=64 RESISTANT Resistant  CEFTRIAXONE 16 INTERMEDIATE Intermediate     CIPROFLOXACIN <=0.25 SENSITIVE Sensitive     GENTAMICIN <=1 SENSITIVE Sensitive     IMIPENEM 1 SENSITIVE Sensitive     NITROFURANTOIN 64 INTERMEDIATE Intermediate     TRIMETH/SULFA <=20 SENSITIVE Sensitive     PIP/TAZO >=128 RESISTANT Resistant     * >=100,000 COLONIES/mL ENTEROBACTER AEROGENES  Culture, blood (routine x 2)     Status: None (Preliminary result)   Collection Time: 10/12/15 10:23 PM  Result Value Ref Range Status   Specimen Description BLOOD RIGHT HAND  Final   Special Requests BOTTLES DRAWN AEROBIC AND ANAEROBIC 6CC  Final   Culture  Setup Time   Final    ANAEROBIC BOTTLE ONLY GRAM NEGATIVE RODS CRITICAL RESULT CALLED TO, READ BACK BY AND VERIFIED WITH: TO JWOODIE(RN) BY TCLEVELAND 10/15/2015 2:57AM Organism ID to follow Performed at [facility]    Culture GRAM NEGATIVE RODS  Final   Report Status PENDING  Incomplete  Blood Culture ID Panel (Reflexed)     Status: None   Collection Time: 10/12/15 10:23 PM  Result Value Ref Range Status   Enterococcus species NOT DETECTED NOT DETECTED Final   Vancomycin  resistance NOT DETECTED NOT DETECTED Final   Listeria monocytogenes NOT DETECTED NOT DETECTED Final   Staphylococcus species NOT DETECTED NOT DETECTED Final   Staphylococcus aureus NOT DETECTED NOT DETECTED Final   Methicillin resistance NOT DETECTED NOT DETECTED Final   Streptococcus species NOT DETECTED NOT DETECTED Final   Streptococcus agalactiae NOT DETECTED NOT DETECTED Final   Streptococcus pneumoniae NOT DETECTED NOT DETECTED Final   Streptococcus pyogenes NOT DETECTED NOT DETECTED Final   Acinetobacter baumannii NOT DETECTED NOT DETECTED Final   Enterobacteriaceae species NOT DETECTED NOT DETECTED Final   Enterobacter cloacae complex NOT DETECTED NOT DETECTED Final   Escherichia coli NOT DETECTED NOT DETECTED Final   Klebsiella oxytoca NOT DETECTED NOT DETECTED Final   Klebsiella pneumoniae NOT DETECTED NOT DETECTED Final   Proteus species NOT DETECTED NOT DETECTED Final   Serratia marcescens NOT DETECTED NOT DETECTED Final   Carbapenem resistance NOT DETECTED NOT DETECTED Final   Haemophilus influenzae NOT DETECTED NOT DETECTED Final   Neisseria meningitidis NOT DETECTED NOT DETECTED Final   Pseudomonas aeruginosa NOT DETECTED NOT DETECTED Final   Candida albicans NOT DETECTED NOT DETECTED Final   Candida glabrata NOT DETECTED NOT DETECTED Final   Candida krusei NOT DETECTED NOT DETECTED Final   Candida parapsilosis NOT DETECTED NOT DETECTED Final   Candida tropicalis NOT DETECTED NOT DETECTED Final    Comment: [facility]     Scheduled Meds: . aspirin EC  325 mg Oral Daily  . ciprofloxacin  400 mg Intravenous Q12H  . fluconazole (DIFLUCAN) IV  200 mg Intravenous Q24H  . FLUoxetine  20 mg Oral BID  . heparin  5,000 Units Subcutaneous Q8H  . insulin aspart  0-9 Units Subcutaneous Q4H  . insulin glargine  18 Units Subcutaneous QHS  . metoprolol tartrate  25 mg Oral BID  . pravastatin  40 mg Oral QHS  . sodium chloride flush  3 mL Intravenous Q12H    Continuous Infusions:    LOS: 4 days    Time spent: 25 minutes   Erick BlinksJehanzeb Memon, MD [facility] Pager [phone]  If 7PM-7AM, please contact night-coverage [email or web address] Password [email or web address] 10/16/2015, 8:55 AM    By signing my name below, I, Zadie CleverlyJessica Augustus, attest that this documentation has been prepared under the direction  and in the presence of Erick Blinks, MD. Electronically signed: Zadie Cleverly, Scribe. 10/16/2015 1:07pm  I, Dr. Erick Blinks, personally performed the services described in this documentaiton. All medical record entries made by the scribe were at my direction and in my presence. I have reviewed the chart and agree that the record reflects my personal performance and is accurate and complete  Erick Blinks, MD, 10/16/2015 1:20 PM

## 2015-10-16 NOTE — Care Management Important Message (Deleted)
Important Message  Patient Details  Name: Barrington EllisonGenevieve A Leccese MRN: [id number] Date of Birth: [birth year]   Medicare Important Message Given:  Yes    Malcolm MetroChildress, Talullah Abate Demske, RN 10/16/2015, 1:23 PM

## 2015-10-17 LAB — CBC
HEMATOCRIT: 24.2 % — AB (ref 36.0–46.0)
HEMOGLOBIN: 7.8 g/dL — AB (ref 12.0–15.0)
MCH: 30 pg (ref 26.0–34.0)
MCHC: 32.2 g/dL (ref 30.0–36.0)
MCV: 93.1 fL (ref 78.0–100.0)
PLATELETS: 317 10*3/uL (ref 150–400)
RBC: 2.6 MIL/uL — AB (ref 3.87–5.11)
RDW: 16.1 % — ABNORMAL HIGH (ref 11.5–15.5)
WBC: 10.6 10*3/uL — AB (ref 4.0–10.5)

## 2015-10-17 LAB — GLUCOSE, CAPILLARY
GLUCOSE-CAPILLARY: 73 mg/dL (ref 65–99)
GLUCOSE-CAPILLARY: 44 mg/dL — AB (ref 65–99)
GLUCOSE-CAPILLARY: 139 mg/dL — AB (ref 65–99)
GLUCOSE-CAPILLARY: 261 mg/dL — AB (ref 65–99)
Glucose-Capillary: 125 mg/dL — ABNORMAL HIGH (ref 65–99)
Glucose-Capillary: 136 mg/dL — ABNORMAL HIGH (ref 65–99)
Glucose-Capillary: 53 mg/dL — ABNORMAL LOW (ref 65–99)

## 2015-10-17 LAB — BASIC METABOLIC PANEL
ANION GAP: 11 (ref 5–15)
BUN: 77 mg/dL — ABNORMAL HIGH (ref 6–20)
CHLORIDE: 108 mmol/L (ref 101–111)
CO2: 23 mmol/L (ref 22–32)
CREATININE: 1.73 mg/dL — AB (ref 0.44–1.00)
Calcium: 8.6 mg/dL — ABNORMAL LOW (ref 8.9–10.3)
GFR calc non Af Amer: 31 mL/min — ABNORMAL LOW (ref >60)
GFR, EST AFRICAN AMERICAN: 36 mL/min — AB (ref >60)
Glucose, Bld: 101 mg/dL — ABNORMAL HIGH (ref 65–99)
POTASSIUM: 3.2 mmol/L — AB (ref 3.5–5.1)
SODIUM: 142 mmol/L (ref 135–145)

## 2015-10-17 LAB — CULTURE, BLOOD (ROUTINE X 2): Culture: NO GROWTH

## 2015-10-17 MED ORDER — GLUCOSE 40 % PO GEL
1.0000 | Freq: Once | ORAL | Status: AC
Start: 2015-10-17 — End: 2015-10-17
  Administered 2015-10-17: 37.5 g via ORAL

## 2015-10-17 MED ORDER — GLUCOSE 40 % PO GEL
ORAL | Status: AC
  Administered 2015-10-17: 09:00:00
  Filled 2015-10-17: qty 1

## 2015-10-17 MED ORDER — ENSURE ENLIVE PO LIQD
237.0000 mL | Freq: Two times a day (BID) | ORAL | Status: DC
  Administered 2015-10-17 – 2015-10-19 (×5): 237 mL via ORAL

## 2015-10-17 MED ORDER — INSULIN GLARGINE 100 UNIT/ML ~~LOC~~ SOLN
13.0000 [IU] | Freq: Every day | SUBCUTANEOUS | Status: DC
  Administered 2015-10-17 – 2015-10-18 (×2): 13 [IU] via SUBCUTANEOUS
  Filled 2015-10-17 (×3): qty 0.13

## 2015-10-17 NOTE — Progress Notes (Signed)
Inpatient Diabetes Program Recommendations  AACE/ADA: New Consensus Statement on Inpatient Glycemic Control (2015)  Target Ranges:  Prepandial:   less than 140 mg/dL      Peak postprandial:   less than 180 mg/dL (1-2 hours)      Critically ill patients:  140 - 180 mg/dL   5/9 Results for [name], [name] (MRN [id number]) as of 10/17/2015 11:23  Ref. Range 10/17/2015 00:45 10/17/2015 04:23 10/17/2015 07:20 10/17/2015 07:50 10/17/2015 11:07  Glucose-Capillary Latest Ref Range: 65-99 mg/dL 409136 (H) 73 44 (LL) 53 (L) 125 (H)  Noted decrease in Lantus to 13 units at bedtime.  Nursing states that pt's po intake has been poor.  However she did drink her Ensure.  Will follow. Mellissa KohutSherrie Dahmir Epperly RD, CDE. M.Ed. Pager [phone] Inpatient Diabetes Coordinator

## 2015-10-17 NOTE — Progress Notes (Signed)
Physical Therapy Treatment Patient Details Name: [name] MRN: [id number] DOB: 09/07/1955 Today's Date: 10/17/2015    History of Present Illness 60 yo F admitted with sepsis due to abcess of R axilla - s/p I&D on 10/12/15, UTI with yeast.  PMH: HTN, DM, necrotizing fascitis R LE.    PT Comments    Pt received in bed, and was agreeable to PT tx, however states that she is soiled.  Pt assisted with peri-hygiene, and then was able to transfer to sitting EOB.  While seated she was able to work on upright sitting balance while performing lower body hygiene independently.  Pt then required Min guard for sit<>stand transfer with RW.  Pt was able to transfer from the bed<>chair with RW.  Continue to recommend HHPT upon d/c   Follow Up Recommendations  Home health PT;Supervision - Intermittent     Equipment Recommendations  None recommended by PT    Recommendations for Other Services       Precautions / Restrictions Precautions Precautions: Fall Restrictions Weight Bearing Restrictions: No    Mobility  Bed Mobility Overal bed mobility: Needs Assistance Bed Mobility: Supine to Sit     Supine to sit: HOB elevated;Min guard     General bed mobility comments: Pt assisted with raising trunk  Transfers Overall transfer level: Needs assistance Equipment used: Rolling walker (2 wheeled)   Sit to Stand: Min guard         General transfer comment: Pt required increased time to get weight shifted fwd, and cues to push up from the bed instead of trying to pull on the RW.   Ambulation/Gait Ambulation/Gait assistance: Min guard Ambulation Distance (Feet): 4 Feet Assistive device: Rolling walker (2 wheeled) Gait Pattern/deviations: Step-to pattern     General Gait Details: Pt was able to take a few steps from the bed<>chair with RW.    Stairs            Wheelchair Mobility    Modified Rankin (Stroke Patients Only)       Balance Overall balance assessment:  Needs assistance   Sitting balance-Leahy Scale: Good Sitting balance - Comments: Pt was able to sit on the EOB and perform personal hygiene of front and lower body.    Standing balance support: Bilateral upper extremity supported Standing balance-Leahy Scale: Fair                      Cognition Arousal/Alertness: Awake/alert Behavior During Therapy: WFL for tasks assessed/performed Overall Cognitive Status: Within Functional Limits for tasks assessed                      Exercises      General Comments        Pertinent Vitals/Pain Pain Assessment: No/denies pain    Home Living                      Prior Function            PT Goals (current goals can now be found in the care plan section) Acute Rehab PT Goals Patient Stated Goal: To go home PT Goal Formulation: With patient Time For Goal Achievement: 10/20/15 Potential to Achieve Goals: Good Progress towards PT goals: Progressing toward goals    Frequency  Min 3X/week    PT Plan Current plan remains appropriate    Co-evaluation             End of Session  Equipment Utilized During Treatment: Gait belt Activity Tolerance: Patient tolerated treatment well Patient left: in chair;with call bell/phone within reach     Time: [id number] PT Time Calculation (min) (ACUTE ONLY): 33 min  Charges:  $Therapeutic Activity: 23-37 mins                    G Codes:      Beth Ayriana Wix, PT, DPT X: [id number]   10/17/2015, 2:41 PM

## 2015-10-17 NOTE — Progress Notes (Signed)
PROGRESS NOTE    [name]  ZOX:[id number] DOB: Feb 10, 1956 DOA: 10/12/2015 PCP: [name], MD  Outpatient Specialists:    Brief Narrative:  21 yof with a hx of DM type 2 and HTN presented with compalints of a boil in her right axilla that she first noted about a week ago. About 4 days PTA, it began draining foul-smelling purulent fluid. She has had similar issues with a left thigh wound which she's had home health services that change her dressings every other day. She also has diabetes that is not well controlled. She underwent I&D of the right axilla in the emergency room and was placed on Vanc. Cultures from her wound were positive for group B strep, and she has since been transitioned to Augmentin. Blood cx were positive for gram-negative rods. She is currently on Cipro, which can be adjusted based on Cx identification. Discharge home once blood cx has been further identified. She was also found to have a UTI and was started on Zosyn.   Assessment & Plan: Principal Problem:   Abscess and cellulitis Active Problems:   IDDM (insulin dependent diabetes mellitus) (HCC)   Normocytic anemia   UTI (urinary tract infection)   HLD (hyperlipidemia)   Anemia of chronic disease   Depression   Gram negative sepsis (HCC)  1. Abscess with cellulitis of right axilla and sepsis. Patient initially found to have abscess of right axilla. WBC and lactic acid elevated on admission. Lactic acid trending down.  Wound purulent and producing foul smelling drainage. I&D was performed 5/04 in ER. Pt was seen by general surgery and no further surgical intervention was felt necessary. Wound culture showed group B strep. Continue Augmentin.   2. UTI. Urine culture shows enterobacter. She is being treated with Cipro. 3. Gram negative bacteremia. Blood cultures 1/2 positive for GNR. Continue cipro until further sensitivities available. 4. DM type 2. Glucose 99. Last A1c in 09/2015 elevated at 9.7. Pt  would benefit from diabetic education. Continue SSI. Blood sugars are stable.  5. CKD stage 3-4. Cr 1.45. Continue with fluids and monitor. She has an appointment with Dr. Kathrene Bongo next week. 6. Essential HTN. Stable. 7. Anemia of suspected chronic disease. Hgb 7.7. Continue to monitor. No evidence of bleeding.  8. Thrombocytosis. Likely reactive secondary to the above. Continue to monitor. 9. Depression. Continue prozac.  10. HLD. Continue statins. 11. Hypokalemia, replaced.    DVT prophylaxis: Heparin Code Status: Full Family Communication: Discussed with patient. No family present at bedside. Disposition Plan: discharge home in 1-2 days.   Consultants:   General surgery  Wound care  Procedures:  I&D 5/04 in ED  Antimicrobials:   Vanc 5/04 >> 5/6  Zosyn 5/04 >> 5/08  Augmentin 5/08 >>  Cipro 5/08 >>  Subjective:  Feels improved today. Her arm does not hurt as much. She iis eating and drinking well.   Objective: Filed Vitals:   10/16/15 0434 10/16/15 1608 10/16/15 2210 10/17/15 0424  BP: 98/59 112/55 89/59 107/62  Pulse: 79 77 84 76  Temp: 98.1 F (36.7 C) 98.3 F (36.8 C) 98.1 F (36.7 C) 98 F (36.7 C)  TempSrc: Oral Oral Oral Oral  Resp: 20 18 20 20   Height:      Weight:      SpO2: 100% 100% 98% 97%    Intake/Output Summary (Last 24 hours) at 10/17/15 1450 Last data filed at 10/17/15 1300  Gross per 24 hour  Intake    240 ml  Output  0 ml  Net    240 ml   Filed Weights   10/12/15 1433 10/12/15 2042  Weight: 95.255 kg (210 lb) 87.091 kg (192 lb)    Examination: General exam: Appears calm and comfortable  Respiratory system: Clear to auscultation. Respiratory effort normal. Cardiovascular system: S1 & S2 heard, RRR. No JVD, murmurs, rubs, gallops or clicks. No pedal edema. Gastrointestinal system: Abdomen is nondistended, soft and nontender. No organomegaly or masses felt. Normal bowel sounds heard. Central nervous system: Alert and  oriented. No focal neurological deficits. Extremities: Symmetric 5 x 5 power. Skin: Draining wound of right axilla with purulent fluid. foul smelling Psychiatry: Judgement and insight appear normal. Mood & affect appropriate.     Data Reviewed: I have personally reviewed following labs and imaging studies  CBC:  Recent Labs Lab 10/12/15 1530 10/13/15 0617 10/14/15 0616 10/15/15 0624 10/16/15 0539 10/17/15 0909  WBC 15.9* 16.0* 15.7* 16.8* 13.6* 10.6*  NEUTROABS 14.3*  --   --   --   --   --   HGB 8.8* 8.9* 8.3* 8.6* 7.7* 7.8*  HCT 24.8* 26.4* 23.9* 26.2* 23.1* 24.2*  MCV 92.9 91.0 93.4 92.3 92.4 93.1  PLT 420* 353 397 409* 372 317   Basic Metabolic Panel:  Recent Labs Lab 10/13/15 0617 10/14/15 0616 10/15/15 0624 10/16/15 0539 10/17/15 0909  NA 132* 132* 138 140 142  K 3.5 3.6 3.9 3.3* 3.2*  CL 101 101 105 108 108  CO2 21* 19* 22 22 23   GLUCOSE 183* 246* 99 89 101*  BUN 76* 81* 79* 79* 77*  CREATININE 2.12* 1.76* 1.58* 1.45* 1.73*  CALCIUM 8.2* 8.1* 8.4* 8.3* 8.6*   CBG:  Recent Labs Lab 10/17/15 0045 10/17/15 0423 10/17/15 0720 10/17/15 0750 10/17/15 1107  GLUCAP 136* 73 44* 53* 125*   Urine analysis:    Component Value Date/Time   COLORURINE YELLOW 10/12/2015 1820   APPEARANCEUR CLEAR 10/12/2015 1820   LABSPEC 1.015 10/12/2015 1820   PHURINE 5.5 10/12/2015 1820   GLUCOSEU >1000* 10/12/2015 1820   HGBUR LARGE* 10/12/2015 1820   BILIRUBINUR NEGATIVE 10/12/2015 1820   KETONESUR NEGATIVE 10/12/2015 1820   PROTEINUR TRACE* 10/12/2015 1820   NITRITE NEGATIVE 10/12/2015 1820   LEUKOCYTESUR MODERATE* 10/12/2015 1820   Sepsis Labs: @LABRCNTIP (procalcitonin:4,lacticidven:4)  ) Recent Results (from the past 240 hour(s))  Culture, routine-abscess     Status: None   Collection Time: 10/12/15  4:30 PM  Result Value Ref Range Status   Specimen Description ABSCESS RIGHT AXILLA  Final   Special Requests IMMUNE:COMPROMISED  Final   Gram Stain   Final     RARE WBC PRESENT, PREDOMINANTLY MONONUCLEAR NO SQUAMOUS EPITHELIAL CELLS SEEN ABUNDANT GRAM POSITIVE COCCI IN PAIRS IN CLUSTERS FEW GRAM NEGATIVE RODS Performed at [facility]    Culture   Final    MODERATE GROUP B STREP(S.AGALACTIAE)ISOLATED Note: Beta hemolytic streptococci are predictably susceptible to penicillin and other beta lactams. Susceptibility testing not routinely performed. Performed at [facility]    Report Status 10/14/2015 FINAL  Final  Culture, blood (routine x 2)     Status: None   Collection Time: 10/12/15  6:07 PM  Result Value Ref Range Status   Specimen Description BLOOD RIGHT ARM  Final   Special Requests BOTTLES DRAWN AEROBIC AND ANAEROBIC 6CC  Final   Culture NO GROWTH 5 DAYS  Final   Report Status 10/17/2015 FINAL  Final  Culture, Urine     Status: Abnormal   Collection Time:  10/12/15  6:20 PM  Result Value Ref Range Status   Specimen Description URINE, RANDOM  Final   Special Requests NONE  Final   Culture >=100,000 COLONIES/mL ENTEROBACTER AEROGENES (A)  Final   Report Status 10/15/2015 FINAL  Final   Organism ID, Bacteria ENTEROBACTER AEROGENES (A)  Final      Susceptibility   Enterobacter aerogenes - MIC*    CEFAZOLIN >=64 RESISTANT Resistant     CEFTRIAXONE 16 INTERMEDIATE Intermediate     CIPROFLOXACIN <=0.25 SENSITIVE Sensitive     GENTAMICIN <=1 SENSITIVE Sensitive     IMIPENEM 1 SENSITIVE Sensitive     NITROFURANTOIN 64 INTERMEDIATE Intermediate     TRIMETH/SULFA <=20 SENSITIVE Sensitive     PIP/TAZO >=128 RESISTANT Resistant     * >=100,000 COLONIES/mL ENTEROBACTER AEROGENES  Culture, blood (routine x 2)     Status: None (Preliminary result)   Collection Time: 10/12/15 10:23 PM  Result Value Ref Range Status   Specimen Description BLOOD RIGHT HAND  Final   Special Requests BOTTLES DRAWN AEROBIC AND ANAEROBIC 6CC  Final   Culture  Setup Time   Final    ANAEROBIC BOTTLE ONLY GRAM NEGATIVE RODS CRITICAL RESULT CALLED  TO, READ BACK BY AND VERIFIED WITH: TO JWOODIE(RN) BY TCLEVELAND 10/15/2015 2:57AM Organism ID to follow    Culture   Final    GRAM NEGATIVE RODS CULTURE REINCUBATED FOR BETTER GROWTH [facility]    Report Status PENDING  Incomplete  Blood Culture ID Panel (Reflexed)     Status: None   Collection Time: 10/12/15 10:23 PM  Result Value Ref Range Status   Enterococcus species NOT DETECTED NOT DETECTED Final   Vancomycin resistance NOT DETECTED NOT DETECTED Final   Listeria monocytogenes NOT DETECTED NOT DETECTED Final   Staphylococcus species NOT DETECTED NOT DETECTED Final   Staphylococcus aureus NOT DETECTED NOT DETECTED Final   Methicillin resistance NOT DETECTED NOT DETECTED Final   Streptococcus species NOT DETECTED NOT DETECTED Final   Streptococcus agalactiae NOT DETECTED NOT DETECTED Final   Streptococcus pneumoniae NOT DETECTED NOT DETECTED Final   Streptococcus pyogenes NOT DETECTED NOT DETECTED Final   Acinetobacter baumannii NOT DETECTED NOT DETECTED Final   Enterobacteriaceae species NOT DETECTED NOT DETECTED Final   Enterobacter cloacae complex NOT DETECTED NOT DETECTED Final   Escherichia coli NOT DETECTED NOT DETECTED Final   Klebsiella oxytoca NOT DETECTED NOT DETECTED Final   Klebsiella pneumoniae NOT DETECTED NOT DETECTED Final   Proteus species NOT DETECTED NOT DETECTED Final   Serratia marcescens NOT DETECTED NOT DETECTED Final   Carbapenem resistance NOT DETECTED NOT DETECTED Final   Haemophilus influenzae NOT DETECTED NOT DETECTED Final   Neisseria meningitidis NOT DETECTED NOT DETECTED Final   Pseudomonas aeruginosa NOT DETECTED NOT DETECTED Final   Candida albicans NOT DETECTED NOT DETECTED Final   Candida glabrata NOT DETECTED NOT DETECTED Final   Candida krusei NOT DETECTED NOT DETECTED Final   Candida parapsilosis NOT DETECTED NOT DETECTED Final   Candida tropicalis NOT DETECTED NOT DETECTED Final    Comment: Performed at [facility]     Scheduled Meds: . amoxicillin-clavulanate  1 tablet Oral BID  . aspirin EC  325 mg Oral Daily  . ciprofloxacin  400 mg Intravenous Q12H  . feeding supplement (ENSURE ENLIVE)  237 mL Oral BID BM  . fluconazole (DIFLUCAN) IV  200 mg Intravenous Q24H  . FLUoxetine  20 mg Oral BID  . heparin  5,000 Units  Subcutaneous Q8H  . insulin aspart  0-9 Units Subcutaneous Q4H  . insulin glargine  13 Units Subcutaneous QHS  . metoprolol tartrate  25 mg Oral BID  . pravastatin  40 mg Oral QHS  . sodium chloride flush  3 mL Intravenous Q12H   Continuous Infusions:    LOS: 5 days    Time spent: 25 minutes   Erick Blinks, MD [facility] Pager [phone]  If 7PM-7AM, please contact night-coverage [email or web address] Password [email or web address] 10/17/2015, 2:50 PM    By signing my name below, I, Adron Bene, attest that this documentation has been prepared under the direction and in the presence of Erick Blinks, MD. Electronically Signed: Adron Bene 10/17/2015 2:40pm  I, Dr. Erick Blinks, personally performed the services described in this documentaiton. All medical record entries made by the scribe were at my direction and in my presence. I have reviewed the chart and agree that the record reflects my personal performance and is accurate and complete  Erick Blinks, MD, 10/17/2015 2:51 PM

## 2015-10-18 DIAGNOSIS — B9689 Other specified bacterial agents as the cause of diseases classified elsewhere: Secondary | ICD-10-CM

## 2015-10-18 DIAGNOSIS — N3000 Acute cystitis without hematuria: Secondary | ICD-10-CM | POA: Insufficient documentation

## 2015-10-18 DIAGNOSIS — R7881 Bacteremia: Secondary | ICD-10-CM

## 2015-10-18 LAB — CBC
HCT: 23 % — ABNORMAL LOW (ref 36.0–46.0)
Hemoglobin: 7.4 g/dL — ABNORMAL LOW (ref 12.0–15.0)
MCH: 30.1 pg (ref 26.0–34.0)
MCHC: 32.2 g/dL (ref 30.0–36.0)
MCV: 93.5 fL (ref 78.0–100.0)
PLATELETS: 323 10*3/uL (ref 150–400)
RBC: 2.46 MIL/uL — AB (ref 3.87–5.11)
RDW: 16.2 % — AB (ref 11.5–15.5)
WBC: 14 10*3/uL — ABNORMAL HIGH (ref 4.0–10.5)

## 2015-10-18 LAB — BASIC METABOLIC PANEL
Anion gap: 9 (ref 5–15)
BUN: 74 mg/dL — AB (ref 6–20)
CALCIUM: 8.6 mg/dL — AB (ref 8.9–10.3)
CO2: 23 mmol/L (ref 22–32)
CREATININE: 1.92 mg/dL — AB (ref 0.44–1.00)
Chloride: 107 mmol/L (ref 101–111)
GFR calc Af Amer: 32 mL/min — ABNORMAL LOW (ref >60)
GFR, EST NON AFRICAN AMERICAN: 27 mL/min — AB (ref >60)
Glucose, Bld: 114 mg/dL — ABNORMAL HIGH (ref 65–99)
POTASSIUM: 3.2 mmol/L — AB (ref 3.5–5.1)
SODIUM: 139 mmol/L (ref 135–145)

## 2015-10-18 LAB — GLUCOSE, CAPILLARY
GLUCOSE-CAPILLARY: 125 mg/dL — AB (ref 65–99)
GLUCOSE-CAPILLARY: 106 mg/dL — AB (ref 65–99)
GLUCOSE-CAPILLARY: 129 mg/dL — AB (ref 65–99)
GLUCOSE-CAPILLARY: 100 mg/dL — AB (ref 65–99)
Glucose-Capillary: 159 mg/dL — ABNORMAL HIGH (ref 65–99)

## 2015-10-18 MED ORDER — POTASSIUM CHLORIDE CRYS ER 20 MEQ PO TBCR
40.0000 meq | EXTENDED_RELEASE_TABLET | Freq: Once | ORAL | Status: AC
  Administered 2015-10-18: 40 meq via ORAL
  Filled 2015-10-18: qty 2

## 2015-10-18 NOTE — Progress Notes (Signed)
Physical Therapy Treatment Patient Details Name: Sophia EllisonGenevieve A [name] MRN: [id number] DOB: August 19, 1955 Today's Date: 10/18/2015    History of Present Illness 60 yo F admitted with sepsis due to abcess of R axilla - s/p I&D on 10/12/15, UTI with yeast.  PMH: HTN, DM, necrotizing fascitis R LE.    PT Comments    Pt received sitting up in the chair, and was agreeable to PT tx.  Pt was able to improve gait distance today, and ambulated 2 x 8320ft with RW and CGA.  Pt also improved transfers when given extensive education on technique.  Pt now able to perform sit<>stand transfer with supervision and increased time.  Continue to recommend HHPT upon d/c.   Follow Up Recommendations  Home health PT;Supervision - Intermittent (Pt states that she has a Research scientist (physical sciences)healthcare worker, her brother or her sister there at the house with her all the time. )     Equipment Recommendations  None recommended by PT    Recommendations for Other Services       Precautions / Restrictions      Mobility  Bed Mobility                  Transfers Overall transfer level: Needs assistance Equipment used: Rolling walker (2 wheeled) Transfers: Sit to/from Stand Sit to Stand: Supervision;Min guard         General transfer comment: Pt required extensive education on proper sit<>stand transfer technique including hand placement, and weight shifting.   Ambulation/Gait Ambulation/Gait assistance: Min guard;Supervision Ambulation Distance (Feet): 40 Feet Assistive device: Rolling walker (2 wheeled) Gait Pattern/deviations: Step-to pattern     General Gait Details: Extreme decrease in cadence with seated rest break after the first 5320ft, and then ambulated another 3020ft.     Stairs            Wheelchair Mobility    Modified Rankin (Stroke Patients Only)       Balance           Standing balance support: Bilateral upper extremity supported Standing balance-Leahy Scale: Fair                       Cognition Arousal/Alertness: Awake/alert Behavior During Therapy: WFL for tasks assessed/performed Overall Cognitive Status: Within Functional Limits for tasks assessed                      Exercises      General Comments        Pertinent Vitals/Pain Pain Assessment: 0-10 Pain Score: 2  Pain Location: R arm Pain Descriptors / Indicators: Sore Pain Intervention(s): Limited activity within patient's tolerance    Home Living                      Prior Function            PT Goals (current goals can now be found in the care plan section) Acute Rehab PT Goals Patient Stated Goal: To go home PT Goal Formulation: With patient Time For Goal Achievement: 10/20/15 Potential to Achieve Goals: Good Progress towards PT goals: Progressing toward goals    Frequency  Min 3X/week    PT Plan Current plan remains appropriate    Co-evaluation             End of Session Equipment Utilized During Treatment: Gait belt Activity Tolerance: Patient tolerated treatment well Patient left: in chair;with call bell/phone within reach  Time: 1610-9604 PT Time Calculation (min) (ACUTE ONLY): 23 min  Charges:  $Gait Training: 8-22 mins $Therapeutic Activity: 8-22 mins                    G Codes:      Beth Izyk Marty, PT, DPT X: [id number]  10/18/2015, 12:06 PM

## 2015-10-18 NOTE — Progress Notes (Signed)
PROGRESS NOTE  Sophia EllisonGenevieve A [name] ZOX:[id number] DOB: 1955-12-21 DOA: 10/12/2015 PCP: Sophia BostonAPPER,Sophia B, MD  Brief Narrative: 60 year old woman with diabetes mellitus, status post necrotizing fasciitis March 2017, status post acute renal failure April 2017, presented with abscess right axilla. Cesarean drainage in the emergency department was performed. Wound culture grew group B strep and patient was transitioned to Augmentin. Blood cultures positive for gram-negative rods, further identification pending. Likely secondary to UTI based on urine culture results.  Assessment/Plan: 1. Sepsis secondary to abscess right axilla. Clinically improving. Culture group B strep, antibiotic narrative Augmentin. Seen by general surgery with recommendations of conservative management. 2. Enterobacter UTI. Continue Cipro. 3. Gram-negative bacteremia. Hemodynamics stable. Afebrile. Likely Enterobacter. Awaiting full culture results and sensitivities. 4. DM type 2. A1c from 09/2015 elevated at 9.7. Blood sugars stable. 5. CKD stage 3. Cr 1.92. She has an appointment with Dr. Kathrene [name] next week. 6. Essential HTN. Stable.  7. Anemia of suspected chronic disease. No evidence of bleeding. Hgb 7.4. Continue to monitor.  8. Hypokalemia. Replaced. 9. Status post acute renal failure 09/2015, thought to be secondary to ATN. 10. Status post necrotizing fasciitis 08/2015   Improving clinically. Continue Augmentin and Cipro.  Follow-up blood culture results.  [name] likely discharge 5/11 and blood culture results available.    DVT prophylaxis: Heparin Code Status: Full Family Communication: Discussed with patient. No family present at bedside. Disposition Plan: discharge home with [facility] RN, PT  Sophia Sacksaniel Shamecka Hocutt, MD  [facility] Direct contact:  --Via amion app OR  --[email or web address]; password [email or web address] and click  7PM-7AM contact night coverage as above 10/18/2015, 4:48 PM  LOS: 6 days   Consultants:   General  surgery  Wound care  Procedures:  I&D 5/04 in ED  Antimicrobials:   Vanc 5/04 >> 5/6  Zosyn 5/04 >> 5/08  Augmentin 5/08 >>  Cipro 5/08 >>  HPI/Subjective: Feels well today. Her right arm feels much improved. Denies any n/v. No trouble breathing. Reports decreased appetite.   Objective: Filed Vitals:   10/17/15 1500 10/17/15 2317 10/18/15 0700 10/18/15 1237  BP: 74/46 121/65 125/69 101/58  Pulse: 79 86 76 84  Temp: 98.1 F (36.7 C) 98.4 F (36.9 C) 98.2 F (36.8 C) 98 F (36.7 C)  TempSrc: Oral Oral Oral Oral  Resp: 20 20 18 20   Height:      Weight:      SpO2: 100% 99% 100% 100%    Intake/Output Summary (Last 24 hours) at 10/18/15 1648 Last data filed at 10/17/15 1811  Gross per 24 hour  Intake    240 ml  Output      0 ml  Net    240 ml     Filed Weights   10/12/15 1433 10/12/15 2042  Weight: 95.255 kg (210 lb) 87.091 kg (192 lb)    Exam:    Constitutional:  . Appears calm and comfortable Respiratory:  . CTA bilaterally, no w/r/r.  . Respiratory effort normal. No retractions or accessory muscle use Cardiovascular:  . RRR, no m/r/g . No LE extremity edema   Abdomen:  . Abdomen appears normal; no tenderness or masses Skin:  . Bandaged right axilla Psychiatric:  . judgement and insight appear normal . Mental status o Mood, affect appropriate  I have personally reviewed following labs and imaging studies:  BMP stable, potassium 3.2  Hgb 7.4  WBC 14  Scheduled Meds: . amoxicillin-clavulanate  1 tablet Oral BID  . aspirin EC  325 mg Oral Daily  . ciprofloxacin  400 mg Intravenous Q12H  . feeding supplement (ENSURE ENLIVE)  237 mL Oral BID BM  . fluconazole (DIFLUCAN) IV  200 mg Intravenous Q24H  . FLUoxetine  20 mg Oral BID  . heparin  5,000 Units Subcutaneous Q8H  . insulin aspart  0-9 Units Subcutaneous Q4H  . insulin glargine  13 Units Subcutaneous QHS  . metoprolol tartrate  25 mg Oral BID  . potassium chloride  40 mEq Oral Once    . pravastatin  40 mg Oral QHS  . sodium chloride flush  3 mL Intravenous Q12H   Continuous Infusions:   Principal Problem:   Abscess and cellulitis Active Problems:   IDDM (insulin dependent diabetes mellitus) (HCC)   Normocytic anemia   UTI (urinary tract infection)   Anemia of chronic disease   Depression   Gram-negative bacteremia (HCC)   LOS: 6 days   Time spent 25 minutes  By signing my name below, I, [name], attest that this documentation has been prepared under the direction and in the presence of [name] P. Irene Limbo, MD. Electronically Signed: Adron [name], Scribe.  10/18/2015 12:35pm   I personally performed the services described in this documentation. All medical record entries made by the scribe were at my direction. I have reviewed the chart and agree that the record reflects my personal performance and is accurate and complete. Sophia Sacks, MD

## 2015-10-19 LAB — GLUCOSE, CAPILLARY
GLUCOSE-CAPILLARY: 74 mg/dL (ref 65–99)
GLUCOSE-CAPILLARY: 85 mg/dL (ref 65–99)
GLUCOSE-CAPILLARY: 240 mg/dL — AB (ref 65–99)
Glucose-Capillary: 104 mg/dL — ABNORMAL HIGH (ref 65–99)

## 2015-10-19 LAB — CULTURE, BLOOD (ROUTINE X 2)

## 2015-10-19 MED ORDER — HYDROCODONE-ACETAMINOPHEN 5-325 MG PO TABS: 1.0000 | ORAL_TABLET | Freq: Four times a day (QID) | ORAL | Status: DC | PRN

## 2015-10-19 MED ORDER — AMOXICILLIN-POT CLAVULANATE 875-125 MG PO TABS
1.0000 | ORAL_TABLET | Freq: Two times a day (BID) | ORAL | Status: DC
Start: 2015-10-19 — End: 2015-10-26

## 2015-10-19 NOTE — Progress Notes (Signed)
PROGRESS NOTE  [name] HQI:[id number] DOB: 1955/10/08 DOA: 10/12/2015 PCP: Louie BostonAPPER,[name], MD  Brief Narrative: 60 year old woman with diabetes mellitus, status post necrotizing fasciitis March 2017, status post acute renal failure April 2017, presented with abscess right axilla. Cesarean drainage in the emergency department was performed. Wound culture grew group B strep and patient was transitioned to Augmentin. Blood cultures positive for gram-negative rods, further identification pending. Likely secondary to UTI based on urine culture results.  Assessment/Plan: 1. Sepsis secondary to abscess right axilla. Resolved. 2. Right axilla abscess, group B strep. Continues to improve. Continue Augmentin. Gen. surgery, conservative management. 3. Gram-negative anaerobic bacteremia. Gram-negative organisms also seen on Gram stain from wound culture. Discussed with Dr. Cliffton AstersJohn Campbell infectious disease, presumably this represents transient bacteremia related to a polymicrobial infection in the axilla. He has recommended monotherapy with Augmentin, I concur. 4. Enterobacter UTI. Treated. 5. DM type 2. A1c from 09/2015 elevated at 9.7. Blood sugars remain stable 6. CKD stage 3. Cr 1.92. She has an appointment with Dr. Kathrene BongoGoldsborough next week. 7. Anemia of suspected chronic disease. Stable. 8. Status post acute renal failure 09/2015, thought to be secondary to ATN. 9. Status post necrotizing fasciitis 08/2015   Improving clinically. Continue Augmentin total 14 days.  Home today, resume home health for wound care, had physical therapy    Brendia Sacksaniel Goodrich, MD  [facility] Direct contact:  --Via amion app OR  --[email or web address]; password [email or web address] and click  7PM-7AM contact night coverage as above 10/19/2015, 4:36 PM  LOS: 7 days   Consultants:   General surgery  Wound care  Procedures:  I&D 5/04 in ED  Antimicrobials:   Vanc 5/04 >> 5/6  Zosyn 5/04 >> 5/08  Augmentin 5/08 >>  5/17  Cipro 5/08 >> 5/11  HPI/Subjective: Feels well today. Her right arm feels much improved. Denies any n/v. No trouble breathing. Reports decreased appetite.   Objective: Filed Vitals:   10/18/15 0700 10/18/15 1237 10/18/15 2049 10/19/15 0704  BP: 125/69 101/58 105/63 115/61  Pulse: 76 84 79 78  Temp: 98.2 F (36.8 C) 98 F (36.7 C) 98.3 F (36.8 C) 98.1 F (36.7 C)  TempSrc: Oral Oral Oral Oral  Resp: 18 20 20 20   Height:      Weight:      SpO2: 100% 100% 100% 100%    Intake/Output Summary (Last 24 hours) at 10/19/15 1636 Last data filed at 10/18/15 2322  Gross per 24 hour  Intake    300 ml  Output    300 ml  Net      0 ml     Filed Weights   10/12/15 1433 10/12/15 2042  Weight: 95.255 kg (210 lb) 87.091 kg (192 lb)    Exam:    Constitutional:  . Appears calm and comfortable Respiratory:  . CTA bilaterally, no w/r/r.  . Respiratory effort normal. No retractions or accessory muscle use Cardiovascular:  . RRR, no m/r/g Skin:  . Right axilla wound draining pus Psychiatric:  . judgement and insight appear normal . Mental status o Mood, affect appropriate  I have personally reviewed following labs and imaging studies:  Blood sugars stable  Scheduled Meds: . amoxicillin-clavulanate  1 tablet Oral BID  . aspirin EC  325 mg Oral Daily  . ciprofloxacin  400 mg Intravenous Q12H  . feeding supplement (ENSURE ENLIVE)  237 mL Oral BID BM  . fluconazole (DIFLUCAN) IV  200 mg Intravenous Q24H  . FLUoxetine  20 mg Oral BID  .  heparin  5,000 Units Subcutaneous Q8H  . insulin aspart  0-9 Units Subcutaneous Q4H  . insulin glargine  13 Units Subcutaneous QHS  . metoprolol tartrate  25 mg Oral BID  . pravastatin  40 mg Oral QHS  . sodium chloride flush  3 mL Intravenous Q12H   Continuous Infusions:   Principal Problem:   Abscess and cellulitis Active Problems:   IDDM (insulin dependent diabetes mellitus) (HCC)   Normocytic anemia   UTI (urinary tract  infection)   Anemia of chronic disease   Depression   Gram-negative bacteremia (HCC)   Acute cystitis   LOS: 7 days   I have thoroughly reviewed the above known which was created by Zadie Cleverly.  I personally performed the services described in this documentation. All medical record entries made by the scribe were at my direction. I have reviewed the chart and agree that the record reflects my personal performance and is accurate and complete. Brendia Sacks, MD

## 2015-10-19 NOTE — Care Management Note (Signed)
Case Management Note  Patient Details  Name: [name] MRN: [id number] Date of Birth: November 09, 1955  Expected Discharge Date:      10/19/2015            Expected Discharge Plan:  Home w Home Health Services  In-House Referral:  NA  Discharge planning Services  CM Consult  Post Acute Care Choice:  Home Health, Durable Medical Equipment Choice offered to:  Patient  DME Arranged:  Bedside commode DME Agency:  [facility]  [facility]  [facility]  Status of Service:  Completed, signed off  Medicare Important Message Given:    Date Medicare IM Given:    Medicare IM give by:    Date Additional Medicare IM Given:    Additional Medicare Important Message give by:     If discussed at Long Length of Stay Meetings, dates discussed:  10/19/2015  Additional Comments: Pt discharging home today with resumption of services through [facility]. Clydie BraunKaren, of [facility] is aware and HH resumption order has been faxed. Pt is aware HH has 48 hours to resume services. No further CM needs.   Malcolm Metrohildress, Janiah Devinney Demske, RN 10/19/2015, 3:26 PM

## 2015-10-19 NOTE — Progress Notes (Signed)
Patient with orders to be discharge home. Discharge instructions given, patient verbalized understanding. Prescriptions given. Dressing changed to right thigh and right axillary prior to discharge. IV removed. Patient stable. Patient left in private vehicle with friend.

## 2015-10-19 NOTE — Discharge Summary (Signed)
Physician Discharge Summary  [name] GEX:[id number] DOB: March 10, 1956 DOA: 10/12/2015  PCP: [name], MD  Admit date: 10/12/2015 Discharge date: 10/19/2015  Recommendations for Outpatient Follow-up:  1. Resolution of right axilla abscess 2. Home health RN, PT for axilla wound, also thigh wound present on admission 3. Anemia of chronic disease. Suggest periodic CBC as an outpatient.  Follow-up Information    Follow up with TAPPER,[name], MD. Schedule an appointment as soon as possible for a visit in 1 week.   Specialty:  Family Medicine   Contact information:   [address] Raeanne Gathers [place] [address] [phone]      Discharge Diagnoses:  1. Sepsis secondary to abscess right axilla 2. Right axilla abscess, group B streptococcus 3. Gram-negative rod anaerobic bacteremia 4. Enterobacter UTI 5. Diabetes mellitus type 2 6. Chronic kidney disease stage III 7. Anemia of chronic disease  Discharge Condition: improved Disposition: home  Diet recommendation: diabetic diet  Filed Weights   10/12/15 1433 10/12/15 2042  Weight: 95.255 kg (210 lb) 87.091 kg (192 lb)    History of present illness:  60 year old woman with diabetes mellitus, status post necrotizing fasciitis March 2017, status post acute renal failure April 2017, presented with abscess right axilla.  Hospital Course:  Incision and drainage was performed in the emergency department, wound culture grew group B streptococcus, one blood culture with gram-negative rod anaerobic bacteria, gram-negative organisms also seen on Gram stain of wound culture. Patient improved with supportive care and conservative management as recommended by general surgery. She'll complete a two-week course of Augmentin. Individual issues as below.  1. Sepsis secondary to abscess right axilla. Resolved. 2. Right axilla abscess, group B strep. Continues to improve. Continue Augmentin. Gen. surgery, conservative  management. 3. Gram-negative anaerobic bacteremia. Gram-negative organisms also seen on Gram stain from wound culture. Discussed with Dr. Cliffton Asters infectious disease, presumably this represents transient bacteremia related to a polymicrobial infection in the axilla. He has recommended monotherapy with Augmentin, I concur. 4. Enterobacter UTI. Treated. 5. DM type 2. A1c from 09/2015 elevated at 9.7. Blood sugars remain stable 6. CKD stage 3. Cr 1.92. She has an appointment with Dr. Kathrene Bongo next week. 7. Anemia of suspected chronic disease. Stable. 8. Status post acute renal failure 09/2015, thought to be secondary to ATN. 9. Status post necrotizing fasciitis 08/2015  Consultants:   General surgery  Wound care  Procedures:  I&D 5/04 in ED  Antimicrobials:   Vanc 5/04 >> 5/6  Zosyn 5/04 >> 5/08  Augmentin 5/08 >> 5/17  Cipro 5/08 >> 5/11  Discharge Instructions  Discharge Instructions    Diet Carb Modified    Complete by:  As directed      Discharge instructions    Complete by:  As directed   Call your physician or seek immediate medical attention for increased pain, drainage, fever or worsening of condition.     Increase activity slowly    Complete by:  As directed           Discharge Medication List as of 10/19/2015  2:35 PM    START taking these medications   Details  amoxicillin-clavulanate (AUGMENTIN) 875-125 MG tablet Take 1 tablet by mouth 2 (two) times daily., Starting 10/19/2015, Until Discontinued, Normal      CONTINUE these medications which have CHANGED   Details  HYDROcodone-acetaminophen (NORCO/VICODIN) 5-325 MG tablet Take 1 tablet by mouth every 6 (six) hours as needed for moderate pain., Starting 10/19/2015, Until Discontinued, Print  CONTINUE these medications which have NOT CHANGED   Details  albuterol (PROAIR HFA) 108 (90 Base) MCG/ACT inhaler Inhale 2 puffs into the lungs every 6 (six) hours as needed for wheezing or shortness of  breath., Until Discontinued, Historical Med    aspirin EC 325 MG tablet Take 325 mg by mouth daily., Until Discontinued, Historical Med    clobetasol cream (TEMOVATE) 0.05 % Apply topically 2 (two) times daily., Starting 09/21/2015, Until Discontinued, Normal    FLUoxetine (PROZAC) 20 MG capsule Take 20 mg by mouth 2 (two) times daily. , Starting 05/11/2015, Until Discontinued, Historical Med    insulin aspart (NOVOLOG FLEXPEN) 100 UNIT/ML FlexPen Inject 3 Units into the skin 3 (three) times daily with meals., Until Discontinued, Historical Med    insulin glargine (LANTUS) 100 UNIT/ML injection Inject 0.18 mLs (18 Units total) into the skin 2 (two) times daily., Starting 08/15/2015, Until Discontinued, No Print    metoprolol tartrate (LOPRESSOR) 25 MG tablet Take 1 tablet (25 mg total) by mouth 2 (two) times daily., Starting 09/21/2015, Until Discontinued, Normal    pravastatin (PRAVACHOL) 40 MG tablet Take 40 mg by mouth at bedtime. , Starting 05/11/2015, Until Discontinued, Historical Med    insulin aspart (NOVOLOG) 100 UNIT/ML injection Before each meal 3 times a day, 140-199 - 2 units, 200-250 - 4 units, 251-299 - 6 units,  300-349 - 8 units,  350 or above 10 units., No Print      STOP taking these medications     diphenhydrAMINE (BENADRYL) 25 mg capsule      oxyCODONE (OXY IR/ROXICODONE) 5 MG immediate release tablet        Allergies  Allergen Reactions  . Other     Per pt some type of mycin "some antibiotic sent me into renal failure."    The results of significant diagnostics from this hospitalization (including imaging, microbiology, ancillary and laboratory) are listed below for reference.    Significant Diagnostic Studies: No results found.  Microbiology: Recent Results (from the past 240 hour(s))  Culture, routine-abscess     Status: None   Collection Time: 10/12/15  4:30 PM  Result Value Ref Range Status   Specimen Description ABSCESS RIGHT AXILLA  Final   Special  Requests IMMUNE:COMPROMISED  Final   Gram Stain   Final    RARE WBC PRESENT, PREDOMINANTLY MONONUCLEAR NO SQUAMOUS EPITHELIAL CELLS SEEN ABUNDANT GRAM POSITIVE COCCI IN PAIRS IN CLUSTERS FEW GRAM NEGATIVE RODS Performed at [facility]    Culture   Final    MODERATE GROUP B STREP(S.AGALACTIAE)ISOLATED Note: Beta hemolytic streptococci are predictably susceptible to penicillin and other beta lactams. Susceptibility testing not routinely performed. Performed at [facility]    Report Status 10/14/2015 FINAL  Final  Culture, blood (routine x 2)     Status: None   Collection Time: 10/12/15  6:07 PM  Result Value Ref Range Status   Specimen Description BLOOD RIGHT ARM  Final   Special Requests BOTTLES DRAWN AEROBIC AND ANAEROBIC 6CC  Final   Culture NO GROWTH 5 DAYS  Final   Report Status 10/17/2015 FINAL  Final  Culture, Urine     Status: Abnormal   Collection Time: 10/12/15  6:20 PM  Result Value Ref Range Status   Specimen Description URINE, RANDOM  Final   Special Requests NONE  Final   Culture >=100,000 COLONIES/mL ENTEROBACTER AEROGENES (A)  Final   Report Status 10/15/2015 FINAL  Final   Organism ID, Bacteria ENTEROBACTER AEROGENES (A)  Final      Susceptibility   Enterobacter aerogenes - MIC*    CEFAZOLIN >=64 RESISTANT Resistant     CEFTRIAXONE 16 INTERMEDIATE Intermediate     CIPROFLOXACIN <=0.25 SENSITIVE Sensitive     GENTAMICIN <=1 SENSITIVE Sensitive     IMIPENEM 1 SENSITIVE Sensitive     NITROFURANTOIN 64 INTERMEDIATE Intermediate     TRIMETH/SULFA <=20 SENSITIVE Sensitive     PIP/TAZO >=128 RESISTANT Resistant     * >=100,000 COLONIES/mL ENTEROBACTER AEROGENES  Culture, blood (routine x 2)     Status: Abnormal   Collection Time: 10/12/15 10:23 PM  Result Value Ref Range Status   Specimen Description BLOOD RIGHT HAND  Final   Special Requests BOTTLES DRAWN AEROBIC AND ANAEROBIC 6CC  Final   Culture  Setup Time   Final    ANAEROBIC BOTTLE ONLY  GRAM NEGATIVE RODS CRITICAL RESULT CALLED TO, READ BACK BY AND VERIFIED WITH: TO JWOODIE(RN) BY TCLEVELAND 10/15/2015 2:57AM Organism ID to follow    Culture (A)  Final    ANAEROBIC GRAM NEGATIVE ROD BETA LACTAMASE NEGATIVE Performed at [facility]    Report Status 10/19/2015 FINAL  Final  Blood Culture ID Panel (Reflexed)     Status: None   Collection Time: 10/12/15 10:23 PM  Result Value Ref Range Status   Enterococcus species NOT DETECTED NOT DETECTED Final   Vancomycin resistance NOT DETECTED NOT DETECTED Final   Listeria monocytogenes NOT DETECTED NOT DETECTED Final   Staphylococcus species NOT DETECTED NOT DETECTED Final   Staphylococcus aureus NOT DETECTED NOT DETECTED Final   Methicillin resistance NOT DETECTED NOT DETECTED Final   Streptococcus species NOT DETECTED NOT DETECTED Final   Streptococcus agalactiae NOT DETECTED NOT DETECTED Final   Streptococcus pneumoniae NOT DETECTED NOT DETECTED Final   Streptococcus pyogenes NOT DETECTED NOT DETECTED Final   Acinetobacter baumannii NOT DETECTED NOT DETECTED Final   Enterobacteriaceae species NOT DETECTED NOT DETECTED Final   Enterobacter cloacae complex NOT DETECTED NOT DETECTED Final   Escherichia coli NOT DETECTED NOT DETECTED Final   Klebsiella oxytoca NOT DETECTED NOT DETECTED Final   Klebsiella pneumoniae NOT DETECTED NOT DETECTED Final   Proteus species NOT DETECTED NOT DETECTED Final   Serratia marcescens NOT DETECTED NOT DETECTED Final   Carbapenem resistance NOT DETECTED NOT DETECTED Final   Haemophilus influenzae NOT DETECTED NOT DETECTED Final   Neisseria meningitidis NOT DETECTED NOT DETECTED Final   Pseudomonas aeruginosa NOT DETECTED NOT DETECTED Final   Candida albicans NOT DETECTED NOT DETECTED Final   Candida glabrata NOT DETECTED NOT DETECTED Final   Candida krusei NOT DETECTED NOT DETECTED Final   Candida parapsilosis NOT DETECTED NOT DETECTED Final   Candida tropicalis NOT DETECTED NOT DETECTED  Final    Comment: Performed at [facility]     Labs: Basic Metabolic Panel:  Recent Labs Lab 10/14/15 0616 10/15/15 0624 10/16/15 0539 10/17/15 0909 10/18/15 0631  NA 132* 138 140 142 139  K 3.6 3.9 3.3* 3.2* 3.2*  CL 101 105 108 108 107  CO2 19* 22 22 23 23   GLUCOSE 246* 99 89 101* 114*  BUN 81* 79* 79* 77* 74*  CREATININE 1.76* 1.58* 1.45* 1.73* 1.92*  CALCIUM 8.1* 8.4* 8.3* 8.6* 8.6*   CBC:  Recent Labs Lab 10/14/15 0616 10/15/15 0624 10/16/15 0539 10/17/15 0909 10/18/15 0631  WBC 15.7* 16.8* 13.6* 10.6* 14.0*  HGB 8.3* 8.6* 7.7* 7.8* 7.4*  HCT 23.9* 26.2* 23.1* 24.2* 23.0*  MCV 93.4 92.3  92.4 93.1 93.5  PLT 397 409* 372 317 323   CBG:  Recent Labs Lab 10/18/15 2008 10/19/15 0006 10/19/15 0414 10/19/15 0744 10/19/15 1125  GLUCAP 100* 104* 74 85 240*    Principal Problem:   Abscess and cellulitis Active Problems:   IDDM (insulin dependent diabetes mellitus) (HCC)   Normocytic anemia   UTI (urinary tract infection)   Anemia of chronic disease   Depression   Gram-negative bacteremia (HCC)   Acute cystitis   Time coordinating discharge: 35 minutes  Signed:  Brendia Sacks, MD [facility] 10/19/2015, 4:43 PM

## 2015-10-19 NOTE — Progress Notes (Addendum)
Physical Therapy Treatment Patient Details Name: [name] MRN: [id number] DOB: 1956/05/24 Today's Date: 10/19/2015    History of Present Illness 60 yo F admitted with sepsis due to abcess of R axilla - s/p I&D on 10/12/15, UTI with yeast.  PMH: HTN, DM, necrotizing fascitis R LE.    PT Comments    Pt semirecumbent in bed, eager and willing to participate with therapy this morning.  No c/o pain today, just a little soreness Rt armpit, no pain scale given.  Min assistance with trunk rotation and moderate cueing to assist with handplacement with bed mobility.  Min cueing and assistance required with sit to stand transfer and min guard with gait training today.  Improved activity tolerance with ability to ambulate 40 feet with no rest break required though does demonstrate decreased cadence and decreased stride length with gait.  Pt left in chair with call bell within reach.  Nursing aides contacted to clean up pt and bed as had BM in bed.  No reports of pain through session.    Follow Up Recommendations        Equipment Recommendations       Recommendations for Other Services       Precautions / Restrictions Precautions Precautions: Fall Precaution Comments: sacral wound Restrictions Weight Bearing Restrictions: No    Mobility  Bed Mobility Overal bed mobility: Modified Independent Bed Mobility: Supine to Sit     Supine to sit: HOB elevated;Min guard     General bed mobility comments: Pt assisted with raising trunk  Transfers Overall transfer level: Needs assistance Equipment used: Rolling walker (2 wheeled) Transfers: Sit to/from Stand Sit to Stand: Min assist         General transfer comment: Pt required extensive education on proper sit<>stand transfer technique including hand placement, and weight shifting.   Ambulation/Gait Ambulation/Gait assistance: Min guard Ambulation Distance (Feet): 40 Feet Assistive device: Rolling walker (2 wheeled) Gait  Pattern/deviations: Step-through pattern;Decreased stride length Gait velocity: decreased Gait velocity interpretation: Below normal speed for age/gender General Gait Details: Improved activity tolerance with ability to complete 40 feet with no seated rest required, extreme decrease in cadence   Stairs            Wheelchair Mobility    Modified Rankin (Stroke Patients Only)       Balance                                    Cognition Arousal/Alertness: Awake/alert Behavior During Therapy: WFL for tasks assessed/performed Overall Cognitive Status: Within Functional Limits for tasks assessed                      Exercises      General Comments        Pertinent Vitals/Pain Pain Assessment: No/denies pain    Home Living                      Prior Function            PT Goals (current goals can now be found in the care plan section) Acute Rehab PT Goals Patient Stated Goal: To go home PT Goal Formulation: With patient Progress towards PT goals: Progressing toward goals    Frequency       PT Plan Current plan remains appropriate    Co-evaluation  End of Session Equipment Utilized During Treatment: Gait belt Activity Tolerance: Patient tolerated treatment well Patient left: in chair;with call bell/phone within reach;with chair alarm set;with nursing/sitter in room     Time: [id number] PT Time Calculation (min) (ACUTE ONLY): 15 min  Charges:  $Gait Training: 8-22 mins $Therapeutic Activity: 8-22 mins                    G Codes:     Becky SaxCasey Zeyna Mkrtchyan, LPTA; CBIS [phone]  Juel BurrowCockerham, Taequan Stockhausen Jo 10/19/2015, 9:43 AM

## 2015-10-22 ENCOUNTER — Encounter (HOSPITAL_COMMUNITY): Payer: Self-pay | Admitting: Emergency Medicine

## 2015-10-22 ENCOUNTER — Inpatient Hospital Stay (HOSPITAL_COMMUNITY)
Admission: EM | Admit: 2015-10-22 | Discharge: 2015-10-26 | DRG: 571 | Disposition: A | Discharge status: Home Health | Payer: BLUE CROSS/BLUE SHIELD | Attending: Internal Medicine | Admitting: Internal Medicine

## 2015-10-22 DIAGNOSIS — D638 Anemia in other chronic diseases classified elsewhere: Secondary | ICD-10-CM | POA: Diagnosis present

## 2015-10-22 DIAGNOSIS — N183 Chronic kidney disease, stage 3 unspecified: Secondary | ICD-10-CM

## 2015-10-22 DIAGNOSIS — L02411 Cutaneous abscess of right axilla: Secondary | ICD-10-CM | POA: Diagnosis present

## 2015-10-22 DIAGNOSIS — I129 Hypertensive chronic kidney disease with stage 1 through stage 4 chronic kidney disease, or unspecified chronic kidney disease: Secondary | ICD-10-CM | POA: Diagnosis present

## 2015-10-22 DIAGNOSIS — D649 Anemia, unspecified: Secondary | ICD-10-CM

## 2015-10-22 DIAGNOSIS — N289 Disorder of kidney and ureter, unspecified: Secondary | ICD-10-CM

## 2015-10-22 DIAGNOSIS — IMO0001 Reserved for inherently not codable concepts without codable children: Secondary | ICD-10-CM

## 2015-10-22 DIAGNOSIS — I959 Hypotension, unspecified: Secondary | ICD-10-CM | POA: Diagnosis present

## 2015-10-22 DIAGNOSIS — M79621 Pain in right upper arm: Secondary | ICD-10-CM | POA: Diagnosis not present

## 2015-10-22 DIAGNOSIS — E1122 Type 2 diabetes mellitus with diabetic chronic kidney disease: Secondary | ICD-10-CM | POA: Diagnosis present

## 2015-10-22 DIAGNOSIS — F17210 Nicotine dependence, cigarettes, uncomplicated: Secondary | ICD-10-CM | POA: Diagnosis present

## 2015-10-22 DIAGNOSIS — E1165 Type 2 diabetes mellitus with hyperglycemia: Secondary | ICD-10-CM | POA: Diagnosis present

## 2015-10-22 DIAGNOSIS — E1152 Type 2 diabetes mellitus with diabetic peripheral angiopathy with gangrene: Secondary | ICD-10-CM | POA: Diagnosis present

## 2015-10-22 DIAGNOSIS — Z6834 Body mass index (BMI) 34.0-34.9, adult: Secondary | ICD-10-CM

## 2015-10-22 DIAGNOSIS — Z794 Long term (current) use of insulin: Secondary | ICD-10-CM

## 2015-10-22 DIAGNOSIS — E119 Type 2 diabetes mellitus without complications: Secondary | ICD-10-CM

## 2015-10-22 LAB — CBG MONITORING, ED: Glucose-Capillary: 303 mg/dL — ABNORMAL HIGH (ref 65–99)

## 2015-10-22 MED ORDER — MORPHINE SULFATE (PF) 4 MG/ML IV SOLN
4.0000 mg | Freq: Once | INTRAVENOUS | Status: AC
  Administered 2015-10-23: 4 mg via INTRAVENOUS
  Filled 2015-10-22: qty 1

## 2015-10-22 NOTE — ED Notes (Signed)
Pt reports that home health RN has evaluated as well as her home caregiver has packed. The wound is draining and patient reports that she is here because of the pain to the wound. Reports that pain as 8/10 and that the pain is intermittent

## 2015-10-22 NOTE — ED Notes (Signed)
Patient states she has abscess to right axilla area. States she had I&D performed here on area but pain has worsened since this morning.

## 2015-10-22 NOTE — ED Notes (Addendum)
Pt reports that she is taking oxycodone for her pain

## 2015-10-22 NOTE — ED Provider Notes (Signed)
CSN: [id number]     Arrival date & time 10/22/15  2127 History  By signing my name below, I, Iona BeardChristian Pulliam, attest that this documentation has been prepared under the direction and in the presence of Dione Boozeavid Regino Fournet, MD.   Electronically Signed: Iona Beardhristian Pulliam, ED Scribe. 10/22/2015. 11:54 PM   Chief Complaint  Patient presents with  . Abscess   The history is provided by the patient. No language interpreter was used.   HPI Comments: [name] is a 60 y.o. female with PMHx of DM and HTN who presents to the Emergency Department complaining of gradual onset, abscess to right axilla, ongoing one day ago. Pt reports associated stabbing, 5/10 pain to the area and drainage. No other associated symptoms noted. Pt takes oxycodone at home with relief to her pain. No other worsening or alleviating factors noted. Pt denies fever, chills, diaphoresis, nausea, vomiting, or any other pertinent symptoms.   Past Medical History  Diagnosis Date  . Diabetes mellitus without complication (HCC)   . Hypertension    Past Surgical History  Procedure Laterality Date  . Incision and drainage abscess Right 08/08/2015    Procedure: Irrigation and Debridement of Right Lower Ext.;  Surgeon: Axel FillerArmando Ramirez, MD;  Location: [facility] OR;  Service: General;  Laterality: Right;  . Incision and drainage Right 08/09/2015    Procedure: INCISION AND DRAINAGE;  Surgeon: Rodman PickleLuke Aaron Kinsinger, MD;  [facility];  Service: General;  Laterality: Right;   History reviewed. No pertinent family history. Social History  Substance Use Topics  . Smoking status: Current Some Day Smoker -- 0.50 packs/day    Types: Cigarettes  . Smokeless tobacco: None  . Alcohol Use: Yes     Comment: occ.   OB History    No data available     Review of Systems  Constitutional: Negative for fever, chills and diaphoresis.  Gastrointestinal: Negative for nausea and vomiting.  Skin:       Abscess noted to right axilla.   All other  systems reviewed and are negative.   Allergies  Other  Home Medications   Prior to Admission medications   Medication Sig Start Date End Date Taking? Authorizing Provider  albuterol (PROAIR HFA) 108 (90 Base) MCG/ACT inhaler Inhale 2 puffs into the lungs every 6 (six) hours as needed for wheezing or shortness of breath.   Yes Historical Provider, MD  amoxicillin-clavulanate (AUGMENTIN) 875-125 MG tablet Take 1 tablet by mouth 2 (two) times daily. 10/19/15  Yes Standley Brookinganiel P Goodrich, MD  aspirin EC 325 MG tablet Take 325 mg by mouth daily.   Yes Historical Provider, MD  clobetasol cream (TEMOVATE) 0.05 % Apply topically 2 (two) times daily. 09/21/15  Yes Leroy SeaPrashant K Singh, MD  FLUoxetine (PROZAC) 20 MG capsule Take 20 mg by mouth 2 (two) times daily.  05/11/15  Yes Historical Provider, MD  insulin aspart (NOVOLOG FLEXPEN) 100 UNIT/ML FlexPen Inject 3 Units into the skin 3 (three) times daily with meals.   Yes Historical Provider, MD  insulin glargine (LANTUS) 100 UNIT/ML injection Inject 0.18 mLs (18 Units total) into the skin 2 (two) times daily. Patient taking differently: Inject 18 Units into the skin at bedtime.  08/15/15  Yes Leroy SeaPrashant K Singh, MD  metoprolol tartrate (LOPRESSOR) 25 MG tablet Take 1 tablet (25 mg total) by mouth 2 (two) times daily. 09/21/15  Yes Leroy SeaPrashant K Singh, MD  oxyCODONE (OXY IR/ROXICODONE) 5 MG immediate release tablet Take 5-10 mg by mouth every 6 (six) hours  as needed. For pain 10/05/15  Yes Historical Provider, MD  pravastatin (PRAVACHOL) 40 MG tablet Take 40 mg by mouth at bedtime.  05/11/15  Yes Historical Provider, MD  HYDROcodone-acetaminophen (NORCO/VICODIN) 5-325 MG tablet Take 1 tablet by mouth every 6 (six) hours as needed for moderate pain. Patient not taking: Reported on 10/22/2015 10/19/15   Standley Brooking, MD  insulin aspart (NOVOLOG) 100 UNIT/ML injection Before each meal 3 times a day, 140-199 - 2 units, 200-250 - 4 units, 251-299 - 6 units,  300-349 - 8 units,   350 or above 10 units. Patient not taking: Reported on 09/09/2015 08/15/15   Leroy Sea, MD   BP 103/44 mmHg  Pulse 112  Temp(Src) 99.1 F (37.3 C) (Oral)  Resp 16  Ht 5\' 3"  (1.6 m)  Wt 192 lb (87.091 kg)  BMI 34.02 kg/m2  SpO2 100% Physical Exam  Constitutional: She is oriented to person, place, and time. She appears well-developed and well-nourished. No distress.  HENT:  Head: Normocephalic and atraumatic.  Eyes: EOM are normal. Pupils are equal, round, and reactive to light.  Neck: Normal range of motion. Neck supple. No JVD present.  Cardiovascular: Normal rate and regular rhythm.   Murmur heard. 3/6 systolic injection murmur to left sternal border.    Pulmonary/Chest: Effort normal and breath sounds normal. She has no wheezes. She has no rales. She exhibits no tenderness.  Several draining wounds from right axilla with mild fluctuance. No erythema, warmth, or induration.   Abdominal: Soft. Bowel sounds are normal. She exhibits no distension and no mass. There is no tenderness.  Musculoskeletal: Normal range of motion. She exhibits no edema.  Lymphadenopathy:    She has no cervical adenopathy.  Neurological: She is alert and oriented to person, place, and time. No cranial nerve deficit. She exhibits normal muscle tone. Coordination normal.  Skin: Skin is warm and dry. No rash noted.  Psychiatric: She has a normal mood and affect. Her behavior is normal. Judgment and thought content normal.  Nursing note and vitals reviewed.   ED Course  Procedures (including critical care time) DIAGNOSTIC STUDIES: Oxygen Saturation is 100% on RA, normal by my interpretation.    COORDINATION OF CARE: 11:54 PM Discussed treatment plan which includes CBG monitoring, CMP, and CBC with differential with pt at bedside and pt agreed to plan.  Labs Review Results for orders placed or performed during the hospital encounter of 10/22/15  Basic metabolic panel  Result Value Ref Range   Sodium  135 135 - 145 mmol/L   Potassium 3.6 3.5 - 5.1 mmol/L   Chloride 107 101 - 111 mmol/L   CO2 21 (L) 22 - 32 mmol/L   Glucose, Bld 295 (H) 65 - 99 mg/dL   BUN 56 (H) 6 - 20 mg/dL   Creatinine, Ser 1.47 (H) 0.44 - 1.00 mg/dL   Calcium 8.3 (L) 8.9 - 10.3 mg/dL   GFR calc non Af Amer 30 (L) >60 mL/min   GFR calc Af Amer 35 (L) >60 mL/min   Anion gap 7 5 - 15  CBC with Differential  Result Value Ref Range   WBC 11.2 (H) 4.0 - 10.5 K/uL   RBC 2.26 (L) 3.87 - 5.11 MIL/uL   Hemoglobin 7.0 (L) 12.0 - 15.0 g/dL   HCT 82.9 (L) 56.2 - 13.0 %   MCV 92.5 78.0 - 100.0 fL   MCH 31.0 26.0 - 34.0 pg   MCHC 33.5 30.0 - 36.0 g/dL  RDW 16.1 (H) 11.5 - 15.5 %   Platelets 353 150 - 400 K/uL   Neutrophils Relative % 80 %   Neutro Abs 8.9 (H) 1.7 - 7.7 K/uL   Lymphocytes Relative 12 %   Lymphs Abs 1.4 0.7 - 4.0 K/uL   Monocytes Relative 8 %   Monocytes Absolute 0.9 0.1 - 1.0 K/uL   Eosinophils Relative 0 %   Eosinophils Absolute 0.0 0.0 - 0.7 K/uL   Basophils Relative 0 %   Basophils Absolute 0.0 0.0 - 0.1 K/uL  CBG monitoring, ED  Result Value Ref Range   Glucose-Capillary 303 (H) 65 - 99 mg/dL    MDM   Final diagnoses:  Abscess of axilla, right  Renal insufficiency  Normocytic anemia  IDDM (insulin dependent diabetes mellitus) (HCC)   Abscess of the right axilla. Old records are reviewed and when she was hospitalized recently, abscess was noted to be present and draining and no further intervention was felt to be necessary. However, she currently shows evidence of increased production of purulent material. Even though she has ongoing drainage, I feel that this will need more aggressive intervention. Screening labs are obtained showing stable anemia and stable renal insufficiency. She has mild hyperglycemia with no evidence of ketoacidosis. She is started on vancomycin. Case is discussed with Dr. Lovell Sheehan [facility] who agrees to admit the patient under observation status for the  purpose of obtaining surgical consultation for consideration for possible more definitive drainage procedure.   I personally performed the services described in this documentation, which was scribed in my presence. The recorded information has been reviewed and is accurate.      Dione Booze, MD 10/23/15 0330

## 2015-10-22 NOTE — ED Notes (Signed)
Family member at bedside.

## 2015-10-22 NOTE — ED Notes (Signed)
Dr Preston FleetingGlick in to assess patient

## 2015-10-23 DIAGNOSIS — Z6834 Body mass index (BMI) 34.0-34.9, adult: Secondary | ICD-10-CM | POA: Diagnosis not present

## 2015-10-23 DIAGNOSIS — I959 Hypotension, unspecified: Secondary | ICD-10-CM | POA: Diagnosis not present

## 2015-10-23 DIAGNOSIS — D638 Anemia in other chronic diseases classified elsewhere: Secondary | ICD-10-CM | POA: Diagnosis present

## 2015-10-23 DIAGNOSIS — I129 Hypertensive chronic kidney disease with stage 1 through stage 4 chronic kidney disease, or unspecified chronic kidney disease: Secondary | ICD-10-CM | POA: Diagnosis present

## 2015-10-23 DIAGNOSIS — N183 Chronic kidney disease, stage 3 (moderate): Secondary | ICD-10-CM | POA: Diagnosis present

## 2015-10-23 DIAGNOSIS — E119 Type 2 diabetes mellitus without complications: Secondary | ICD-10-CM | POA: Diagnosis not present

## 2015-10-23 DIAGNOSIS — Z794 Long term (current) use of insulin: Secondary | ICD-10-CM | POA: Diagnosis not present

## 2015-10-23 DIAGNOSIS — F17210 Nicotine dependence, cigarettes, uncomplicated: Secondary | ICD-10-CM | POA: Diagnosis present

## 2015-10-23 DIAGNOSIS — E1165 Type 2 diabetes mellitus with hyperglycemia: Secondary | ICD-10-CM | POA: Diagnosis present

## 2015-10-23 DIAGNOSIS — M79621 Pain in right upper arm: Secondary | ICD-10-CM | POA: Diagnosis present

## 2015-10-23 DIAGNOSIS — E1122 Type 2 diabetes mellitus with diabetic chronic kidney disease: Secondary | ICD-10-CM | POA: Diagnosis present

## 2015-10-23 DIAGNOSIS — L02411 Cutaneous abscess of right axilla: Secondary | ICD-10-CM | POA: Diagnosis present

## 2015-10-23 DIAGNOSIS — E1152 Type 2 diabetes mellitus with diabetic peripheral angiopathy with gangrene: Secondary | ICD-10-CM | POA: Diagnosis present

## 2015-10-23 LAB — BASIC METABOLIC PANEL
ANION GAP: 7 (ref 5–15)
ANION GAP: 7 (ref 5–15)
BUN: 56 mg/dL — ABNORMAL HIGH (ref 6–20)
BUN: 55 mg/dL — ABNORMAL HIGH (ref 6–20)
CALCIUM: 8.1 mg/dL — AB (ref 8.9–10.3)
CALCIUM: 8.3 mg/dL — AB (ref 8.9–10.3)
CO2: 21 mmol/L — ABNORMAL LOW (ref 22–32)
CO2: 21 mmol/L — AB (ref 22–32)
Chloride: 107 mmol/L (ref 101–111)
Chloride: 106 mmol/L (ref 101–111)
Creatinine, Ser: 1.76 mg/dL — ABNORMAL HIGH (ref 0.44–1.00)
Creatinine, Ser: 1.89 mg/dL — ABNORMAL HIGH (ref 0.44–1.00)
GFR calc Af Amer: 32 mL/min — ABNORMAL LOW (ref >60)
GFR calc non Af Amer: 28 mL/min — ABNORMAL LOW (ref >60)
GFR, EST AFRICAN AMERICAN: 35 mL/min — AB (ref >60)
GFR, EST NON AFRICAN AMERICAN: 30 mL/min — AB (ref >60)
GLUCOSE: 309 mg/dL — AB (ref 65–99)
Glucose, Bld: 295 mg/dL — ABNORMAL HIGH (ref 65–99)
POTASSIUM: 3.6 mmol/L (ref 3.5–5.1)
Potassium: 3.6 mmol/L (ref 3.5–5.1)
Sodium: 135 mmol/L (ref 135–145)
Sodium: 134 mmol/L — ABNORMAL LOW (ref 135–145)

## 2015-10-23 LAB — GLUCOSE, CAPILLARY
GLUCOSE-CAPILLARY: 113 mg/dL — AB (ref 65–99)
Glucose-Capillary: 317 mg/dL — ABNORMAL HIGH (ref 65–99)
Glucose-Capillary: 314 mg/dL — ABNORMAL HIGH (ref 65–99)
Glucose-Capillary: 208 mg/dL — ABNORMAL HIGH (ref 65–99)
Glucose-Capillary: 155 mg/dL — ABNORMAL HIGH (ref 65–99)

## 2015-10-23 LAB — CBC
HEMATOCRIT: 21.3 % — AB (ref 36.0–46.0)
HEMOGLOBIN: 6.9 g/dL — AB (ref 12.0–15.0)
MCH: 29.9 pg (ref 26.0–34.0)
MCHC: 32.4 g/dL (ref 30.0–36.0)
MCV: 92.2 fL (ref 78.0–100.0)
Platelets: 351 10*3/uL (ref 150–400)
RBC: 2.31 MIL/uL — ABNORMAL LOW (ref 3.87–5.11)
RDW: 16.2 % — ABNORMAL HIGH (ref 11.5–15.5)
WBC: 10.8 10*3/uL — ABNORMAL HIGH (ref 4.0–10.5)

## 2015-10-23 LAB — CBC WITH DIFFERENTIAL/PLATELET
BASOS ABS: 0 10*3/uL (ref 0.0–0.1)
BASOS PCT: 0 %
EOS ABS: 0 10*3/uL (ref 0.0–0.7)
Eosinophils Relative: 0 %
HEMATOCRIT: 20.9 % — AB (ref 36.0–46.0)
HEMOGLOBIN: 7 g/dL — AB (ref 12.0–15.0)
Lymphocytes Relative: 12 %
Lymphs Abs: 1.4 10*3/uL (ref 0.7–4.0)
MCH: 31 pg (ref 26.0–34.0)
MCHC: 33.5 g/dL (ref 30.0–36.0)
MCV: 92.5 fL (ref 78.0–100.0)
MONO ABS: 0.9 10*3/uL (ref 0.1–1.0)
Monocytes Relative: 8 %
NEUTROS ABS: 8.9 10*3/uL — AB (ref 1.7–7.7)
NEUTROS PCT: 80 %
Platelets: 353 10*3/uL (ref 150–400)
RBC: 2.26 MIL/uL — ABNORMAL LOW (ref 3.87–5.11)
RDW: 16.1 % — AB (ref 11.5–15.5)
WBC: 11.2 10*3/uL — ABNORMAL HIGH (ref 4.0–10.5)

## 2015-10-23 LAB — PREPARE RBC (CROSSMATCH)

## 2015-10-23 LAB — ABO/RH: ABO/RH(D): A POS

## 2015-10-23 MED ORDER — INSULIN GLARGINE 100 UNIT/ML ~~LOC~~ SOLN
9.0000 [IU] | Freq: Every day | SUBCUTANEOUS | Status: DC
  Administered 2015-10-23: 9 [IU] via SUBCUTANEOUS
  Filled 2015-10-23 (×2): qty 0.09

## 2015-10-23 MED ORDER — INSULIN GLARGINE 100 UNIT/ML ~~LOC~~ SOLN
18.0000 [IU] | Freq: Every day | SUBCUTANEOUS | Status: DC
  Filled 2015-10-23: qty 0.18

## 2015-10-23 MED ORDER — SODIUM CHLORIDE 0.9 % IV SOLN
3.0000 g | Freq: Three times a day (TID) | INTRAVENOUS | Status: DC
  Administered 2015-10-23 – 2015-10-26 (×9): 3 g via INTRAVENOUS
  Filled 2015-10-23 (×13): qty 3

## 2015-10-23 MED ORDER — ONDANSETRON HCL 4 MG/2ML IJ SOLN
4.0000 mg | Freq: Four times a day (QID) | INTRAMUSCULAR | Status: DC | PRN
  Administered 2015-10-24: 4 mg via INTRAVENOUS
  Filled 2015-10-23: qty 2

## 2015-10-23 MED ORDER — INSULIN ASPART 100 UNIT/ML ~~LOC~~ SOLN
0.0000 [IU] | Freq: Every day | SUBCUTANEOUS | Status: DC
  Administered 2015-10-24: 2 [IU] via SUBCUTANEOUS

## 2015-10-23 MED ORDER — PRAVASTATIN SODIUM 40 MG PO TABS
40.0000 mg | ORAL_TABLET | Freq: Every day | ORAL | Status: DC
  Administered 2015-10-23 – 2015-10-25 (×3): 40 mg via ORAL
  Filled 2015-10-23 (×3): qty 1

## 2015-10-23 MED ORDER — ADULT MULTIVITAMIN W/MINERALS CH
1.0000 | ORAL_TABLET | Freq: Every day | ORAL | Status: DC
  Administered 2015-10-23 – 2015-10-26 (×3): 1 via ORAL
  Filled 2015-10-23 (×3): qty 1

## 2015-10-23 MED ORDER — ONDANSETRON HCL 4 MG PO TABS: 4.0000 mg | ORAL_TABLET | Freq: Four times a day (QID) | ORAL | Status: DC | PRN

## 2015-10-23 MED ORDER — VANCOMYCIN HCL IN DEXTROSE 1-5 GM/200ML-% IV SOLN
1000.0000 mg | Freq: Once | INTRAVENOUS | Status: AC
  Administered 2015-10-23: 1000 mg via INTRAVENOUS
  Filled 2015-10-23: qty 200

## 2015-10-23 MED ORDER — SODIUM CHLORIDE 0.9 % IV SOLN: Freq: Once | INTRAVENOUS | Status: AC

## 2015-10-23 MED ORDER — AMOXICILLIN-POT CLAVULANATE 875-125 MG PO TABS
1.0000 | ORAL_TABLET | Freq: Two times a day (BID) | ORAL | Status: DC
  Administered 2015-10-23: 1 via ORAL
  Filled 2015-10-23: qty 1

## 2015-10-23 MED ORDER — OXYCODONE HCL 5 MG PO TABS
10.0000 mg | ORAL_TABLET | ORAL | Status: DC | PRN
  Administered 2015-10-24 – 2015-10-26 (×4): 10 mg via ORAL
  Filled 2015-10-23 (×4): qty 2

## 2015-10-23 MED ORDER — HYDROMORPHONE HCL 1 MG/ML IJ SOLN
0.5000 mg | INTRAMUSCULAR | Status: DC | PRN
  Administered 2015-10-23 – 2015-10-25 (×4): 1 mg via INTRAVENOUS
  Filled 2015-10-23 (×4): qty 1

## 2015-10-23 MED ORDER — ENSURE ENLIVE PO LIQD
237.0000 mL | Freq: Two times a day (BID) | ORAL | Status: DC
  Administered 2015-10-23 – 2015-10-26 (×4): 237 mL via ORAL

## 2015-10-23 MED ORDER — ACETAMINOPHEN 325 MG PO TABS
650.0000 mg | ORAL_TABLET | Freq: Four times a day (QID) | ORAL | Status: DC | PRN
Start: 2015-10-23 — End: 2015-10-26

## 2015-10-23 MED ORDER — ACETAMINOPHEN 650 MG RE SUPP: 650.0000 mg | Freq: Four times a day (QID) | RECTAL | Status: DC | PRN

## 2015-10-23 MED ORDER — INSULIN ASPART 100 UNIT/ML ~~LOC~~ SOLN
0.0000 [IU] | Freq: Three times a day (TID) | SUBCUTANEOUS | Status: DC
  Administered 2015-10-23: 7 [IU] via SUBCUTANEOUS
  Administered 2015-10-23: 3 [IU] via SUBCUTANEOUS
  Administered 2015-10-24 (×3): 5 [IU] via SUBCUTANEOUS

## 2015-10-23 MED ORDER — VANCOMYCIN HCL IN DEXTROSE 1-5 GM/200ML-% IV SOLN: 1000.0000 mg | INTRAVENOUS | Status: DC

## 2015-10-23 MED ORDER — SODIUM CHLORIDE 0.9 % IV SOLN
INTRAVENOUS | Status: DC
  Administered 2015-10-23 (×2): via INTRAVENOUS

## 2015-10-23 NOTE — ED Notes (Signed)
Pt reports pain relief.

## 2015-10-23 NOTE — ED Notes (Signed)
Axillary wound cleaned and dressed with 1 pack 4x4s, abd pad and Transpore- wound continues to seep serosanguinous fluid that has a foul smell. Dressing to wound on R thigh intact, there is a recently healed small wound to the L upper outer thigh

## 2015-10-23 NOTE — Progress Notes (Addendum)
Initial Nutrition Assessment  DOCUMENTATION CODES:   Obesity unspecified  INTERVENTION:  Ensure Enlive po BID, each supplement provides 350 kcal and 20 grams of protein   Add Multivitamin daily  NUTRITION DIAGNOSIS:  Increased protein and energy needs related to wound healing and inflammatory process AEB est needs and skin assessment  GOAL:   Patient will meet greater than or equal to 90% of their needs MONITOR:   Supplement acceptance, PO intake, Skin, Labs, Weight trends  REASON FOR ASSESSMENT:   Malnutrition Screening Tool    ASSESSMENT:   60 y.o. female with a history of DM2, and HTN who presents to the Ed with complaints of worsening of an abscess of her Right underarm area. She reports increased pain and purulent drainage form the area. She reports subjective fevers and chills for the past 2-3 days. She was hospitalized on 10/12/2015 and had and I+D of the Right Axilla performed by Dr Franky MachoMark Jenkins and was prescribed Augmentin BID on discharge on 10/19/2015.  Pt says her appetite has been very poor. Pt says she has not been eating well since February. Her weight is down 3.7% in 10 days and 7% in 1 month which is significant.  Hyperglcemia and inflammatory process potential factors in her diminished appetite and unplanned weight loss. She has been drinking Ensure 1-2 daily and prefers chocolate.  NFPE: pt has areas of  mild muscle wasting, ample fat mass and no edema.  Recent Labs Lab 10/18/15 0631 10/23/15 0012 10/23/15 0650  NA 139 135 134*  K 3.2* 3.6 3.6  CL 107 107 106  CO2 23 21* 21*  BUN 74* 56* 55*  CREATININE 1.92* 1.76* 1.89*  CALCIUM 8.6* 8.3* 8.1*  GLUCOSE 114* 295* 309*   CBG's 317, 314, 208 today. Pt diabetes is poorly controlled.   Her last Hemoglobin A1C- 9.8% on 08/07/25 and 9.7% on 09/09/15.  Meds: reviewed.  Diet Order:  Diet heart healthy/carb modified Room service appropriate?: Yes; Fluid consistency:: Thin  Skin:   right axilla open  area with drainage and wound also to right thigh  Last BM:   5/14   Height:   Ht Readings from Last 1 Encounters:  10/23/15 5\' 3"  (1.6 m)    Weight:   Wt Readings from Last 1 Encounters:  10/23/15 185 lb 1 oz (83.944 kg)    Ideal Body Weight:  52.2 kg  BMI:  Body mass index is 32.79 kg/(m^2).  Estimated Nutritional Needs:   Kcal:   1658  Protein:  100-104 gr  Fluid:  >1500 ml daily  EDUCATION NEEDS: none at this time. When pt is feeling better will review DM diet guidelines.  Royann ShiversLynn Jovi Alvizo MS,RD,CSG,LDN Office: [phone] Pager: [phone]

## 2015-10-23 NOTE — Progress Notes (Addendum)
Pharmacy Antibiotic Note  [name] is a 60 y.o. female admitted on 10/22/2015 with cellulitis.  Pharmacy has been consulted for Vancomycin dosing.  Plan: Vancomycin 1gm IV every 24 hours.  Goal trough 10-15 mcg/mL.  Height: 5\' 3"  (160 cm) Weight: 185 lb 1 oz (83.944 kg) IBW/kg (Calculated) : 52.4  Temp (24hrs), Avg:99.3 F (37.4 C), Min:98.9 F (37.2 C), Max:100 F (37.8 C)   Recent Labs Lab 10/17/15 0909 10/18/15 0631 10/23/15 0012 10/23/15 0650  WBC 10.6* 14.0* 11.2* 10.8*  CREATININE 1.73* 1.92* 1.76* 1.89*    Estimated Creatinine Clearance: 32.5 mL/min (by C-G formula based on Cr of 1.89).    Allergies  Allergen Reactions  . Other     Per pt some type of mycin "some antibiotic sent me into renal failure."    Antimicrobials this admission: Vancomycin 5/15 >>  Augmentin 5/15 >>   Dose adjustments this admission:   Microbiology results: None done this admission 10/12/2015 wound culture: Grp B strep.  10/12/2015 urine cx: Enterobacter aer.  S- Cipro, septra I-ceftriaxone R- zosyn 10/12/2015 Blood cx: 1 of 2 +Anaerobic cx GNR.   Thank you for allowing pharmacy to be a part of this patient's care. Elder CyphersLorie Jessicaann Overbaugh, BS Pharm D, [place] Clinical Pharmacist Pager [phone] 10/23/2015 8:46 AM

## 2015-10-23 NOTE — Progress Notes (Signed)
PROGRESS NOTE  [name] ZOX:[id number] DOB: 14-Apr-1956 DOA: 10/22/2015 PCP: Louie BostonAPPER,[name], MD  Brief Narrative: [age] yof with recent discharge hospitalization due to abscess or right axilla, presented with complaints of increased pain and purulent drainage in the abscess area. Last hospitalization I&D performed by Dr. Lovell SheehanJenkins, general surgery, and she was prescribed Augmentin BID on discharge. She has been referred for admission.  Assessment/Plan: 1. Group B strep, presumed polymicrobial abscess right axilla with associated gram-negative rod anaerobic organism bacteremia, present on admission, on Augmentin as an outpatient. Worsening despite antibiotic therapy, may require surgery. 2. Anemia of acute illness superimposed on anemia of chronic disease. Hemoglobin slightly lower. In preparation for surgery plan transfusion 2 units packed red blood cells. Patient consents. 3. DM type 2. Stable. 4. Chronic kidney disease stage III. Stable. She has an appointment with Dr. Kathrene BongoGoldsborough next week 5. Obesity 6. Status post acute renal failure 09/2015, thought to be secondary to ATN. 7. Status post necrotizing fasciitis 08/2015   Change to Unasyn based on previous culture data. Possible old our next 24 hours.  Transfuse 2 units packed red blood cells in preparation for surgery given anemia  DVT prophylaxis: SCDs Code Status: Full Family Communication: Son bedside  Disposition Plan: Anticipate discharge in 1-2 days.   Brendia Sacksaniel Joal Eakle, MD  [facility] Direct contact:  --Via amion app OR  --[email or web address]; password [email or web address] and click  7PM-7AM contact night coverage as above 10/23/2015, 8:27 AM    Consultants:  General surgery  Procedures:  None  Antimicrobials:  Unasyn 5/15 >>  HPI/Subjective: Site became more painful. Intermittent draining noted. Otherwise has been feeling well.   Objective: Filed Vitals:   10/23/15 0200 10/23/15 0316 10/23/15 0420 10/23/15 0423    BP: 110/64 101/62  106/50  Pulse: 104 103  109  Temp:  99.1 F (37.3 C)  98.9 F (37.2 C)  TempSrc:  Oral  Oral  Resp:  16    Height:    5\' 3"  (1.6 m)  Weight:    83.944 kg (185 lb 1 oz)  SpO2: 96% 99% 99% 100%   No intake or output data in the 24 hours ending 10/23/15 0827   Filed Weights   10/22/15 2151 10/23/15 0423  Weight: 87.091 kg (192 lb) 83.944 kg (185 lb 1 oz)    Exam:    Constitutional:  . Appears calm and comfortable Respiratory:  . CTA bilaterally, no w/r/r.  . Respiratory effort normal. No retractions or accessory muscle use Cardiovascular:  . Tachycardiac. no m/r/g . No LE extremity edema   Skin: Several open wounds right axilla with some tunneling noted Psychiatric:  . judgement and insight appear normal . Mental status o Mood, affect appropriate  I have personally reviewed following labs and imaging studies:  BMP stable  CBG without significant change  Hgb 6.9  Scheduled Meds: . amoxicillin-clavulanate  1 tablet Oral Q12H  . feeding supplement (ENSURE ENLIVE)  237 mL Oral BID BM  . insulin aspart  0-5 Units Subcutaneous QHS  . insulin aspart  0-9 Units Subcutaneous TID WC  . insulin glargine  18 Units Subcutaneous QHS  . pravastatin  40 mg Oral QHS   Continuous Infusions: . sodium chloride 100 mL/hr at 10/23/15 0430    Active Problems:   IDDM (insulin dependent diabetes mellitus) (HCC)   Hypotension   Anemia of chronic disease   Abscess of axilla, right     Time spent 25 minutes   By signing my name below, I,  Zadie Cleverly attest that this documentation has been prepared under the direction and in the presence of Brendia Sacks, MD Electronically signed: Zadie Cleverly  10/23/2015   I personally performed the services described in this documentation. All medical record entries made by the scribe were at my direction. I have reviewed the chart and agree that the record reflects my personal performance and is accurate and  complete. Brendia Sacks, MD

## 2015-10-23 NOTE — ED Notes (Signed)
Dr Preston FleetingGlick informed of patient's rising temp

## 2015-10-23 NOTE — ED Notes (Signed)
Physician in to reassess 

## 2015-10-23 NOTE — Progress Notes (Signed)
Inpatient Diabetes Program Recommendations  AACE/ADA: New Consensus Statement on Inpatient Glycemic Control (2015)  Target Ranges:  Prepandial:   less than 140 mg/dL      Peak postprandial:   less than 180 mg/dL (1-2 hours)      Critically ill patients:  140 - 180 mg/dL   Review of Glycemic Control:  Results for [name], Miia A (MRN [id number]) as of 10/23/2015 10:56  Ref. Range 10/22/2015 23:01 10/23/2015 05:27 10/23/2015 07:23  Glucose-Capillary Latest Ref Range: 65-99 mg/dL 413303 (H) 244317 (H) 010314 (H)    Diabetes history: IDDM per problem list Outpatient Diabetes medications: Lantus 18 units q HS (last dose 10/21/15 per medication reconciliation), Novolog 3 units tid with meals, Novolog correction Current orders for Inpatient glycemic control:  Lantus 18 units q HS, Novolog sensitive tid with meals and HS  Inpatient Diabetes Program Recommendations:    Text page sent to MD requesting that Lantus be given now instead of waiting til HS since CBG this morning 314 mg/dL.  Note per Med. Rec, last dose of Lantus given 10/21/15.  Thanks, Beryl MeagerJenny Omega Slager, RN, BC-ADM Inpatient Diabetes Coordinator Pager [phone] (8a-5p)

## 2015-10-23 NOTE — H&P (Signed)
[facility] Admission History and Physical       [name]:[id number] DOB: 06/27/1955 DOA: 10/22/2015  Referring physician:  EDP PCP: [name], MD  Specialists:   Chief Complaint: Increased Pain and Drainage from Right underarm Abscess  HPI: [name] is a 60 y.o. female with a history of DM2, and HTN who presents to the Ed with complaints of worsening of an abscess of her Right underarm area.   She reports increased pain and purulent drainage form the area.  She reports subjective fevers and chills for the past 2-3 days.   She was hospitalized on 10/12/2015 and had and I+D of the Right Axilla performed by Dr Franky Macho and was prescribed Augmentin BID on discharge on 10/19/2015.   She was evaluated in the ED and placed on IV Vancomycin and referred for observation.       Review of Systems:  Constitutional: No Weight Loss, No Weight Gain, Night Sweats, +Fevers, +Chills, Dizziness, Light Headedness, Fatigue, or Generalized Weakness HEENT: No Headaches, Difficulty Swallowing,Tooth/Dental Problems,Sore Throat,  No Sneezing, Rhinitis, Ear Ache, Nasal Congestion, or Post Nasal Drip,  Cardio-vascular:  No Chest pain, Orthopnea, PND, Edema in Lower Extremities, Anasarca, Dizziness, Palpitations  Resp: No Dyspnea, No DOE, No Productive Cough, No Non-Productive Cough, No Hemoptysis, No Wheezing.    GI: No Heartburn, Indigestion, Abdominal Pain, Nausea, Vomiting, Diarrhea, Constipation, Hematemesis, Hematochezia, Melena, Change in Bowel Habits,  Loss of Appetite  GU: No Dysuria, No Change in Color of Urine, No Urgency or Urinary Frequency, No Flank pain.  Musculoskeletal: No Joint Pain or Swelling, No Decreased Range of Motion, No Back Pain.  Neurologic: No Syncope, No Seizures, Muscle Weakness, Paresthesia, Vision Disturbance or Loss, No Diplopia, No Vertigo, No Difficulty Walking,  Skin: +Drainage from Right Axilla.    Psych: No Change in Mood or Affect,  No Depression or Anxiety, No Memory loss, No Confusion, or Hallucinations   Past Medical History  Diagnosis Date  . Diabetes mellitus without complication (HCC)   . Hypertension      Past Surgical History  Procedure Laterality Date  . Incision and drainage abscess Right 08/08/2015    Procedure: Irrigation and Debridement of Right Lower Ext.;  Surgeon: Axel Filler, MD;  Location: [facility] OR;  Service: General;  Laterality: Right;  . Incision and drainage Right 08/09/2015    Procedure: INCISION AND DRAINAGE;  Surgeon: De Blanch Kinsinger, MD;  Location: [facility] OR;  Service: General;  Laterality: Right;      Prior to Admission medications   Medication Sig Start Date End Date Taking? Authorizing Provider  albuterol (PROAIR HFA) 108 (90 Base) MCG/ACT inhaler Inhale 2 puffs into the lungs every 6 (six) hours as needed for wheezing or shortness of breath.   Yes Historical Provider, MD  amoxicillin-clavulanate (AUGMENTIN) 875-125 MG tablet Take 1 tablet by mouth 2 (two) times daily. 10/19/15  Yes Standley Brooking, MD  aspirin EC 325 MG tablet Take 325 mg by mouth daily.   Yes Historical Provider, MD  clobetasol cream (TEMOVATE) 0.05 % Apply topically 2 (two) times daily. 09/21/15  Yes Leroy Sea, MD  FLUoxetine (PROZAC) 20 MG capsule Take 20 mg by mouth 2 (two) times daily.  05/11/15  Yes Historical Provider, MD  insulin aspart (NOVOLOG FLEXPEN) 100 UNIT/ML FlexPen Inject 3 Units into the skin 3 (three) times daily with meals.   Yes Historical Provider, MD  insulin glargine (LANTUS) 100 UNIT/ML injection Inject 0.18 mLs (18 Units total) into the  skin 2 (two) times daily. Patient taking differently: Inject 18 Units into the skin at bedtime.  08/15/15  Yes Leroy SeaPrashant K Singh, MD  metoprolol tartrate (LOPRESSOR) 25 MG tablet Take 1 tablet (25 mg total) by mouth 2 (two) times daily. 09/21/15  Yes Leroy SeaPrashant K Singh, MD  oxyCODONE (OXY IR/ROXICODONE) 5 MG immediate release tablet Take 5-10 mg by mouth every 6  (six) hours as needed. For pain 10/05/15  Yes Historical Provider, MD  pravastatin (PRAVACHOL) 40 MG tablet Take 40 mg by mouth at bedtime.  05/11/15  Yes Historical Provider, MD  HYDROcodone-acetaminophen (NORCO/VICODIN) 5-325 MG tablet Take 1 tablet by mouth every 6 (six) hours as needed for moderate pain. Patient not taking: Reported on 10/22/2015 10/19/15   Standley Brookinganiel P Goodrich, MD  insulin aspart (NOVOLOG) 100 UNIT/ML injection Before each meal 3 times a day, 140-199 - 2 units, 200-250 - 4 units, 251-299 - 6 units,  300-349 - 8 units,  350 or above 10 units. Patient not taking: Reported on 09/09/2015 08/15/15   Leroy SeaPrashant K Singh, MD     Allergies  Allergen Reactions  . Other     Per pt some type of mycin "some antibiotic sent me into renal failure."    Social History:  reports that she has been smoking Cigarettes.  She has been smoking about 0.50 packs per day. She does not have any smokeless tobacco history on file. She reports that she drinks alcohol. She reports that she does not use illicit drugs.    History reviewed. No pertinent family history.     Physical Exam:  GEN: Pleasant Obese Elderly 60 y.o. African American female examined and in no acute distress; cooperative with exam Filed Vitals:   10/23/15 0030 10/23/15 0129 10/23/15 0130 10/23/15 0200  BP: 98/44 103/60 109/60 110/64  Pulse: 102 105 105 104  Temp:  100 F (37.8 C)    TempSrc:  Oral    Resp:  18    Height:      Weight:      SpO2: 93% 100% 98% 96%   Blood pressure 110/64, pulse 104, temperature 100 F (37.8 C), temperature source Oral, resp. rate 18, height 5\' 3"  (1.6 m), weight 87.091 kg (192 lb), SpO2 96 %. PSYCH: She is alert and oriented x4; does not appear anxious does not appear depressed; affect is normal HEENT: Normocephalic and Atraumatic, Mucous membranes pink; PERRLA; EOM intact; Fundi:  Benign;  No scleral icterus, Nares: Patent, Oropharynx: Clear, Edentulous,    Neck:  FROM, No Cervical Lymphadenopathy  nor Thyromegaly or Carotid Bruit; No JVD; Breasts:: Not examined CHEST WALL: No tenderness CHEST: Normal respiration, clear to auscultation bilaterally HEART: Regular rate and rhythm; no murmurs rubs or gallops BACK: No kyphosis or scoliosis; No CVA tenderness ABDOMEN: Positive Bowel Sounds, Obese, Soft Non-Tender, No Rebound or Guarding; No Masses, No Organomegaly Rectal Exam: Not done EXTREMITIES: No Cyanosis, Clubbing, or Edema; No Ulcerations. Genitalia: not examined PULSES: 2+ and symmetric SKIN: +Right Axillary Abscess/ Nodules   CNS:  Alert and Oriented x 4, No Focal Deficits Vascular: pulses palpable throughout    Labs on Admission:  Basic Metabolic Panel:  Recent Labs Lab 10/16/15 0539 10/17/15 0909 10/18/15 0631 10/23/15 0012  NA 140 142 139 135  K 3.3* 3.2* 3.2* 3.6  CL 108 108 107 107  CO2 22 23 23  21*  GLUCOSE 89 101* 114* 295*  BUN 79* 77* 74* 56*  CREATININE 1.45* 1.73* 1.92* 1.76*  CALCIUM 8.3* 8.6* 8.6* 8.3*  Liver Function Tests: No results for input(s): AST, ALT, ALKPHOS, BILITOT, PROT, ALBUMIN in the last 168 hours. No results for input(s): LIPASE, AMYLASE in the last 168 hours. No results for input(s): AMMONIA in the last 168 hours. CBC:  Recent Labs Lab 10/16/15 0539 10/17/15 0909 10/18/15 0631 10/23/15 0012  WBC 13.6* 10.6* 14.0* 11.2*  NEUTROABS  --   --   --  8.9*  HGB 7.7* 7.8* 7.4* 7.0*  HCT 23.1* 24.2* 23.0* 20.9*  MCV 92.4 93.1 93.5 92.5  PLT 372 317 323 353   Cardiac Enzymes: No results for input(s): CKTOTAL, CKMB, CKMBINDEX, TROPONINI in the last 168 hours.  BNP (last 3 results) No results for input(s): BNP in the last 8760 hours.  ProBNP (last 3 results) No results for input(s): PROBNP in the last 8760 hours.  CBG:  Recent Labs Lab 10/19/15 0006 10/19/15 0414 10/19/15 0744 10/19/15 1125 10/22/15 2301  GLUCAP 104* 74 85 240* 303*    Radiological Exams on Admission: No results found.        Assessment/Plan:       60 y.o. female with  Active Problems:    Abscess of axilla, right   IV Vancomycin   Wound Care Consult   Consult Dr Franky Macho In AM    Hypotension   IVFs   Hold Metoprolol Rx      IDDM (insulin dependent diabetes mellitus) (HCC)   Continue Lantus Insulin   StSI coverage PRN   Check HbA1C        Anemia of chronic disease   Monitor Trend      DVT Prophylaxis   SCDs    Code Status:     FULL CODE      Family Communication:   No Family Present    Disposition Plan:  Observation Status        Time spent: 4 Minutes      Ron Parker [facility] Pager [phone]   If 7AM -7PM Please Contact the Day Rounding Team MD for [facility]  If 7PM-7AM, Please Contact Night-Floor Coverage  [email or web address] Password [email or web address] 10/23/2015, 3:12 AM     ADDENDUM:   Patient was seen and examined on 10/23/2015

## 2015-10-23 NOTE — Progress Notes (Signed)
Pharmacy Antibiotic Note  [name] is a 60 y.o. female admitted on 10/22/2015 with cellulitis.  Pharmacy has been consulted for unasyn dosing.  Plan: Unasyn 3gm IV every 8 hours F/U  LOT and plan.  Monitor V/S  Height: 5\' 3"  (160 cm) Weight: 185 lb 1 oz (83.944 kg) IBW/kg (Calculated) : 52.4  Temp (24hrs), Avg:99.3 F (37.4 C), Min:98.9 F (37.2 C), Max:100 F (37.8 C)   Recent Labs Lab 10/17/15 0909 10/18/15 0631 10/23/15 0012 10/23/15 0650  WBC 10.6* 14.0* 11.2* 10.8*  CREATININE 1.73* 1.92* 1.76* 1.89*    Estimated Creatinine Clearance: 32.5 mL/min (by C-G formula based on Cr of 1.89).    Allergies  Allergen Reactions  . Other     Per pt some type of mycin "some antibiotic sent me into renal failure."    Antimicrobials this admission: Vancomycin 5/15 >> 5/15 Augmentin 5/15 >> 5/15(was on outpt) Unasyn 5/15>>  Dose adjustments this admission:   Microbiology results: None done this admission 10/12/2015 wound culture: Grp B strep.  10/12/2015 urine cx: Enterobacter aer.  S- Cipro, septra I-ceftriaxone R- zosyn 10/12/2015 Blood cx: 1 of 2 +Anaerobic cx GNR.   Thank you for allowing pharmacy to be a part of this patient's care. [name], BS Pharm D, [place] Clinical Pharmacist Pager [phone] 10/23/2015 11:44 AM

## 2015-10-23 NOTE — Consult Note (Signed)
Reason for Consult: Right axillary abscess Referring Physician: Dr. [name] is an 60 y.o. female.  HPI: Patient is a 60 year old white female who was just discharged 4 days ago with a draining right axillary abscess who presented back to the emergency room with worsening right arm pain. She states that it has been draining. She has been on antibiotics. She was admitted for pain control and control for diabetes. Surgery is being reconsulted.  Past Medical History  Diagnosis Date  . Diabetes mellitus without complication ([place])   . Hypertension     Past Surgical History  Procedure Laterality Date  . Incision and drainage abscess Right 08/08/2015    Procedure: Irrigation and Debridement of Right Lower Ext.;  Surgeon: Ralene Ok, MD;  Location: [facility];  Service: General;  Laterality: Right;  . Incision and drainage Right 08/09/2015    Procedure: INCISION AND DRAINAGE;  Surgeon: Mickeal Skinner, MD;  Location: [facility];  Service: General;  Laterality: Right;    History reviewed. No pertinent family history.  Social History:  reports that she has been smoking Cigarettes.  She has been smoking about 0.50 packs per day. She does not have any smokeless tobacco history on file. She reports that she drinks alcohol. She reports that she does not use illicit drugs.  Allergies:  Allergies  Allergen Reactions  . Other     Per pt some type of mycin "some antibiotic sent me into renal failure."    Medications:  Scheduled: . amoxicillin-clavulanate  1 tablet Oral Q12H  . feeding supplement (ENSURE ENLIVE)  237 mL Oral BID BM  . insulin aspart  0-5 Units Subcutaneous QHS  . insulin aspart  0-9 Units Subcutaneous TID WC  . insulin glargine  18 Units Subcutaneous QHS  . pravastatin  40 mg Oral QHS    Results for orders placed or performed during the hospital encounter of 10/22/15 (from the past 48 hour(s))  CBG monitoring, ED     Status: Abnormal   Collection Time:  10/22/15 11:01 PM  Result Value Ref Range   Glucose-Capillary 303 (H) 65 - 99 mg/dL  Basic metabolic panel     Status: Abnormal   Collection Time: 10/23/15 12:12 AM  Result Value Ref Range   Sodium 135 135 - 145 mmol/L   Potassium 3.6 3.5 - 5.1 mmol/L   Chloride 107 101 - 111 mmol/L   CO2 21 (L) 22 - 32 mmol/L   Glucose, Bld 295 (H) 65 - 99 mg/dL   BUN 56 (H) 6 - 20 mg/dL   Creatinine, Ser 1.76 (H) 0.44 - 1.00 mg/dL   Calcium 8.3 (L) 8.9 - 10.3 mg/dL   GFR calc non Af Amer 30 (L) >60 mL/min   GFR calc Af Amer 35 (L) >60 mL/min    Comment: (NOTE) The eGFR has been calculated using the CKD EPI equation. This calculation has not been validated in all clinical situations. eGFR's persistently <60 mL/min signify possible Chronic Kidney Disease.    Anion gap 7 5 - 15  CBC with Differential     Status: Abnormal   Collection Time: 10/23/15 12:12 AM  Result Value Ref Range   WBC 11.2 (H) 4.0 - 10.5 K/uL   RBC 2.26 (L) 3.87 - 5.11 MIL/uL   Hemoglobin 7.0 (L) 12.0 - 15.0 g/dL   HCT 20.9 (L) 36.0 - 46.0 %   MCV 92.5 78.0 - 100.0 fL   MCH 31.0 26.0 - 34.0 pg   MCHC  33.5 30.0 - 36.0 g/dL   RDW 16.1 (H) 11.5 - 15.5 %   Platelets 353 150 - 400 K/uL   Neutrophils Relative % 80 %   Neutro Abs 8.9 (H) 1.7 - 7.7 K/uL   Lymphocytes Relative 12 %   Lymphs Abs 1.4 0.7 - 4.0 K/uL   Monocytes Relative 8 %   Monocytes Absolute 0.9 0.1 - 1.0 K/uL   Eosinophils Relative 0 %   Eosinophils Absolute 0.0 0.0 - 0.7 K/uL   Basophils Relative 0 %   Basophils Absolute 0.0 0.0 - 0.1 K/uL  Glucose, capillary     Status: Abnormal   Collection Time: 10/23/15  5:27 AM  Result Value Ref Range   Glucose-Capillary 317 (H) 65 - 99 mg/dL  Basic metabolic panel     Status: Abnormal   Collection Time: 10/23/15  6:50 AM  Result Value Ref Range   Sodium 134 (L) 135 - 145 mmol/L   Potassium 3.6 3.5 - 5.1 mmol/L   Chloride 106 101 - 111 mmol/L   CO2 21 (L) 22 - 32 mmol/L   Glucose, Bld 309 (H) 65 - 99 mg/dL    BUN 55 (H) 6 - 20 mg/dL   Creatinine, Ser 1.89 (H) 0.44 - 1.00 mg/dL   Calcium 8.1 (L) 8.9 - 10.3 mg/dL   GFR calc non Af Amer 28 (L) >60 mL/min   GFR calc Af Amer 32 (L) >60 mL/min    Comment: (NOTE) The eGFR has been calculated using the CKD EPI equation. This calculation has not been validated in all clinical situations. eGFR's persistently <60 mL/min signify possible Chronic Kidney Disease.    Anion gap 7 5 - 15  CBC     Status: Abnormal   Collection Time: 10/23/15  6:50 AM  Result Value Ref Range   WBC 10.8 (H) 4.0 - 10.5 K/uL   RBC 2.31 (L) 3.87 - 5.11 MIL/uL   Hemoglobin 6.9 (LL) 12.0 - 15.0 g/dL    Comment: RESULT REPEATED AND VERIFIED CRITICAL RESULT CALLED TO, READ BACK BY AND VERIFIED WITH: [name] ON [id number] AT 0730 BNY RESSEGGER    HCT 21.3 (L) 36.0 - 46.0 %   MCV 92.2 78.0 - 100.0 fL   MCH 29.9 26.0 - 34.0 pg   MCHC 32.4 30.0 - 36.0 g/dL   RDW 16.2 (H) 11.5 - 15.5 %   Platelets 351 150 - 400 K/uL  Glucose, capillary     Status: Abnormal   Collection Time: 10/23/15  7:23 AM  Result Value Ref Range   Glucose-Capillary 314 (H) 65 - 99 mg/dL    No results found.  ROS:  Constitutional: negative Eyes: negative Respiratory: negative Cardiovascular: negative Gastrointestinal: negative Genitourinary:negative Integument/breast: negative except for History of soft tissue infections in right groin  Blood pressure 106/50, pulse 109, temperature 98.9 F (37.2 C), temperature source Oral, resp. rate 16, height 5' 3"  (1.6 m), weight 83.944 kg (185 lb 1 oz), SpO2 100 %. Physical Exam: Obese black female in no acute distress. Neck is supple without lymphadenopathy. Lungs clear auscultation with breath sounds bilaterally. Heart examination reveals a regular rate and rhythm without s3, s4, murmurs. Skin examination reveals an open wound in the right axilla which is draining purulent material. Just lateral to this, an additional area of full-thickness skin  gangrene is noted. This area is tender to touch.   Assessment/Plan: Impression: Abscess, right axilla, poor healing secondary to uncontrolled diabetes mellitus, morbid obesity. Plan: Continue IV antibiotics and  control of her blood sugars as able. Will reassess in a.m. Patient may need to go to the operating room for further debridement.  [name] A 10/23/2015, 10:39 AM

## 2015-10-23 NOTE — Care Management Note (Signed)
Case Management Note  Patient Details  Name: [name] MRN: [id number] Date of Birth: 24-Aug-1955  Subjective/Objective:                  Pt is from home and [name]. Clydie BraunKaren, of [facility] is aware of admission and will be notified when pt is discharged. Pt plans to return home with resumption of HH services through [facility]. Will cont to follow for DC planning needs. Do not anticipate pt will have any new DME needs at DC.   Action/Plan: Will cont to follow.   Expected Discharge Date:     10/25/2015             Expected Discharge Plan:  Home w Home Health Services  In-House Referral:  NA  Discharge planning Services  CM Consult  Post Acute Care Choice:  Home Health, Resumption of Svcs/PTA Provider Choice offered to:  Patient  DME Arranged:    DME Agency:     HH Arranged:  RN HH Agency:  [facility]  Status of Service:  In process, will continue to follow  Medicare Important Message Given:    Date Medicare IM Given:    Medicare IM give by:    Date Additional Medicare IM Given:    Additional Medicare Important Message give by:     If discussed at Long Length of Stay Meetings, dates discussed:    Additional Comments:  Malcolm MetroChildress, Helvi Royals Demske, RN 10/23/2015, 3:11 PM

## 2015-10-23 NOTE — ED Notes (Signed)
Attempt to call caregivers unsuccessful

## 2015-10-24 DIAGNOSIS — N183 Chronic kidney disease, stage 3 unspecified: Secondary | ICD-10-CM

## 2015-10-24 LAB — CBC
HCT: 30 % — ABNORMAL LOW (ref 36.0–46.0)
HEMOGLOBIN: 10.1 g/dL — AB (ref 12.0–15.0)
MCH: 31.2 pg (ref 26.0–34.0)
MCHC: 33.7 g/dL (ref 30.0–36.0)
MCV: 92.6 fL (ref 78.0–100.0)
PLATELETS: 288 10*3/uL (ref 150–400)
RBC: 3.24 MIL/uL — AB (ref 3.87–5.11)
RDW: 15.7 % — ABNORMAL HIGH (ref 11.5–15.5)
WBC: 8.7 10*3/uL (ref 4.0–10.5)

## 2015-10-24 LAB — GLUCOSE, CAPILLARY
GLUCOSE-CAPILLARY: 289 mg/dL — AB (ref 65–99)
GLUCOSE-CAPILLARY: 278 mg/dL — AB (ref 65–99)
Glucose-Capillary: 252 mg/dL — ABNORMAL HIGH (ref 65–99)
Glucose-Capillary: 210 mg/dL — ABNORMAL HIGH (ref 65–99)

## 2015-10-24 LAB — BASIC METABOLIC PANEL
ANION GAP: 8 (ref 5–15)
BUN: 46 mg/dL — ABNORMAL HIGH (ref 6–20)
CALCIUM: 8.4 mg/dL — AB (ref 8.9–10.3)
CO2: 23 mmol/L (ref 22–32)
Chloride: 109 mmol/L (ref 101–111)
Creatinine, Ser: 1.67 mg/dL — ABNORMAL HIGH (ref 0.44–1.00)
GFR calc Af Amer: 37 mL/min — ABNORMAL LOW (ref >60)
GFR, EST NON AFRICAN AMERICAN: 32 mL/min — AB (ref >60)
Glucose, Bld: 296 mg/dL — ABNORMAL HIGH (ref 65–99)
Potassium: 3.6 mmol/L (ref 3.5–5.1)
Sodium: 140 mmol/L (ref 135–145)

## 2015-10-24 LAB — SURGICAL PCR SCREEN
MRSA, PCR: NEGATIVE
STAPHYLOCOCCUS AUREUS: NEGATIVE

## 2015-10-24 MED ORDER — PROCHLORPERAZINE EDISYLATE 5 MG/ML IJ SOLN
10.0000 mg | Freq: Four times a day (QID) | INTRAMUSCULAR | Status: DC | PRN
  Administered 2015-10-24: 10 mg via INTRAVENOUS
  Filled 2015-10-24: qty 2

## 2015-10-24 MED ORDER — INSULIN GLARGINE 100 UNIT/ML ~~LOC~~ SOLN
12.0000 [IU] | Freq: Every day | SUBCUTANEOUS | Status: DC
  Administered 2015-10-24 – 2015-10-25 (×2): 12 [IU] via SUBCUTANEOUS
  Filled 2015-10-24 (×3): qty 0.12

## 2015-10-24 MED ORDER — SODIUM CHLORIDE 0.9 % IV SOLN
Freq: Once | INTRAVENOUS | Status: AC
  Administered 2015-10-25: 1000 mL via INTRAVENOUS

## 2015-10-24 MED ORDER — CHLORHEXIDINE GLUCONATE 4 % EX LIQD
1.0000 "application " | Freq: Once | CUTANEOUS | Status: AC
  Administered 2015-10-24: 1 via TOPICAL
  Filled 2015-10-24: qty 15

## 2015-10-24 NOTE — Clinical Documentation Improvement (Signed)
Hospitalist  Please document query responses in the progress notes and discharge summary. Please do not document query responses on the CDI BPA in [facility]. Please do not deactivate queries without addressing them           Thank you.   "Group B strep, presumed polymicrobial abscess right axilla with associated gram-negative rod anaerobic organism bacteremia, present on admission, on Augmentin as an outpatient. Worsening despite antibiotic therapy, may require surgery" is documented by Dr. Janee Mornhompson in the progress note dated 10/22/25.  Please document if a condition below provides greater specificity regarding the documentation noted above:  - Group B Strep Sepsis, Present on Admission  - Patient does not have Sepsis this admission  - Unable to clinically determine  Please exercise your independent, professional judgment when responding. A specific answer is not anticipated or expected.   Thank You, Jerral Ralphathy R Shirlee Whitmire  RN BSN CCDS [phone] Health Information Management [place]

## 2015-10-24 NOTE — Progress Notes (Signed)
PROGRESS NOTE  Sophia EllisonGenevieve A [name] WUJ:[id number] DOB: Jul 17, 1955 DOA: 10/22/2015 PCP: Louie BostonAPPER,[name], MD  Brief Narrative: [age] yof with recent discharge hospitalization due to abscess or right axilla, presented with increased pain and purulent drainage in the abscess area. Admitted for worsening abscess.  Assessment/Plan: 1. Group B strep, presumed polymicrobial abscess right axilla with associated gram-negative rod anaerobic organism bacteremia (on last admission--felt to be transient in nature). Plan for operative debridement 5/17. 2. Anemia of acute illness superimposed on anemia of chronic disease. Hemoglobin improved appropriately status post transfusion 2 units PRBC 5/15. 3. DM type 2. Stable. 4. Chronic kidney disease stage III. Stable. She has an appointment with Dr. Kathrene BongoGoldsborough next week 5. Obesity 6. Status post acute renal failure 09/2015, thought to be secondary to ATN. 7. Status post necrotizing fasciitis 08/2015   Pain control improved today. Continue Unasyn. Plan for operative incision, drainage and debridement of wound 5/17.  DVT prophylaxis: SCDs Code Status: Full Family Communication: Discussed with patient. No family at bedside. Disposition Plan: Home  Brendia Sacksaniel Mikhaila Roh, MD  [facility] Direct contact:  --Via amion app OR  --[email or web address]; password [email or web address] and click  7PM-7AM contact night coverage as above 10/24/2015, 4:48 PM  LOS: 1 day   Consultants:  General surgery  Procedures:  2 units pRBCs transfusion 5/15  Antimicrobials:  Unasyn 5/15 >>  HPI/Subjective: Pain is a bit better controlled today. Some vomiting today.  Objective: Filed Vitals:   10/24/15 0604 10/24/15 0635 10/24/15 0907 10/24/15 1452  BP: 111/60 114/60 126/63 117/68  Pulse: 93 90 94 92  Temp: 98.8 F (37.1 C) 98.7 F (37.1 C) 98.4 F (36.9 C) 98.2 F (36.8 C)  TempSrc: Oral Oral Oral Oral  Resp: 20 20 20 18   Height:      Weight:      SpO2: 100% 100% 100% 100%     Intake/Output Summary (Last 24 hours) at 10/24/15 1648 Last data filed at 10/24/15 1300  Gross per 24 hour  Intake    705 ml  Output    300 ml  Net    405 ml     Filed Weights   10/22/15 2151 10/23/15 0423  Weight: 87.091 kg (192 lb) 83.944 kg (185 lb 1 oz)    Exam: Constitutional:  . Appears calm and comfortable Respiratory:  . CTA bilaterally, no w/r/r.  . Respiratory effort normal. No retractions or accessory muscle use Cardiovascular:  . RRR, no m/r/g . No LE extremity edema   Skin:  . Not examined today Psychiatric:  . judgement and insight appear normal . Mental status o Mood, affect appropriate  I have personally reviewed following labs and imaging studies:  Basic metabolic panel unremarkable  Hemoglobin 10.1 status post transfusion  WBC normal, 8.7  BUN 46, Cr 1.67. stable  Glucose 296  MRSA PCR negative  Scheduled Meds: . sodium chloride   Intravenous Once  . ampicillin-sulbactam (UNASYN) IV  3 g Intravenous Q8H  . feeding supplement (ENSURE ENLIVE)  237 mL Oral BID BM  . insulin aspart  0-5 Units Subcutaneous QHS  . insulin aspart  0-9 Units Subcutaneous TID WC  . insulin glargine  12 Units Subcutaneous QHS  . multivitamin with minerals  1 tablet Oral Daily  . pravastatin  40 mg Oral QHS   Continuous Infusions:    Principal Problem:   Abscess of axilla, right Active Problems:   IDDM (insulin dependent diabetes mellitus) (HCC)   Anemia of chronic disease   LOS: 1 day  Time spent 15 minutes   By signing my name below, I, Adron Bene, attest that this documentation has been prepared under the direction and in the presence of Iyanla Eilers P. Irene Limbo, MD. Electronically Signed: Adron Bene, Scribe.  10/24/2015 11:15am  I personally performed the services described in this documentation. All medical record entries made by the scribe were at my direction. I have reviewed the chart and agree that the record reflects my personal  performance and is accurate and complete. Brendia Sacks, MD

## 2015-10-24 NOTE — Progress Notes (Signed)
  Subjective: Still with draining right axillary abscess, but pain seems to be improving.  Objective: Vital signs in last 24 hours: Temp:  [98.3 F (36.8 C)-98.8 F (37.1 C)] 98.7 F (37.1 C) (05/16 0635) Pulse Rate:  [90-100] 90 (05/16 0635) Resp:  [20] 20 (05/16 0635) BP: (95-114)/(50-62) 114/60 mmHg (05/16 0635) SpO2:  [100 %] 100 % (05/16 0635) Last BM Date: 10/22/15  Intake/Output from previous day: 05/15 0701 - 05/16 0700 In: 845 [P.O.:480; Blood:365] Out: 550 [Urine:550] Intake/Output this shift:    General appearance: alert, cooperative and no distress Resp: clear to auscultation bilaterally Cardio: regular rate and rhythm, S1, S2 normal, no murmur, click, rub or gallop Skin: Draining right axillary wound with gangrenous skin changes present. This appears to be full-thickness in nature.  Lab Results:   Recent Labs  10/23/15 0012 10/23/15 0650  WBC 11.2* 10.8*  HGB 7.0* 6.9*  HCT 20.9* 21.3*  PLT 353 351   BMET  Recent Labs  10/23/15 0012 10/23/15 0650  NA 135 134*  K 3.6 3.6  CL 107 106  CO2 21* 21*  GLUCOSE 295* 309*  BUN 56* 55*  CREATININE 1.76* 1.89*  CALCIUM 8.3* 8.1*   PT/INR No results for input(s): LABPROT, INR in the last 72 hours.  Studies/Results: No results found.  Anti-infectives: Anti-infectives    Start     Dose/Rate Route Frequency Ordered Stop   10/24/15 0300  vancomycin (VANCOCIN) IVPB 1000 mg/200 mL premix  Status:  Discontinued     1,000 mg 200 mL/hr over 60 Minutes Intravenous Every 24 hours 10/23/15 0851 10/23/15 0906   10/23/15 1230  Ampicillin-Sulbactam (UNASYN) 3 g in sodium chloride 0.9 % 100 mL IVPB     3 g 100 mL/hr over 60 Minutes Intravenous Every 8 hours 10/23/15 1147     10/23/15 1000  amoxicillin-clavulanate (AUGMENTIN) 875-125 MG per tablet 1 tablet  Status:  Discontinued     1 tablet Oral Every 12 hours 10/23/15 0735 10/23/15 1136   10/23/15 0300  vancomycin (VANCOCIN) IVPB 1000 mg/200 mL premix     1,000 mg 200 mL/hr over 60 Minutes Intravenous  Once 10/23/15 0248 10/23/15 0408      Assessment/Plan: Impression: Non-resolving right axillary abscess with gangrene of skin edges Plan: We'll take patient to the OR tomorrow right axillary abscess. The risks and benefits of the procedure were fully explained to the patient, who gave informed consent.  LOS: 1 day    Tami Blass A 10/24/2015

## 2015-10-25 ENCOUNTER — Encounter (HOSPITAL_COMMUNITY)
Admission: EM | Disposition: A | Discharge status: Home Health | Payer: Self-pay | Source: Home / Self Care | Attending: Family Medicine

## 2015-10-25 ENCOUNTER — Inpatient Hospital Stay (HOSPITAL_COMMUNITY): Payer: BLUE CROSS/BLUE SHIELD | Admitting: Anesthesiology

## 2015-10-25 ENCOUNTER — Encounter (HOSPITAL_COMMUNITY): Payer: Self-pay | Admitting: *Deleted

## 2015-10-25 DIAGNOSIS — L02411 Cutaneous abscess of right axilla: Principal | ICD-10-CM

## 2015-10-25 HISTORY — PX: INCISION AND DRAINAGE ABSCESS: SHX5864

## 2015-10-25 HISTORY — PX: APPLICATION OF WOUND VAC: SHX5189

## 2015-10-25 LAB — TYPE AND SCREEN
ABO/RH(D): A POS
ANTIBODY SCREEN: NEGATIVE
Unit division: 0
Unit division: 0

## 2015-10-25 LAB — BASIC METABOLIC PANEL
Anion gap: 6 (ref 5–15)
BUN: 37 mg/dL — AB (ref 6–20)
CHLORIDE: 112 mmol/L — AB (ref 101–111)
CO2: 25 mmol/L (ref 22–32)
CREATININE: 1.29 mg/dL — AB (ref 0.44–1.00)
Calcium: 8.4 mg/dL — ABNORMAL LOW (ref 8.9–10.3)
GFR calc Af Amer: 51 mL/min — ABNORMAL LOW (ref >60)
GFR calc non Af Amer: 44 mL/min — ABNORMAL LOW (ref >60)
GLUCOSE: 122 mg/dL — AB (ref 65–99)
Potassium: 3.2 mmol/L — ABNORMAL LOW (ref 3.5–5.1)
SODIUM: 143 mmol/L (ref 135–145)

## 2015-10-25 LAB — CBC
HEMATOCRIT: 30.4 % — AB (ref 36.0–46.0)
Hemoglobin: 10.2 g/dL — ABNORMAL LOW (ref 12.0–15.0)
MCH: 30.6 pg (ref 26.0–34.0)
MCHC: 33.6 g/dL (ref 30.0–36.0)
MCV: 91.3 fL (ref 78.0–100.0)
PLATELETS: 296 10*3/uL (ref 150–400)
RBC: 3.33 MIL/uL — ABNORMAL LOW (ref 3.87–5.11)
RDW: 16 % — AB (ref 11.5–15.5)
WBC: 6.3 10*3/uL (ref 4.0–10.5)

## 2015-10-25 LAB — GLUCOSE, CAPILLARY
GLUCOSE-CAPILLARY: 105 mg/dL — AB (ref 65–99)
GLUCOSE-CAPILLARY: 112 mg/dL — AB (ref 65–99)
Glucose-Capillary: 100 mg/dL — ABNORMAL HIGH (ref 65–99)
Glucose-Capillary: 104 mg/dL — ABNORMAL HIGH (ref 65–99)
Glucose-Capillary: 120 mg/dL — ABNORMAL HIGH (ref 65–99)

## 2015-10-25 SURGERY — INCISION AND DRAINAGE, ABSCESS
Anesthesia: General | Site: Axilla | Laterality: Right

## 2015-10-25 MED ORDER — FENTANYL CITRATE (PF) 250 MCG/5ML IJ SOLN
INTRAMUSCULAR | Status: AC
  Filled 2015-10-25: qty 5

## 2015-10-25 MED ORDER — SODIUM CHLORIDE 0.9 % IV SOLN
INTRAVENOUS | Status: DC | PRN
  Administered 2015-10-25: 07:00:00 via INTRAVENOUS

## 2015-10-25 MED ORDER — BUPIVACAINE HCL (PF) 0.5 % IJ SOLN
INTRAMUSCULAR | Status: AC
  Filled 2015-10-25: qty 30

## 2015-10-25 MED ORDER — ONDANSETRON HCL 4 MG/2ML IJ SOLN
INTRAMUSCULAR | Status: AC
  Filled 2015-10-25: qty 2

## 2015-10-25 MED ORDER — SODIUM CHLORIDE 0.9 % IR SOLN
Status: DC | PRN
  Administered 2015-10-25: 3000 mL
  Administered 2015-10-25: 1000 mL

## 2015-10-25 MED ORDER — MIDAZOLAM HCL 2 MG/2ML IJ SOLN
1.0000 mg | INTRAMUSCULAR | Status: DC | PRN
  Administered 2015-10-25: 2 mg via INTRAVENOUS

## 2015-10-25 MED ORDER — ONDANSETRON HCL 4 MG/2ML IJ SOLN: 4.0000 mg | Freq: Once | INTRAMUSCULAR | Status: DC | PRN

## 2015-10-25 MED ORDER — LACTATED RINGERS IV SOLN: INTRAVENOUS | Status: DC

## 2015-10-25 MED ORDER — ONDANSETRON HCL 4 MG/2ML IJ SOLN
4.0000 mg | Freq: Once | INTRAMUSCULAR | Status: AC
Start: 2015-10-25 — End: 2015-10-25
  Administered 2015-10-25: 4 mg via INTRAVENOUS

## 2015-10-25 MED ORDER — FENTANYL CITRATE (PF) 100 MCG/2ML IJ SOLN
INTRAMUSCULAR | Status: AC
  Filled 2015-10-25: qty 2

## 2015-10-25 MED ORDER — LIDOCAINE HCL 1 % IJ SOLN
INTRAMUSCULAR | Status: DC | PRN
  Administered 2015-10-25: 30 mg via INTRADERMAL

## 2015-10-25 MED ORDER — PROPOFOL 10 MG/ML IV BOLUS
INTRAVENOUS | Status: DC | PRN
  Administered 2015-10-25: 100 mg via INTRAVENOUS

## 2015-10-25 MED ORDER — MIDAZOLAM HCL 2 MG/2ML IJ SOLN
INTRAMUSCULAR | Status: AC
  Filled 2015-10-25: qty 2

## 2015-10-25 MED ORDER — FENTANYL CITRATE (PF) 100 MCG/2ML IJ SOLN
25.0000 ug | INTRAMUSCULAR | Status: DC | PRN
  Administered 2015-10-25 (×2): 50 ug via INTRAVENOUS

## 2015-10-25 MED ORDER — FENTANYL CITRATE (PF) 250 MCG/5ML IJ SOLN
INTRAMUSCULAR | Status: DC | PRN
  Administered 2015-10-25 (×5): 25 ug via INTRAVENOUS

## 2015-10-25 SURGICAL SUPPLY — 26 items
BAG HAMPER (MISCELLANEOUS) ×4 IMPLANT
CANISTER WOUND CARE 500ML ATS (WOUND CARE) ×4 IMPLANT
CLOTH BEACON ORANGE TIMEOUT ST (SAFETY) ×4 IMPLANT
COVER LIGHT HANDLE STERIS (MISCELLANEOUS) ×8 IMPLANT
DRSG VAC ATS LRG SENSATRAC (GAUZE/BANDAGES/DRESSINGS) ×4 IMPLANT
ELECT REM PT RETURN 9FT ADLT (ELECTROSURGICAL) ×4
ELECTRODE REM PT RTRN 9FT ADLT (ELECTROSURGICAL) ×2 IMPLANT
GLOVE BIOGEL M STRL SZ7.5 (GLOVE) ×4 IMPLANT
GLOVE BIOGEL PI IND STRL 7.0 (GLOVE) ×2 IMPLANT
GLOVE BIOGEL PI INDICATOR 7.0 (GLOVE) ×2
GLOVE ECLIPSE 6.5 STRL STRAW (GLOVE) ×4 IMPLANT
GLOVE SKINSENSE NS SZ6.5 (GLOVE) ×2
GLOVE SKINSENSE STRL SZ6.5 (GLOVE) ×2 IMPLANT
GLOVE SURG SS PI 7.5 STRL IVOR (GLOVE) ×4 IMPLANT
GOWN STRL REUS W/TWL LRG LVL3 (GOWN DISPOSABLE) ×8 IMPLANT
HANDPIECE INTERPULSE COAX TIP (DISPOSABLE) ×2
IV NS IRRIG 3000ML ARTHROMATIC (IV SOLUTION) ×4 IMPLANT
KIT ROOM TURNOVER APOR (KITS) ×4 IMPLANT
MANIFOLD NEPTUNE II (INSTRUMENTS) ×4 IMPLANT
MARKER SKIN DUAL TIP RULER LAB (MISCELLANEOUS) ×4 IMPLANT
NS IRRIG 1000ML POUR BTL (IV SOLUTION) ×4 IMPLANT
PACK MINOR (CUSTOM PROCEDURE TRAY) ×4 IMPLANT
PAD ARMBOARD 7.5X6 YLW CONV (MISCELLANEOUS) ×4 IMPLANT
SET BASIN LINEN APH (SET/KITS/TRAYS/PACK) ×4 IMPLANT
SET HNDPC FAN SPRY TIP SCT (DISPOSABLE) ×2 IMPLANT
SYR BULB IRRIGATION 50ML (SYRINGE) ×4 IMPLANT

## 2015-10-25 NOTE — Anesthesia Preprocedure Evaluation (Signed)
Anesthesia Evaluation  Patient identified by MRN, date of birth, ID band Patient awake    Reviewed: Allergy & Precautions, NPO status , Patient's Chart, lab work & pertinent test results  Airway Mallampati: II  TM Distance: >3 FB     Dental  (+) Edentulous Upper, Edentulous Lower   Pulmonary Current Smoker,    breath sounds clear to auscultation       Cardiovascular hypertension,  Rhythm:Regular Rate:Normal     Neuro/Psych PSYCHIATRIC DISORDERS Depression    GI/Hepatic   Endo/Other  diabetes, Insulin Dependent  Renal/GU CRFRenal disease     Musculoskeletal   Abdominal   Peds  Hematology  (+) anemia ,   Anesthesia Other Findings   Reproductive/Obstetrics                             Anesthesia Physical Anesthesia Plan  ASA: III  Anesthesia Plan: General   Post-op Pain Management:    Induction: Intravenous  Airway Management Planned: LMA  Additional Equipment:   Intra-op Plan:   Post-operative Plan: Extubation in OR  Informed Consent: I have reviewed the patients History and Physical, chart, labs and discussed the procedure including the risks, benefits and alternatives for the proposed anesthesia with the patient or authorized representative who has indicated his/her understanding and acceptance.     Plan Discussed with:   Anesthesia Plan Comments:         Anesthesia Quick Evaluation

## 2015-10-25 NOTE — Anesthesia Postprocedure Evaluation (Signed)
Anesthesia Post Note  Patient: [name]  Procedure(s) Performed: Procedure(s) (LRB): INCISION AND DRAINAGE ABSCESS RIGHT AXILLARY ABSCESS (Right)  Patient location during evaluation: PACU Anesthesia Type: MAC Level of consciousness: awake and alert Pain management: pain level controlled Vital Signs Assessment: post-procedure vital signs reviewed and stable Respiratory status: spontaneous breathing Cardiovascular status: blood pressure returned to baseline Postop Assessment: no signs of nausea or vomiting Anesthetic complications: no    Last Vitals:  Filed Vitals:   10/25/15 0930 10/25/15 0940  BP: 138/65   Pulse: 88 88  Temp:    Resp: 11 13    Last Pain:  Filed Vitals:   10/25/15 0942  PainSc: 4                  Linnie Mcglocklin

## 2015-10-25 NOTE — Anesthesia Procedure Notes (Signed)
Procedure Name: LMA Insertion Date/Time: 10/25/2015 7:36 AM Performed by: Glynn OctaveANIEL, Ferrell Flam E Pre-anesthesia Checklist: Patient identified, Patient being monitored, Emergency Drugs available, Timeout performed and Suction available Patient Re-evaluated:Patient Re-evaluated prior to inductionOxygen Delivery Method: Circle System Utilized Preoxygenation: Pre-oxygenation with 100% oxygen Intubation Type: IV induction Ventilation: Mask ventilation without difficulty LMA: LMA inserted LMA Size: 3.0 Number of attempts: 1 Placement Confirmation: positive ETCO2 and breath sounds checked- equal and bilateral

## 2015-10-25 NOTE — Clinical Social Work Note (Signed)
Clinical Social Work Assessment  Patient Details  Name: [name] MRN: [id number] Date of Birth: 1955-06-23  Date of referral:  10/25/15               Reason for consult:  Discharge Planning                Permission sought to share information with:  Other Permission granted to share information::  Yes, Verbal Permission Granted  Name::     Legrand Como  Agency::     Relationship::  friend  Contact Information:     Housing/Transportation Living arrangements for the past 2 months:  Single Family Home Source of Information:  Patient Patient Interpreter Needed:  None Criminal Activity/Legal Involvement Pertinent to Current Situation/Hospitalization:  No - Comment as needed Significant Relationships:  Siblings, Friend Lives with:  Friends, Siblings Do you feel safe going back to the place where you live?  Yes Need for family participation in patient care:  Yes (Comment)  Care giving concerns:  Pt has had several admissions to hospital recently.    Social Worker assessment / plan:  CSW met with pt at bedside. Pt alert and oriented and reports she lives at home with her brother and a friend. Pt feels she manages okay at home and states she is independent with ADLs and ambulates using a walker. She shared that she is retired from [employer] CSW discussed d/c care needs, including wound vac and possible IV antibiotics. Pt adamantly wants to return home with home health and will not consider SNF. She requested that CSW call her friend, Legrand Como who lives in the home to see if he felt comfortable with this. Legrand Como agrees. Pt states she went to a SNF earlier this year due to a wound and only stayed 2 days because she did not like it. SNF list left in room. MD aware of pt's decision.   Employment status:  Retired Forensic scientist:  Managed Care PT Recommendations:  Not assessed at this time Information / Referral to community resources:  [place]  Patient/Family's  Response to care:  Pt requests to return home at d/c.   Patient/Family's Understanding of and Emotional Response to Diagnosis, Current Treatment, and Prognosis:  Pt reports understanding of treatment plan and is aware surgeon feels pt would benefit from SNF. However, pt refuses.   Emotional Assessment Appearance:  Appears stated age Attitude/Demeanor/Rapport:  Other (Cooperative) Affect (typically observed):  Appropriate Orientation:  Oriented to Self, Oriented to Place, Oriented to  Time, Oriented to Situation Alcohol / Substance use:  Not Applicable Psych involvement (Current and /or in the community):  No (Comment)  Discharge Needs  Concerns to be addressed:  Discharge Planning Concerns Readmission within the last 30 days:  Yes Current discharge risk:  Other (several recent admissions) Barriers to Discharge:  Continued Medical Work up   [employer], [employer], [place] 10/25/2015, 1:53 PM [phone]

## 2015-10-25 NOTE — Progress Notes (Signed)
PROGRESS NOTE    [name]  UJW:[id number] DOB: 07-Dec-1955 DOA: 10/22/2015 PCP: [name], MD     Brief Narrative:  59 y/o woman with recent discharge hospitalization due to abscess or right axilla, presented with increased pain and purulent drainage in the abscess area. Admitted for worsening abscess. Had surgical drainage and wound VAC placement on 5/17 by Dr. Lovell Sheehan.   Assessment & Plan:   Principal Problem:   Abscess of axilla, right Active Problems:   IDDM (insulin dependent diabetes mellitus) (HCC)   Anemia of chronic disease   CKD (chronic kidney disease), stage III   Abscess of right axilla -Status post surgical drainage, wound VAC in place. -Continue Unasyn. Prior culture with group B strep, no sepsis present on admission.  Type 2 diabetes mellitus -Well controlled, continue current management  Chronic kidney disease stage III -Stable, at baseline.  Status post necrotizing fasciitis -Noted.  Anemia of acute illness superimposed on anemia of chronic disease -Hemoglobin improved appropriately status post transfusion of 2 units of PRBCs on 5/15.   DVT prophylaxis: SCDs Code Status: Full code Family Communication: Patient only Disposition Plan: To be determined, surgery believes SNF may be appropriate venue  Consultants:   Surgery: Dr. Lovell Sheehan  Procedures:   Status post surgical incision and drainage of right axillary abscess on 5/17  Antimicrobials:   Unasyn    Subjective: Sleeping soundly, rises briefly to voice  Objective: Filed Vitals:   10/25/15 0930 10/25/15 0940 10/25/15 0945 10/25/15 1005  BP: 138/65  132/77 123/58  Pulse: 88 88 93 88  Temp:    97.7 F (36.5 C)  TempSrc:    Oral  Resp: Height:      Weight:      SpO2: 100% 100% 97% 100%    Intake/Output Summary (Last 24 hours) at 10/25/15 1328 Last data filed at 10/25/15 0940  Gross per 24 hour  Intake    670 ml  Output    200 ml  Net    470 ml     Filed Weights   10/22/15 2151 10/23/15 0423  Weight: 87.091 kg (192 lb) 83.944 kg (185 lb 1 oz)    Examination:  General exam: Drowsy Respiratory system: Clear to auscultation. Respiratory effort normal. Cardiovascular system:RRR. No murmurs, rubs, gallops. Gastrointestinal system: Abdomen is nondistended, soft and nontender. No organomegaly or masses felt. Normal bowel sounds heard. Central nervous system: Alert and oriented. No focal neurological deficits. Extremities: No C/C/E, +pedal pulses, wound VAC present to right axillary area Skin: No rashes, lesions or ulcers Psychiatry: Unable to fully assess given current mental state    Data Reviewed: I have personally reviewed following labs and imaging studies  CBC:  Recent Labs Lab 10/23/15 0012 10/23/15 0650 10/24/15 1040 10/25/15 0559  WBC 11.2* 10.8* 8.7 6.3  NEUTROABS 8.9*  --   --   --   HGB 7.0* 6.9* 10.1* 10.2*  HCT 20.9* 21.3* 30.0* 30.4*  MCV 92.5 92.2 92.6 91.3  PLT 353 351 288 296   Basic Metabolic Panel:  Recent Labs Lab 10/23/15 0012 10/23/15 0650 10/24/15 1040 10/25/15 0559  NA 135 134* 140 143  K 3.6 3.6 3.6 3.2*  CL 107 106 109 112*  CO2 21* 21* 23 25  GLUCOSE 295* 309* 296* 122*  BUN 56* 55* 46* 37*  CREATININE 1.76* 1.89* 1.67* 1.29*  CALCIUM 8.3* 8.1* 8.4* 8.4*   GFR: Estimated Creatinine Clearance: 47.6 mL/min (by C-G formula based on Cr  of 1.29). Liver Function Tests: No results for input(s): AST, ALT, ALKPHOS, BILITOT, PROT, ALBUMIN in the last 168 hours. No results for input(s): LIPASE, AMYLASE in the last 168 hours. No results for input(s): AMMONIA in the last 168 hours. Coagulation Profile: No results for input(s): INR, PROTIME in the last 168 hours. Cardiac Enzymes: No results for input(s): CKTOTAL, CKMB, CKMBINDEX, TROPONINI in the last 168 hours. BNP (last 3 results) No results for input(s): PROBNP in the last 8760 hours. HbA1C: No results for input(s): HGBA1C in the  last 72 hours. CBG:  Recent Labs Lab 10/24/15 1659 10/24/15 2117 10/25/15 0723 10/25/15 0900 10/25/15 1127  GLUCAP 278* 210* 105* 100* 112*   Lipid Profile: No results for input(s): CHOL, HDL, LDLCALC, TRIG, CHOLHDL, LDLDIRECT in the last 72 hours. Thyroid Function Tests: No results for input(s): TSH, T4TOTAL, FREET4, T3FREE, THYROIDAB in the last 72 hours. Anemia Panel: No results for input(s): VITAMINB12, FOLATE, FERRITIN, TIBC, IRON, RETICCTPCT in the last 72 hours. Urine analysis:    Component Value Date/Time   COLORURINE YELLOW 10/12/2015 1820   APPEARANCEUR CLEAR 10/12/2015 1820   LABSPEC 1.015 10/12/2015 1820   PHURINE 5.5 10/12/2015 1820   GLUCOSEU >1000* 10/12/2015 1820   HGBUR LARGE* 10/12/2015 1820   BILIRUBINUR NEGATIVE 10/12/2015 1820   KETONESUR NEGATIVE 10/12/2015 1820   PROTEINUR TRACE* 10/12/2015 1820   NITRITE NEGATIVE 10/12/2015 1820   LEUKOCYTESUR MODERATE* 10/12/2015 1820   Sepsis Labs: @LABRCNTIP (procalcitonin:4,lacticidven:4)  ) Recent Results (from the past 240 hour(s))  Surgical pcr screen     Status: None   Collection Time: 10/24/15  9:19 AM  Result Value Ref Range Status   MRSA, PCR NEGATIVE NEGATIVE Final   Staphylococcus aureus NEGATIVE NEGATIVE Final    Comment:        The Xpert SA Assay (FDA approved for NASAL specimens in patients over 61 years of age), is one component of a comprehensive surveillance program.  Test performance has been validated by [facility] for patients greater than or equal to 61 year old. It is not intended to diagnose infection nor to guide or monitor treatment.          Radiology Studies: No results found.      Scheduled Meds: . ampicillin-sulbactam (UNASYN) IV  3 g Intravenous Q8H  . feeding supplement (ENSURE ENLIVE)  237 mL Oral BID BM  . insulin aspart  0-5 Units Subcutaneous QHS  . insulin aspart  0-9 Units Subcutaneous TID WC  . insulin glargine  12 Units Subcutaneous QHS  .  multivitamin with minerals  1 tablet Oral Daily  . pravastatin  40 mg Oral QHS   Continuous Infusions:    LOS: 2 days    Time spent: 25 minutes. Greater than 50% of this time was spent in direct contact with the patient coordinating care.     Chaya JanHERNANDEZ ACOSTA,ESTELA, MD [facility] Pager [phone]  If 7PM-7AM, please contact night-coverage [email or web address] Password [email or web address] 10/25/2015, 1:28 PM

## 2015-10-25 NOTE — Transfer of Care (Signed)
Immediate Anesthesia Transfer of Care Note  Patient: [name]  Procedure(s) Performed: Procedure(s): INCISION AND DRAINAGE ABSCESS RIGHT AXILLARY ABSCESS (Right)  Patient Location: PACU  Anesthesia Type:MAC  Level of Consciousness: awake and alert   Airway & Oxygen Therapy: Patient Spontanous Breathing and Patient connected to face mask oxygen  Post-op Assessment: Report given to RN  Post vital signs: Reviewed and stable  Last Vitals:  Filed Vitals:   10/25/15 0856 10/25/15 0900  BP: 168/47 168/47  Pulse: 87 88  Temp:    Resp:  13    Last Pain:  Filed Vitals:   10/25/15 0900  PainSc: Asleep      Patients Stated Pain Goal: 0 (10/24/15 [id number])  Complications: No apparent anesthesia complications

## 2015-10-25 NOTE — Op Note (Signed)
Patient:  Barrington EllisonGenevieve A Verdejo  DOB:  Jun 28, 1955  MRN:  [id number]   Preop Diagnosis:  Abscess, right axilla  Postop Diagnosis:  Same, soft tissue necrosis  Procedure:  Incision and debridement of right axilla, application of wound VAC  Surgeon:  Franky MachoMark Judia Arnott, M.D.  Anes:  Gen.  Indications:  Patient is a 60 year old black female who presents back to the hospital with worsening right axillary abscess. Cultures have been positive for group B strep. Necrotic tissue is also noted in the subcutaneous region. The patient now comes the operating room for incision and drainage, debridement, and possible wound VAC application. The risks and benefits of the procedure were fully explained to the patient, who gave informed consent.  Procedure note:  The patient was placed the supine position. After general anesthesia was administered, the right axilla was prepped and draped using usual sterile technique with Betadine. Surgical site confirmation was performed.  The patient had a large opening in the right axilla with further necrotic tissue present in the skin and the subcutaneous tissue. This did extend down to but did not include the muscle. Any necrotic tissue was sharply debrided with scissors. This was a full-thickness debridement along the edges that were necrotic. The total service involved was approximately 20 x 20 cm with tunneling medially over the pectoralis major muscle. 3 L of pulse lavage therapy was used. A large black sponge was then placed and a wound VAC applied. Good seal was noted.  All tape and needle counts were correct at the end of the procedure. The patient was awakened and transferred to PACU in stable condition.  Complications:  None  EBL:  Minimal  Specimen:  None

## 2015-10-26 ENCOUNTER — Encounter (HOSPITAL_COMMUNITY): Payer: Self-pay | Admitting: General Surgery

## 2015-10-26 LAB — BASIC METABOLIC PANEL
Anion gap: 5 (ref 5–15)
BUN: 34 mg/dL — ABNORMAL HIGH (ref 6–20)
CALCIUM: 8 mg/dL — AB (ref 8.9–10.3)
CO2: 24 mmol/L (ref 22–32)
Chloride: 117 mmol/L — ABNORMAL HIGH (ref 101–111)
Creatinine, Ser: 1.43 mg/dL — ABNORMAL HIGH (ref 0.44–1.00)
GFR, EST AFRICAN AMERICAN: 45 mL/min — AB (ref >60)
GFR, EST NON AFRICAN AMERICAN: 39 mL/min — AB (ref >60)
GLUCOSE: 91 mg/dL (ref 65–99)
Potassium: 3.4 mmol/L — ABNORMAL LOW (ref 3.5–5.1)
Sodium: 146 mmol/L — ABNORMAL HIGH (ref 135–145)

## 2015-10-26 LAB — CBC
HCT: 28.9 % — ABNORMAL LOW (ref 36.0–46.0)
Hemoglobin: 9.2 g/dL — ABNORMAL LOW (ref 12.0–15.0)
MCH: 30.1 pg (ref 26.0–34.0)
MCHC: 31.8 g/dL (ref 30.0–36.0)
MCV: 94.4 fL (ref 78.0–100.0)
PLATELETS: 299 10*3/uL (ref 150–400)
RBC: 3.06 MIL/uL — ABNORMAL LOW (ref 3.87–5.11)
RDW: 16.3 % — AB (ref 11.5–15.5)
WBC: 6.5 10*3/uL (ref 4.0–10.5)

## 2015-10-26 LAB — GLUCOSE, CAPILLARY: Glucose-Capillary: 80 mg/dL (ref 65–99)

## 2015-10-26 MED ORDER — ADULT MULTIVITAMIN W/MINERALS CH: 1.0000 | ORAL_TABLET | Freq: Every day | ORAL | Status: AC

## 2015-10-26 MED ORDER — AMOXICILLIN 500 MG PO TABS: 500.0000 mg | ORAL_TABLET | Freq: Two times a day (BID) | ORAL | Status: DC

## 2015-10-26 NOTE — Discharge Summary (Signed)
Physician Discharge Summary  [name] ZOX:[id number] DOB: 1955-10-01 DOA: 10/22/2015  PCP: [name], MD  Admit date: 10/22/2015 Discharge date: 10/26/2015  Time spent: 45 minutes  Recommendations for Outpatient Follow-up:  -will be discharged home today. -Follow-up with Dr. Lovell Sheehan in 2 weeks.   Discharge Diagnoses:  Principal Problem:   Abscess of axilla, right Active Problems:   IDDM (insulin dependent diabetes mellitus) (HCC)   Anemia of chronic disease   CKD (chronic kidney disease), stage III   Discharge Condition: stable and improved  Filed Weights   10/22/15 2151 10/23/15 0423  Weight: 87.091 kg (192 lb) 83.944 kg (185 lb 1 oz)    History of present illness:  As per Dr. Lovell Sheehan on 5/15: [name] is a 60 y.o. female with a history of DM2, and HTN who presents to the Ed with complaints of worsening of an abscess of her Right underarm area. She reports increased pain and purulent drainage form the area. She reports subjective fevers and chills for the past 2-3 days. She was hospitalized on 10/12/2015 and had and I+D of the Right Axilla performed by Dr Franky Macho and was prescribed Augmentin BID on discharge on 10/19/2015. She was evaluated in the ED and placed on IV Vancomycin and referred for observation.   Hospital Course:   Abscess of right axilla -Status post surgical drainage, wound VAC in place. -Continue Unasyn. Prior culture with group B strep, no sepsis present on admission.  Type 2 diabetes mellitus -Well controlled, continue current management  Chronic kidney disease stage III -Stable, at baseline.  Status post necrotizing fasciitis -Noted.  Anemia of acute illness superimposed on anemia of chronic disease -Hemoglobin improved appropriately status post transfusion of 2 units of PRBCs on 5/15.  Procedures:  Surgical incision and drainage of right axillary abscess on 5/17by Dr. Franky Macho    Consultations:  surgery  Discharge Instructions  Discharge Instructions    Diet - low sodium heart healthy    Complete by:  As directed      Increase activity slowly    Complete by:  As directed             Medication List    STOP taking these medications        amoxicillin-clavulanate 875-125 MG tablet  Commonly known as:  AUGMENTIN      TAKE these medications        amoxicillin 500 MG tablet  Commonly known as:  AMOXIL  Take 1 tablet (500 mg total) by mouth 2 (two) times daily.     aspirin EC 325 MG tablet  Take 325 mg by mouth daily.     clobetasol cream 0.05 %  Commonly known as:  TEMOVATE  Apply topically 2 (two) times daily.     FLUoxetine 20 MG capsule  Commonly known as:  PROZAC  Take 20 mg by mouth 2 (two) times daily.     insulin glargine 100 UNIT/ML injection  Commonly known as:  LANTUS  Inject 0.18 mLs (18 Units total) into the skin 2 (two) times daily.     metoprolol tartrate 25 MG tablet  Commonly known as:  LOPRESSOR  Take 1 tablet (25 mg total) by mouth 2 (two) times daily.     multivitamin with minerals Tabs tablet  Take 1 tablet by mouth daily.     NOVOLOG FLEXPEN 100 UNIT/ML FlexPen  Generic drug:  insulin aspart  Inject 3 Units into the skin 3 (three) times daily  with meals.     oxyCODONE 5 MG immediate release tablet  Commonly known as:  Oxy IR/ROXICODONE  Take 5-10 mg by mouth every 6 (six) hours as needed. For pain     pravastatin 40 MG tablet  Commonly known as:  PRAVACHOL  Take 40 mg by mouth at bedtime.     PROAIR HFA 108 (90 Base) MCG/ACT inhaler  Generic drug:  albuterol  Inhale 2 puffs into the lungs every 6 (six) hours as needed for wheezing or shortness of breath.       Allergies  Allergen Reactions  . Other     Per pt some type of mycin "some antibiotic sent me into renal failure."       Follow-up Information    Follow up with Dalia HeadingJENKINS,MARK A, MD. Schedule an appointment as soon as possible for a visit on  11/09/2015.   Specialty:  General Surgery   Why:  As needed.  Home Health may call me if transportation to my office a concern.   Contact information:   [address] [place] [id number] [phone]        The results of significant diagnostics from this hospitalization (including imaging, microbiology, ancillary and laboratory) are listed below for reference.    Significant Diagnostic Studies: No results found.  Microbiology: Recent Results (from the past 240 hour(s))  Surgical pcr screen     Status: None   Collection Time: 10/24/15  9:19 AM  Result Value Ref Range Status   MRSA, PCR NEGATIVE NEGATIVE Final   Staphylococcus aureus NEGATIVE NEGATIVE Final    Comment:        The Xpert SA Assay (FDA approved for NASAL specimens in patients over 61 years of age), is one component of a comprehensive surveillance program.  Test performance has been validated by [facility] for patients greater than or equal to 61 year old. It is not intended to diagnose infection nor to guide or monitor treatment.      Labs: Basic Metabolic Panel:  Recent Labs Lab 10/23/15 0012 10/23/15 0650 10/24/15 1040 10/25/15 0559 10/26/15 0618  NA 135 134* 140 143 146*  K 3.6 3.6 3.6 3.2* 3.4*  CL 107 106 109 112* 117*  CO2 21* 21* 23 25 24   GLUCOSE 295* 309* 296* 122* 91  BUN 56* 55* 46* 37* 34*  CREATININE 1.76* 1.89* 1.67* 1.29* 1.43*  CALCIUM 8.3* 8.1* 8.4* 8.4* 8.0*   Liver Function Tests: No results for input(s): AST, ALT, ALKPHOS, BILITOT, PROT, ALBUMIN in the last 168 hours. No results for input(s): LIPASE, AMYLASE in the last 168 hours. No results for input(s): AMMONIA in the last 168 hours. CBC:  Recent Labs Lab 10/23/15 0012 10/23/15 0650 10/24/15 1040 10/25/15 0559 10/26/15 0618  WBC 11.2* 10.8* 8.7 6.3 6.5  NEUTROABS 8.9*  --   --   --   --   HGB 7.0* 6.9* 10.1* 10.2* 9.2*  HCT 20.9* 21.3* 30.0* 30.4* 28.9*  MCV 92.5 92.2 92.6 91.3 94.4  PLT 353 351  288 296 299   Cardiac Enzymes: No results for input(s): CKTOTAL, CKMB, CKMBINDEX, TROPONINI in the last 168 hours. BNP: BNP (last 3 results) No results for input(s): BNP in the last 8760 hours.  ProBNP (last 3 results) No results for input(s): PROBNP in the last 8760 hours.  CBG:  Recent Labs Lab 10/25/15 0900 10/25/15 1127 10/25/15 1603 10/25/15 2033 10/26/15 0804  GLUCAP 100* 112* 104* 120* 80       Signed:  Chaya Jan  [facility] Pager: [phone] 10/26/2015, 11:30 AM

## 2015-10-26 NOTE — Progress Notes (Signed)
Reviewed discharge instructions with pt and answered questions at this time.   

## 2015-10-26 NOTE — Progress Notes (Signed)
Pt IV removed, tolerated well.  Pt did not want CBG monitoring done at this time, pt to discharge home.

## 2015-10-26 NOTE — Addendum Note (Signed)
Addendum  created 10/26/15 0755 by Earleen NewportAmy A Adams, CRNA   Modules edited: Clinical Notes   Clinical Notes:  File: [id number]

## 2015-10-26 NOTE — Anesthesia Postprocedure Evaluation (Signed)
Anesthesia Post Note  Patient: [name]  Procedure(s) Performed: Procedure(s) (LRB): INCISION AND DRAINAGE ABSCESS RIGHT AXILLARY ABSCESS (Right)  Patient location during evaluation: Nursing Unit Anesthesia Type: General Level of consciousness: awake and alert and oriented Pain management: pain level controlled Vital Signs Assessment: post-procedure vital signs reviewed and stable Respiratory status: spontaneous breathing Cardiovascular status: stable Postop Assessment: no signs of nausea or vomiting Anesthetic complications: no    Last Vitals:  Filed Vitals:   10/25/15 1404 10/25/15 2130  BP: 121/72 106/65  Pulse: 94 95  Temp: 36.7 C 36.7 C  Resp: 18 20    Last Pain:  Filed Vitals:   10/25/15 2131  PainSc: Asleep                 ADAMS, AMY A

## 2015-10-26 NOTE — Care Management Note (Signed)
Case Management Note  Patient Details  Name: [name] MRN: [id number] Date of Birth: 24-Jan-1956   Expected Discharge Date:    10/26/2015              Expected Discharge Plan:  Home w Home Health Services  In-House Referral:  NA  Discharge planning Services  CM Consult  Post Acute Care Choice:  Home Health, Resumption of Svcs/PTA Provider Choice offered to:  Patient  DME Arranged:  Negative pressure wound device [facility]  HH Arranged:  RN HH Agency:  [facility]  Status of Service:  Completed, signed off  Medicare Important Message Given:    Date Medicare IM Given:    Medicare IM give by:    Date Additional Medicare IM Given:    Additional Medicare Important Message give by:     If discussed at Long Length of Stay Meetings, dates discussed:    Additional Comments: Pt discharging home today with resumption of HH services through [facility]. Clydie BraunKaren, of [facility] made aware of DC today. Pt discharging with wound vac. Pt has no preference for wound vac provider. [facility] referred and Fayrene FearingJames of [facility] contacted and provided pt's info. [facility] and [facility] will schedule time on 10/27/2015 to meet at pt's home and place wound vac. Pt will leave hospital with wet-to-dry. No further  Malcolm MetroChildress, Talbot Monarch Demske, RN 10/26/2015, 1:34 PM

## 2015-10-26 NOTE — Progress Notes (Signed)
Removed wound vac and applied wet to dry dressing for pt to d/c home.

## 2015-10-26 NOTE — Progress Notes (Signed)
1 Day Post-Op  Subjective: Not having any pain in right axilla.  Objective: Vital signs in last 24 hours: Temp:  [97.7 F (36.5 C)-98 F (36.7 C)] 98 F (36.7 C) (05/17 2130) Pulse Rate:  [87-95] 95 (05/17 2130) Resp:  [11-20] 20 (05/17 2130) BP: (106-168)/(47-77) 106/65 mmHg (05/17 2130) SpO2:  [96 %-100 %] 100 % (05/17 2130) Last BM Date: 10/22/15  Intake/Output from previous day: 05/17 0701 - 05/18 0700 In: 870 [P.O.:120; I.V.:550; IV Piggyback:200] Out: 100 [Drains:75; Blood:25] Intake/Output this shift:    General appearance: alert, cooperative and no distress Skin: Right axillary wound VAC in place. Working well.  Lab Results:   Recent Labs  10/25/15 0559 10/26/15 0618  WBC 6.3 6.5  HGB 10.2* 9.2*  HCT 30.4* 28.9*  PLT 296 299   BMET  Recent Labs  10/25/15 0559 10/26/15 0618  NA 143 146*  K 3.2* 3.4*  CL 112* 117*  CO2 25 24  GLUCOSE 122* 91  BUN 37* 34*  CREATININE 1.29* 1.43*  CALCIUM 8.4* 8.0*   PT/INR No results for input(s): LABPROT, INR in the last 72 hours.  Studies/Results: No results found.  Anti-infectives: Anti-infectives    Start     Dose/Rate Route Frequency Ordered Stop   10/24/15 0300  vancomycin (VANCOCIN) IVPB 1000 mg/200 mL premix  Status:  Discontinued     1,000 mg 200 mL/hr over 60 Minutes Intravenous Every 24 hours 10/23/15 0851 10/23/15 0906   10/23/15 1230  Ampicillin-Sulbactam (UNASYN) 3 g in sodium chloride 0.9 % 100 mL IVPB     3 g 100 mL/hr over 60 Minutes Intravenous Every 8 hours 10/23/15 1147     10/23/15 1000  amoxicillin-clavulanate (AUGMENTIN) 875-125 MG per tablet 1 tablet  Status:  Discontinued     1 tablet Oral Every 12 hours 10/23/15 0735 10/23/15 1136   10/23/15 0300  vancomycin (VANCOCIN) IVPB 1000 mg/200 mL premix     1,000 mg 200 mL/hr over 60 Minutes Intravenous  Once 10/23/15 0248 10/23/15 0408      Assessment/Plan: s/p Procedure(s): INCISION AND DRAINAGE ABSCESS RIGHT AXILLARY  ABSCESS Impression: Stable, status post wound VAC application to right axilla. Okay for discharge from surgery standpoint. Oral antibiotics to cover group B strep should be satisfactory. Will follow-up in my office in 2 weeks.  LOS: 3 days    Joran Kallal A 10/26/2015

## 2015-10-31 ENCOUNTER — Encounter (HOSPITAL_COMMUNITY): Payer: Self-pay | Admitting: General Surgery

## 2015-11-15 ENCOUNTER — Emergency Department (HOSPITAL_COMMUNITY): Payer: BLUE CROSS/BLUE SHIELD

## 2015-11-15 ENCOUNTER — Encounter (HOSPITAL_COMMUNITY): Payer: Self-pay

## 2015-11-15 ENCOUNTER — Emergency Department (HOSPITAL_COMMUNITY)
Admission: EM | Admit: 2015-11-15 | Discharge: 2015-11-15 | Disposition: A | Discharge status: Home / Self Care | Payer: BLUE CROSS/BLUE SHIELD | Attending: Emergency Medicine | Admitting: Emergency Medicine

## 2015-11-15 DIAGNOSIS — R1084 Generalized abdominal pain: Secondary | ICD-10-CM | POA: Diagnosis not present

## 2015-11-15 DIAGNOSIS — I129 Hypertensive chronic kidney disease with stage 1 through stage 4 chronic kidney disease, or unspecified chronic kidney disease: Secondary | ICD-10-CM | POA: Diagnosis not present

## 2015-11-15 DIAGNOSIS — Z794 Long term (current) use of insulin: Secondary | ICD-10-CM | POA: Insufficient documentation

## 2015-11-15 DIAGNOSIS — Z7982 Long term (current) use of aspirin: Secondary | ICD-10-CM | POA: Diagnosis not present

## 2015-11-15 DIAGNOSIS — F17210 Nicotine dependence, cigarettes, uncomplicated: Secondary | ICD-10-CM | POA: Diagnosis not present

## 2015-11-15 DIAGNOSIS — R112 Nausea with vomiting, unspecified: Secondary | ICD-10-CM | POA: Insufficient documentation

## 2015-11-15 DIAGNOSIS — R109 Unspecified abdominal pain: Secondary | ICD-10-CM

## 2015-11-15 DIAGNOSIS — N189 Chronic kidney disease, unspecified: Secondary | ICD-10-CM | POA: Diagnosis not present

## 2015-11-15 DIAGNOSIS — E119 Type 2 diabetes mellitus without complications: Secondary | ICD-10-CM | POA: Insufficient documentation

## 2015-11-15 DIAGNOSIS — Z79899 Other long term (current) drug therapy: Secondary | ICD-10-CM | POA: Insufficient documentation

## 2015-11-15 LAB — CBC WITH DIFFERENTIAL/PLATELET
BASOS ABS: 0 10*3/uL (ref 0.0–0.1)
BASOS PCT: 0 %
EOS ABS: 0 10*3/uL (ref 0.0–0.7)
EOS PCT: 0 %
HCT: 25.5 % — ABNORMAL LOW (ref 36.0–46.0)
Hemoglobin: 8.7 g/dL — ABNORMAL LOW (ref 12.0–15.0)
Lymphocytes Relative: 10 %
Lymphs Abs: 1.6 10*3/uL (ref 0.7–4.0)
MCH: 30.4 pg (ref 26.0–34.0)
MCHC: 34.1 g/dL (ref 30.0–36.0)
MCV: 89.2 fL (ref 78.0–100.0)
MONO ABS: 0.8 10*3/uL (ref 0.1–1.0)
Monocytes Relative: 5 %
Neutro Abs: 12.9 10*3/uL — ABNORMAL HIGH (ref 1.7–7.7)
Neutrophils Relative %: 85 %
PLATELETS: 276 10*3/uL (ref 150–400)
RBC: 2.86 MIL/uL — AB (ref 3.87–5.11)
RDW: 15.9 % — AB (ref 11.5–15.5)
WBC: 15.3 10*3/uL — AB (ref 4.0–10.5)

## 2015-11-15 LAB — COMPREHENSIVE METABOLIC PANEL
ALT: 15 U/L (ref 14–54)
AST: 14 U/L — ABNORMAL LOW (ref 15–41)
Albumin: 1.6 g/dL — ABNORMAL LOW (ref 3.5–5.0)
Alkaline Phosphatase: 163 U/L — ABNORMAL HIGH (ref 38–126)
Anion gap: 10 (ref 5–15)
BUN: 28 mg/dL — ABNORMAL HIGH (ref 6–20)
CHLORIDE: 108 mmol/L (ref 101–111)
CO2: 18 mmol/L — ABNORMAL LOW (ref 22–32)
CREATININE: 1.96 mg/dL — AB (ref 0.44–1.00)
Calcium: 8.3 mg/dL — ABNORMAL LOW (ref 8.9–10.3)
GFR, EST AFRICAN AMERICAN: 31 mL/min — AB (ref >60)
GFR, EST NON AFRICAN AMERICAN: 27 mL/min — AB (ref >60)
Glucose, Bld: 115 mg/dL — ABNORMAL HIGH (ref 65–99)
POTASSIUM: 2.9 mmol/L — AB (ref 3.5–5.1)
Sodium: 136 mmol/L (ref 135–145)
Total Bilirubin: 0.3 mg/dL (ref 0.3–1.2)
Total Protein: 6 g/dL — ABNORMAL LOW (ref 6.5–8.1)

## 2015-11-15 LAB — URINALYSIS, ROUTINE W REFLEX MICROSCOPIC
BILIRUBIN URINE: NEGATIVE
Glucose, UA: NEGATIVE mg/dL
Ketones, ur: NEGATIVE mg/dL
NITRITE: NEGATIVE
SPECIFIC GRAVITY, URINE: 1.01 (ref 1.005–1.030)
pH: 5.5 (ref 5.0–8.0)

## 2015-11-15 LAB — URINE MICROSCOPIC-ADD ON

## 2015-11-15 LAB — TROPONIN I: Troponin I: 0.03 ng/mL (ref <0.031)

## 2015-11-15 LAB — LIPASE, BLOOD: LIPASE: 25 U/L (ref 11–51)

## 2015-11-15 MED ORDER — ONDANSETRON HCL 4 MG PO TABS: 4.0000 mg | ORAL_TABLET | Freq: Three times a day (TID) | ORAL | Status: DC | PRN

## 2015-11-15 MED ORDER — MORPHINE SULFATE (PF) 2 MG/ML IV SOLN
2.0000 mg | INTRAVENOUS | Status: DC | PRN
  Administered 2015-11-15: 2 mg via INTRAVENOUS
  Filled 2015-11-15: qty 1

## 2015-11-15 MED ORDER — POTASSIUM CHLORIDE CRYS ER 20 MEQ PO TBCR
40.0000 meq | EXTENDED_RELEASE_TABLET | Freq: Once | ORAL | Status: AC
  Administered 2015-11-15: 40 meq via ORAL
  Filled 2015-11-15: qty 2

## 2015-11-15 MED ORDER — POTASSIUM CHLORIDE 10 MEQ/100ML IV SOLN
10.0000 meq | Freq: Once | INTRAVENOUS | Status: AC
  Administered 2015-11-15: 10 meq via INTRAVENOUS
  Filled 2015-11-15: qty 100

## 2015-11-15 MED ORDER — ONDANSETRON HCL 4 MG/2ML IJ SOLN
4.0000 mg | INTRAMUSCULAR | Status: AC | PRN
  Administered 2015-11-15 (×2): 4 mg via INTRAVENOUS
  Filled 2015-11-15 (×2): qty 2

## 2015-11-15 MED ORDER — FAMOTIDINE IN NACL 20-0.9 MG/50ML-% IV SOLN
20.0000 mg | Freq: Once | INTRAVENOUS | Status: AC
  Administered 2015-11-15: 20 mg via INTRAVENOUS
  Filled 2015-11-15: qty 50

## 2015-11-15 MED ORDER — SODIUM CHLORIDE 0.9 % IV BOLUS (SEPSIS)
500.0000 mL | Freq: Once | INTRAVENOUS | Status: AC
  Administered 2015-11-15: 500 mL via INTRAVENOUS

## 2015-11-15 MED ORDER — SODIUM CHLORIDE 0.9 % IV SOLN
INTRAVENOUS | Status: DC
  Administered 2015-11-15: 17:00:00 via INTRAVENOUS

## 2015-11-15 NOTE — ED Notes (Signed)
Pt complain of abdominal pain, nausea and vomiting. Pt was given Zofran 4 mg by EMS, prior to arrival

## 2015-11-15 NOTE — Discharge Instructions (Signed)
Take the prescription as directed.  Increase your fluid intake (ie:  Gatoraide) for the next few days.  Eat a bland diet and advance to your regular diet slowly as you can tolerate it.  Call your regular medical doctor tomorrow to schedule a follow up appointment in the next 2 days. Call the Renal doctor ("kidney doctor") tomorrow to schedule a follow up appointment within the next 4 days.  Return to the Emergency Department immediately if not improving (or even worsening) despite taking the medicines as prescribed, any black or bloody stool or vomit, if you develop a fever over "101," or for any other concerns.

## 2015-11-15 NOTE — ED Provider Notes (Signed)
CSN: [id number]     Arrival date & time 11/15/15  1609 History   First MD Initiated Contact with Patient 11/15/15 1614     Chief Complaint  Patient presents with  . Abdominal Pain     HPI  Pt was seen at 1615.  Per pt, c/o gradual onset and persistence of constant generalized abd "pain" since this morning.  Has been associated with multiple intermittent episodes of N/V.  Describes the abd pain as "cramping."  Denies diarrhea, no fevers, no back pain, no rash, no CP/SOB, no black or blood in stools or emesis.      Past Medical History  Diagnosis Date  . Diabetes mellitus without complication (HCC)   . Hypertension    Past Surgical History  Procedure Laterality Date  . Incision and drainage abscess Right 08/08/2015    Procedure: Irrigation and Debridement of Right Lower Ext.;  Surgeon: Axel FillerArmando Ramirez, MD;  Location: [facility] OR;  Service: General;  Laterality: Right;  . Incision and drainage Right 08/09/2015    Procedure: INCISION AND DRAINAGE;  Surgeon: De BlanchLuke Aaron Kinsinger, MD;  [facility];  Service: General;  Laterality: Right;  . Incision and drainage abscess Right 10/25/2015    Procedure: INCISION AND DRAINAGE ABSCESS RIGHT AXILLARY ABSCESS;  Surgeon: Franky MachoMark Jenkins, MD;  Location: [facility] ORS;  Service: General;  Laterality: Right;  . Application of wound vac Right 10/25/2015    Procedure: APPLICATION OF WOUND VAC;  Surgeon: Franky MachoMark Jenkins, MD;  Location: [facility] ORS;  Service: General;  Laterality: Right;    Social History  Substance Use Topics  . Smoking status: Current Some Day Smoker -- 0.50 packs/day    Types: Cigarettes  . Smokeless tobacco: None  . Alcohol Use: Yes     Comment: occ.    Review of Systems ROS: Statement: All systems negative except as marked or noted in the HPI; Constitutional: Negative for fever and chills. ; ; Eyes: Negative for eye pain, redness and discharge. ; ; ENMT: Negative for ear pain, hoarseness, nasal congestion, sinus pressure and sore throat. ; ;  Cardiovascular: Negative for chest pain, palpitations, diaphoresis, dyspnea and peripheral edema. ; ; Respiratory: Negative for cough, wheezing and stridor. ; ; Gastrointestinal: +N/V, abd pain. Negative for diarrhea, blood in stool, hematemesis, jaundice and rectal bleeding. . ; ; Genitourinary: Negative for dysuria, flank pain and hematuria. ; ; Musculoskeletal: Negative for back pain and neck pain. Negative for swelling and trauma.; ; Skin: Negative for pruritus, rash, abrasions, blisters, bruising and skin lesion.; ; Neuro: Negative for headache, lightheadedness and neck stiffness. Negative for weakness, altered level of consciousness, altered mental status, extremity weakness, paresthesias, involuntary movement, seizure and syncope.      Allergies  Other  Home Medications   Prior to Admission medications   Medication Sig Start Date End Date Taking? Authorizing Provider  albuterol (PROAIR HFA) 108 (90 Base) MCG/ACT inhaler Inhale 2 puffs into the lungs every 6 (six) hours as needed for wheezing or shortness of breath.    Historical Provider, MD  amoxicillin (AMOXIL) 500 MG tablet Take 1 tablet (500 mg total) by mouth 2 (two) times daily. 10/26/15   Henderson CloudEstela Y Hernandez Acosta, MD  aspirin EC 325 MG tablet Take 325 mg by mouth daily.    Historical Provider, MD  clobetasol cream (TEMOVATE) 0.05 % Apply topically 2 (two) times daily. 09/21/15   Leroy SeaPrashant K Singh, MD  FLUoxetine (PROZAC) 20 MG capsule Take 20 mg by mouth 2 (two) times daily.  05/11/15  Historical Provider, MD  insulin aspart (NOVOLOG FLEXPEN) 100 UNIT/ML FlexPen Inject 3 Units into the skin 3 (three) times daily with meals.    Historical Provider, MD  insulin glargine (LANTUS) 100 UNIT/ML injection Inject 0.18 mLs (18 Units total) into the skin 2 (two) times daily. Patient taking differently: Inject 18 Units into the skin at bedtime.  08/15/15   Leroy Sea, MD  metoprolol tartrate (LOPRESSOR) 25 MG tablet Take 1 tablet (25 mg  total) by mouth 2 (two) times daily. 09/21/15   Leroy Sea, MD  Multiple Vitamin (MULTIVITAMIN WITH MINERALS) TABS tablet Take 1 tablet by mouth daily. 10/26/15   Henderson Cloud, MD  oxyCODONE (OXY IR/ROXICODONE) 5 MG immediate release tablet Take 5-10 mg by mouth every 6 (six) hours as needed. For pain 10/05/15   Historical Provider, MD  pravastatin (PRAVACHOL) 40 MG tablet Take 40 mg by mouth at bedtime.  05/11/15   Historical Provider, MD   BP 140/101 mmHg  Pulse 85  Resp 14  Ht 5\' 3"  (1.6 m)  Wt 180 lb (81.647 kg)  BMI 31.89 kg/m2  SpO2 100%  BP 138/68 mmHg  Pulse 84  Temp(Src) 97.8 F (36.6 C) (Rectal)  Resp 18  Ht 5\' 3"  (1.6 m)  Wt 180 lb (81.647 kg)  BMI 31.89 kg/m2  SpO2 100%  Physical Exam  1620: Physical examination:  Nursing notes reviewed; Vital signs and O2 SAT reviewed;  Constitutional: Well developed, Well nourished, Well hydrated, Uncomfortable appearing.; Head:  Normocephalic, atraumatic; Eyes: EOMI, PERRL, No scleral icterus; ENMT: Mouth and pharynx normal, Mucous membranes moist; Neck: Supple, Full range of motion, No lymphadenopathy; Cardiovascular: Regular rate and rhythm, No gallop; Respiratory: Breath sounds clear & equal bilaterally, No wheezes.  Speaking full sentences with ease, Normal respiratory effort/excursion; Chest: Nontender, +DSD right chest wall. Movement normal; Abdomen: Soft, +diffuse tenderness to palp. No rebound or guarding. Nondistended, Normal bowel sounds; Genitourinary: No CVA tenderness; Extremities: Pulses normal, No tenderness, No edema, No calf edema or asymmetry.; Neuro: AA&Ox3, Major CN grossly intact.  Speech clear. No gross focal motor or sensory deficits in extremities.; Skin: Color normal, Warm, Dry.   ED Course  Procedures (including critical care time) Labs Review  Imaging Review  I have personally reviewed and evaluated these images and lab results as part of my medical decision-making.   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 When  compared with ECG of 09/18/2015 No significant change was found Confirmed  by [facility]  MD, Nicholos Johns [phone]) on 11/15/2015 5:09:07 PM      MDM  MDM Reviewed: previous chart, nursing note and vitals Reviewed previous: labs and ECG Interpretation: labs, ECG, x-ray and CT scan      Results for orders placed or performed during the hospital encounter of 11/15/15  CBC with Differential  Result Value Ref Range   WBC 15.3 (H) 4.0 - 10.5 K/uL   RBC 2.86 (L) 3.87 - 5.11 MIL/uL   Hemoglobin 8.7 (L) 12.0 - 15.0 g/dL   HCT 60.4 (L) 54.0 - 98.1 %   MCV 89.2 78.0 - 100.0 fL   MCH 30.4 26.0 - 34.0 pg   MCHC 34.1 30.0 - 36.0 g/dL   RDW 19.1 (H) 47.8 - 29.5 %  Platelets 276 150 - 400 K/uL   Neutrophils Relative % 85 %   Neutro Abs 12.9 (H) 1.7 - 7.7 K/uL   Lymphocytes Relative 10 %   Lymphs Abs 1.6 0.7 - 4.0 K/uL   Monocytes Relative 5 %   Monocytes Absolute 0.8 0.1 - 1.0 K/uL   Eosinophils Relative 0 %   Eosinophils Absolute 0.0 0.0 - 0.7 K/uL   Basophils Relative 0 %   Basophils Absolute 0.0 0.0 - 0.1 K/uL  Comprehensive metabolic panel  Result Value Ref Range   Sodium 136 135 - 145 mmol/L   Potassium 2.9 (L) 3.5 - 5.1 mmol/L   Chloride 108 101 - 111 mmol/L   CO2 18 (L) 22 - 32 mmol/L   Glucose, Bld 115 (H) 65 - 99 mg/dL   BUN 28 (H) 6 - 20 mg/dL   Creatinine, Ser 1.19 (H) 0.44 - 1.00 mg/dL   Calcium 8.3 (L) 8.9 - 10.3 mg/dL   Total Protein 6.0 (L) 6.5 - 8.1 g/dL   Albumin 1.6 (L) 3.5 - 5.0 g/dL   AST 14 (L) 15 - 41 U/L   ALT 15 14 - 54 U/L   Alkaline Phosphatase 163 (H) 38 - 126 U/L   Total Bilirubin 0.3 0.3 - 1.2 mg/dL   GFR calc non Af Amer 27 (L) >60 mL/min   GFR calc Af Amer 31 (L) >60 mL/min   Anion gap 10 5 - 15   Urinalysis, Routine w reflex microscopic (not at [facility])  Result Value Ref Range   Color, Urine YELLOW YELLOW   APPearance CLEAR CLEAR   Specific Gravity, Urine 1.010 1.005 - 1.030   pH 5.5 5.0 - 8.0   Glucose, UA NEGATIVE NEGATIVE mg/dL   Hgb urine dipstick TRACE (A) NEGATIVE   Bilirubin Urine NEGATIVE NEGATIVE   Ketones, ur NEGATIVE NEGATIVE mg/dL   Protein, ur TRACE (A) NEGATIVE mg/dL   Nitrite NEGATIVE NEGATIVE   Leukocytes, UA TRACE (A) NEGATIVE  Lipase, blood  Result Value Ref Range   Lipase 25 11 - 51 U/L  Troponin I  Result Value Ref Range   Troponin I 0.03 <0.031 ng/mL  Urine microscopic-add on  Result Value Ref Range   Squamous Epithelial / LPF 0-5 (A) NONE SEEN   WBC, UA 0-5 0 - 5 WBC/hpf   RBC / HPF 0-5 0 - 5 RBC/hpf   Bacteria, UA FEW (A) NONE SEEN   Casts GRANULAR CAST (A) NEGATIVE   Crystals URIC ACID CRYSTALS (A) NEGATIVE   Ct Abdomen Pelvis Wo Contrast 11/15/2015  CLINICAL DATA:  Abdominal pain, nausea and vomiting. Renal insufficiency. EXAM: CT ABDOMEN AND PELVIS WITHOUT CONTRAST TECHNIQUE: Multidetector CT imaging of the abdomen and pelvis was performed following the standard protocol without IV contrast. COMPARISON:  Renal sonogram on 09/09/2015 and CT of the pelvis on 08/07/2015. FINDINGS: Lower chest: Mild scarring/ atelectasis at both lung bases. No pleural effusions identified. Hepatobiliary: Unenhanced appearance of the liver and gallbladder is unremarkable. No biliary dilatation identified. Pancreas: Unenhanced appearance of the pancreas is unremarkable. Spleen: The spleen is normal in size and shows normal unenhanced appearance. Adrenals/Urinary Tract: Both kidneys are somewhat prominent in size and demonstrate perinephric stranding and edema. This is nonspecific but may reflect underlying parenchymal disease such as glomerulonephritis or infiltrative disease. Correlation suggested with trend of renal function. No hydronephrosis or urinary tract calculi  identified. The bladder appears unremarkable. Stomach/Bowel: Small hiatal hernia present. No evidence of bowel obstruction or inflammation. No free air or  abscess identified. Vascular/Lymphatic: No enlarged lymph nodes are seen. Reproductive: Postmenopausal uterus contains multiple calcifications, likely within degenerated fibroids. Calcification also associated with the right adnexa. Other: Complex midline ventral hernia present superior to the umbilicus. Multiple abdominal wall defects are identified with one hernia containing a short segment of the transverse colon without evidence of incarceration. Other abdominal wall hernia defects contain fat. Musculoskeletal: Spondylosis present at L5-S1. IMPRESSION: 1. Edematous appearance of the kidneys with bilateral perinephric stranding. This is nonspecific but may reflect underlying parenchymal renal disease. There is no evidence of hydronephrosis. 2. Complex midline ventral hernia with multiple adjacent abdominal wall defects. One of these defects contains a short segment of the transverse colon without evidence of bowel incarceration. Electronically Signed   By: Irish Lack M.D.   On: 11/15/2015 20:00   Dg Chest 2 View 11/15/2015  CLINICAL DATA:  Generalized abdominal pain, nausea, vomiting, diabetes mellitus, hypertension, smoker EXAM: CHEST  2 VIEW COMPARISON:  09/09/2015 FINDINGS: Upper normal heart size. Mediastinal contours and pulmonary vascularity normal. Prominent LEFT suprahilar vascular markings unchanged. No definite infiltrate, pleural effusion or pneumothorax. Bones demineralized with scattered endplate spur formation thoracic spine. IMPRESSION: No acute abnormalities. Electronically Signed   By: Ulyses Southward M.D.   On: 11/15/2015 17:28    Results for [name], [name] (MRN [id number]) as of 11/15/2015 20:21  Ref. Range 10/17/2015 09:09 10/18/2015 06:31 10/23/2015 00:12 10/23/2015 06:50 10/24/2015 10:40 10/25/2015 05:59 10/26/2015 06:18 11/15/2015 16:35   BUN Latest Ref Range: 6-20 mg/dL 77 (H) 74 (H) 56 (H) 55 (H) 46 (H) 37 (H) 34 (H) 28 (H)  Creatinine Latest Ref Range: 0.44-1.00 mg/dL 4.09 (H) 8.11 (H) 9.14 (H) 1.89 (H) 1.67 (H) 1.29 (H) 1.43 (H) 1.96 (H)   Results for [name], [name] (MRN [id number]) as of 11/15/2015 20:21  Ref. Range 10/17/2015 09:09 10/18/2015 06:31 10/23/2015 00:12 10/23/2015 06:50 10/24/2015 10:40 10/25/2015 05:59 10/26/2015 06:18 11/15/2015 16:35  Hemoglobin Latest Ref Range: 12.0-15.0 g/dL 7.8 (L) 7.4 (L) 7.0 (L) 6.9 (LL) 10.1 (L) 10.2 (L) 9.2 (L) 8.7 (L)  HCT Latest Ref Range: 36.0-46.0 % 24.2 (L) 23.0 (L) 20.9 (L) 21.3 (L) 30.0 (L) 30.4 (L) 28.9 (L) 25.5 (L)     2035:   Potassium repleted PO and IV. Pt has tol PO well while in the ED without N/V. No stooling while in the ED. Abd benign, VSS. Pt states she "feels better now."  BUN/Cr trending upwards and CT scan with non-specific bilateral perinephric stranding. T/C to [facility]Renal Dr. Arlean Hopping, case discussed, including:  HPI, pertinent PM/SHx, VS/PE, dx testing, ED course and treatment:  Agreeable to consult.  2120:  [facility] Dr. Arlean Hopping has evaluated pt in the ED: GFR c/w CKD, no acute indication for admission at this time, pt needs to f/u with Renal MD (she has not since her hospitalization in April). Dx and testing, as well as reiterated d/w [facility] MD, d/w pt.  Questions answered.  Verb understanding, agreeable to d/c home with outpt f/u.   Samuel Jester, DO 11/18/15 [phone]

## 2015-11-17 LAB — URINE CULTURE: CULTURE: NO GROWTH

## 2016-01-31 ENCOUNTER — Inpatient Hospital Stay (HOSPITAL_COMMUNITY)
Admission: EM | Admit: 2016-01-31 | Discharge: 2016-02-03 | DRG: 392 | Disposition: A | Discharge status: Home Health | Payer: BLUE CROSS/BLUE SHIELD | Attending: Oncology | Admitting: Oncology

## 2016-01-31 ENCOUNTER — Encounter (HOSPITAL_COMMUNITY): Payer: Self-pay

## 2016-01-31 DIAGNOSIS — A084 Viral intestinal infection, unspecified: Secondary | ICD-10-CM | POA: Diagnosis not present

## 2016-01-31 DIAGNOSIS — R1084 Generalized abdominal pain: Secondary | ICD-10-CM

## 2016-01-31 DIAGNOSIS — R109 Unspecified abdominal pain: Secondary | ICD-10-CM | POA: Diagnosis present

## 2016-01-31 DIAGNOSIS — I110 Hypertensive heart disease with heart failure: Secondary | ICD-10-CM | POA: Diagnosis present

## 2016-01-31 DIAGNOSIS — F329 Major depressive disorder, single episode, unspecified: Secondary | ICD-10-CM | POA: Diagnosis present

## 2016-01-31 DIAGNOSIS — E8770 Fluid overload, unspecified: Secondary | ICD-10-CM | POA: Diagnosis present

## 2016-01-31 DIAGNOSIS — R0600 Dyspnea, unspecified: Secondary | ICD-10-CM

## 2016-01-31 DIAGNOSIS — I420 Dilated cardiomyopathy: Secondary | ICD-10-CM | POA: Diagnosis present

## 2016-01-31 DIAGNOSIS — E876 Hypokalemia: Secondary | ICD-10-CM | POA: Diagnosis present

## 2016-01-31 DIAGNOSIS — E119 Type 2 diabetes mellitus without complications: Secondary | ICD-10-CM | POA: Diagnosis present

## 2016-01-31 DIAGNOSIS — Z8249 Family history of ischemic heart disease and other diseases of the circulatory system: Secondary | ICD-10-CM

## 2016-01-31 DIAGNOSIS — R000 Tachycardia, unspecified: Secondary | ICD-10-CM

## 2016-01-31 DIAGNOSIS — Z833 Family history of diabetes mellitus: Secondary | ICD-10-CM

## 2016-01-31 DIAGNOSIS — I5020 Unspecified systolic (congestive) heart failure: Secondary | ICD-10-CM | POA: Diagnosis present

## 2016-01-31 DIAGNOSIS — K219 Gastro-esophageal reflux disease without esophagitis: Secondary | ICD-10-CM | POA: Diagnosis present

## 2016-01-31 DIAGNOSIS — Z79899 Other long term (current) drug therapy: Secondary | ICD-10-CM

## 2016-01-31 DIAGNOSIS — Z7982 Long term (current) use of aspirin: Secondary | ICD-10-CM

## 2016-01-31 DIAGNOSIS — Z794 Long term (current) use of insulin: Secondary | ICD-10-CM

## 2016-01-31 DIAGNOSIS — I10 Essential (primary) hypertension: Secondary | ICD-10-CM | POA: Diagnosis present

## 2016-01-31 DIAGNOSIS — F17210 Nicotine dependence, cigarettes, uncomplicated: Secondary | ICD-10-CM | POA: Diagnosis present

## 2016-01-31 HISTORY — DX: Major depressive disorder, single episode, unspecified: F32.9

## 2016-01-31 HISTORY — DX: Depression, unspecified: F32.A

## 2016-01-31 HISTORY — DX: Pure hypercholesterolemia, unspecified: E78.00

## 2016-01-31 HISTORY — DX: Anxiety disorder, unspecified: F41.9

## 2016-01-31 HISTORY — DX: Gastro-esophageal reflux disease without esophagitis: K21.9

## 2016-01-31 HISTORY — DX: Type 2 diabetes mellitus without complications: E11.9

## 2016-01-31 LAB — COMPREHENSIVE METABOLIC PANEL
ALBUMIN: 3 g/dL — AB (ref 3.5–5.0)
ALT: 10 U/L — ABNORMAL LOW (ref 14–54)
ANION GAP: 10 (ref 5–15)
AST: 16 U/L (ref 15–41)
Alkaline Phosphatase: 120 U/L (ref 38–126)
BILIRUBIN TOTAL: 0.7 mg/dL (ref 0.3–1.2)
BUN: 8 mg/dL (ref 6–20)
CO2: 29 mmol/L (ref 22–32)
Calcium: 9 mg/dL (ref 8.9–10.3)
Chloride: 101 mmol/L (ref 101–111)
Creatinine, Ser: 1.11 mg/dL — ABNORMAL HIGH (ref 0.44–1.00)
GFR calc Af Amer: >60 mL/min (ref >60)
GFR, EST NON AFRICAN AMERICAN: 53 mL/min — AB (ref >60)
Glucose, Bld: 82 mg/dL (ref 65–99)
POTASSIUM: 2.6 mmol/L — AB (ref 3.5–5.1)
Sodium: 140 mmol/L (ref 135–145)
TOTAL PROTEIN: 8 g/dL (ref 6.5–8.1)

## 2016-01-31 LAB — CBC
HEMATOCRIT: 34.3 % — AB (ref 36.0–46.0)
HEMOGLOBIN: 10.7 g/dL — AB (ref 12.0–15.0)
MCH: 31.1 pg (ref 26.0–34.0)
MCHC: 31.2 g/dL (ref 30.0–36.0)
MCV: 99.7 fL (ref 78.0–100.0)
Platelets: 454 10*3/uL — ABNORMAL HIGH (ref 150–400)
RBC: 3.44 MIL/uL — ABNORMAL LOW (ref 3.87–5.11)
RDW: 15.7 % — ABNORMAL HIGH (ref 11.5–15.5)
WBC: 9 10*3/uL (ref 4.0–10.5)

## 2016-01-31 LAB — URINALYSIS, ROUTINE W REFLEX MICROSCOPIC
Bilirubin Urine: NEGATIVE
Glucose, UA: NEGATIVE mg/dL
Hgb urine dipstick: NEGATIVE
Ketones, ur: NEGATIVE mg/dL
LEUKOCYTES UA: NEGATIVE
NITRITE: NEGATIVE
PH: 6 (ref 5.0–8.0)
Protein, ur: NEGATIVE mg/dL
SPECIFIC GRAVITY, URINE: 1.015 (ref 1.005–1.030)

## 2016-01-31 LAB — LIPASE, BLOOD: LIPASE: 19 U/L (ref 11–51)

## 2016-01-31 MED ORDER — POTASSIUM CHLORIDE 10 MEQ/100ML IV SOLN
10.0000 meq | INTRAVENOUS | Status: AC
  Administered 2016-02-01 (×3): 10 meq via INTRAVENOUS
  Filled 2016-01-31 (×3): qty 100

## 2016-01-31 MED ORDER — SODIUM CHLORIDE 0.9 % IV BOLUS (SEPSIS)
1000.0000 mL | Freq: Once | INTRAVENOUS | Status: AC
  Administered 2016-02-01: 1000 mL via INTRAVENOUS

## 2016-01-31 MED ORDER — MORPHINE SULFATE (PF) 4 MG/ML IV SOLN
4.0000 mg | Freq: Once | INTRAVENOUS | Status: AC
  Administered 2016-02-01: 4 mg via INTRAVENOUS
  Filled 2016-01-31: qty 1

## 2016-01-31 MED ORDER — ONDANSETRON HCL 4 MG/2ML IJ SOLN
4.0000 mg | Freq: Once | INTRAMUSCULAR | Status: AC
  Administered 2016-02-01: 4 mg via INTRAVENOUS
  Filled 2016-01-31: qty 2

## 2016-01-31 NOTE — ED Notes (Signed)
CRITICAL VALUE ALERT  Critical value received:  Potassium  Date of notification:  01/31/16  Time of notification:  2310  Critical value read back: YES  Nurse who received alert:  Sharia ReeveLeigh Vinny Taranto, RN  MD notified (1st page):  Deretha EmoryZackowski  Time of first page: 2310

## 2016-01-31 NOTE — ED Provider Notes (Signed)
TIME SEEN: By signing my name below, I, Christel Mormon, attest that this documentation has been prepared under the direction and in the presence of Ardean Simonich N Chelcy Bolda, DO . Electronically Signed: Christel Mormon, Scribe. 01/31/2016. 11:59 PM.   CHIEF COMPLAINT:  Chief Complaint  Patient presents with  . Abdominal Pain     HPI Comments:  [name] is a 60 y.o. female with PMHx of DM and HTN and PSHx of Caesarian section who presents to the Emergency Department complaining of sudden onset, constant Sharp, crampy pain in her upper abdomen beginning at ~1PM today. Pt had an associated episode of both vomiting and diarrhea this afternoon. Pt complains of associated chills and problems with flatulence, states decreased. Pt notes 1 previous episode of abdominal pain and nausea several months ago for which she visited the ED.  States she is unsure what had caused pain in the past. States she has a history of peptic ulcer disease but is not sure if this feels the same. Denies any aggravating or relieving factors. No radiation of the pain. Pt denies fever, hematochezia, melena, dysuria, hematuria, vaginal discharge or bleeding, chest pain or SOB.   TAPPER,[name], MD: Appointment on 02/13/16   ROS: See HPI Constitutional: no fever  Eyes: no drainage  ENT: no runny nose   Cardiovascular:  no chest pain  Resp: no SOB  GI: vomiting GU: no dysuria Integumentary: no rash  Allergy: no hives  Musculoskeletal: no leg swelling  Neurological: no slurred speech ROS otherwise negative  PAST MEDICAL HISTORY/PAST SURGICAL HISTORY:  Past Medical History:  Diagnosis Date  . Diabetes mellitus without complication (HCC)   . Hypertension     MEDICATIONS:  Prior to Admission medications   Medication Sig Start Date End Date Taking? Authorizing Provider  albuterol (PROAIR HFA) 108 (90 Base) MCG/ACT inhaler Inhale 2 puffs into the lungs every 6 (six) hours as needed for wheezing or shortness of  breath.    Historical Provider, MD  amoxicillin (AMOXIL) 500 MG tablet Take 1 tablet (500 mg total) by mouth 2 (two) times daily. Patient not taking: Reported on 11/15/2015 10/26/15   Henderson Cloud, MD  aspirin EC 325 MG tablet Take 325 mg by mouth daily.    Historical Provider, MD  clobetasol cream (TEMOVATE) 0.05 % Apply topically 2 (two) times daily. 09/21/15   Leroy Sea, MD  FLUoxetine (PROZAC) 20 MG capsule Take 20 mg by mouth 2 (two) times daily.  05/11/15   Historical Provider, MD  HYDROmorphone (DILAUDID) 4 MG tablet Take 4 mg by mouth 2 (two) times daily as needed. For pain 11/09/15   Historical Provider, MD  insulin aspart (NOVOLOG FLEXPEN) 100 UNIT/ML FlexPen Inject 3 Units into the skin 3 (three) times daily with meals.    Historical Provider, MD  insulin glargine (LANTUS) 100 UNIT/ML injection Inject 0.18 mLs (18 Units total) into the skin 2 (two) times daily. Patient taking differently: Inject 18 Units into the skin at bedtime.  08/15/15   Leroy Sea, MD  metoprolol tartrate (LOPRESSOR) 25 MG tablet Take 1 tablet (25 mg total) by mouth 2 (two) times daily. 09/21/15   Leroy Sea, MD  Multiple Vitamin (MULTIVITAMIN WITH MINERALS) TABS tablet Take 1 tablet by mouth daily. 10/26/15   Henderson Cloud, MD  ondansetron (ZOFRAN) 4 MG tablet Take 1 tablet (4 mg total) by mouth every 8 (eight) hours as needed for nausea or vomiting. 11/15/15   Samuel Jester, DO  pravastatin (PRAVACHOL) 40 MG tablet Take 40 mg by mouth at bedtime.  05/11/15   Historical Provider, MD    ALLERGIES:  Allergies  Allergen Reactions  . Other     Per pt some type of mycin "some antibiotic sent me into renal failure."    SOCIAL HISTORY:  Social History  Substance Use Topics  . Smoking status: Current Some Day Smoker    Packs/day: 0.50    Types: Cigarettes  . Smokeless tobacco: Never Used  . Alcohol use Yes     Comment: occ.    FAMILY HISTORY: No family history on  file.  EXAM: BP (!) 193/101 (BP Location: Right Arm) Comment: Has not taken her BP meds this morning  Pulse 120   Temp 98.9 F (37.2 C) (Oral)   Resp 18   Ht 5\' 3"  (1.6 m)   Wt 230 lb (104.3 kg)   SpO2 95%   BMI 40.74 kg/m  CONSTITUTIONAL: Alert and oriented and responds appropriately to questions. Chronically ill-appearing, appears uncomfortable, moaning in pain HEAD: Normocephalic EYES: Conjunctivae clear, PERRL ENT: normal nose; no rhinorrhea; moist mucous membranes; no tympani and no fluid wave NECK: Supple, no meningismus, no LAD  CARD: RRR; S1 and S2 appreciated; no murmurs, no clicks, no rubs, no gallops RESP: Normal chest excursion without splinting or tachypnea; breath sounds clear and equal bilaterally; no wheezes, no rhonchi, no rales, no hypoxia or respiratory distress, speaking full sentences ABD/GI: Normal bowel sounds; non-distended; soft, no rebound, no peritoneal signs; diffusely tender throughout abdomen, especially in upper abdomen; voluntary guarding BACK:  The back appears normal and is non-tender to palpation, there is no CVA tenderness EXT: Normal ROM in all joints; non-tender to palpation; no edema; normal capillary refill; no cyanosis, no calf tenderness or swelling about 2+ radial and DP pulses bilaterally    SKIN: Normal color for age and race; warm; no rash NEURO: Moves all extremities equally, sensation to light touch intact diffusely, cranial nerves II through XII intact PSYCH: The patient's mood and manner are appropriate. Grooming and personal hygiene are appropriate.  MEDICAL DECISION MAKING: Patient here with complaints of abdominal pain. Tender to palpation throughout the upper abdomen especially but is diffusely tender with some guarding. Labs ordered in triage of been unremarkable except for hypokalemia. Given she has vomiting, will give IV potassium. EKG shows no abnormality. We'll start with an abdominal x-ray to evaluate for possible perforation  given she has a history of peptic ulcer disease and appears free uncomfortable. Denies chest pain, shortness of breath, back pain. Doubt dissection. She has equal pulses in all extremities.  ED PROGRESS:    2:45 AM  I feel pt needs a CTAP.  She is still appears very comfortable and is diffusely tender throughout the abdomen. She is unsure if this feels like her prior peptic ulcers or not. X-ray does not show any perforation or bowel obstruction. Discussed with her that this could be gastritis, peptic ulcer but given she is still very tender to palpation, moaning in pain and tachycardia, I feel she needs to be evaluated further with a CT of her abdomen and pelvis. Oral temperature is 99 and she is warm to touch. Given she is tachycardic will obtain a rectal temperature, lactate and blood cultures to ensure that this is not sepsis. At this time we are unable perform this at [facility] and therefore she'll be transferred to [facility] in by ambulance for CT of her abdomen and pelvis. She is  comfortable with this plan. She is aware that if this is normal she may be discharged from the emergency department.  D/w Dr. Mora Bellmanni in the [facility] department he agrees to accept the patient in transfer.   3:30 AM  Pt's troponin is negative. Lactate normal. Rectal temperature normal. I feel this is less likely sepsis. I do not feel at this time she needs broad-spectrum antibiotics.  Stable for transfer to [facility].  Disposition per CT scan. Patient is aware that if her CT is negative, she may be discharged home with outpt follow up.   EKG Interpretation  Date/Time:  Wednesday January 31 2016 22:08:13 EDT Ventricular Rate:  119 PR Interval:    QRS Duration: 142 QT Interval:  388 QTC Calculation: 546 R Axis:   -108 Text Interpretation:  Sinus or ectopic atrial tachycardia Consider dextrocardia No significant change since last tracing Confirmed by Abayomi Pattison,  DO, Natalin Bible [id number]) on 01/31/2016 11:16:53 PM         I personally performed the services described in this documentation, which was scribed in my presence. The recorded information has been reviewed and is accurate.     Layla MawKristen N Nani Ingram, DO 02/01/16 [phone]

## 2016-01-31 NOTE — ED Triage Notes (Signed)
Reports of nausea, vomiting, diarrhea with abdominal pain that started today at 1300. PAtient has wound in right groin with dressing applied. Has had wound since March per patient. Denies fever.

## 2016-02-01 ENCOUNTER — Encounter (HOSPITAL_COMMUNITY): Payer: Self-pay | Admitting: General Practice

## 2016-02-01 ENCOUNTER — Emergency Department (HOSPITAL_COMMUNITY): Payer: BLUE CROSS/BLUE SHIELD

## 2016-02-01 DIAGNOSIS — E119 Type 2 diabetes mellitus without complications: Secondary | ICD-10-CM

## 2016-02-01 DIAGNOSIS — Z79899 Other long term (current) drug therapy: Secondary | ICD-10-CM

## 2016-02-01 DIAGNOSIS — R1013 Epigastric pain: Secondary | ICD-10-CM

## 2016-02-01 DIAGNOSIS — I420 Dilated cardiomyopathy: Secondary | ICD-10-CM | POA: Diagnosis present

## 2016-02-01 DIAGNOSIS — E876 Hypokalemia: Secondary | ICD-10-CM | POA: Diagnosis present

## 2016-02-01 DIAGNOSIS — Z794 Long term (current) use of insulin: Secondary | ICD-10-CM

## 2016-02-01 DIAGNOSIS — E8770 Fluid overload, unspecified: Secondary | ICD-10-CM

## 2016-02-01 DIAGNOSIS — K219 Gastro-esophageal reflux disease without esophagitis: Secondary | ICD-10-CM | POA: Diagnosis present

## 2016-02-01 DIAGNOSIS — A084 Viral intestinal infection, unspecified: Secondary | ICD-10-CM | POA: Diagnosis present

## 2016-02-01 DIAGNOSIS — F329 Major depressive disorder, single episode, unspecified: Secondary | ICD-10-CM | POA: Diagnosis present

## 2016-02-01 DIAGNOSIS — F17210 Nicotine dependence, cigarettes, uncomplicated: Secondary | ICD-10-CM | POA: Diagnosis present

## 2016-02-01 DIAGNOSIS — I10 Essential (primary) hypertension: Secondary | ICD-10-CM

## 2016-02-01 DIAGNOSIS — R109 Unspecified abdominal pain: Secondary | ICD-10-CM | POA: Diagnosis present

## 2016-02-01 DIAGNOSIS — Z833 Family history of diabetes mellitus: Secondary | ICD-10-CM | POA: Diagnosis not present

## 2016-02-01 DIAGNOSIS — Z8249 Family history of ischemic heart disease and other diseases of the circulatory system: Secondary | ICD-10-CM | POA: Diagnosis not present

## 2016-02-01 DIAGNOSIS — I5020 Unspecified systolic (congestive) heart failure: Secondary | ICD-10-CM | POA: Diagnosis present

## 2016-02-01 DIAGNOSIS — I110 Hypertensive heart disease with heart failure: Secondary | ICD-10-CM | POA: Diagnosis present

## 2016-02-01 DIAGNOSIS — Z7982 Long term (current) use of aspirin: Secondary | ICD-10-CM | POA: Diagnosis not present

## 2016-02-01 LAB — I-STAT CHEM 8, ED
BUN: 5 mg/dL — ABNORMAL LOW (ref 6–20)
CALCIUM ION: 1.1 mmol/L — AB (ref 1.12–1.23)
CREATININE: 1 mg/dL (ref 0.44–1.00)
Chloride: 100 mmol/L — ABNORMAL LOW (ref 101–111)
GLUCOSE: 128 mg/dL — AB (ref 65–99)
HCT: 36 % (ref 36.0–46.0)
HEMOGLOBIN: 12.2 g/dL (ref 12.0–15.0)
POTASSIUM: 3.1 mmol/L — AB (ref 3.5–5.1)
Sodium: 144 mmol/L (ref 135–145)
TCO2: 30 mmol/L (ref 0–100)

## 2016-02-01 LAB — LACTIC ACID, PLASMA
LACTIC ACID, VENOUS: 1.5 mmol/L (ref 0.5–1.9)
LACTIC ACID, VENOUS: 1.3 mmol/L (ref 0.5–1.9)

## 2016-02-01 LAB — MAGNESIUM: Magnesium: 1.6 mg/dL — ABNORMAL LOW (ref 1.7–2.4)

## 2016-02-01 LAB — GLUCOSE, CAPILLARY
GLUCOSE-CAPILLARY: 168 mg/dL — AB (ref 65–99)
Glucose-Capillary: 172 mg/dL — ABNORMAL HIGH (ref 65–99)
Glucose-Capillary: 162 mg/dL — ABNORMAL HIGH (ref 65–99)

## 2016-02-01 LAB — BRAIN NATRIURETIC PEPTIDE: B Natriuretic Peptide: 1090.5 pg/mL — ABNORMAL HIGH (ref 0.0–100.0)

## 2016-02-01 LAB — TROPONIN I: Troponin I: 0.03 ng/mL (ref <0.03)

## 2016-02-01 MED ORDER — ADULT MULTIVITAMIN W/MINERALS CH
1.0000 | ORAL_TABLET | Freq: Every day | ORAL | Status: DC
  Administered 2016-02-01 – 2016-02-03 (×3): 1 via ORAL
  Filled 2016-02-01 (×3): qty 1

## 2016-02-01 MED ORDER — INSULIN ASPART 100 UNIT/ML ~~LOC~~ SOLN
0.0000 [IU] | Freq: Three times a day (TID) | SUBCUTANEOUS | Status: DC
Start: 2016-02-01 — End: 2016-02-03
  Administered 2016-02-01 (×2): 2 [IU] via SUBCUTANEOUS
  Administered 2016-02-03: 1 [IU] via SUBCUTANEOUS
  Administered 2016-02-03: 2 [IU] via SUBCUTANEOUS

## 2016-02-01 MED ORDER — FUROSEMIDE 10 MG/ML IJ SOLN
40.0000 mg | Freq: Once | INTRAMUSCULAR | Status: AC
  Administered 2016-02-01: 40 mg via INTRAVENOUS
  Filled 2016-02-01: qty 4

## 2016-02-01 MED ORDER — ENOXAPARIN SODIUM 40 MG/0.4ML ~~LOC~~ SOLN
40.0000 mg | SUBCUTANEOUS | Status: DC
  Administered 2016-02-01 – 2016-02-03 (×3): 40 mg via SUBCUTANEOUS
  Filled 2016-02-01 (×3): qty 0.4

## 2016-02-01 MED ORDER — HYDROMORPHONE HCL 2 MG PO TABS: 4.0000 mg | ORAL_TABLET | Freq: Four times a day (QID) | ORAL | Status: DC | PRN

## 2016-02-01 MED ORDER — POTASSIUM CHLORIDE CRYS ER 20 MEQ PO TBCR
40.0000 meq | EXTENDED_RELEASE_TABLET | Freq: Two times a day (BID) | ORAL | Status: AC
  Administered 2016-02-01 (×2): 40 meq via ORAL
  Filled 2016-02-01 (×2): qty 2

## 2016-02-01 MED ORDER — MORPHINE SULFATE (PF) 4 MG/ML IV SOLN
4.0000 mg | Freq: Once | INTRAVENOUS | Status: AC
  Administered 2016-02-01: 4 mg via INTRAVENOUS
  Filled 2016-02-01: qty 1

## 2016-02-01 MED ORDER — MAGNESIUM SULFATE 2 GM/50ML IV SOLN
2.0000 g | Freq: Once | INTRAVENOUS | Status: AC
  Administered 2016-02-01: 2 g via INTRAVENOUS
  Filled 2016-02-01: qty 50

## 2016-02-01 MED ORDER — LABETALOL HCL 5 MG/ML IV SOLN
10.0000 mg | Freq: Once | INTRAVENOUS | Status: AC
  Administered 2016-02-01: 10 mg via INTRAVENOUS
  Filled 2016-02-01: qty 4

## 2016-02-01 MED ORDER — IOPAMIDOL (ISOVUE-300) INJECTION 61%
INTRAVENOUS | Status: AC
  Administered 2016-02-01: 100 mL
  Filled 2016-02-01: qty 100

## 2016-02-01 MED ORDER — ASPIRIN EC 325 MG PO TBEC
325.0000 mg | DELAYED_RELEASE_TABLET | Freq: Every day | ORAL | Status: DC
  Administered 2016-02-01 – 2016-02-03 (×3): 325 mg via ORAL
  Filled 2016-02-01 (×3): qty 1

## 2016-02-01 MED ORDER — POTASSIUM CHLORIDE CRYS ER 20 MEQ PO TBCR: 40.0000 meq | EXTENDED_RELEASE_TABLET | Freq: Two times a day (BID) | ORAL | Status: DC

## 2016-02-01 MED ORDER — GI COCKTAIL ~~LOC~~
30.0000 mL | Freq: Once | ORAL | Status: AC
  Administered 2016-02-01: 30 mL via ORAL
  Filled 2016-02-01: qty 30

## 2016-02-01 MED ORDER — TRAZODONE HCL 50 MG PO TABS
50.0000 mg | ORAL_TABLET | Freq: Every day | ORAL | Status: DC
  Administered 2016-02-01 – 2016-02-02 (×2): 50 mg via ORAL
  Filled 2016-02-01 (×2): qty 1

## 2016-02-01 MED ORDER — ACETAMINOPHEN 650 MG RE SUPP: 650.0000 mg | Freq: Four times a day (QID) | RECTAL | Status: DC | PRN

## 2016-02-01 MED ORDER — GI COCKTAIL ~~LOC~~: 30.0000 mL | Freq: Three times a day (TID) | ORAL | Status: DC | PRN

## 2016-02-01 MED ORDER — PRAVASTATIN SODIUM 40 MG PO TABS
40.0000 mg | ORAL_TABLET | Freq: Every day | ORAL | Status: DC
  Administered 2016-02-01 – 2016-02-02 (×2): 40 mg via ORAL
  Filled 2016-02-01 (×2): qty 1

## 2016-02-01 MED ORDER — ALBUTEROL SULFATE (2.5 MG/3ML) 0.083% IN NEBU: 2.5000 mg | INHALATION_SOLUTION | Freq: Four times a day (QID) | RESPIRATORY_TRACT | Status: DC | PRN

## 2016-02-01 MED ORDER — INSULIN GLARGINE 100 UNIT/ML ~~LOC~~ SOLN
10.0000 [IU] | Freq: Every day | SUBCUTANEOUS | Status: DC
  Administered 2016-02-01 – 2016-02-02 (×2): 10 [IU] via SUBCUTANEOUS
  Filled 2016-02-01 (×3): qty 0.1

## 2016-02-01 MED ORDER — ACETAMINOPHEN 325 MG PO TABS
650.0000 mg | ORAL_TABLET | Freq: Four times a day (QID) | ORAL | Status: DC | PRN
  Administered 2016-02-02: 650 mg via ORAL
  Filled 2016-02-01: qty 2

## 2016-02-01 MED ORDER — METOPROLOL TARTRATE 25 MG PO TABS
25.0000 mg | ORAL_TABLET | Freq: Two times a day (BID) | ORAL | Status: DC
  Administered 2016-02-01 – 2016-02-03 (×5): 25 mg via ORAL
  Filled 2016-02-01 (×5): qty 1

## 2016-02-01 MED ORDER — SODIUM CHLORIDE 0.9 % IV BOLUS (SEPSIS): 1000.0000 mL | Freq: Once | INTRAVENOUS | Status: DC

## 2016-02-01 MED ORDER — PANTOPRAZOLE SODIUM 40 MG PO TBEC
40.0000 mg | DELAYED_RELEASE_TABLET | Freq: Every day | ORAL | Status: DC
  Administered 2016-02-02 – 2016-02-03 (×2): 40 mg via ORAL
  Filled 2016-02-01 (×2): qty 1

## 2016-02-01 MED ORDER — FLUOXETINE HCL 20 MG PO CAPS
20.0000 mg | ORAL_CAPSULE | Freq: Two times a day (BID) | ORAL | Status: DC
  Administered 2016-02-01 – 2016-02-03 (×5): 20 mg via ORAL
  Filled 2016-02-01 (×5): qty 1

## 2016-02-01 MED ORDER — PANTOPRAZOLE SODIUM 40 MG PO TBEC
40.0000 mg | DELAYED_RELEASE_TABLET | Freq: Once | ORAL | Status: AC
  Administered 2016-02-01: 40 mg via ORAL
  Filled 2016-02-01: qty 1

## 2016-02-01 NOTE — ED Provider Notes (Signed)
Patient transferred from [facility] emergency department for further evaluation of abdominal pain. Patient seen with upper abdominal pain that had started this afternoon and has been continuous. She had an episode of vomiting and diarrhea this morning and continues to have pain. Patient was found to be hypokalemic, has been administered IV potassium. Patient transferred for CAT scan, as this is not available at [facility] tonight.  Face to face Exam: HEENT - PERRLA Lungs - CTAB Heart - RRR, no M/R/G Abd - slightly distended, bowel sounds decreased, epigastric tenderness, no guarding or rebound Neuro - alert, oriented x3  Plan: CT scan abdomen and pelvis performed. No acute abdominal pathology noted. Patient does, however, have evidence of volume overload. She has acute edema of the abdominal wall as well as evidence of fluid in her lungs on x-ray and CT scan. Patient is not currently on diuretic therapy. She will require hospitalization for further management.   Gilda Creasehristopher J Pollina, MD 02/01/16 [phone]

## 2016-02-01 NOTE — ED Notes (Signed)
Pt reports R shoulder pain- she states she has osteoarthritis and has knotty finger joints- Denies having a Rheumotologist, and states she takes alieve for her discomfort. She denies any injury. Her R shoulder is tender to the touch and she is using it gingerly

## 2016-02-01 NOTE — ED Notes (Signed)
Upon hooking pt back up to cardiac monitor when returning from CT, pt HR noted to be in upper 120's. Repeat EKG obtained and given to [facility]

## 2016-02-01 NOTE — H&P (Signed)
Date: 02/01/2016               Patient Name:  [name] MRN: [id number]  DOB: [birth year] Age / Sex: 60 y.o., female   PCP: Oley Balmavid B. Margo Commonapper, MD         Medical Service: Internal Medicine Teaching Service         Attending Physician: Dr. Levert FeinsteinJames M Granfortuna, MD    First Contact: Dr. Carolynn CommentBryan Strelow Pager: [phone]  Second Contact: Dr. Heywood Ilesushil Patel Pager: [phone]       After Hours (After 5p/  First Contact Pager: [phone]  weekends / holidays): Second Contact Pager: [phone]   Chief Complaint: abd pain  History of Present Illness: 52F w/ PMHx of DM, depression, and HTN who presents with abdominal pain that started yesterday around 3:00pm. She had a few episodes of non bilious, non bloody emesis with some relief in pain. Abd pain was located at her midepigastric region below her sternum and was non radiating. Pain was 8/10 in severity. She was [facility] for CT abd/pelvis which was significant for b/l pleural effusions and whole body wall edema. She has an ECHO on 09/19/15 w/ nl EF and grade 1 diastolic dysfunction although the study was limited due to patient vomiting. She endorses weight gain, she was previously admitted to [facility] from 5/14-18 for a right axilla abscess and her weight on 5/15 was 185lbs vs her weight today which is 204lbs. She denies orthopnea and leg swelling. She denies hx of GERD but endorses considerable amount of stress due to family and finances over the past 1 week. She does not drink much coffee or sodas nor does she eat fried foods. She smokes 2 cigarettes per day on and off for the past 20 years, she previously would drink a glass of wine nightly but no longer drinks alcohol.   In the ED she was given 40mg  of IV lasix and potassium supplementation for a potassium level of 2.6.   Meds:  Current Meds  Medication Sig  . albuterol (PROAIR HFA) 108 (90 Base) MCG/ACT inhaler Inhale 2 puffs into the lungs  every 6 (six) hours as needed for wheezing or shortness of breath.  Marland Kitchen. aspirin EC 325 MG tablet Take 325 mg by mouth daily.  . clobetasol cream (TEMOVATE) 0.05 % Apply topically 2 (two) times daily.  Marland Kitchen. FLUoxetine (PROZAC) 20 MG capsule Take 20 mg by mouth 2 (two) times daily.   Marland Kitchen. HYDROmorphone (DILAUDID) 4 MG tablet Take 4 mg by mouth 2 (two) times daily as needed. For pain  . insulin aspart (NOVOLOG FLEXPEN) 100 UNIT/ML FlexPen Inject 3 Units into the skin 3 (three) times daily with meals.  . insulin glargine (LANTUS) 100 UNIT/ML injection Inject 0.18 mLs (18 Units total) into the skin 2 (two) times daily. (Patient taking differently: Inject 18 Units into the skin at bedtime. )  . metoprolol tartrate (LOPRESSOR) 25 MG tablet Take 1 tablet (25 mg total) by mouth 2 (two) times daily.  . Multiple Vitamin (MULTIVITAMIN WITH MINERALS) TABS tablet Take 1 tablet by mouth daily.  . pravastatin (PRAVACHOL) 40 MG tablet Take 40 mg by mouth at bedtime.      Allergies: Allergies as of 01/31/2016 - Review Complete 01/31/2016  Allergen Reaction Noted  . Other  09/27/2015   Past Medical History:  Diagnosis Date  . Diabetes mellitus without complication (HCC)   . Hypertension     Family History:  mom, dad, sister: htn. Dad: dm.   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   Review of Systems: A complete ROS was negative except as per HPI.  Denies dysuria, diarrhea, fevers, abd bloating, and chills.  Positive for weight gain.   Physical Exam: Blood pressure (!) 187/102, pulse (!) 122, temperature 98 F (36.7 C), temperature source Rectal, resp. rate 13, height 5\' 3"  (1.6 m), weight 230 lb (104.3 kg), SpO2 96 %. Physical Exam  Constitutional: She appears well-developed and well-nourished. No distress.  Cardiovascular: Normal rate, regular rhythm and normal heart sounds.  Exam reveals no gallop and no friction rub.   No murmur heard. Pulmonary/Chest: Effort normal and breath  sounds normal. No respiratory distress. She has no wheezes. She has no rales. She exhibits no tenderness.  Abdominal: Soft. Bowel sounds are normal. She exhibits no distension and no mass. There is tenderness (mild epigastric tenderness). There is no rebound and no guarding.  Musculoskeletal: She exhibits no edema.  Skin: Skin is warm and dry. No rash noted. She is not diaphoretic. No erythema. No pallor.  Rt axilla dress dry, abscess site healing well     EKG Interpretation  Date/Time:  Thursday February 01 2016 06:28:05 EDT Ventricular Rate:  125 PR Interval:    QRS Duration: 140 QT Interval:  394 QTC Calculation: 569 R Axis:   -103 Text Interpretation:  Sinus or ectopic atrial tachycardia. No significant change since last tracing Confirmed by [facility]  MD, CHRISTOPHER [phone]) on 02/01/2016 6:37:37 AM  XRay chest/abd--1. No evidence of bowel obstruction or free air. 2. Cardiomegaly with vascular congestion, question fluid overload.  CT abd/pelvis-- 1. Fluid overload. Bilateral pleural effusions and whole body wall edema. 2. Symmetric bilateral perinephric edema is nonspecific, however unchanged from prior CT. Question underlying renal disease or pyelonephritis. 3. Focus of intravesicular air is likely secondary to instrumentation. Urinary tract infection could produce this appearance. 4. Ventral abdominal wall hernias, of which contains nonobstructed normal transverse colon. Additional hernias contain fat. 5. Atherosclerosis without aneurysm.  Assessment & Plan by Problem: Active Problems:   Abdominal pain  Abdominal Pain-- unclear etiology, CT abd/pelvis negative for obstruction or infectious etiology. Likely due to GERD due to location of pain and increased stress vs bowel edema. UA negative for infection.  - admit to tele - GI cocktails TID prn for pain  - start on protonix 40mg  daily - follow urine culture  Volume overload-- CT abd/pelvis revealed whole wall body edema.  She had known grade 1 diastolic dysfunction on ECHO 09/2015. Possible that she has worsening of diastolic function. She has had significant weight gain since last admission. She was given IV lasix 40mg  in the ED.  - daily weights, strict I/Os - she is lasix naive, will continue IV lasix 40mg  daily.  - BMET in the morning - ECHO - BNP added on to lab which came back elevated at 1090.5  Hypokalemia-- K 2.6->3.1 s/p 4 runs of KCl. Will give Kdur BID today - BMET in the am - mag added on to lab-- low at 1.6, will replete with 2g of IV mag  DM-- on lantus 18 units qhs, farxiga, and novolog 3 units TID at home - lantus 10 units qhs - SSI  HTN-- continue home lopressor 25mg  BID Depression-- cont prozac 20mg  qhs DVT ppx- lovenox Diet: hh/carb mod   Dispo: Admit patient to Inpatient with expected length of stay greater than 2 midnights.  Signed: Denton Brick, MD 02/01/2016,  8:23 AM  Pager: [phone]

## 2016-02-02 ENCOUNTER — Other Ambulatory Visit (HOSPITAL_COMMUNITY): Payer: Self-pay

## 2016-02-02 ENCOUNTER — Inpatient Hospital Stay (HOSPITAL_COMMUNITY): Payer: BLUE CROSS/BLUE SHIELD

## 2016-02-02 DIAGNOSIS — E8770 Fluid overload, unspecified: Secondary | ICD-10-CM | POA: Diagnosis present

## 2016-02-02 LAB — BASIC METABOLIC PANEL
Anion gap: 9 (ref 5–15)
BUN: 5 mg/dL — AB (ref 6–20)
CHLORIDE: 99 mmol/L — AB (ref 101–111)
CO2: 33 mmol/L — AB (ref 22–32)
CREATININE: 1.1 mg/dL — AB (ref 0.44–1.00)
Calcium: 9.1 mg/dL (ref 8.9–10.3)
GFR calc Af Amer: >60 mL/min (ref >60)
GFR calc non Af Amer: 53 mL/min — ABNORMAL LOW (ref >60)
Glucose, Bld: 113 mg/dL — ABNORMAL HIGH (ref 65–99)
POTASSIUM: 3.1 mmol/L — AB (ref 3.5–5.1)
Sodium: 141 mmol/L (ref 135–145)

## 2016-02-02 LAB — MAGNESIUM: Magnesium: 2.4 mg/dL (ref 1.7–2.4)

## 2016-02-02 LAB — GLUCOSE, CAPILLARY
GLUCOSE-CAPILLARY: 114 mg/dL — AB (ref 65–99)
Glucose-Capillary: 102 mg/dL — ABNORMAL HIGH (ref 65–99)
Glucose-Capillary: 115 mg/dL — ABNORMAL HIGH (ref 65–99)
Glucose-Capillary: 167 mg/dL — ABNORMAL HIGH (ref 65–99)

## 2016-02-02 LAB — TROPONIN I: Troponin I: 0.03 ng/mL

## 2016-02-02 MED ORDER — ONDANSETRON HCL 4 MG/2ML IJ SOLN
4.0000 mg | Freq: Three times a day (TID) | INTRAMUSCULAR | Status: DC | PRN
Start: 2016-02-02 — End: 2016-02-03

## 2016-02-02 MED ORDER — ONDANSETRON HCL 4 MG/2ML IJ SOLN
4.0000 mg | Freq: Three times a day (TID) | INTRAMUSCULAR | Status: DC | PRN
  Administered 2016-02-02: 4 mg via INTRAVENOUS
  Filled 2016-02-02: qty 2

## 2016-02-02 MED ORDER — LISINOPRIL 20 MG PO TABS
20.0000 mg | ORAL_TABLET | Freq: Every day | ORAL | Status: DC
Start: 2016-02-02 — End: 2016-02-03
  Administered 2016-02-02 – 2016-02-03 (×2): 20 mg via ORAL
  Filled 2016-02-02 (×3): qty 1

## 2016-02-02 MED ORDER — ONDANSETRON HCL 4 MG PO TABS: 4.0000 mg | ORAL_TABLET | Freq: Three times a day (TID) | ORAL | Status: DC | PRN

## 2016-02-02 MED ORDER — HYDROCHLOROTHIAZIDE 25 MG PO TABS
25.0000 mg | ORAL_TABLET | Freq: Every day | ORAL | Status: DC
  Administered 2016-02-02 – 2016-02-03 (×2): 25 mg via ORAL
  Filled 2016-02-02 (×3): qty 1

## 2016-02-02 MED ORDER — POTASSIUM CHLORIDE CRYS ER 20 MEQ PO TBCR
40.0000 meq | EXTENDED_RELEASE_TABLET | Freq: Two times a day (BID) | ORAL | Status: AC
Start: 2016-02-02 — End: 2016-02-02
  Administered 2016-02-02 (×2): 40 meq via ORAL
  Filled 2016-02-02 (×2): qty 2

## 2016-02-02 MED ORDER — COLLAGENASE 250 UNIT/GM EX OINT
TOPICAL_OINTMENT | Freq: Every day | CUTANEOUS | Status: DC
  Administered 2016-02-03 (×2): via TOPICAL
  Filled 2016-02-02: qty 30

## 2016-02-02 NOTE — Consult Note (Addendum)
WOC Nurse wound consult note Reason for Consult: Wound care suggestions for left achilles and right groin wounds Wound type: Left achilles is a full thickness wound (pt states from high top shoes) Right groin is a large partial thickness wound from an area healing after a patient had a boil. Pressure Ulcer POA: No (pt states the wound on her achilles that she came in with was from her shoes) Measurement: Left achilles 1.25cm x 2.5cm x 0.02cm                          Right groin is 5cm x 17cm x 0cm Wound QMV:HQIObed:Left achilles 50% yellow 50% pale red granulation                     Right groin is 75% pink (healed area where skin pigment not returned, 25% red granulation tissue Drainage (amount, consistency, odor) Both wounds have moderate amt of malodorous, thick, yellow drainage Periwound: intact at both sites Dressing procedure/placement/frequency: I have provided nurses with orders for cleansing and applying xeroform gauze to both sites. We will not follow, but will remain available to this patient, to nursing, and the medical teams.  Please re-consult if we need to assist further.

## 2016-02-02 NOTE — Progress Notes (Signed)
CRITICAL VALUE ALERT  Critical value received: Positive troponin  Date of notification:  02/02/2016  Time of notification:  0627  Critical value read back: yes  Nurse who received alert:  Scot DockKara Emika Tiano, RN  MD notified: Dr. Laural BenesJohnson (on-call intern)  Time of first page:  [phone]  Responding MD:  Dr. Laural BenesJohnson   Time MD responded: 0631  No new orders!!

## 2016-02-02 NOTE — Progress Notes (Signed)
Subjective: Currently, the patient is feeling well. Denies further vomiting, only mild nausea, abd pain improved.  Interval Events: Echo scheduled today.  Objective: Vital signs in last 24 hours: Vitals:   02/01/16 1525 02/01/16 2048 02/01/16 2217 02/02/16 0622  BP: (!) 170/83 (!) 150/83 138/73 (!) 197/100  Pulse: 87 88 75 85  Resp: 16 16 18 20   Temp: 99 F (37.2 C) 98.7 F (37.1 C) 97.6 F (36.4 C) 98.4 F (36.9 C)  TempSrc: Oral Oral  Oral  SpO2: 95% (!) 89% 100% 97%  Weight:      Height:       24-hour weight change: Filed Weights   02/01/16 0908 02/01/16 1002 02/02/16 0655  Weight: 204 lb (92.5 kg) 208 lb 6.4 oz (94.5 kg) 202 lb 4.8 oz (91.8 kg)   Intake/Output:  08/24 0701 - 08/25 0700 In: 240 [P.O.:240] Out: 1650 [Urine:1650]    Physical Exam: Physical Exam  Constitutional: She appears well-developed. She is cooperative. No distress.  Eyes: Pupils are equal, round, and reactive to light.  Cardiovascular: Normal rate, regular rhythm, normal heart sounds and normal pulses.  Exam reveals no gallop.   No murmur heard. Pulmonary/Chest: Effort normal. No respiratory distress. She has rales (bibasilar, fine, minimal). Breasts are symmetrical.  Abdominal: Soft. Bowel sounds are normal. There is no tenderness.  Musculoskeletal: She exhibits no edema.   Labs: CBC:  Recent Labs Lab 01/31/16 2236 02/01/16 0442  WBC 9.0  --   HGB 10.7* 12.2  HCT 34.3* 36.0  MCV 99.7  --   PLT 454*  --    Metabolic Panel:  Recent Labs Lab 01/31/16 2236 02/01/16 0442 02/01/16 0830 02/02/16 0515  NA 140 144  --  141  K 2.6* 3.1*  --  3.1*  CL 101 100*  --  99*  CO2 29  --   --  33*  GLUCOSE 82 128*  --  113*  BUN 8 5*  --  5*  CREATININE 1.11* 1.00  --  1.10*  CALCIUM 9.0  --   --  9.1  MG  --   --  1.6* 2.4  ALT 10*  --   --   --   ALKPHOS 120  --   --   --   BILITOT 0.7  --   --   --   PROT 8.0  --   --   --   ALBUMIN 3.0*  --   --   --   LIPASE 19  --   --    --    Cardiac Labs:  Recent Labs Lab 02/01/16 0222 02/01/16 0844 02/02/16 0515  TROPONINI 0.03*  --  0.03*  BNP  --  1,090.5*  --    BG:  Recent Labs Lab 02/01/16 1203 02/01/16 1800 02/01/16 2222  GLUCAP 172* 162* 168*   Lab Results  Component Value Date   HGBA1C 9.7 (H) 09/09/2015   Microbiology: UCx - pending  Imaging: Echo - pending   Medications: Infusions:   Scheduled Medications: . aspirin EC  325 mg Oral Daily  . enoxaparin (LOVENOX) injection  40 mg Subcutaneous Q24H  . FLUoxetine  20 mg Oral BID  . insulin aspart  0-9 Units Subcutaneous TID WC  . insulin glargine  10 Units Subcutaneous QHS  . metoprolol tartrate  25 mg Oral BID  . multivitamin with minerals  1 tablet Oral Daily  . pantoprazole  40 mg Oral Daily  . pravastatin  40 mg Oral  QHS  . traZODone  50 mg Oral QHS   PRN Medications: acetaminophen **OR** acetaminophen, albuterol, gi cocktail, HYDROmorphone  Assessment/Plan: Pt is a 60 y.o. yo female with a PMHx of DM, HTN, depression who was admitted on 01/31/2016 with symptoms of N/V though to be 2/2 viral infection, was found to have significant pulmonary and body wall edema. Interventions at this time will be focused on evaluation for heart failure and diuresis.   Abdominal Pain-- likely viral, N/V now resolved. CT abd/pelvis negative for obstruction or bacterial infection. Potentially contr by GERD due to location of pain and increased stress vs bowel edema. - GI cocktails TID prn for pain  - protonix 40mg  daily  Volume overload-- CT abd/pelvis revealed whole wall body edema. She had known grade 1 diastolic dysfunction on ECHO 09/2015 (? Poor exam quality). Has had significant weight gain (up to 208) since last admission (from 185), but pt reports normal wt around 200. BNP 1090. S/p 80mg  IV Lasix. Wt now 202 - daily weights, strict I/Os - continue IV lasix 40mg  daily  - f/u ECHO  Hypokalemia/hypomagmesemia-- now resolved after repletion.  Continue to monitor - follow BMET  DM-- on lantus 18 units qhs, farxiga, and novolog 3 units TID at home - lantus 10 units qhs - SSI  HTN-- continue home lopressor 25mg  BID - add lisinopril-HCTZ 20-25mg   Depression-- cont prozac 20mg  qhs  Length of Stay: 1 day(s) Dispo: Anticipated discharge today pending echocardiogram.  Carolynn CommentBryan Shekela Goodridge, MD Pager: [phone] (7AM-5PM) 02/02/2016, 6:40 AM

## 2016-02-02 NOTE — Discharge Summary (Signed)
Name: [name] MRN: [id number] DOB: 12/30/1955 60 y.o. PCP: Oley Balm. Margo Common, MD  Date of Admission: 01/31/2016 10:05 PM Date of Discharge: 02/03/2016 Attending Physician: Levert Feinstein, MD  Discharge Diagnosis: 1. HTN 2. Volume Overload 3. Viral gastroenteritis  Discharge Medications:   Medication List    STOP taking these medications   amoxicillin 500 MG tablet Commonly known as:  AMOXIL   ondansetron 4 MG tablet Commonly known as:  ZOFRAN     TAKE these medications   aspirin EC 325 MG tablet Take 325 mg by mouth daily.   clobetasol cream 0.05 % Commonly known as:  TEMOVATE Apply topically 2 (two) times daily.   collagenase ointment Commonly known as:  SANTYL Apply in a nickel thickness to left achilles wound in the wound bed only, being careful not to get on surrounding skin.  Cover with dry gauze.   FLUoxetine 20 MG capsule Commonly known as:  PROZAC Take 20 mg by mouth 2 (two) times daily.   hydrochlorothiazide 25 MG tablet Commonly known as:  HYDRODIURIL Take 1 tablet (25 mg total) by mouth daily.   HYDROmorphone 4 MG tablet Commonly known as:  DILAUDID Take 4 mg by mouth 2 (two) times daily as needed. For pain   insulin glargine 100 UNIT/ML injection Commonly known as:  LANTUS Inject 0.18 mLs (18 Units total) into the skin 2 (two) times daily. What changed:  when to take this   lisinopril 20 MG tablet Commonly known as:  PRINIVIL,ZESTRIL Take 1 tablet (20 mg total) by mouth daily.   metoprolol tartrate 25 MG tablet Commonly known as:  LOPRESSOR Take 1 tablet (25 mg total) by mouth 2 (two) times daily.   multivitamin with minerals Tabs tablet Take 1 tablet by mouth daily.   NOVOLOG FLEXPEN 100 UNIT/ML FlexPen Generic drug:  insulin aspart Inject 3 Units into the skin 3 (three) times daily with meals.   pantoprazole 40 MG tablet Commonly known as:  PROTONIX Take 1 tablet (40 mg total) by mouth daily.   pravastatin 40 MG  tablet Commonly known as:  PRAVACHOL Take 40 mg by mouth at bedtime.   PROAIR HFA 108 (90 Base) MCG/ACT inhaler Generic drug:  albuterol Inhale 2 puffs into the lungs every 6 (six) hours as needed for wheezing or shortness of breath.       Disposition and follow-up:   [name] was discharged from [facility] in Good condition.  At the hospital follow up visit please address:  HTN: BP control on Lisinopril 20mg  qD, HCTZ 25mg  qD, and metoprolol 25mg  BID? Consider adding agents if necessary. Volume Status: Clinical signs of hypervolemia? Symptoms of SOB, DOE, LE swelling? Follow-up with Cardiology should be arranged for reduced EF. GERD: Symptoms of abd pain? Compliance with trial of PPI?  2.  Labs / imaging needed at time of follow-up: BMP  Follow-up Appointments: Follow-up Information    TAPPER,[name], MD. Schedule an appointment as soon as possible for a visit in 2 week(s).   Specialty:  Family Medicine Contact information: [address] Raeanne Gathers [place] [address] [phone]          Hospital Course by problem list: Active Problems:   Hypertension   Abdominal pain   Volume overload   1. Viral gastroenteritis: Patient presented with symptoms of abdominal pain, nausea, vomiting which persisted for 2 days. She notices the symptoms started after eating a hamburger from fast food restaurant. She notes that she  has some diarrhea and some nonbloody nonbilious vomiting associated with this. She presented to the emergency department [facility] but was transferred to [facility] for evaluation with CT. Abdominal CT demonstrated no times of acute intra-abdominal infection or urgent. Symptoms resolved overnight and she endorsed feeling much improved. It was thought that she may have had a primary viral gastroenteritis which was self limited. She was treated with Protonix 40 mg daily, and GI cocktail which was symptomatically helpful. Some component  of her discomfort may be related to GERD the setting of significantly increased life stressors and notable epigastric pain which seemed to radiate through the chest. The symptoms were relieved with the treatments above. She was discharged with Protonix 40 mg.  2. Hypertension: Patient presented with significantly elevated systolic blood pressures in the 180s to 190s over 80s to 90s. Her blood pressure had previously been well controlled on metoprolol 25 mg twice a day after her significant infection and sepsis earlier this year in February. Prior to her infection, she was treated with metoprolol, lisinopril, HCTZ, and nifedipine in order to control her blood pressures. Her blood pressures seems to have been slowly increasing again over the last several months. We started her on lisinopril 20 mg and HCTZ 25 mg in addition to her metoprolol 25 mg twice a day with good effect drawing her blood pressure. She will need close follow-up in order to titrate his medications to her blood pressures as an outpatient.  3. Volume overload: Patient was noted have volume overload on her CT abdomen. She was noted to have bilateral pleural effusions at the lung bases and whole body wall edema. She denies any specific shortness of breath, dyspnea on exertion, lower show any swelling, PND, orthopnea. However her BNP was elevated at 1090. This is concerning for possible heart failure in the setting of significant hypertension. She was evaluated with an echocardiogram which demonstrated reduced EF to 45-50%. She was instructed to see cardiology for follow-up as an outpt.  Discharge Vitals:   BP (!) 161/70 (BP Location: Left Arm)   Pulse 88   Temp 98.4 F (36.9 C) (Oral)   Resp 20   Ht 5\' 3"  (1.6 m)   Wt 198 lb 12.8 oz (90.2 kg)   SpO2 92%   BMI 35.22 kg/m   Pertinent Labs, Studies, and Procedures: BNP: 1090  Procedures Performed:  Dg Chest 2 View  Result Date: 02/02/2016 CLINICAL DATA:  Dyspnea and weakness  today. EXAM: CHEST  2 VIEW COMPARISON:  Single-view of the chest 02/01/2016. PA and lateral chest 11/15/2015. FINDINGS: There are small pleural effusions and basilar airspace disease, worse on the left. Aeration in the left base appears mildly improved compared to the most recent examination. Changes on the right are stable in appearance. Cardiomegaly is noted. No pneumothorax. IMPRESSION: Small bilateral pleural effusions and basilar airspace disease which could be atelectasis or pneumonia. Aeration in the left lung base appears improved since the most recent examination. Electronically Signed   By: Drusilla Kanner M.D.   On: 02/02/2016 11:33   Ct Abdomen Pelvis W Contrast  Result Date: 02/01/2016 CLINICAL DATA:  Abdominal pain, onset yesterday. Vomiting and diarrhea. EXAM: CT ABDOMEN AND PELVIS WITH CONTRAST TECHNIQUE: Multidetector CT imaging of the abdomen and pelvis was performed using the standard protocol following bolus administration of intravenous contrast. CONTRAST:  ISOVUE-300 IOPAMIDOL (ISOVUE-300) INJECTION 61% COMPARISON:  CT 11/15/2015 FINDINGS: Lower chest: Bilateral pleural effusions, right greater than left. Adjacent compressive atelectasis. Cardiomegaly with trace  pericardial effusion. Liver: Prominent size with steatosis.  No focal lesion. Hepatobiliary: Gallbladder physiologically distended, no calcified stone. No biliary dilatation. Pancreas: No ductal dilatation or inflammation. Spleen: Normal. Adrenal glands: No nodule. Kidneys: Symmetric renal enhancement. No hydronephrosis. There is symmetric peri-nephric edema and that is unchanged from prior CT. Symmetric excretion on delayed phase imaging. Small cyst in the upper left kidney. Stomach/Bowel: Small hiatal hernia. Stomach physiologically distended. There are no dilated or thickened small bowel loops. Ventral abdominal wall hernia contains nonobstructed noninflamed transverse colon. A few distal colonic diverticular without  diverticulitis. Small volume of stool throughout the colon without colonic wall thickening. The appendix is normal. Vascular/Lymphatic: No retroperitoneal adenopathy. Abdominal aorta is normal in caliber. Atherosclerosis of the abdominal aorta and its branches without aneurysm. Reproductive: Calcified uterine fibroids.  Ovaries are quiescent. Bladder: Physiologically distended. Focus of intravesicular air. No definite bladder wall thickening. Other: No ascites or free air. No intra-abdominal abscess. Complex supraumbilical midline ventral abdominal wall hernias, containing fat. One of these hernias contains nonobstructed transverse colon. There is a small umbilical hernia. Whole body wall edema is new. Musculoskeletal: There are no acute or suspicious osseous abnormalities. Stable degenerative change in the spine. IMPRESSION: 1. Fluid overload. Bilateral pleural effusions and whole body wall edema. 2. Symmetric bilateral perinephric edema is nonspecific, however unchanged from prior CT. Question underlying renal disease or pyelonephritis. 3. Focus of intravesicular air is likely secondary to instrumentation. Urinary tract infection could produce this appearance. 4. Ventral abdominal wall hernias, of which contains nonobstructed normal transverse colon. Additional hernias contain fat. 5. Atherosclerosis without aneurysm. Electronically Signed   By: Rubye OaksMelanie  Ehinger M.D.   On: 02/01/2016 06:59   Dg Abd Acute W/chest  Result Date: 02/01/2016 CLINICAL DATA:  Diffuse abdominal pain.  Nausea, vomiting, diarrhea. EXAM: DG ABDOMEN ACUTE W/ 1V CHEST COMPARISON:  Chest radiographs and abdominal CT 11/15/2015 FINDINGS: Mild cardiomegaly. Minimal vascular congestion without overt edema. Mild bibasilar atelectasis. No evidence pleural effusion. No evidence of free air. No dilated bowel loops to suggest obstruction. Small volume of colonic stool. Relative paucity of bowel gas is similar to scout from prior CT and normal for  this patient. Calcified uterine fibroids. No acute osseous abnormality is seen. IMPRESSION: 1. No evidence of bowel obstruction or free air. 2. Cardiomegaly with vascular congestion, question fluid overload. Electronically Signed   By: Rubye OaksMelanie  Ehinger M.D.   On: 02/01/2016 01:23   2D Echo: Study Conclusions - Left ventricle: The cavity size was normal. There was moderate   concentric hypertrophy. Systolic function was mildly reduced. The   estimated ejection fraction was in the range of 45% to 50%. Wall   motion was normal; there were no regional wall motion   abnormalities. Doppler parameters are consistent with abnormal   left ventricular relaxation (grade 1 diastolic dysfunction). - Mitral valve: There was mild regurgitation. Valve area by   continuity equation (using LVOT flow): 3.61 cm^2. - Left atrium: The atrium was mildly dilated. - Tricuspid valve: There was mild-moderate regurgitation. - Pulmonary arteries: Systolic pressure was moderately increased.   PA peak pressure: 57 mm Hg (S). - Pericardium, extracardiac: A small pericardial effusion was   identified.  Discharge Instructions: Discharge Instructions    (HEART FAILURE PATIENTS) Call MD:  Anytime you have any of the following symptoms: 1) 3 pound weight gain in 24 hours or 5 pounds in 1 week 2) shortness of breath, with or without a dry hacking cough 3) swelling in the hands, feet or stomach  4) if you have to sleep on extra pillows at night in order to breathe.    Complete by:  As directed   Diet - low sodium heart healthy    Complete by:  As directed   Increase activity slowly    Complete by:  As directed     Signed: Carolynn Comment, MD 02/03/2016, 1:56 PM   Pager: [phone]

## 2016-02-03 ENCOUNTER — Inpatient Hospital Stay (HOSPITAL_COMMUNITY): Payer: BLUE CROSS/BLUE SHIELD

## 2016-02-03 DIAGNOSIS — A084 Viral intestinal infection, unspecified: Principal | ICD-10-CM

## 2016-02-03 LAB — URINE CULTURE

## 2016-02-03 LAB — BASIC METABOLIC PANEL
Anion gap: 11 (ref 5–15)
BUN: 8 mg/dL (ref 6–20)
CHLORIDE: 97 mmol/L — AB (ref 101–111)
CO2: 33 mmol/L — ABNORMAL HIGH (ref 22–32)
CREATININE: 1.17 mg/dL — AB (ref 0.44–1.00)
Calcium: 9.3 mg/dL (ref 8.9–10.3)
GFR calc non Af Amer: 50 mL/min — ABNORMAL LOW (ref >60)
GFR, EST AFRICAN AMERICAN: 57 mL/min — AB (ref >60)
Glucose, Bld: 190 mg/dL — ABNORMAL HIGH (ref 65–99)
POTASSIUM: 4 mmol/L (ref 3.5–5.1)
SODIUM: 141 mmol/L (ref 135–145)

## 2016-02-03 LAB — ECHOCARDIOGRAM COMPLETE
HEIGHTINCHES: 63 in
WEIGHTICAEL: 3180.8 [oz_av]

## 2016-02-03 LAB — GLUCOSE, CAPILLARY
Glucose-Capillary: 166 mg/dL — ABNORMAL HIGH (ref 65–99)
Glucose-Capillary: 124 mg/dL — ABNORMAL HIGH (ref 65–99)

## 2016-02-03 MED ORDER — LISINOPRIL 20 MG PO TABS: 20.0000 mg | ORAL_TABLET | Freq: Every day | ORAL | 0 refills | Status: DC

## 2016-02-03 MED ORDER — PANTOPRAZOLE SODIUM 40 MG PO TBEC: 40.0000 mg | DELAYED_RELEASE_TABLET | Freq: Every day | ORAL | 2 refills | Status: AC

## 2016-02-03 MED ORDER — HYDROCHLOROTHIAZIDE 25 MG PO TABS: 25.0000 mg | ORAL_TABLET | Freq: Every day | ORAL | 0 refills | Status: DC

## 2016-02-03 MED ORDER — COLLAGENASE 250 UNIT/GM EX OINT: TOPICAL_OINTMENT | CUTANEOUS | 0 refills | Status: AC

## 2016-02-03 NOTE — Progress Notes (Signed)
Subjective: Today, she is without oxygen requirement is tolerating oral intake. She looks forward to going home. I offered to refill her blood pressure medications, but she told me she has an extra supply at home. I also reviewed with her the findings of her echo which were notable for reduced ejection fraction EF 45-50% for which she will need follow-up with outpatient cardiologist. She agrees that she will follow closely with her PCP and thinks us for care.  Objective: Vital signs in last 24 hours: Vitals:   02/03/16 0500 02/03/16 0624 02/03/16 0635 02/03/16 1409  BP:  (!) 178/87 (!) 161/70 (!) 161/80  Pulse:  89 88 86  Resp:  20    Temp:  98.4 F (36.9 C)    TempSrc:  Oral    SpO2:  92%  92%  Weight: 198 lb 12.8 oz (90.2 kg)     Height:       24-hour weight change: Filed Weights   02/01/16 1002 02/02/16 0655 02/03/16 0500  Weight: 208 lb 6.4 oz (94.5 kg) 202 lb 4.8 oz (91.8 kg) 198 lb 12.8 oz (90.2 kg)   Intake/Output:  08/25 0701 - 08/26 0700 In: 150 [P.O.:150] Out: 1350 [Urine:1350]    Physical Exam: Physical Exam  Constitutional: She is oriented to person, place, and time. No distress.  HENT:  Head: Normocephalic and atraumatic.  Eyes: Conjunctivae are normal. No scleral icterus.  Cardiovascular: Normal rate and regular rhythm.   Pulmonary/Chest: Effort normal. No respiratory distress.  Neurological: She is alert and oriented to person, place, and time.  Skin: She is not diaphoretic.     Labs: CBC:  Recent Labs Lab 01/31/16 2236 02/01/16 0442  WBC 9.0  --   HGB 10.7* 12.2  HCT 34.3* 36.0  MCV 99.7  --   PLT 454*  --    Metabolic Panel:  Recent Labs Lab 01/31/16 2236 02/01/16 0442 02/01/16 0830 02/02/16 0515 02/03/16 0654  NA 140 144  --  141 141  K 2.6* 3.1*  --  3.1* 4.0  CL 101 100*  --  99* 97*  CO2 29  --   --  33* 33*  GLUCOSE 82 128*  --  113* 190*  BUN 8 5*  --  5* 8  CREATININE 1.11* 1.00  --  1.10* 1.17*  CALCIUM 9.0  --   --   9.1 9.3  MG  --   --  1.6* 2.4  --   ALT 10*  --   --   --   --   ALKPHOS 120  --   --   --   --   BILITOT 0.7  --   --   --   --   PROT 8.0  --   --   --   --   ALBUMIN 3.0*  --   --   --   --   LIPASE 19  --   --   --   --    Cardiac Labs:  Recent Labs Lab 02/01/16 0222 02/01/16 0844 02/02/16 0515  TROPONINI 0.03*  --  0.03*  BNP  --  1,090.5*  --    BG:  Recent Labs Lab 02/02/16 1154 02/02/16 1714 02/02/16 2108 02/03/16 0849 02/03/16 1211  GLUCAP 114* 115* 167* 166* 124*    Lab Results  Component Value Date   HGBA1C 9.7 (H) 09/09/2015   Microbiology: UCx notable for Klebsiella resistant to ampicillin and sensitive to cephalosporins, nitrofurantoin, Zosyn, Bactrim, fluoroquinolone  Imaging: Echo: EF 45-50%, grade 1 diastolic dysfunction, mild/moderate TR, elevated pulmonary artery pressure 57 mmHg.    Assessment/Plan: Pt is a 60 y.o. yo female with a PMHx of DM, HTN, depression who was admitted on 01/31/2016 with symptoms of N/V though to be 2/2 viral infection, was found to have significant pulmonary and body wall edema. Interventions at this time will be focused on evaluation for heart failure and diuresis.   Abdominal Pain: Resolved. Likely viral in etiology.  Heart failure with reduced ejection fraction: Echo findings as noted above. Currently without symptoms so NYHA Class I. Discharge weight 198 lbs, down from 230 lbs.  - daily weights, strict I/Os - Continue IV lasix 40mg  daily   DM-- Resume lantus 10 units qhs, SSI.  HTN-- Continue home lopressor 25mg  BID, lisinopril-HCTZ 20-25mg   Depression-- Continue prozac 20mg  qhs  Length of Stay: 2 day(s) Dispo: Anticipated discharge today.  Beather Arbour, MD Pager: [phone] (7AM-5PM) 02/03/2016, 9:11 PM

## 2016-02-03 NOTE — Evaluation (Signed)
Physical Therapy Evaluation Patient Details Name: Sophia EllisonGenevieve A Gault MRN: [id number] DOB: 09-Dec-1955 Today's Date: 02/03/2016   History of Present Illness  Pt is a 60 y/o F who presented with epigastric pain accompanied by vomiting thought to be due to food poisoning.  Lab and x-ray findings of early congestive heart failure with no clinical signs or symptoms except for mild rales at the lung bases. Pt's PMH includes depression, anxiety.     Clinical Impression  Pt admitted with above diagnosis. Pt currently with functional limitations due to the deficits listed below (see PT Problem List). Ms. Seward MethBroadnax presents with impairments with high level balance activities but otherwise with no other complaints today.  She will have 24/7 assist/supervision from family at d/c. Pt will benefit from skilled PT to increase their independence and safety with mobility to allow discharge to the venue listed below.      Follow Up Recommendations Home health PT;Supervision for mobility/OOB    Equipment Recommendations  None recommended by PT    Recommendations for Other Services       Precautions / Restrictions Precautions Precautions: Fall Restrictions Weight Bearing Restrictions: No      Mobility  Bed Mobility Overal bed mobility: Independent             General bed mobility comments: No cues or physical assist needed. HOB flat and pt does not use bed rail.  Transfers Overall transfer level: Needs assistance Equipment used: None Transfers: Sit to/from Stand Sit to Stand: Supervision         General transfer comment: Supervision for safety  Ambulation/Gait Ambulation/Gait assistance: Supervision Ambulation Distance (Feet): 300 Feet Assistive device: None Gait Pattern/deviations: Step-through pattern;Decreased stride length Gait velocity: decreased   General Gait Details: Decreased gait speed and reaching out for railing as she begins to fatigue at end of ambulation.     Stairs            Wheelchair Mobility    Modified Rankin (Stroke Patients Only)       Balance Overall balance assessment: Needs assistance (Denies any falls in the past year.) Sitting-balance support: No upper extremity supported;Feet supported Sitting balance-Leahy Scale: Normal     Standing balance support: No upper extremity supported;During functional activity Standing balance-Leahy Scale: Fair                   Standardized Balance Assessment Standardized Balance Assessment : Dynamic Gait Index   Dynamic Gait Index Level Surface: Mild Impairment Change in Gait Speed: Mild Impairment Gait with Horizontal Head Turns: Mild Impairment Gait with Vertical Head Turns: Mild Impairment Gait and Pivot Turn: Normal Step Over Obstacle: Moderate Impairment Step Around Obstacles: Mild Impairment       Pertinent Vitals/Pain Pain Assessment: No/denies pain    Home Living Family/patient expects to be discharged to:: Private residence Living Arrangements: Other relatives Available Help at Discharge: Family;Available 24 hours/day Type of Home: House Home Access: Stairs to enter Entrance Stairs-Rails: Can reach both;Right;Left Entrance Stairs-Number of Steps: 4 Home Layout: One level;Laundry or work area in basement (family does Pharmacologistlaundry for pt) Home Equipment: Environmental consultantWalker - 2 wheels;Cane - single point;Wheelchair - [employer]manual;Bedside commode;Shower seat Additional Comments: Aunt/brother provide 24/7 asisst/supervision    Prior Function Level of Independence: Needs assistance   Gait / Transfers Assistance Needed: Over the past few weeks has not been using AD, previously used cane/RW prn.    ADL's / Homemaking Assistance Needed: Family assists with bathing, dressing.  Aunt does the cooking.  Pt  does not drive.        Hand Dominance        Extremity/Trunk Assessment               Lower Extremity Assessment: Overall WFL for tasks assessed          Communication   Communication: No difficulties  Cognition Arousal/Alertness: Awake/alert Behavior During Therapy: WFL for tasks assessed/performed Overall Cognitive Status: Within Functional Limits for tasks assessed                      General Comments      Exercises        Assessment/Plan    PT Assessment Patient needs continued PT services  PT Diagnosis Difficulty walking   PT Problem List Decreased activity tolerance;Decreased balance  PT Treatment Interventions DME instruction;Gait training;Stair training;Functional mobility training;Therapeutic activities;Therapeutic exercise;Balance training;Patient/family education   PT Goals (Current goals can be found in the Care Plan section) Acute Rehab PT Goals Patient Stated Goal: to go home today PT Goal Formulation: With patient Time For Goal Achievement: 02/10/16 Potential to Achieve Goals: Good    Frequency Min 3X/week   Barriers to discharge        Co-evaluation               End of Session Equipment Utilized During Treatment: Gait belt Activity Tolerance: Patient tolerated treatment well Patient left: in chair;with call bell/phone within reach;with chair alarm set Nurse Communication: Mobility status         Time: 1128-1140 PT Time Calculation (min) (ACUTE ONLY): 12 min   Charges:   PT Evaluation $PT Eval Low Complexity: 1 Procedure     PT G Codes:       Encarnacion Chu PT, DPT  Pager: [phone] Phone: [phone] 02/03/2016, 11:56 AM

## 2016-02-03 NOTE — Progress Notes (Signed)
PT Cancellation Note  Patient Details Name: [name] MRN: [id number] DOB: 10/07/1955   Cancelled Treatment:    Reason Eval/Treat Not Completed: Patient at procedure or test/unavailable (for Echo).  Will continue to follow acutely.  Encarnacion ChuAshley Abashian PT, DPT  Pager: [phone] Phone: [phone] 02/03/2016, 10:36 AM

## 2016-02-03 NOTE — Progress Notes (Signed)
  Echocardiogram 2D Echocardiogram has been performed.  Tye SavoyCasey N Reshunda Strider 02/03/2016, 11:13 AM

## 2016-02-03 NOTE — Care Management Note (Signed)
Case Management Note  Patient Details  Name: [name] MRN: [id number] Date of Birth: 08-27-1955  Subjective/Objective:                  epigastric pain accompanied by vomiting Action/Plan: Discharge planning Expected Discharge Date:                  Expected Discharge Plan:  [place]  In-House Referral:     Discharge planning Services  CM Consult  Post Acute Care Choice:  Home Health Choice offered to:  Patient  DME Arranged:    DME Agency:  NA  HH Arranged:  RN, PT, OT, Nurse's Aide, Social Work [employer] Agency:  [facility]  Status of Service:  Completed, signed off  If discussed at [employer], dates discussed:    Additional Comments: CM met with pt to offer choice of home health agency.  Pt declines all [place] services and states she has all the DME she needs.  No other CM needs were communicated. Dellie Catholic, RN 02/03/2016, 3:24 PM

## 2016-02-06 LAB — CULTURE, BLOOD (ROUTINE X 2)
CULTURE: NO GROWTH
Culture: NO GROWTH

## 2016-02-17 ENCOUNTER — Inpatient Hospital Stay (HOSPITAL_COMMUNITY)
Admission: EM | Admit: 2016-02-17 | Discharge: 2016-02-18 | DRG: 690 | Disposition: A | Discharge status: Home / Self Care | Payer: BLUE CROSS/BLUE SHIELD | Attending: Internal Medicine | Admitting: Internal Medicine

## 2016-02-17 ENCOUNTER — Encounter (HOSPITAL_COMMUNITY): Payer: Self-pay

## 2016-02-17 ENCOUNTER — Emergency Department (HOSPITAL_COMMUNITY): Payer: BLUE CROSS/BLUE SHIELD

## 2016-02-17 DIAGNOSIS — E1122 Type 2 diabetes mellitus with diabetic chronic kidney disease: Secondary | ICD-10-CM | POA: Diagnosis present

## 2016-02-17 DIAGNOSIS — R739 Hyperglycemia, unspecified: Secondary | ICD-10-CM

## 2016-02-17 DIAGNOSIS — L26 Exfoliative dermatitis: Secondary | ICD-10-CM | POA: Diagnosis present

## 2016-02-17 DIAGNOSIS — I129 Hypertensive chronic kidney disease with stage 1 through stage 4 chronic kidney disease, or unspecified chronic kidney disease: Secondary | ICD-10-CM | POA: Diagnosis present

## 2016-02-17 DIAGNOSIS — D638 Anemia in other chronic diseases classified elsewhere: Secondary | ICD-10-CM | POA: Diagnosis present

## 2016-02-17 DIAGNOSIS — R1084 Generalized abdominal pain: Secondary | ICD-10-CM

## 2016-02-17 DIAGNOSIS — Z794 Long term (current) use of insulin: Secondary | ICD-10-CM

## 2016-02-17 DIAGNOSIS — N390 Urinary tract infection, site not specified: Principal | ICD-10-CM | POA: Diagnosis present

## 2016-02-17 DIAGNOSIS — R109 Unspecified abdominal pain: Secondary | ICD-10-CM | POA: Diagnosis present

## 2016-02-17 DIAGNOSIS — E785 Hyperlipidemia, unspecified: Secondary | ICD-10-CM | POA: Diagnosis present

## 2016-02-17 DIAGNOSIS — Z7982 Long term (current) use of aspirin: Secondary | ICD-10-CM | POA: Diagnosis not present

## 2016-02-17 DIAGNOSIS — N183 Chronic kidney disease, stage 3 unspecified: Secondary | ICD-10-CM | POA: Diagnosis present

## 2016-02-17 DIAGNOSIS — E119 Type 2 diabetes mellitus without complications: Secondary | ICD-10-CM

## 2016-02-17 DIAGNOSIS — R112 Nausea with vomiting, unspecified: Secondary | ICD-10-CM

## 2016-02-17 DIAGNOSIS — K219 Gastro-esophageal reflux disease without esophagitis: Secondary | ICD-10-CM | POA: Diagnosis present

## 2016-02-17 DIAGNOSIS — I10 Essential (primary) hypertension: Secondary | ICD-10-CM | POA: Diagnosis present

## 2016-02-17 DIAGNOSIS — Z992 Dependence on renal dialysis: Secondary | ICD-10-CM

## 2016-02-17 DIAGNOSIS — E876 Hypokalemia: Secondary | ICD-10-CM | POA: Diagnosis present

## 2016-02-17 DIAGNOSIS — F17210 Nicotine dependence, cigarettes, uncomplicated: Secondary | ICD-10-CM | POA: Diagnosis present

## 2016-02-17 DIAGNOSIS — I471 Supraventricular tachycardia: Secondary | ICD-10-CM | POA: Diagnosis present

## 2016-02-17 DIAGNOSIS — IMO0001 Reserved for inherently not codable concepts without codable children: Secondary | ICD-10-CM

## 2016-02-17 DIAGNOSIS — E7800 Pure hypercholesterolemia, unspecified: Secondary | ICD-10-CM | POA: Diagnosis present

## 2016-02-17 DIAGNOSIS — R1110 Vomiting, unspecified: Secondary | ICD-10-CM | POA: Diagnosis present

## 2016-02-17 LAB — URINALYSIS, ROUTINE W REFLEX MICROSCOPIC
BILIRUBIN URINE: NEGATIVE
Glucose, UA: >1000 mg/dL — AB
LEUKOCYTES UA: NEGATIVE
NITRITE: NEGATIVE
Protein, ur: 100 mg/dL — AB
SPECIFIC GRAVITY, URINE: 1.015 (ref 1.005–1.030)
pH: 6 (ref 5.0–8.0)

## 2016-02-17 LAB — COMPREHENSIVE METABOLIC PANEL
ALT: 12 U/L — ABNORMAL LOW (ref 14–54)
ANION GAP: 10 (ref 5–15)
AST: 12 U/L — ABNORMAL LOW (ref 15–41)
Albumin: 2.8 g/dL — ABNORMAL LOW (ref 3.5–5.0)
Alkaline Phosphatase: 138 U/L — ABNORMAL HIGH (ref 38–126)
BUN: 18 mg/dL (ref 6–20)
CHLORIDE: 98 mmol/L — AB (ref 101–111)
CO2: 29 mmol/L (ref 22–32)
CREATININE: 1.1 mg/dL — AB (ref 0.44–1.00)
Calcium: 9 mg/dL (ref 8.9–10.3)
GFR, EST NON AFRICAN AMERICAN: 53 mL/min — AB (ref >60)
Glucose, Bld: 413 mg/dL — ABNORMAL HIGH (ref 65–99)
POTASSIUM: 2.7 mmol/L — AB (ref 3.5–5.1)
SODIUM: 137 mmol/L (ref 135–145)
Total Bilirubin: 0.5 mg/dL (ref 0.3–1.2)
Total Protein: 7.8 g/dL (ref 6.5–8.1)

## 2016-02-17 LAB — CBC WITH DIFFERENTIAL/PLATELET
BASOS ABS: 0 10*3/uL (ref 0.0–0.1)
BASOS PCT: 0 %
EOS ABS: 0 10*3/uL (ref 0.0–0.7)
Eosinophils Relative: 0 %
HCT: 33.7 % — ABNORMAL LOW (ref 36.0–46.0)
Hemoglobin: 11 g/dL — ABNORMAL LOW (ref 12.0–15.0)
LYMPHS PCT: 6 %
Lymphs Abs: 1 10*3/uL (ref 0.7–4.0)
MCH: 30.5 pg (ref 26.0–34.0)
MCHC: 32.6 g/dL (ref 30.0–36.0)
MCV: 93.4 fL (ref 78.0–100.0)
MONO ABS: 1 10*3/uL (ref 0.1–1.0)
Monocytes Relative: 6 %
NEUTROS ABS: 13.4 10*3/uL — AB (ref 1.7–7.7)
NEUTROS PCT: 88 %
PLATELETS: 313 10*3/uL (ref 150–400)
RBC: 3.61 MIL/uL — ABNORMAL LOW (ref 3.87–5.11)
RDW: 14.2 % (ref 11.5–15.5)
WBC: 15.3 10*3/uL — ABNORMAL HIGH (ref 4.0–10.5)

## 2016-02-17 LAB — CBG MONITORING, ED
GLUCOSE-CAPILLARY: 382 mg/dL — AB (ref 65–99)
GLUCOSE-CAPILLARY: 386 mg/dL — AB (ref 65–99)
GLUCOSE-CAPILLARY: 295 mg/dL — AB (ref 65–99)

## 2016-02-17 LAB — URINE MICROSCOPIC-ADD ON

## 2016-02-17 LAB — LIPASE, BLOOD: LIPASE: 73 U/L — AB (ref 11–51)

## 2016-02-17 MED ORDER — IOPAMIDOL (ISOVUE-300) INJECTION 61%
INTRAVENOUS | Status: AC
  Administered 2016-02-17: 19:00:00
  Filled 2016-02-17: qty 30

## 2016-02-17 MED ORDER — SODIUM CHLORIDE 0.9 % IV SOLN
INTRAVENOUS | Status: DC
  Administered 2016-02-17: 17:00:00 via INTRAVENOUS

## 2016-02-17 MED ORDER — POTASSIUM CHLORIDE CRYS ER 20 MEQ PO TBCR
40.0000 meq | EXTENDED_RELEASE_TABLET | Freq: Once | ORAL | Status: AC
  Administered 2016-02-17: 40 meq via ORAL
  Filled 2016-02-17: qty 2

## 2016-02-17 MED ORDER — CIPROFLOXACIN IN D5W 400 MG/200ML IV SOLN
400.0000 mg | Freq: Once | INTRAVENOUS | Status: AC
  Administered 2016-02-17: 400 mg via INTRAVENOUS
  Filled 2016-02-17: qty 200

## 2016-02-17 MED ORDER — LORAZEPAM 2 MG/ML IJ SOLN
1.0000 mg | Freq: Once | INTRAMUSCULAR | Status: AC
Start: 2016-02-17 — End: 2016-02-17
  Administered 2016-02-17: 1 mg via INTRAVENOUS
  Filled 2016-02-17: qty 1

## 2016-02-17 MED ORDER — METOPROLOL TARTRATE 5 MG/5ML IV SOLN
5.0000 mg | INTRAVENOUS | Status: DC | PRN
  Administered 2016-02-17: 5 mg via INTRAVENOUS
  Filled 2016-02-17: qty 5

## 2016-02-17 MED ORDER — POTASSIUM CHLORIDE 10 MEQ/100ML IV SOLN
INTRAVENOUS | Status: AC
  Filled 2016-02-17: qty 100

## 2016-02-17 MED ORDER — SODIUM CHLORIDE 0.9 % IV BOLUS (SEPSIS)
500.0000 mL | Freq: Once | INTRAVENOUS | Status: AC
  Administered 2016-02-17: 500 mL via INTRAVENOUS

## 2016-02-17 MED ORDER — INSULIN ASPART 100 UNIT/ML ~~LOC~~ SOLN
10.0000 [IU] | Freq: Once | SUBCUTANEOUS | Status: AC
  Administered 2016-02-17: 10 [IU] via INTRAVENOUS
  Filled 2016-02-17: qty 1

## 2016-02-17 MED ORDER — POTASSIUM CHLORIDE 10 MEQ/100ML IV SOLN
10.0000 meq | Freq: Once | INTRAVENOUS | Status: AC
  Administered 2016-02-17: 10 meq via INTRAVENOUS

## 2016-02-17 MED ORDER — POTASSIUM CHLORIDE 10 MEQ/100ML IV SOLN
10.0000 meq | Freq: Once | INTRAVENOUS | Status: AC
  Administered 2016-02-17: 10 meq via INTRAVENOUS
  Filled 2016-02-17: qty 100

## 2016-02-17 MED ORDER — ONDANSETRON HCL 4 MG/2ML IJ SOLN
4.0000 mg | Freq: Once | INTRAMUSCULAR | Status: AC
  Administered 2016-02-17: 4 mg via INTRAVENOUS
  Filled 2016-02-17: qty 2

## 2016-02-17 NOTE — ED Notes (Signed)
Call pt's sisters with decision for d/c or admission Liborio NixonJanice [phone] or [phone]

## 2016-02-17 NOTE — ED Notes (Signed)
Upon entering room pt's O2 sat was 39% on RA, finger probe changed. Pt denied SOB/CP. Pt was 7os on RA after probe changed, placed on 3 L [place], now at 96%. MD Zackowski notified of event.

## 2016-02-17 NOTE — ED Triage Notes (Signed)
Pt states she went to her PCP Tuesday for a rash. States she was given a steroid and was told to monitor her blood sugar. States she has not taken her insulin in a couple of days

## 2016-02-17 NOTE — ED Notes (Signed)
CRITICAL VALUE ALERT  Critical value received:  K+ 2.7  Date of notification:  02/17/16  Time of notification:  1747  Critical value read back:yes  Nurse who received alert:  Rory PercyS Makinzy Cleere RN  MD notified (1st page):  Deretha EmoryZackowski  Time of first page:  1748  MD notified (2nd page):  Time of second page:  Responding MD:  Deretha EmoryZackowski  Time MD responded:  [phone]

## 2016-02-17 NOTE — ED Provider Notes (Addendum)
[facility] Provider Note   CSN: [id number] Arrival date & time: 02/17/16  1500     History   Chief Complaint Chief Complaint  Patient presents with  . Emesis    HPI [name] is a 60 y.o. female.  Patient seen by her primary care doctor on Tuesday for rash and was started on prednisone. Patient started having nausea and vomiting 2 days ago. Has not been taking her medications blood sugar has been high. Patient also with complaint of some generalized abdominal pain has vomited several times a day.      Past Medical History:  Diagnosis Date  . Anxiety   . Depression   . GERD (gastroesophageal reflux disease)   . High cholesterol   . Hypertension   . Type II diabetes mellitus [facility])     Patient Active Problem List   Diagnosis Date Noted  . Volume overload 02/02/2016  . Abdominal pain 02/01/2016  . CKD (chronic kidney disease), stage III 10/24/2015  . Abscess of axilla, right 10/23/2015  . HLD (hyperlipidemia) 10/12/2015  . Anemia of chronic disease 10/12/2015  . Depression 10/12/2015  . UTI (urinary tract infection) 09/12/2015  . Generalized exfoliative dermatitis 09/09/2015  . Normocytic anemia 09/09/2015  . Necrotizing fasciitis (HCC) of inner thigh 08/07/2015  . IDDM (insulin dependent diabetes mellitus) (HCC) 08/07/2015  . History of hypertension 08/07/2015  . Hypertension 08/07/2015    Past Surgical History:  Procedure Laterality Date  . APPLICATION OF WOUND VAC Right 10/25/2015   Procedure: APPLICATION OF WOUND VAC;  Surgeon: Franky Macho, MD;  Location: [facility]S;  Service: General;  Laterality: Right;  . CESAREAN SECTION  1984  . INCISION AND DRAINAGE Right 08/09/2015   Procedure: INCISION AND DRAINAGE;  Surgeon: De Blanch Kinsinger, MD;  Location: [facility] OR;  Service: General;  Laterality: Right;  . INCISION AND DRAINAGE ABSCESS Right 08/08/2015   Procedure: Irrigation and Debridement of Right Lower Ext.;  Surgeon: Axel Filler, MD;   Location: [facility] OR;  Service: General;  Laterality: Right;  . INCISION AND DRAINAGE ABSCESS Right 10/25/2015   Procedure: INCISION AND DRAINAGE ABSCESS RIGHT AXILLARY ABSCESS;  Surgeon: Franky Macho, MD;  Location: [facility];  Service: General;  Laterality: Right;    OB History    No data available       Home Medications    Prior to Admission medications   Medication Sig Start Date End Date Taking? Authorizing Provider  albuterol (PROAIR HFA) 108 (90 Base) MCG/ACT inhaler Inhale 2 puffs into the lungs every 6 (six) hours as needed for wheezing or shortness of breath.    Historical Provider, MD  aspirin EC 325 MG tablet Take 325 mg by mouth daily.    Historical Provider, MD  clobetasol cream (TEMOVATE) 0.05 % Apply topically 2 (two) times daily. 09/21/15   Leroy Sea, MD  collagenase (SANTYL) ointment Apply in a nickel thickness to left achilles wound in the wound bed only, being careful not to get on surrounding skin.  Cover with dry gauze. 02/03/16   Beather Arbour, MD  FLUoxetine (PROZAC) 20 MG capsule Take 20 mg by mouth 2 (two) times daily.  05/11/15   Historical Provider, MD  hydrochlorothiazide (HYDRODIURIL) 25 MG tablet Take 1 tablet (25 mg total) by mouth daily. 02/03/16   Beather Arbour, MD  HYDROmorphone (DILAUDID) 4 MG tablet Take 4 mg by mouth 2 (two) times daily as needed. For pain 11/09/15   Historical Provider, MD  insulin aspart (NOVOLOG  FLEXPEN) 100 UNIT/ML FlexPen Inject 3 Units into the skin 3 (three) times daily with meals.    Historical Provider, MD  insulin glargine (LANTUS) 100 UNIT/ML injection Inject 0.18 mLs (18 Units total) into the skin 2 (two) times daily. Patient taking differently: Inject 18 Units into the skin at bedtime.  08/15/15   Leroy Sea, MD  lisinopril (PRINIVIL,ZESTRIL) 20 MG tablet Take 1 tablet (20 mg total) by mouth daily. 02/03/16   Beather Arbour, MD  metoprolol tartrate (LOPRESSOR) 25 MG tablet Take 1 tablet (25 mg total) by mouth 2 (two) times  daily. 09/21/15   Leroy Sea, MD  Multiple Vitamin (MULTIVITAMIN WITH MINERALS) TABS tablet Take 1 tablet by mouth daily. 10/26/15   Henderson Cloud, MD  pantoprazole (PROTONIX) 40 MG tablet Take 1 tablet (40 mg total) by mouth daily. 02/03/16   Beather Arbour, MD  pravastatin (PRAVACHOL) 40 MG tablet Take 40 mg by mouth at bedtime.  05/11/15   Historical Provider, MD    Family History No family history on file.  Social History Social History  Substance Use Topics  . Smoking status: Current Every Day Smoker    Packs/day: 0.10    Years: 35.00    Types: Cigarettes  . Smokeless tobacco: Never Used  . Alcohol use 0.6 oz/week    1 Glasses of wine per week     Allergies   Other and Penicillins   Review of Systems Review of Systems  Constitutional: Negative for fever.  HENT: Negative for congestion.   Eyes: Negative for visual disturbance.  Respiratory: Positive for shortness of breath.   Cardiovascular: Negative for chest pain.  Gastrointestinal: Positive for abdominal pain, nausea and vomiting.  Genitourinary: Negative for dysuria.  Musculoskeletal: Negative for back pain.  Skin: Positive for rash.  Neurological: Negative for headaches.  Hematological: Does not bruise/bleed easily.  Psychiatric/Behavioral: Negative for confusion.     Physical Exam Updated Vital Signs BP 182/91   Pulse 95   Temp 98 F (36.7 C) (Oral)   Resp 25   SpO2 96%   Physical Exam  Constitutional: She is oriented to person, place, and time. She appears well-developed and well-nourished. No distress.  HENT:  Head: Normocephalic and atraumatic.  Mucous membranes dry  Eyes: EOM are normal. Pupils are equal, round, and reactive to light.  Neck: Normal range of motion. Neck supple.  Cardiovascular: Regular rhythm and normal heart sounds.   Slightly tachycardic  Pulmonary/Chest: Effort normal and breath sounds normal. No respiratory distress. She exhibits no tenderness.  Abdominal:  Soft. Bowel sounds are normal. There is tenderness.  Slight diffuse tenderness to the abdomen.  Musculoskeletal: Normal range of motion. She exhibits no edema.  Neurological: She is alert and oriented to person, place, and time. No cranial nerve deficit. She exhibits normal muscle tone. Coordination normal.  Skin: Skin is warm. Rash noted.  Scattered papular type rash with some flaking of the skin. No upper line no significant peeling of skin.  Nursing note and vitals reviewed.    ED Treatments / Results  Labs (all labs ordered are listed, but only abnormal results are displayed) Labs Reviewed  CBC WITH DIFFERENTIAL/PLATELET - Abnormal; Notable for the following:       Result Value   WBC 15.3 (*)    RBC 3.61 (*)    Hemoglobin 11.0 (*)    HCT 33.7 (*)    Neutro Abs 13.4 (*)    All other components within normal limits  LIPASE, BLOOD - Abnormal; Notable for the following:    Lipase 73 (*)    All other components within normal limits  COMPREHENSIVE METABOLIC PANEL - Abnormal; Notable for the following:    Potassium 2.7 (*)    Chloride 98 (*)    Glucose, Bld 413 (*)    Creatinine, Ser 1.10 (*)    Albumin 2.8 (*)    AST 12 (*)    ALT 12 (*)    Alkaline Phosphatase 138 (*)    GFR calc non Af Amer 53 (*)    All other components within normal limits  URINALYSIS, ROUTINE W REFLEX MICROSCOPIC (NOT AT [facility]) - Abnormal; Notable for the following:    Glucose, UA >1000 (*)    Hgb urine dipstick SMALL (*)    Ketones, ur TRACE (*)    Protein, ur 100 (*)    All other components within normal limits  URINE MICROSCOPIC-ADD ON - Abnormal; Notable for the following:    Squamous Epithelial / LPF 0-5 (*)    Bacteria, UA MANY (*)    All other components within normal limits  CBG MONITORING, ED - Abnormal; Notable for the following:    Glucose-Capillary 382 (*)    All other components within normal limits  CBG MONITORING, ED - Abnormal; Notable for the following:    Glucose-Capillary 386 (*)     All other components within normal limits  CBG MONITORING, ED - Abnormal; Notable for the following:    Glucose-Capillary 295 (*)    All other components within normal limits  URINE CULTURE   Results for orders placed or performed during the hospital encounter of 02/17/16  CBC with Differential  Result Value Ref Range   WBC 15.3 (H) 4.0 - 10.5 K/uL   RBC 3.61 (L) 3.87 - 5.11 MIL/uL   Hemoglobin 11.0 (L) 12.0 - 15.0 g/dL   HCT 53.6 (L) 64.4 - 03.4 %   MCV 93.4 78.0 - 100.0 fL   MCH 30.5 26.0 - 34.0 pg   MCHC 32.6 30.0 - 36.0 g/dL   RDW 74.2 59.5 - 63.8 %   Platelets 313 150 - 400 K/uL   Neutrophils Relative % 88 %   Neutro Abs 13.4 (H) 1.7 - 7.7 K/uL   Lymphocytes Relative 6 %   Lymphs Abs 1.0 0.7 - 4.0 K/uL   Monocytes Relative 6 %   Monocytes Absolute 1.0 0.1 - 1.0 K/uL   Eosinophils Relative 0 %   Eosinophils Absolute 0.0 0.0 - 0.7 K/uL   Basophils Relative 0 %   Basophils Absolute 0.0 0.0 - 0.1 K/uL  Lipase, blood  Result Value Ref Range   Lipase 73 (H) 11 - 51 U/L  Comprehensive metabolic panel  Result Value Ref Range   Sodium 137 135 - 145 mmol/L   Potassium 2.7 (LL) 3.5 - 5.1 mmol/L   Chloride 98 (L) 101 - 111 mmol/L   CO2 29 22 - 32 mmol/L   Glucose, Bld 413 (H) 65 - 99 mg/dL   BUN 18 6 - 20 mg/dL   Creatinine, Ser 7.56 (H) 0.44 - 1.00 mg/dL   Calcium 9.0 8.9 - 43.3 mg/dL   Total Protein 7.8 6.5 - 8.1 g/dL   Albumin 2.8 (L) 3.5 - 5.0 g/dL   AST 12 (L) 15 - 41 U/L   ALT 12 (L) 14 - 54 U/L   Alkaline Phosphatase 138 (H) 38 - 126 U/L   Total Bilirubin 0.5 0.3 - 1.2 mg/dL   GFR  calc non Af Amer 53 (L) >60 mL/min   GFR calc Af Amer >60 >60 mL/min   Anion gap 10 5 - 15  Urinalysis, Routine w reflex microscopic (not at [facility])  Result Value Ref Range   Color, Urine YELLOW YELLOW   APPearance CLEAR CLEAR   Specific Gravity, Urine 1.015 1.005 - 1.030   pH 6.0 5.0 - 8.0   Glucose, UA >1000 (A) NEGATIVE mg/dL   Hgb urine dipstick SMALL (A) NEGATIVE   Bilirubin  Urine NEGATIVE NEGATIVE   Ketones, ur TRACE (A) NEGATIVE mg/dL   Protein, ur 161100 (A) NEGATIVE mg/dL   Nitrite NEGATIVE NEGATIVE   Leukocytes, UA NEGATIVE NEGATIVE  Urine microscopic-add on  Result Value Ref Range   Squamous Epithelial / LPF 0-5 (A) NONE SEEN   WBC, UA 6-30 0 - 5 WBC/hpf   RBC / HPF 0-5 0 - 5 RBC/hpf   Bacteria, UA MANY (A) NONE SEEN  POC CBG, ED  Result Value Ref Range   Glucose-Capillary 382 (H) 65 - 99 mg/dL  CBG monitoring, ED  Result Value Ref Range   Glucose-Capillary 386 (H) 65 - 99 mg/dL  CBG monitoring, ED  Result Value Ref Range   Glucose-Capillary 295 (H) 65 - 99 mg/dL    EKG  EKG Interpretation  Date/Time:  Saturday February 17 2016 16:28:30 EDT Ventricular Rate:  101 PR Interval:    QRS Duration: 146 QT Interval:  431 QTC Calculation: 559 R Axis:   -64 Text Interpretation:  Sinus tachycardia Atrial premature complex RBBB and LAFB No significant change since last tracing Confirmed by Aneliese Beaudry  MD, Zita Ozimek [phone]([id number]) on 02/17/2016 5:10:55 PM       Radiology Ct Abdomen Pelvis W Contrast  Result Date: 02/17/2016 CLINICAL DATA:  60 year old female with vomiting and abdominal pain. EXAM: CT ABDOMEN AND PELVIS WITH CONTRAST TECHNIQUE: Multidetector CT imaging of the abdomen and pelvis was performed using the standard protocol following bolus administration of intravenous contrast. CONTRAST:  1 ISOVUE-300 IOPAMIDOL (ISOVUE-300) INJECTION 61% COMPARISON:  CT dated 02/01/2016 FINDINGS: Partially visualized small bilateral pleural effusions relatively similar to prior study. There are bibasilar interstitial prominence and interlobular septal thickening most compatible with congestive changes and edema. There is mild cardiomegaly. No intra-abdominal free air. No free fluid. There is mild diffuse mesenteric stranding. There is slight heterogeneity and coarsened appearance of the liver which may be related to underlying fatty infiltration or hepatitis versus early  cirrhosis. Correlation with clinical exam and LFTs recommended. No intrahepatic biliary ductal dilatation noted. The gallbladder is only partially distended. Tiny focus of air within the gallbladder may be related to noncalcified stone. There is thickened appearance of the gallbladder wall which may partly be related to under distention or liver disease. Acute cholecystitis is not excluded further evaluation with ultrasound recommended. The common bile duct appears patent and unremarkable. The pancreas, spleen, adrenal glands, kidneys, visualized ureters, and urinary bladder appear unremarkable. Mild bilateral perinephric stranding again noted, nonspecific. Correlation with urinalysis and renal function tests recommended. Small amount of air within the urinary bladder likely iatrogenic. Correlation with history of recent instrumentation recommended. Small uterine calcified fibroids. The ovaries are grossly unremarkable. Evaluation of the bowel is limited in the absence of oral contrast. There is no evidence of bowel obstruction or active inflammation. Normal appendix. There are multiple small supraumbilical hernias. There is protrusion of a short segment of transverse colon in the uppermost hernia, similar to prior study without associated obstruction or inflammation. 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 The origins of the celiac axis, SMA, IMA as well as the origins of the renal arteries appear patent. No portal venous gas identified. The SMV, splenic vein, and main portal vein are patent. There is no adenopathy. Midline vertical anterior pelvic wall incisional scar noted. There is diffuse subcutaneous soft tissue edema and anasarca. No fluid collection. There is degenerative changes of the spine. No acute fracture. IMPRESSION: Partially visualized bilateral pleural effusions, pulmonary edema and anasarca. Mild heterogeneity of the liver. Correlation with clinical exam and liver function tests  recommended. Mild thickened appearance of the gallbladder with possible small noncalcified stone. Further evaluation with right upper quadrant ultrasound recommended. No evidence of bowel obstruction or active inflammation. Normal appendix. Ventral/supraumbilical hernia containing a short segment of transverse colon without evidence of obstruction or inflammation. Electronically Signed   By: Elgie Collard M.D.   On: 02/17/2016 23:13    Procedures Procedures (including critical care time)  CRITICAL CARE Performed by: Vanetta Mulders Total critical care time: 30 minutes Critical care time was exclusive of separately billable procedures and treating other patients. Critical care was necessary to treat or prevent imminent or life-threatening deterioration. Critical care was time spent personally by me on the following activities: development of treatment plan with patient and/or surrogate as well as nursing, discussions with consultants, evaluation of patient's response to treatment, examination of patient, obtaining history from patient or surrogate, ordering and performing treatments and interventions, ordering and review of laboratory studies, ordering and review of radiographic studies, pulse oximetry and re-evaluation of patient's condition.   Medications Ordered in ED Medications  0.9 %  sodium chloride infusion ( Intravenous New Bag/Given 02/17/16 1630)  metoprolol (LOPRESSOR) injection 5 mg (5 mg Intravenous Given 02/17/16 2145)  ciprofloxacin (CIPRO) IVPB 400 mg (not administered)  sodium chloride 0.9 % bolus 500 mL (0 mLs Intravenous Stopped 02/17/16 1846)  ondansetron (ZOFRAN) injection 4 mg (4 mg Intravenous Given 02/17/16 1534)  LORazepam (ATIVAN) injection 1 mg (1 mg Intravenous Given 02/17/16 1600)  potassium chloride 10 mEq in 100 mL IVPB (0 mEq Intravenous Stopped 02/17/16 2122)  potassium chloride 10 mEq in 100 mL IVPB (0 mEq Intravenous Stopped 02/17/16 2015)  potassium chloride SA  (K-DUR,KLOR-CON) CR tablet 40 mEq (40 mEq Oral Given 02/17/16 1851)  insulin aspart (novoLOG) injection 10 Units (10 Units Intravenous Given 02/17/16 1850)  iopamidol (ISOVUE-300) 61 % injection (  Contrast Given 02/17/16 1845)     Initial Impression / Assessment and Plan / ED Course  I have reviewed the triage vital signs and the nursing notes.  Pertinent labs & imaging results that were available during my care of the patient were reviewed by me and considered in my medical decision making (see chart for details).  Clinical Course   Patients clinical course with several developments. Patient initial complaint for high blood sugar and vomiting for 2 days did reveal elevated blood sugar in the 400s. Patient given some IV insulin and fluid for this. Also patient was significant hypokalemia requiring IV potassium as well as oral potassium but she vomited the oral potassium up. In addition patient had at least 3 runs of supraventricular tachycardia. The third run persisted and required IV Lopressor. Patient normally on beta blocker twice a day and had not been taking any of her medicine. Urinalysis seems to be consistent with urinary tract infection. Lipase was slightly elevated. CT scan of the abdomen shows no acute process. May be some mild inflammation of the gallbladder and possible single stone.  Clinically patient does not have acute cholecystitis. However ultrasound in the morning would be prudent. In addition lipase was slightly elevated. Mild pancreatitis is possible.   Final Clinical Impressions(s) / ED Diagnoses   Final diagnoses:  Hypokalemia  Hyperglycemia  SVT (supraventricular tachycardia) (HCC)  UTI (lower urinary tract infection)  Generalized abdominal pain  Non-intractable vomiting with nausea, vomiting of unspecified type    New Prescriptions New Prescriptions   No medications on file     Vanetta Mulders, MD 02/17/16 1610    Vanetta Mulders, MD 02/17/16 2356

## 2016-02-17 NOTE — ED Notes (Signed)
MD notified of Pt's HR in 140s, attempts to vagal failed.

## 2016-02-18 ENCOUNTER — Encounter (HOSPITAL_COMMUNITY): Payer: Self-pay | Admitting: Emergency Medicine

## 2016-02-18 ENCOUNTER — Inpatient Hospital Stay (HOSPITAL_COMMUNITY): Payer: BLUE CROSS/BLUE SHIELD

## 2016-02-18 DIAGNOSIS — Z992 Dependence on renal dialysis: Secondary | ICD-10-CM

## 2016-02-18 DIAGNOSIS — N183 Chronic kidney disease, stage 3 (moderate): Secondary | ICD-10-CM

## 2016-02-18 DIAGNOSIS — L538 Other specified erythematous conditions: Secondary | ICD-10-CM

## 2016-02-18 DIAGNOSIS — R112 Nausea with vomiting, unspecified: Secondary | ICD-10-CM

## 2016-02-18 DIAGNOSIS — R1010 Upper abdominal pain, unspecified: Secondary | ICD-10-CM

## 2016-02-18 DIAGNOSIS — R1110 Vomiting, unspecified: Secondary | ICD-10-CM | POA: Diagnosis present

## 2016-02-18 DIAGNOSIS — D638 Anemia in other chronic diseases classified elsewhere: Secondary | ICD-10-CM

## 2016-02-18 DIAGNOSIS — G43A0 Cyclical vomiting, not intractable: Secondary | ICD-10-CM

## 2016-02-18 DIAGNOSIS — E876 Hypokalemia: Secondary | ICD-10-CM

## 2016-02-18 DIAGNOSIS — N390 Urinary tract infection, site not specified: Principal | ICD-10-CM

## 2016-02-18 DIAGNOSIS — R1084 Generalized abdominal pain: Secondary | ICD-10-CM

## 2016-02-18 DIAGNOSIS — I471 Supraventricular tachycardia: Secondary | ICD-10-CM | POA: Diagnosis present

## 2016-02-18 LAB — I-STAT TROPONIN, ED: Troponin i, poc: 0 ng/mL (ref 0.00–0.08)

## 2016-02-18 LAB — BASIC METABOLIC PANEL
Anion gap: 9 (ref 5–15)
BUN: 15 mg/dL (ref 6–20)
CO2: 29 mmol/L (ref 22–32)
Calcium: 8.5 mg/dL — ABNORMAL LOW (ref 8.9–10.3)
Chloride: 102 mmol/L (ref 101–111)
Creatinine, Ser: 1.03 mg/dL — ABNORMAL HIGH (ref 0.44–1.00)
GFR calc Af Amer: >60 mL/min (ref >60)
GFR, EST NON AFRICAN AMERICAN: 58 mL/min — AB (ref >60)
GLUCOSE: 355 mg/dL — AB (ref 65–99)
POTASSIUM: 3.2 mmol/L — AB (ref 3.5–5.1)
Sodium: 140 mmol/L (ref 135–145)

## 2016-02-18 LAB — LACTIC ACID, PLASMA
LACTIC ACID, VENOUS: 1.4 mmol/L (ref 0.5–1.9)
Lactic Acid, Venous: 1.3 mmol/L (ref 0.5–1.9)

## 2016-02-18 LAB — CBC
HEMATOCRIT: 32.9 % — AB (ref 36.0–46.0)
Hemoglobin: 10.8 g/dL — ABNORMAL LOW (ref 12.0–15.0)
MCH: 30.8 pg (ref 26.0–34.0)
MCHC: 32.8 g/dL (ref 30.0–36.0)
MCV: 93.7 fL (ref 78.0–100.0)
Platelets: 312 10*3/uL (ref 150–400)
RBC: 3.51 MIL/uL — ABNORMAL LOW (ref 3.87–5.11)
RDW: 14.4 % (ref 11.5–15.5)
WBC: 17.2 10*3/uL — ABNORMAL HIGH (ref 4.0–10.5)

## 2016-02-18 LAB — MAGNESIUM
MAGNESIUM: 1.4 mg/dL — AB (ref 1.7–2.4)
MAGNESIUM: 1.5 mg/dL — AB (ref 1.7–2.4)

## 2016-02-18 LAB — BRAIN NATRIURETIC PEPTIDE: B Natriuretic Peptide: 1299 pg/mL — ABNORMAL HIGH (ref 0.0–100.0)

## 2016-02-18 LAB — TROPONIN I
TROPONIN I: 0.03 ng/mL — AB (ref <0.03)
Troponin I: 0.03 ng/mL (ref <0.03)

## 2016-02-18 LAB — GLUCOSE, CAPILLARY
GLUCOSE-CAPILLARY: 245 mg/dL — AB (ref 65–99)
Glucose-Capillary: 319 mg/dL — ABNORMAL HIGH (ref 65–99)

## 2016-02-18 MED ORDER — ASPIRIN EC 325 MG PO TBEC
325.0000 mg | DELAYED_RELEASE_TABLET | Freq: Every day | ORAL | Status: DC
  Administered 2016-02-18: 325 mg via ORAL
  Filled 2016-02-18: qty 1

## 2016-02-18 MED ORDER — SODIUM CHLORIDE 0.9 % IV SOLN: INTRAVENOUS | Status: DC

## 2016-02-18 MED ORDER — CIPROFLOXACIN IN D5W 400 MG/200ML IV SOLN: 400.0000 mg | INTRAVENOUS | Status: DC

## 2016-02-18 MED ORDER — INSULIN GLARGINE 100 UNIT/ML ~~LOC~~ SOLN
18.0000 [IU] | Freq: Every day | SUBCUTANEOUS | Status: DC
  Filled 2016-02-18 (×2): qty 0.18

## 2016-02-18 MED ORDER — ENOXAPARIN SODIUM 40 MG/0.4ML ~~LOC~~ SOLN: 40.0000 mg | Freq: Every day | SUBCUTANEOUS | Status: DC

## 2016-02-18 MED ORDER — METOCLOPRAMIDE HCL 5 MG/ML IJ SOLN
10.0000 mg | Freq: Three times a day (TID) | INTRAMUSCULAR | Status: DC
  Administered 2016-02-18: 10 mg via INTRAVENOUS
  Filled 2016-02-18: qty 2

## 2016-02-18 MED ORDER — PANTOPRAZOLE SODIUM 40 MG PO TBEC
40.0000 mg | DELAYED_RELEASE_TABLET | Freq: Every day | ORAL | Status: DC
  Administered 2016-02-18: 40 mg via ORAL
  Filled 2016-02-18: qty 1

## 2016-02-18 MED ORDER — METOPROLOL TARTRATE 25 MG PO TABS
25.0000 mg | ORAL_TABLET | Freq: Two times a day (BID) | ORAL | Status: DC
  Administered 2016-02-18: 25 mg via ORAL
  Filled 2016-02-18: qty 1

## 2016-02-18 MED ORDER — INSULIN ASPART 100 UNIT/ML ~~LOC~~ SOLN
0.0000 [IU] | Freq: Three times a day (TID) | SUBCUTANEOUS | Status: DC
  Administered 2016-02-18: 3 [IU] via SUBCUTANEOUS
  Administered 2016-02-18: 7 [IU] via SUBCUTANEOUS

## 2016-02-18 MED ORDER — ONDANSETRON HCL 4 MG PO TABS: 4.0000 mg | ORAL_TABLET | Freq: Four times a day (QID) | ORAL | Status: DC | PRN

## 2016-02-18 MED ORDER — ONDANSETRON HCL 4 MG/2ML IJ SOLN: 4.0000 mg | Freq: Four times a day (QID) | INTRAMUSCULAR | Status: DC | PRN

## 2016-02-18 MED ORDER — ALBUTEROL SULFATE (2.5 MG/3ML) 0.083% IN NEBU: 3.0000 mL | INHALATION_SOLUTION | Freq: Four times a day (QID) | RESPIRATORY_TRACT | Status: DC | PRN

## 2016-02-18 MED ORDER — MAGNESIUM SULFATE 2 GM/50ML IV SOLN
2.0000 g | Freq: Once | INTRAVENOUS | Status: AC
  Administered 2016-02-18: 2 g via INTRAVENOUS
  Filled 2016-02-18: qty 50

## 2016-02-18 MED ORDER — SODIUM CHLORIDE 0.9% FLUSH
3.0000 mL | Freq: Two times a day (BID) | INTRAVENOUS | Status: DC
Start: 2016-02-18 — End: 2016-02-18
  Administered 2016-02-18 (×2): 3 mL via INTRAVENOUS

## 2016-02-18 MED ORDER — POTASSIUM CHLORIDE IN NACL 20-0.9 MEQ/L-% IV SOLN
INTRAVENOUS | Status: AC
  Administered 2016-02-18: 01:00:00 via INTRAVENOUS

## 2016-02-18 MED ORDER — CIPROFLOXACIN HCL 500 MG PO TABS: 500.0000 mg | ORAL_TABLET | Freq: Two times a day (BID) | ORAL | 0 refills | Status: DC

## 2016-02-18 MED ORDER — METOPROLOL TARTRATE 25 MG PO TABS: 25.0000 mg | ORAL_TABLET | Freq: Once | ORAL | Status: DC

## 2016-02-18 NOTE — Progress Notes (Signed)
Patient discharged with instructions given on medications,and follow up visits,patient verbalized understanding. Prescriptions sent with patient. Staff to accompany patient to awaiting vehicle. 

## 2016-02-18 NOTE — ED Notes (Signed)
[place] to [place], [place] fund[place]

## 2016-02-18 NOTE — ED Notes (Addendum)
Dr Onalee Huaavid called re blood pressure- she reports pt can go to the floor with that blood pressure

## 2016-02-18 NOTE — H&P (Signed)
History and Physical    [name] ZOX:[id number] DOB: [birth year] DOA: 02/17/2016  PCP: [name], MD  Patient coming from:  home  Chief Complaint:   vomiting  HPI: [name] is a 60 y.o. female with medical history significant of IDDM, GERD, HTN, HLD comes in with over one day of nausea and vomiting.  Pt says about 3 days ago she went to her PCP for this rash she had (has not been on any new meds, same rash occurred in April of this year) and was put on oral prednisone.  She started taking that and she doesn't know If that has made her sick.  But yesterday she started vomiting, and having some epigastric discomfort.  Pt reports this has happened several times in the past and last week she was hospitalized at [facility] for the same thing and was told she had reflux.  (d/c on 8/25 with gastroenteritis).  She has had several hospitalizations for same, and no definitive answer.  Pt does not know if her n/v is related to food intake.  No relieveing factors, nothing makes it worse.  No prior evalluation with HIDA or gastric emptying study.  Pt had several episodes of psvt in the ED and was given lopressor, now NSR.  Pt still feels very nauseated.  Denies any fevers.  No diarrhea.  No sob or cough.  No urinary symptoms.   Review of Systems: As per HPI otherwise 10 point review of systems negative.   Past Medical History:  Diagnosis Date  . Anxiety   . Depression   . GERD (gastroesophageal reflux disease)   . High cholesterol   . Hypertension   . Type II diabetes mellitus (HCC)     Past Surgical History:  Procedure Laterality Date  . APPLICATION OF WOUND VAC Right 10/25/2015   Procedure: APPLICATION OF WOUND VAC;  Surgeon: Franky Macho, MD;  Location: [facility];  Service: General;  Laterality: Right;  . CESAREAN SECTION  1984  . INCISION AND DRAINAGE Right 08/09/2015   Procedure: INCISION AND DRAINAGE;  Surgeon: De Blanch Kinsinger, MD;  Location: [facility];  Service: General;   Laterality: Right;  . INCISION AND DRAINAGE ABSCESS Right 08/08/2015   Procedure: Irrigation and Debridement of Right Lower Ext.;  Surgeon: Axel Filler, MD;  Location: [facility] OR;  Service: General;  Laterality: Right;  . INCISION AND DRAINAGE ABSCESS Right 10/25/2015   Procedure: INCISION AND DRAINAGE ABSCESS RIGHT AXILLARY ABSCESS;  Surgeon: Franky Macho, MD;  Location: [facility];  Service: General;  Laterality: Right;     reports that she has been smoking Cigarettes.  She has a 3.50 pack-year smoking history. She has never used smokeless tobacco. She reports that she drinks about 0.6 oz of alcohol per week . She reports that she does not use drugs.  Allergies  Allergen Reactions  . Other     Per pt some type of mycin "some antibiotic sent me into renal failure."  . Penicillins Rash    Tolerated Zosyn in 07/2015 Has patient had a PCN reaction causing immediate rash, facial/tongue/throat swelling, SOB or lightheadedness with hypotension: No Has patient had a PCN reaction causing severe rash involving mucus membranes or skin necrosis: No Has patient had a PCN reaction that required hospitalization No Has patient had a PCN reaction occurring within the last 10 years: No If all of the above answers are "NO", then may proceed with Cephalosporin use.     No family history on file.  Prior to Admission medications   Medication Sig Start Date End Date Taking? Authorizing Provider  albuterol (PROAIR HFA) 108 (90 Base) MCG/ACT inhaler Inhale 2 puffs into the lungs every 6 (six) hours as needed for wheezing or shortness of breath.    Historical Provider, MD  aspirin EC 325 MG tablet Take 325 mg by mouth daily.    Historical Provider, MD  clobetasol cream (TEMOVATE) 0.05 % Apply topically 2 (two) times daily. 09/21/15   Leroy Sea, MD  collagenase (SANTYL) ointment Apply in a nickel thickness to left achilles wound in the wound bed only, being careful not to get on surrounding skin.  Cover with dry  gauze. 02/03/16   Beather Arbour, MD  FLUoxetine (PROZAC) 20 MG capsule Take 20 mg by mouth 2 (two) times daily.  05/11/15   Historical Provider, MD  hydrochlorothiazide (HYDRODIURIL) 25 MG tablet Take 1 tablet (25 mg total) by mouth daily. 02/03/16   Beather Arbour, MD  HYDROmorphone (DILAUDID) 4 MG tablet Take 4 mg by mouth 2 (two) times daily as needed. For pain 11/09/15   Historical Provider, MD  insulin aspart (NOVOLOG FLEXPEN) 100 UNIT/ML FlexPen Inject 3 Units into the skin 3 (three) times daily with meals.    Historical Provider, MD  insulin glargine (LANTUS) 100 UNIT/ML injection Inject 0.18 mLs (18 Units total) into the skin 2 (two) times daily. Patient taking differently: Inject 18 Units into the skin at bedtime.  08/15/15   Leroy Sea, MD  lisinopril (PRINIVIL,ZESTRIL) 20 MG tablet Take 1 tablet (20 mg total) by mouth daily. 02/03/16   Beather Arbour, MD  metoprolol tartrate (LOPRESSOR) 25 MG tablet Take 1 tablet (25 mg total) by mouth 2 (two) times daily. 09/21/15   Leroy Sea, MD  Multiple Vitamin (MULTIVITAMIN WITH MINERALS) TABS tablet Take 1 tablet by mouth daily. 10/26/15   Henderson Cloud, MD  pantoprazole (PROTONIX) 40 MG tablet Take 1 tablet (40 mg total) by mouth daily. 02/03/16   Beather Arbour, MD  pravastatin (PRAVACHOL) 40 MG tablet Take 40 mg by mouth at bedtime.  05/11/15   Historical Provider, MD    Physical Exam: Vitals:   02/17/16 2145 02/17/16 2150 02/17/16 2200 02/17/16 2300  BP: 180/77 173/78 182/92 182/91  Pulse: 106 100 91 95  Resp: 15 13 16 25   Temp:      TempSrc:      SpO2: 94% 95% 94% 96%      Constitutional: NAD, calm, comfortable Vitals:   02/17/16 2145 02/17/16 2150 02/17/16 2200 02/17/16 2300  BP: 180/77 173/78 182/92 182/91  Pulse: 106 100 91 95  Resp: 15 13 16 25   Temp:      TempSrc:      SpO2: 94% 95% 94% 96%   Eyes: PERRL, lids and conjunctivae normal ENMT: Mucous membranes are moist. Posterior pharynx clear of any exudate  or lesions.Normal dentition.  Neck: normal, supple, no masses, no thyromegaly Respiratory: clear to auscultation bilaterally, no wheezing, no crackles. Normal respiratory effort. No accessory muscle use.  Cardiovascular: Regular rate and rhythm, no murmurs / rubs / gallops. No extremity edema. 2+ pedal pulses. No carotid bruits.  Abdomen: no tenderness, no masses palpated. No hepatosplenomegaly. Bowel sounds positive.  Musculoskeletal: no clubbing / cyanosis. No joint deformity upper and lower extremities. Good ROM, no contractures. Normal muscle tone.  Skin: exfoliating peelign maculopap rashes diffuse, lesions, ulcers. No induration Neurologic: CN 2-12 grossly intact. Sensation intact, DTR normal. Strength 5/5  in all 4.  Psychiatric: Normal judgment and insight. Alert and oriented x 3. Normal mood.    Labs on Admission: I have personally reviewed following labs and imaging studies  CBC:  Recent Labs Lab 02/17/16 1633  WBC 15.3*  NEUTROABS 13.4*  HGB 11.0*  HCT 33.7*  MCV 93.4  PLT 313   Basic Metabolic Panel:  Recent Labs Lab 02/17/16 1633  NA 137  K 2.7*  CL 98*  CO2 29  GLUCOSE 413*  BUN 18  CREATININE 1.10*  CALCIUM 9.0   GFR: CrCl cannot be calculated (Unknown ideal weight.). Liver Function Tests:  Recent Labs Lab 02/17/16 1633  AST 12*  ALT 12*  ALKPHOS 138*  BILITOT 0.5  PROT 7.8  ALBUMIN 2.8*    Recent Labs Lab 02/17/16 1633  LIPASE 73*   CBG:  Recent Labs Lab 02/17/16 1624 02/17/16 1840 02/17/16 2042  GLUCAP 382* 386* 295*   Urine analysis:    Component Value Date/Time   COLORURINE YELLOW 02/17/2016 1522   APPEARANCEUR CLEAR 02/17/2016 1522   LABSPEC 1.015 02/17/2016 1522   PHURINE 6.0 02/17/2016 1522   GLUCOSEU >1000 (A) 02/17/2016 1522   HGBUR SMALL (A) 02/17/2016 1522   BILIRUBINUR NEGATIVE 02/17/2016 1522   KETONESUR TRACE (A) 02/17/2016 1522   PROTEINUR 100 (A) 02/17/2016 1522   NITRITE NEGATIVE 02/17/2016 1522    LEUKOCYTESUR NEGATIVE 02/17/2016 1522   Radiological Exams on Admission: Ct Abdomen Pelvis W Contrast  Result Date: 02/17/2016 CLINICAL DATA:  60 year old female with vomiting and abdominal pain. EXAM: CT ABDOMEN AND PELVIS WITH CONTRAST TECHNIQUE: Multidetector CT imaging of the abdomen and pelvis was performed using the standard protocol following bolus administration of intravenous contrast. CONTRAST:  1 ISOVUE-300 IOPAMIDOL (ISOVUE-300) INJECTION 61% COMPARISON:  CT dated 02/01/2016 FINDINGS: Partially visualized small bilateral pleural effusions relatively similar to prior study. There are bibasilar interstitial prominence and interlobular septal thickening most compatible with congestive changes and edema. There is mild cardiomegaly. No intra-abdominal free air. No free fluid. There is mild diffuse mesenteric stranding. There is slight heterogeneity and coarsened appearance of the liver which may be related to underlying fatty infiltration or hepatitis versus early cirrhosis. Correlation with clinical exam and LFTs recommended. No intrahepatic biliary ductal dilatation noted. The gallbladder is only partially distended. Tiny focus of air within the gallbladder may be related to noncalcified stone. There is thickened appearance of the gallbladder wall which may partly be related to under distention or liver disease. Acute cholecystitis is not excluded further evaluation with ultrasound recommended. The common bile duct appears patent and unremarkable. The pancreas, spleen, adrenal glands, kidneys, visualized ureters, and urinary bladder appear unremarkable. Mild bilateral perinephric stranding again noted, nonspecific. Correlation with urinalysis and renal function tests recommended. Small amount of air within the urinary bladder likely iatrogenic. Correlation with history of recent instrumentation recommended. Small uterine calcified fibroids. The ovaries are grossly unremarkable. Evaluation of the bowel  is limited in the absence of oral contrast. There is no evidence of bowel obstruction or active inflammation. Normal appendix. There are multiple small supraumbilical hernias. There is protrusion of a short segment of transverse colon in the uppermost hernia, similar to prior study without associated obstruction or inflammation. There is advanced aortoiliac atherosclerotic disease. The origins of the celiac axis, SMA, IMA as well as the origins of the renal arteries appear patent. No portal venous gas identified. The SMV, splenic vein, and main portal vein are patent. There is no adenopathy. Midline vertical anterior pelvic wall incisional  scar noted. There is diffuse subcutaneous soft tissue edema and anasarca. No fluid collection. There is degenerative changes of the spine. No acute fracture. IMPRESSION: Partially visualized bilateral pleural effusions, pulmonary edema and anasarca. Mild heterogeneity of the liver. Correlation with clinical exam and liver function tests recommended. Mild thickened appearance of the gallbladder with possible small noncalcified stone. Further evaluation with right upper quadrant ultrasound recommended. No evidence of bowel obstruction or active inflammation. Normal appendix. Ventral/supraumbilical hernia containing a short segment of transverse colon without evidence of obstruction or inflammation. Electronically Signed   By: Elgie CollardArash  Radparvar M.D.   On: 02/17/2016 23:13    EKG: Independently reviewed. Sinus tach rbbb no changes from previous ekg  Assessment/Plan Principal Problem:   Abdominal pain- pt has had several prev hospitalizations for same issues.  Will obtain abdominal us.  May need HIDA scan, another consideration is gastroparesis, has not had gastric emptying study.  Will try reglan tonight and see if that helps.  abd exam is benign and nonacute.    Active Problems:   UTI (lower urinary tract infection)- cipro iv, urine cx obtained.  Check lactic acid level.   May be reason for her vomiting but will investigate other causes    Paroxysmal SVT (supraventricular tachycardia) (HCC)- has not taken meds in several days, this is new  But may be due to dehydration, low k level.  nsr with iv lopressor.  Give metoprolol now and monitor.    Emesis-  As above    Hypokalemia- replete, check mag level, due to vomiting    IDDM (insulin dependent diabetes mellitus) (HCC)- place on ssi    Hypertension- noted, give bblocker now    Generalized exfoliative dermatitis- unclear etiology , had same type of rash in April.  No infection noted    HLD (hyperlipidemia)- noted    Anemia of chronic disease- noted    CKD (chronic kidney disease), stage III- stable    h/o dialysis needs spring 2017- noted   pulm edema noted on ct scan with effusions which she had last month, pt not volume overloaded.  Was thought to be due to some diastolic dysfunction and was diuresed .  Will check bnp level.  Will not repeat echo, as this is incidental finding on ct scan and not related to her current symptoms.  Will obtain 2 view cxr in the am.  DVT prophylaxis:   scds Code Status:   Full code Family Communication:  none Disposition Plan:   Per day team Consults called:  none Admission status:  admit   Laurene Melendrez A MD [facility]  If 7PM-7AM, please contact night-coverage [email or web address] Password [email or web address]  02/18/2016, 12:11 AM

## 2016-02-18 NOTE — Progress Notes (Signed)
This RN received report from Tenet HealthcareMatisha Tuttle. -RN Earna Coderuttle was questioned regarding the patient's 2344 sBP  RN Tuttle rechecked the patient's BP and the patient's sBP is  Currently 188 with a HR of 128. RN tuttle reported the admitting MD was consulted and a green light to send the patient to the floor was given. The patient is in route to the floor 335.

## 2016-02-18 NOTE — ED Notes (Signed)
Call for report - cannot give nurse unavailable for report

## 2016-02-18 NOTE — Discharge Summary (Signed)
Physician Discharge Summary  [name] RUE:[id number] DOB: [birth year] DOA: 02/17/2016  PCP: [name], MD  Admit date: 02/17/2016 Discharge date: 02/18/2016  Time spent: 45 minutes  Recommendations for Outpatient Follow-up:  -Will be discharged home today. -Cipro x 5 days for treatment of UTI. -Advised to follow up with PCP in 2 weeks.   Discharge Diagnoses:  Principal Problem:   Abdominal pain Active Problems:   IDDM (insulin dependent diabetes mellitus) (HCC)   Hypertension   Generalized exfoliative dermatitis   UTI (lower urinary tract infection)   HLD (hyperlipidemia)   Anemia of chronic disease   CKD (chronic kidney disease), stage III   Hypokalemia   h/o dialysis needs spring 2017   Paroxysmal SVT (supraventricular tachycardia) (HCC)   Emesis   Discharge Condition: Stable and improved  Filed Weights   02/18/16 0052  Weight: 90.2 kg (198 lb 13.7 oz)    History of present illness:  As per Dr. Onalee Hua on 9/10: [name] is a 60 y.o. female with medical history significant of IDDM, GERD, HTN, HLD comes in with over one day of nausea and vomiting.  Pt says about 3 days ago she went to her PCP for this rash she had (has not been on any new meds, same rash occurred in April of this year) and was put on oral prednisone.  She started taking that and she doesn't know If that has made her sick.  But yesterday she started vomiting, and having some epigastric discomfort.  Pt reports this has happened several times in the past and last week she was hospitalized at [facility] for the same thing and was told she had reflux.  (d/c on 8/25 with gastroenteritis).  She has had several hospitalizations for same, and no definitive answer.  Pt does not know if her n/v is related to food intake.  No relieveing factors, nothing makes it worse.  No prior evalluation with HIDA or gastric emptying study.  Pt had several episodes of psvt in the ED and was given lopressor, now  NSR.  Pt still feels very nauseated.  Denies any fevers.  No diarrhea.  No sob or cough.  No urinary symptoms.  Hospital Course:   Abdominal Pain/Emesis -Etiology not entirely clear, but has completely resolved and patient is anxious for DC home today. -Abdominal US with cholelithiasis. -Uti may have played a role.  UTI -Cx data pending. -Will DC on a 5 day course of ciprofloxacin.  Rest of chronic conditions have been stable  Procedures:  None   Consultations:  None  Discharge Instructions  Discharge Instructions    Diet - low sodium heart healthy    Complete by:  As directed   Increase activity slowly    Complete by:  As directed       Medication List    STOP taking these medications   hydrochlorothiazide 25 MG tablet Commonly known as:  HYDRODIURIL     TAKE these medications   aspirin EC 325 MG tablet Take 325 mg by mouth daily.   ciprofloxacin 500 MG tablet Commonly known as:  CIPRO Take 1 tablet (500 mg total) by mouth 2 (two) times daily.   clobetasol cream 0.05 % Commonly known as:  TEMOVATE Apply topically 2 (two) times daily.   collagenase ointment Commonly known as:  SANTYL Apply in a nickel thickness to left achilles wound in the wound bed only, being careful not to get on surrounding skin.  Cover with dry gauze.  FLUoxetine 20 MG capsule Commonly known as:  PROZAC Take 20 mg by mouth 2 (two) times daily.   HYDROmorphone 4 MG tablet Commonly known as:  DILAUDID Take 4 mg by mouth 2 (two) times daily as needed. For pain   insulin glargine 100 UNIT/ML injection Commonly known as:  LANTUS Inject 0.18 mLs (18 Units total) into the skin 2 (two) times daily. What changed:  when to take this   lisinopril 20 MG tablet Commonly known as:  PRINIVIL,ZESTRIL Take 1 tablet (20 mg total) by mouth daily.   metoprolol tartrate 25 MG tablet Commonly known as:  LOPRESSOR Take 1 tablet (25 mg total) by mouth 2 (two) times daily.   multivitamin with  minerals Tabs tablet Take 1 tablet by mouth daily.   NOVOLOG FLEXPEN 100 UNIT/ML FlexPen Generic drug:  insulin aspart Inject 3 Units into the skin 3 (three) times daily with meals.   pantoprazole 40 MG tablet Commonly known as:  PROTONIX Take 1 tablet (40 mg total) by mouth daily.   pravastatin 40 MG tablet Commonly known as:  PRAVACHOL Take 40 mg by mouth at bedtime.   predniSONE 20 MG tablet Commonly known as:  DELTASONE Take 40 mg by mouth daily with breakfast.   PROAIR HFA 108 (90 Base) MCG/ACT inhaler Generic drug:  albuterol Inhale 2 puffs into the lungs every 6 (six) hours as needed for wheezing or shortness of breath.      Allergies  Allergen Reactions  . Other     Per pt some type of mycin "some antibiotic sent me into renal failure."  . Penicillins Rash    Tolerated Zosyn in 07/2015 Has patient had a PCN reaction causing immediate rash, facial/tongue/throat swelling, SOB or lightheadedness with hypotension: No Has patient had a PCN reaction causing severe rash involving mucus membranes or skin necrosis: No Has patient had a PCN reaction that required hospitalization No Has patient had a PCN reaction occurring within the last 10 years: No If all of the above answers are "NO", then may proceed with Cephalosporin use.    Follow-up Information    TAPPER,[name], MD. Schedule an appointment as soon as possible for a visit in 2 week(s).   Specialty:  Family Medicine Contact information: [address] [place] [address] [phone]            The results of significant diagnostics from this hospitalization (including imaging, microbiology, ancillary and laboratory) are listed below for reference.    Significant Diagnostic Studies: Dg Chest 2 View  Result Date: 02/18/2016 CLINICAL DATA:  Vomiting. EXAM: CHEST  2 VIEW COMPARISON:  Multiple chest x-rays since November 15, 2015 FINDINGS: Cardiomegaly. No pneumothorax. The hila and mediastinum are unchanged.  There is increased opacity in the medial right lung base suspicious for developing infiltrate. Mild increased interstitial markings without overt edema. No other acute abnormalities. IMPRESSION: Opacity in the medial right lung base on the frontal view, noted to be in the middle lobe on the lateral view. There is also some patchy opacity in the left base. The findings are suspicious for multi focal infiltrate. Recommend follow-up to resolution. Pulmonary venous congestion without overt edema. Electronically Signed   By: Gerome Sam III M.D   On: 02/18/2016 10:26   Dg Chest 2 View  Result Date: 02/02/2016 CLINICAL DATA:  Dyspnea and weakness today. EXAM: CHEST  2 VIEW COMPARISON:  Single-view of the chest 02/01/2016. PA and lateral chest 11/15/2015. FINDINGS: There are small pleural effusions and  basilar airspace disease, worse on the left. Aeration in the left base appears mildly improved compared to the most recent examination. Changes on the right are stable in appearance. Cardiomegaly is noted. No pneumothorax. IMPRESSION: Small bilateral pleural effusions and basilar airspace disease which could be atelectasis or pneumonia. Aeration in the left lung base appears improved since the most recent examination. Electronically Signed   By: Drusilla Kanner M.D.   On: 02/02/2016 11:33   US Abdomen Complete  Result Date: 02/18/2016 CLINICAL DATA:  Increased LFTs. EXAM: ABDOMEN ULTRASOUND COMPLETE COMPARISON:  CT scan from yesterday. FINDINGS: Gallbladder: Imaging of the gallbladder suggests a wall echo shadow sign seen in cases of contracted gallbladder around intraluminal stones. This precludes reliable measurement of gallbladder wall thickness, but sonographer estimates 2-3 mm. Common bile duct: Diameter: 7 mm Liver: No focal lesion identified. Within normal limits in parenchymal echogenicity. IVC: No abnormality visualized. Pancreas: Not well seen secondary overlying midline bowel gas. Spleen: Size and  appearance within normal limits. Right Kidney: Length: 9.1 cm. Echogenicity within normal limits. No mass or hydronephrosis visualized. Left Kidney: Length: 12.3 cm. Echogenicity within normal limits. No mass or hydronephrosis visualized. Abdominal aorta: Poorly visualized secondary overlying bowel gas. Other findings: Right pleural effusion noted. IMPRESSION: 1. Imaging features suggest cholelithiasis with wall echo shadow sign. 2. Upper normal to mildly increased common duct diameter stable since CT scan yesterday. 3. Right pleural effusion. Electronically Signed   By: Kennith Center M.D.   On: 02/18/2016 10:07   Ct Abdomen Pelvis W Contrast  Result Date: 02/17/2016 CLINICAL DATA:  60 year old female with vomiting and abdominal pain. EXAM: CT ABDOMEN AND PELVIS WITH CONTRAST TECHNIQUE: Multidetector CT imaging of the abdomen and pelvis was performed using the standard protocol following bolus administration of intravenous contrast. CONTRAST:  1 ISOVUE-300 IOPAMIDOL (ISOVUE-300) INJECTION 61% COMPARISON:  CT dated 02/01/2016 FINDINGS: Partially visualized small bilateral pleural effusions relatively similar to prior study. There are bibasilar interstitial prominence and interlobular septal thickening most compatible with congestive changes and edema. There is mild cardiomegaly. No intra-abdominal free air. No free fluid. There is mild diffuse mesenteric stranding. There is slight heterogeneity and coarsened appearance of the liver which may be related to underlying fatty infiltration or hepatitis versus early cirrhosis. Correlation with clinical exam and LFTs recommended. No intrahepatic biliary ductal dilatation noted. The gallbladder is only partially distended. Tiny focus of air within the gallbladder may be related to noncalcified stone. There is thickened appearance of the gallbladder wall which may partly be related to under distention or liver disease. Acute cholecystitis is not excluded further evaluation  with ultrasound recommended. The common bile duct appears patent and unremarkable. The pancreas, spleen, adrenal glands, kidneys, visualized ureters, and urinary bladder appear unremarkable. Mild bilateral perinephric stranding again noted, nonspecific. Correlation with urinalysis and renal function tests recommended. Small amount of air within the urinary bladder likely iatrogenic. Correlation with history of recent instrumentation recommended. Small uterine calcified fibroids. The ovaries are grossly unremarkable. Evaluation of the bowel is limited in the absence of oral contrast. There is no evidence of bowel obstruction or active inflammation. Normal appendix. There are multiple small supraumbilical hernias. There is protrusion of a short segment of transverse colon in the uppermost hernia, similar to prior study without associated obstruction or inflammation. There is advanced aortoiliac atherosclerotic disease. The origins of the celiac axis, SMA, IMA as well as the origins of the renal arteries appear patent. No portal venous gas identified. The SMV, splenic vein, and main  portal vein are patent. There is no adenopathy. Midline vertical anterior pelvic wall incisional scar noted. There is diffuse subcutaneous soft tissue edema and anasarca. No fluid collection. There is degenerative changes of the spine. No acute fracture. IMPRESSION: Partially visualized bilateral pleural effusions, pulmonary edema and anasarca. Mild heterogeneity of the liver. Correlation with clinical exam and liver function tests recommended. Mild thickened appearance of the gallbladder with possible small noncalcified stone. Further evaluation with right upper quadrant ultrasound recommended. No evidence of bowel obstruction or active inflammation. Normal appendix. Ventral/supraumbilical hernia containing a short segment of transverse colon without evidence of obstruction or inflammation. Electronically Signed   By: Elgie CollardArash  Radparvar M.D.    On: 02/17/2016 23:13   Ct Abdomen Pelvis W Contrast  Result Date: 02/01/2016 CLINICAL DATA:  Abdominal pain, onset yesterday. Vomiting and diarrhea. EXAM: CT ABDOMEN AND PELVIS WITH CONTRAST TECHNIQUE: Multidetector CT imaging of the abdomen and pelvis was performed using the standard protocol following bolus administration of intravenous contrast. CONTRAST:  100mL ISOVUE-300 IOPAMIDOL (ISOVUE-300) INJECTION 61% COMPARISON:  CT 11/15/2015 FINDINGS: Lower chest: Bilateral pleural effusions, right greater than left. Adjacent compressive atelectasis. Cardiomegaly with trace pericardial effusion. Liver: Prominent size with steatosis.  No focal lesion. Hepatobiliary: Gallbladder physiologically distended, no calcified stone. No biliary dilatation. Pancreas: No ductal dilatation or inflammation. Spleen: Normal. Adrenal glands: No nodule. Kidneys: Symmetric renal enhancement. No hydronephrosis. There is symmetric peri-nephric edema and that is unchanged from prior CT. Symmetric excretion on delayed phase imaging. Small cyst in the upper left kidney. Stomach/Bowel: Small hiatal hernia. Stomach physiologically distended. There are no dilated or thickened small bowel loops. Ventral abdominal wall hernia contains nonobstructed noninflamed transverse colon. A few distal colonic diverticular without diverticulitis. Small volume of stool throughout the colon without colonic wall thickening. The appendix is normal. Vascular/Lymphatic: No retroperitoneal adenopathy. Abdominal aorta is normal in caliber. Atherosclerosis of the abdominal aorta and its branches without aneurysm. Reproductive: Calcified uterine fibroids.  Ovaries are quiescent. Bladder: Physiologically distended. Focus of intravesicular air. No definite bladder wall thickening. Other: No ascites or free air. No intra-abdominal abscess. Complex supraumbilical midline ventral abdominal wall hernias, containing fat. One of these hernias contains nonobstructed  transverse colon. There is a small umbilical hernia. Whole body wall edema is new. Musculoskeletal: There are no acute or suspicious osseous abnormalities. Stable degenerative change in the spine. IMPRESSION: 1. Fluid overload. Bilateral pleural effusions and whole body wall edema. 2. Symmetric bilateral perinephric edema is nonspecific, however unchanged from prior CT. Question underlying renal disease or pyelonephritis. 3. Focus of intravesicular air is likely secondary to instrumentation. Urinary tract infection could produce this appearance. 4. Ventral abdominal wall hernias, of which contains nonobstructed normal transverse colon. Additional hernias contain fat. 5. Atherosclerosis without aneurysm. Electronically Signed   By: Rubye OaksMelanie  Ehinger M.D.   On: 02/01/2016 06:59   Dg Abd Acute W/chest  Result Date: 02/01/2016 CLINICAL DATA:  Diffuse abdominal pain.  Nausea, vomiting, diarrhea. EXAM: DG ABDOMEN ACUTE W/ 1V CHEST COMPARISON:  Chest radiographs and abdominal CT 11/15/2015 FINDINGS: Mild cardiomegaly. Minimal vascular congestion without overt edema. Mild bibasilar atelectasis. No evidence pleural effusion. No evidence of free air. No dilated bowel loops to suggest obstruction. Small volume of colonic stool. Relative paucity of bowel gas is similar to scout from prior CT and normal for this patient. Calcified uterine fibroids. No acute osseous abnormality is seen. IMPRESSION: 1. No evidence of bowel obstruction or free air. 2. Cardiomegaly with vascular congestion, question fluid overload. Electronically Signed  By: Rubye Oaks M.D.   On: 02/01/2016 01:23    Microbiology: No results found for this or any previous visit (from the past 240 hour(s)).   Labs: Basic Metabolic Panel:  Recent Labs Lab 02/17/16 1633 02/17/16 2356 02/18/16 0512  NA 137  --  140  K 2.7*  --  3.2*  CL 98*  --  102  CO2 29  --  29  GLUCOSE 413*  --  355*  BUN 18  --  15  CREATININE 1.10*  --  1.03*    CALCIUM 9.0  --  8.5*  MG  --  1.4* 1.5*   Liver Function Tests:  Recent Labs Lab 02/17/16 1633  AST 12*  ALT 12*  ALKPHOS 138*  BILITOT 0.5  PROT 7.8  ALBUMIN 2.8*    Recent Labs Lab 02/17/16 1633  LIPASE 73*   No results for input(s): AMMONIA in the last 168 hours. CBC:  Recent Labs Lab 02/17/16 1633 02/18/16 0512  WBC 15.3* 17.2*  NEUTROABS 13.4*  --   HGB 11.0* 10.8*  HCT 33.7* 32.9*  MCV 93.4 93.7  PLT 313 312   Cardiac Enzymes:  Recent Labs Lab 02/18/16 0104 02/18/16 0644 02/18/16 1313  TROPONINI 0.03* 0.03* <0.03   BNP: BNP (last 3 results)  Recent Labs  02/01/16 0844 02/17/16 1623  BNP 1,090.5* 1,299.0*    ProBNP (last 3 results) No results for input(s): PROBNP in the last 8760 hours.  CBG:  Recent Labs Lab 02/17/16 1624 02/17/16 1840 02/17/16 2042 02/18/16 0737 02/18/16 1133  GLUCAP 382* 386* 295* 319* 245*       Signed:  HERNANDEZ ACOSTA,ESTELA  [facility] Pager: [phone] 02/18/2016, 4:43 PM

## 2016-02-18 NOTE — Progress Notes (Signed)
Alerted by tele that pulse has increased to 140's twice this AM.  Sustains for a few minutes then drops back to around 100.  PO metoprolol given.  BP 136/86.  Patient resting, not exerting at all.  MD made aware via text page. Will continue to monitor. Pt asymptomatic at this time.

## 2016-02-19 LAB — GLUCOSE, CAPILLARY: GLUCOSE-CAPILLARY: 400 mg/dL — AB (ref 65–99)

## 2016-02-19 LAB — HEMOGLOBIN A1C
Hgb A1c MFr Bld: 8.4 % — ABNORMAL HIGH (ref 4.8–5.6)
MEAN PLASMA GLUCOSE: 194 mg/dL

## 2016-02-21 ENCOUNTER — Encounter (HOSPITAL_COMMUNITY): Payer: Self-pay | Admitting: Emergency Medicine

## 2016-02-21 ENCOUNTER — Emergency Department (HOSPITAL_COMMUNITY)
Admission: EM | Admit: 2016-02-21 | Discharge: 2016-02-21 | Disposition: A | Discharge status: Home / Self Care | Payer: BLUE CROSS/BLUE SHIELD | Attending: Emergency Medicine | Admitting: Emergency Medicine

## 2016-02-21 DIAGNOSIS — Z7982 Long term (current) use of aspirin: Secondary | ICD-10-CM | POA: Diagnosis not present

## 2016-02-21 DIAGNOSIS — Z794 Long term (current) use of insulin: Secondary | ICD-10-CM | POA: Insufficient documentation

## 2016-02-21 DIAGNOSIS — N183 Chronic kidney disease, stage 3 (moderate): Secondary | ICD-10-CM | POA: Insufficient documentation

## 2016-02-21 DIAGNOSIS — R21 Rash and other nonspecific skin eruption: Secondary | ICD-10-CM | POA: Insufficient documentation

## 2016-02-21 DIAGNOSIS — F17210 Nicotine dependence, cigarettes, uncomplicated: Secondary | ICD-10-CM | POA: Diagnosis not present

## 2016-02-21 DIAGNOSIS — I129 Hypertensive chronic kidney disease with stage 1 through stage 4 chronic kidney disease, or unspecified chronic kidney disease: Secondary | ICD-10-CM | POA: Diagnosis not present

## 2016-02-21 DIAGNOSIS — E1122 Type 2 diabetes mellitus with diabetic chronic kidney disease: Secondary | ICD-10-CM | POA: Insufficient documentation

## 2016-02-21 DIAGNOSIS — Z79899 Other long term (current) drug therapy: Secondary | ICD-10-CM | POA: Insufficient documentation

## 2016-02-21 LAB — URINE CULTURE: Culture: >=100000 — AB

## 2016-02-21 NOTE — ED Triage Notes (Addendum)
Pt reports generalized rash x1 week. Pt reports given cream pt reports continued burning sensation. Pt currently on abx for UTI.

## 2016-02-21 NOTE — Discharge Instructions (Signed)
Continue current medications. 

## 2016-02-22 NOTE — ED Provider Notes (Signed)
[facility] Provider Note   CSN: [id number] Arrival date & time: 02/21/16  1236     History   Chief Complaint Chief Complaint  Patient presents with  . Rash    HPI [name] is a 60 y.o. female.  The history is provided by the patient. No language interpreter was used.  Rash   This is a recurrent problem. The problem has been gradually improving. The problem is associated with nothing. There has been no fever. The rash is present on the scalp and torso. The patient is experiencing no pain. The pain has been constant since onset. Associated symptoms include itching. She has tried nothing for the symptoms.  Pt has had a rash for several weeks.  Pt is on prednisone and an antibiotic.  Pt thinks rash is getting better.  Pt had to bring Grandchild in for cellulitis and she wants to get checked.   Pt thinks rash is better.  No fever, no systemic complaints today.    Past Medical History:  Diagnosis Date  . Anxiety   . Depression   . GERD (gastroesophageal reflux disease)   . High cholesterol   . Hypertension   . Type II diabetes mellitus [facility](HCC)     Patient Active Problem List   Diagnosis Date Noted  . h/o dialysis needs spring 2017 02/18/2016  . Paroxysmal SVT (supraventricular tachycardia) (HCC) 02/18/2016  . Emesis 02/18/2016  . Hypokalemia 02/17/2016  . Volume overload 02/02/2016  . Abdominal pain 02/01/2016  . CKD (chronic kidney disease), stage III 10/24/2015  . Abscess of axilla, right 10/23/2015  . HLD (hyperlipidemia) 10/12/2015  . Anemia of chronic disease 10/12/2015  . Depression 10/12/2015  . UTI (lower urinary tract infection) 09/12/2015  . Generalized exfoliative dermatitis 09/09/2015  . Normocytic anemia 09/09/2015  . Necrotizing fasciitis (HCC) of inner thigh 08/07/2015  . IDDM (insulin dependent diabetes mellitus) (HCC) 08/07/2015  . History of hypertension 08/07/2015  . Hypertension 08/07/2015    Past Surgical History:  Procedure  Laterality Date  . APPLICATION OF WOUND VAC Right 10/25/2015   Procedure: APPLICATION OF WOUND VAC;  Surgeon: Franky MachoMark Jenkins, MD;  Location: [facility];  Service: General;  Laterality: Right;  . CESAREAN SECTION  1984  . INCISION AND DRAINAGE Right 08/09/2015   Procedure: INCISION AND DRAINAGE;  Surgeon: De BlanchLuke Aaron Kinsinger, MD;  [facility];  Service: General;  Laterality: Right;  . INCISION AND DRAINAGE ABSCESS Right 08/08/2015   Procedure: Irrigation and Debridement of Right Lower Ext.;  Surgeon: Axel FillerArmando Ramirez, MD;  Location: [facility] OR;  Service: General;  Laterality: Right;  . INCISION AND DRAINAGE ABSCESS Right 10/25/2015   Procedure: INCISION AND DRAINAGE ABSCESS RIGHT AXILLARY ABSCESS;  Surgeon: Franky MachoMark Jenkins, MD;  Location: [facility];  Service: General;  Laterality: Right;    OB History    No data available       Home Medications    Prior to Admission medications   Medication Sig Start Date End Date Taking? Authorizing Provider  albuterol (PROAIR HFA) 108 (90 Base) MCG/ACT inhaler Inhale 2 puffs into the lungs every 6 (six) hours as needed for wheezing or shortness of breath.    Historical Provider, MD  aspirin EC 325 MG tablet Take 325 mg by mouth daily.    Historical Provider, MD  ciprofloxacin (CIPRO) 500 MG tablet Take 1 tablet (500 mg total) by mouth 2 (two) times daily. 02/18/16   Henderson CloudEstela Y Hernandez Acosta, MD  clobetasol cream (TEMOVATE) 0.05 % Apply topically  2 (two) times daily. 09/21/15   Leroy Sea, MD  collagenase (SANTYL) ointment Apply in a nickel thickness to left achilles wound in the wound bed only, being careful not to get on surrounding skin.  Cover with dry gauze. 02/03/16   Beather Arbour, MD  FLUoxetine (PROZAC) 20 MG capsule Take 20 mg by mouth 2 (two) times daily.  05/11/15   Historical Provider, MD  HYDROmorphone (DILAUDID) 4 MG tablet Take 4 mg by mouth 2 (two) times daily as needed. For pain 11/09/15   Historical Provider, MD  insulin aspart (NOVOLOG FLEXPEN) 100  UNIT/ML FlexPen Inject 3 Units into the skin 3 (three) times daily with meals.    Historical Provider, MD  insulin glargine (LANTUS) 100 UNIT/ML injection Inject 0.18 mLs (18 Units total) into the skin 2 (two) times daily. Patient taking differently: Inject 18 Units into the skin at bedtime.  08/15/15   Leroy Sea, MD  lisinopril (PRINIVIL,ZESTRIL) 20 MG tablet Take 1 tablet (20 mg total) by mouth daily. 02/03/16   Beather Arbour, MD  metoprolol tartrate (LOPRESSOR) 25 MG tablet Take 1 tablet (25 mg total) by mouth 2 (two) times daily. 09/21/15   Leroy Sea, MD  Multiple Vitamin (MULTIVITAMIN WITH MINERALS) TABS tablet Take 1 tablet by mouth daily. 10/26/15   Henderson Cloud, MD  pantoprazole (PROTONIX) 40 MG tablet Take 1 tablet (40 mg total) by mouth daily. 02/03/16   Beather Arbour, MD  pravastatin (PRAVACHOL) 40 MG tablet Take 40 mg by mouth at bedtime.  05/11/15   Historical Provider, MD  predniSONE (DELTASONE) 20 MG tablet Take 40 mg by mouth daily with breakfast.    Historical Provider, MD    Family History History reviewed. No pertinent family history.  Social History Social History  Substance Use Topics  . Smoking status: Current Every Day Smoker    Packs/day: 0.10    Years: 35.00    Types: Cigarettes  . Smokeless tobacco: Never Used  . Alcohol use 0.6 oz/week    1 Glasses of wine per week     Allergies   Other and Penicillins   Review of Systems Review of Systems  Skin: Positive for itching and rash.  All other systems reviewed and are negative.    Physical Exam Updated Vital Signs BP 108/65 (BP Location: Left Arm)   Pulse 82   Temp 98.4 F (36.9 C) (Oral)   Resp 16   Ht 5\' 3"  (1.6 m)   Wt 89.8 kg   SpO2 94%   BMI 35.07 kg/m   Physical Exam  Constitutional: She appears well-developed and well-nourished. No distress.  HENT:  Head: Normocephalic and atraumatic.  Eyes: Conjunctivae are normal.  Neck: Neck supple.  Cardiovascular: Normal  rate and regular rhythm.   No murmur heard. Pulmonary/Chest: Effort normal and breath sounds normal. No respiratory distress.  Abdominal: Soft. There is no tenderness.  Musculoskeletal: She exhibits no edema.  Neurological: She is alert.  Skin: Skin is warm and dry. Rash noted.  Skin peeling,  No obvious rash but she does have skin peeling full boday.  Psychiatric: She has a normal mood and affect.  Nursing note and vitals reviewed.    ED Treatments / Results  Labs (all labs ordered are listed, but only abnormal results are displayed) Labs Reviewed - No data to display  EKG  EKG Interpretation None       Radiology No results found.  Procedures Procedures (including critical care  time)  Medications Ordered in ED Medications - No data to display   Initial Impression / Assessment and Plan / ED Course  I have reviewed the triage vital signs and the nursing notes.  Pertinent labs & imaging results that were available during my care of the patient were reviewed by me and considered in my medical decision making (see chart for details).  Clinical Course    Pt's vitals are normal.  Rash appears to be resolving.  I advised continue current medications.  Follow up with primary MD for recheck  Final Clinical Impressions(s) / ED Diagnoses   Final diagnoses:  Rash    New Prescriptions Discharge Medication List as of 02/21/2016  2:13 PM    An After Visit Summary was printed and given to the patient.   Lonia Skinner [place], PA-C 02/22/16 1824    Mancel Bale, MD 02/26/16 [phone]

## 2016-02-27 ENCOUNTER — Inpatient Hospital Stay (HOSPITAL_COMMUNITY)
Admission: EM | Admit: 2016-02-27 | Discharge: 2016-03-05 | DRG: 602 | Disposition: A | Discharge status: Home Health | Payer: BLUE CROSS/BLUE SHIELD | Attending: Internal Medicine | Admitting: Internal Medicine

## 2016-02-27 ENCOUNTER — Emergency Department (HOSPITAL_COMMUNITY): Payer: BLUE CROSS/BLUE SHIELD

## 2016-02-27 ENCOUNTER — Encounter (HOSPITAL_COMMUNITY): Payer: Self-pay | Admitting: *Deleted

## 2016-02-27 DIAGNOSIS — I129 Hypertensive chronic kidney disease with stage 1 through stage 4 chronic kidney disease, or unspecified chronic kidney disease: Secondary | ICD-10-CM | POA: Diagnosis present

## 2016-02-27 DIAGNOSIS — E785 Hyperlipidemia, unspecified: Secondary | ICD-10-CM | POA: Diagnosis present

## 2016-02-27 DIAGNOSIS — L02212 Cutaneous abscess of back [any part, except buttock]: Secondary | ICD-10-CM | POA: Diagnosis present

## 2016-02-27 DIAGNOSIS — K219 Gastro-esophageal reflux disease without esophagitis: Secondary | ICD-10-CM | POA: Diagnosis present

## 2016-02-27 DIAGNOSIS — D649 Anemia, unspecified: Secondary | ICD-10-CM | POA: Diagnosis present

## 2016-02-27 DIAGNOSIS — E11649 Type 2 diabetes mellitus with hypoglycemia without coma: Secondary | ICD-10-CM | POA: Diagnosis present

## 2016-02-27 DIAGNOSIS — E875 Hyperkalemia: Secondary | ICD-10-CM | POA: Diagnosis present

## 2016-02-27 DIAGNOSIS — E1100 Type 2 diabetes mellitus with hyperosmolarity without nonketotic hyperglycemic-hyperosmolar coma (NKHHC): Secondary | ICD-10-CM | POA: Diagnosis present

## 2016-02-27 DIAGNOSIS — Z7982 Long term (current) use of aspirin: Secondary | ICD-10-CM

## 2016-02-27 DIAGNOSIS — I471 Supraventricular tachycardia: Secondary | ICD-10-CM | POA: Diagnosis present

## 2016-02-27 DIAGNOSIS — L089 Local infection of the skin and subcutaneous tissue, unspecified: Secondary | ICD-10-CM

## 2016-02-27 DIAGNOSIS — L02213 Cutaneous abscess of chest wall: Secondary | ICD-10-CM | POA: Diagnosis present

## 2016-02-27 DIAGNOSIS — E870 Hyperosmolality and hypernatremia: Secondary | ICD-10-CM | POA: Diagnosis present

## 2016-02-27 DIAGNOSIS — Z794 Long term (current) use of insulin: Secondary | ICD-10-CM | POA: Diagnosis not present

## 2016-02-27 DIAGNOSIS — L8990 Pressure ulcer of unspecified site, unspecified stage: Secondary | ICD-10-CM | POA: Insufficient documentation

## 2016-02-27 DIAGNOSIS — L0291 Cutaneous abscess, unspecified: Secondary | ICD-10-CM | POA: Diagnosis present

## 2016-02-27 DIAGNOSIS — E860 Dehydration: Secondary | ICD-10-CM | POA: Diagnosis present

## 2016-02-27 DIAGNOSIS — N179 Acute kidney failure, unspecified: Secondary | ICD-10-CM

## 2016-02-27 DIAGNOSIS — B9562 Methicillin resistant Staphylococcus aureus infection as the cause of diseases classified elsewhere: Secondary | ICD-10-CM | POA: Diagnosis present

## 2016-02-27 DIAGNOSIS — E1122 Type 2 diabetes mellitus with diabetic chronic kidney disease: Secondary | ICD-10-CM | POA: Diagnosis present

## 2016-02-27 DIAGNOSIS — N183 Chronic kidney disease, stage 3 unspecified: Secondary | ICD-10-CM | POA: Diagnosis present

## 2016-02-27 DIAGNOSIS — E669 Obesity, unspecified: Secondary | ICD-10-CM | POA: Diagnosis present

## 2016-02-27 DIAGNOSIS — R29898 Other symptoms and signs involving the musculoskeletal system: Secondary | ICD-10-CM

## 2016-02-27 DIAGNOSIS — M549 Dorsalgia, unspecified: Secondary | ICD-10-CM | POA: Diagnosis present

## 2016-02-27 DIAGNOSIS — Z7952 Long term (current) use of systemic steroids: Secondary | ICD-10-CM

## 2016-02-27 DIAGNOSIS — R7881 Bacteremia: Secondary | ICD-10-CM | POA: Diagnosis present

## 2016-02-27 DIAGNOSIS — F17210 Nicotine dependence, cigarettes, uncomplicated: Secondary | ICD-10-CM | POA: Diagnosis present

## 2016-02-27 DIAGNOSIS — I10 Essential (primary) hypertension: Secondary | ICD-10-CM | POA: Diagnosis present

## 2016-02-27 DIAGNOSIS — E7800 Pure hypercholesterolemia, unspecified: Secondary | ICD-10-CM | POA: Diagnosis present

## 2016-02-27 DIAGNOSIS — Z79891 Long term (current) use of opiate analgesic: Secondary | ICD-10-CM | POA: Diagnosis not present

## 2016-02-27 DIAGNOSIS — Z6836 Body mass index (BMI) 36.0-36.9, adult: Secondary | ICD-10-CM

## 2016-02-27 DIAGNOSIS — R739 Hyperglycemia, unspecified: Secondary | ICD-10-CM | POA: Diagnosis not present

## 2016-02-27 LAB — CBG MONITORING, ED: Glucose-Capillary: >600 mg/dL (ref 65–99)

## 2016-02-27 LAB — CBC
HCT: 33.9 % — ABNORMAL LOW (ref 36.0–46.0)
HEMOGLOBIN: 10.8 g/dL — AB (ref 12.0–15.0)
MCH: 29.5 pg (ref 26.0–34.0)
MCHC: 31.9 g/dL (ref 30.0–36.0)
MCV: 92.6 fL (ref 78.0–100.0)
Platelets: 345 10*3/uL (ref 150–400)
RBC: 3.66 MIL/uL — ABNORMAL LOW (ref 3.87–5.11)
RDW: 16.1 % — ABNORMAL HIGH (ref 11.5–15.5)
WBC: 15.4 10*3/uL — ABNORMAL HIGH (ref 4.0–10.5)

## 2016-02-27 LAB — BASIC METABOLIC PANEL
ANION GAP: 7 (ref 5–15)
BUN: 49 mg/dL — ABNORMAL HIGH (ref 6–20)
CHLORIDE: 95 mmol/L — AB (ref 101–111)
CO2: 24 mmol/L (ref 22–32)
Calcium: 7.8 mg/dL — ABNORMAL LOW (ref 8.9–10.3)
Creatinine, Ser: 2.72 mg/dL — ABNORMAL HIGH (ref 0.44–1.00)
GFR calc Af Amer: 21 mL/min — ABNORMAL LOW (ref >60)
GFR, EST NON AFRICAN AMERICAN: 18 mL/min — AB (ref >60)
GLUCOSE: 863 mg/dL — AB (ref 65–99)
POTASSIUM: 3.9 mmol/L (ref 3.5–5.1)
Sodium: 126 mmol/L — ABNORMAL LOW (ref 135–145)

## 2016-02-27 LAB — URINE MICROSCOPIC-ADD ON

## 2016-02-27 LAB — COMPREHENSIVE METABOLIC PANEL
ALBUMIN: 1.9 g/dL — AB (ref 3.5–5.0)
ALT: 16 U/L (ref 14–54)
ANION GAP: 10 (ref 5–15)
AST: 15 U/L (ref 15–41)
Alkaline Phosphatase: 160 U/L — ABNORMAL HIGH (ref 38–126)
BILIRUBIN TOTAL: 0.6 mg/dL (ref 0.3–1.2)
BUN: 50 mg/dL — ABNORMAL HIGH (ref 6–20)
CO2: 26 mmol/L (ref 22–32)
Calcium: 8.4 mg/dL — ABNORMAL LOW (ref 8.9–10.3)
Chloride: 90 mmol/L — ABNORMAL LOW (ref 101–111)
Creatinine, Ser: 2.78 mg/dL — ABNORMAL HIGH (ref 0.44–1.00)
GFR calc Af Amer: 20 mL/min — ABNORMAL LOW (ref >60)
GFR, EST NON AFRICAN AMERICAN: 17 mL/min — AB (ref >60)
GLUCOSE: 967 mg/dL — AB (ref 65–99)
POTASSIUM: 4 mmol/L (ref 3.5–5.1)
Sodium: 126 mmol/L — ABNORMAL LOW (ref 135–145)
TOTAL PROTEIN: 6.9 g/dL (ref 6.5–8.1)

## 2016-02-27 LAB — DIFFERENTIAL
Basophils Absolute: 0 10*3/uL (ref 0.0–0.1)
Basophils Relative: 0 %
EOS ABS: 0 10*3/uL (ref 0.0–0.7)
EOS PCT: 0 %
LYMPHS ABS: 0.4 10*3/uL — AB (ref 0.7–4.0)
Lymphocytes Relative: 2 %
Monocytes Absolute: 0.8 10*3/uL (ref 0.1–1.0)
Monocytes Relative: 5 %
NEUTROS PCT: 93 %
Neutro Abs: 14.2 10*3/uL — ABNORMAL HIGH (ref 1.7–7.7)

## 2016-02-27 LAB — URINALYSIS, ROUTINE W REFLEX MICROSCOPIC
BILIRUBIN URINE: NEGATIVE
Glucose, UA: >1000 mg/dL — AB
KETONES UR: NEGATIVE mg/dL
LEUKOCYTES UA: NEGATIVE
NITRITE: NEGATIVE
PROTEIN: NEGATIVE mg/dL
Specific Gravity, Urine: <1.005 — ABNORMAL LOW (ref 1.005–1.030)
pH: 5 (ref 5.0–8.0)

## 2016-02-27 LAB — RAPID URINE DRUG SCREEN, HOSP PERFORMED
Amphetamines: NOT DETECTED
Barbiturates: NOT DETECTED
Benzodiazepines: NOT DETECTED
COCAINE: NOT DETECTED
OPIATES: NOT DETECTED
Tetrahydrocannabinol: NOT DETECTED

## 2016-02-27 LAB — I-STAT CHEM 8, ED
BUN: 42 mg/dL — ABNORMAL HIGH (ref 6–20)
CREATININE: 2.4 mg/dL — AB (ref 0.44–1.00)
Calcium, Ion: 1.2 mmol/L (ref 1.15–1.40)
Chloride: 90 mmol/L — ABNORMAL LOW (ref 101–111)
Glucose, Bld: >700 mg/dL (ref 65–99)
HEMATOCRIT: 38 % (ref 36.0–46.0)
HEMOGLOBIN: 12.9 g/dL (ref 12.0–15.0)
Potassium: 4.1 mmol/L (ref 3.5–5.1)
SODIUM: 129 mmol/L — AB (ref 135–145)
TCO2: 27 mmol/L (ref 0–100)

## 2016-02-27 LAB — GLUCOSE, CAPILLARY: Glucose-Capillary: >600 mg/dL (ref 65–99)

## 2016-02-27 LAB — PROTIME-INR
INR: 1.16
PROTHROMBIN TIME: 14.9 s (ref 11.4–15.2)

## 2016-02-27 LAB — I-STAT TROPONIN, ED: TROPONIN I, POC: 0 ng/mL (ref 0.00–0.08)

## 2016-02-27 LAB — APTT: aPTT: 35 seconds (ref 24–36)

## 2016-02-27 LAB — LACTIC ACID, PLASMA
LACTIC ACID, VENOUS: 2 mmol/L — AB (ref 0.5–1.9)
LACTIC ACID, VENOUS: 3.5 mmol/L — AB (ref 0.5–1.9)

## 2016-02-27 LAB — ETHANOL

## 2016-02-27 MED ORDER — SODIUM CHLORIDE 0.9 % IV SOLN: 1.0000 g | Freq: Once | INTRAVENOUS | Status: DC

## 2016-02-27 MED ORDER — SENNOSIDES-DOCUSATE SODIUM 8.6-50 MG PO TABS: 1.0000 | ORAL_TABLET | Freq: Every evening | ORAL | Status: DC | PRN

## 2016-02-27 MED ORDER — SODIUM CHLORIDE 0.9 % IV SOLN
INTRAVENOUS | Status: AC
  Filled 2016-02-27: qty 2.5

## 2016-02-27 MED ORDER — HEPARIN SODIUM (PORCINE) 5000 UNIT/ML IJ SOLN
5000.0000 [IU] | Freq: Three times a day (TID) | INTRAMUSCULAR | Status: DC
  Administered 2016-02-28 – 2016-03-05 (×19): 5000 [IU] via SUBCUTANEOUS
  Filled 2016-02-27 (×19): qty 1

## 2016-02-27 MED ORDER — PANTOPRAZOLE SODIUM 40 MG PO TBEC
40.0000 mg | DELAYED_RELEASE_TABLET | Freq: Every day | ORAL | Status: DC
  Administered 2016-02-28 – 2016-03-05 (×7): 40 mg via ORAL
  Filled 2016-02-27 (×7): qty 1

## 2016-02-27 MED ORDER — SODIUM CHLORIDE 0.9 % IV SOLN: INTRAVENOUS | Status: AC

## 2016-02-27 MED ORDER — ONDANSETRON HCL 4 MG/2ML IJ SOLN: 4.0000 mg | Freq: Four times a day (QID) | INTRAMUSCULAR | Status: DC | PRN

## 2016-02-27 MED ORDER — SODIUM CHLORIDE 0.9% FLUSH
3.0000 mL | Freq: Two times a day (BID) | INTRAVENOUS | Status: DC
  Administered 2016-02-27 – 2016-03-05 (×11): 3 mL via INTRAVENOUS

## 2016-02-27 MED ORDER — COLLAGENASE 250 UNIT/GM EX OINT
TOPICAL_OINTMENT | Freq: Every day | CUTANEOUS | Status: DC
  Administered 2016-02-28 – 2016-02-29 (×2): via TOPICAL
  Administered 2016-03-01: 1 via TOPICAL
  Administered 2016-03-02 – 2016-03-03 (×2): via TOPICAL
  Administered 2016-03-04: 1 via TOPICAL
  Administered 2016-03-05: 12:00:00 via TOPICAL
  Filled 2016-02-27: qty 30

## 2016-02-27 MED ORDER — ASPIRIN EC 325 MG PO TBEC
325.0000 mg | DELAYED_RELEASE_TABLET | Freq: Every day | ORAL | Status: DC
  Administered 2016-02-28 – 2016-03-05 (×7): 325 mg via ORAL
  Filled 2016-02-27 (×7): qty 1

## 2016-02-27 MED ORDER — SODIUM CHLORIDE 0.9 % IV SOLN
Freq: Once | INTRAVENOUS | Status: AC
  Administered 2016-02-27: 18:00:00 via INTRAVENOUS

## 2016-02-27 MED ORDER — METOPROLOL TARTRATE 25 MG PO TABS
25.0000 mg | ORAL_TABLET | Freq: Two times a day (BID) | ORAL | Status: DC
  Administered 2016-02-27 – 2016-03-05 (×14): 25 mg via ORAL
  Filled 2016-02-27 (×14): qty 1

## 2016-02-27 MED ORDER — SODIUM CHLORIDE 0.9 % IV BOLUS (SEPSIS)
1000.0000 mL | Freq: Once | INTRAVENOUS | Status: AC
  Administered 2016-02-27: 1000 mL via INTRAVENOUS

## 2016-02-27 MED ORDER — HYDROMORPHONE HCL 4 MG PO TABS
4.0000 mg | ORAL_TABLET | Freq: Four times a day (QID) | ORAL | Status: DC | PRN
  Administered 2016-02-29 – 2016-03-05 (×11): 4 mg via ORAL
  Filled 2016-02-27 (×11): qty 1

## 2016-02-27 MED ORDER — ONDANSETRON HCL 4 MG PO TABS: 4.0000 mg | ORAL_TABLET | Freq: Four times a day (QID) | ORAL | Status: DC | PRN

## 2016-02-27 MED ORDER — FLUOXETINE HCL 20 MG PO CAPS
20.0000 mg | ORAL_CAPSULE | Freq: Two times a day (BID) | ORAL | Status: DC
  Administered 2016-02-28 – 2016-03-05 (×13): 20 mg via ORAL
  Filled 2016-02-27 (×14): qty 1

## 2016-02-27 MED ORDER — VANCOMYCIN HCL 10 G IV SOLR
1500.0000 mg | Freq: Once | INTRAVENOUS | Status: AC
  Administered 2016-02-27: 1500 mg via INTRAVENOUS
  Filled 2016-02-27: qty 1500

## 2016-02-27 MED ORDER — SODIUM CHLORIDE 0.9 % IV SOLN
INTRAVENOUS | Status: DC
  Administered 2016-02-27: 5.4 [IU]/h via INTRAVENOUS
  Filled 2016-02-27: qty 2.5

## 2016-02-27 MED ORDER — ALBUTEROL SULFATE (2.5 MG/3ML) 0.083% IN NEBU: 3.0000 mL | INHALATION_SOLUTION | Freq: Four times a day (QID) | RESPIRATORY_TRACT | Status: DC | PRN

## 2016-02-27 MED ORDER — SODIUM CHLORIDE 0.9 % IV SOLN
1.0000 g | Freq: Two times a day (BID) | INTRAVENOUS | Status: DC
  Administered 2016-02-28 (×2): 1 g via INTRAVENOUS
  Filled 2016-02-27 (×4): qty 1

## 2016-02-27 MED ORDER — SODIUM CHLORIDE 0.9 % IV SOLN
INTRAVENOUS | Status: DC
  Administered 2016-02-27: 100 mL via INTRAVENOUS
  Administered 2016-02-28: 11:00:00 via INTRAVENOUS

## 2016-02-27 MED ORDER — PRAVASTATIN SODIUM 40 MG PO TABS
40.0000 mg | ORAL_TABLET | Freq: Every day | ORAL | Status: DC
  Administered 2016-02-27 – 2016-03-04 (×7): 40 mg via ORAL
  Filled 2016-02-27 (×7): qty 1

## 2016-02-27 NOTE — ED Notes (Signed)
CRITICAL VALUE ALERT  Critical value received:  Lactic acid = 2.0  Date of notification:  02/27/16  Time of notification:  1810  Critical value read back:Yes.    Nurse who received alert:  Antony OdeaJason Geniva Lohnes  MD notified (1st page):  Rancour  Time of first page:  1811  MD notified (2nd page):  Time of second page:  Responding MD:  Rancour  Time MD responded:  [phone]

## 2016-02-27 NOTE — ED Notes (Addendum)
CRITICAL VALUE ALERT  Critical value received:  Glucose = 967  Date of notification:  02/27/16  Time of notification:  1800  Critical value read back:Yes.    Nurse who received alert:  Antony OdeaJason Ahnaf Caponi  MD notified (1st page):  Rancour  Time of first page:  1805  MD notified (2nd page):  Time of second page:  Responding MD:  Rancour  Time MD responded:

## 2016-02-27 NOTE — ED Notes (Signed)
CRITICAL VALUE ALERT  Critical value received:  Lactic acid 3.5  Date of notification:  02/27/2016  Time of notification:  2115  Critical value read back:Yes.    Nurse who received alert:  Neldon Mcina Login Muckleroy RN  MD notified (1st page):  Dr. Manus Gunningancour  Time of first page:  2116  MD notified (2nd page):  Time of second page:  Responding MD:    Time MD responded:

## 2016-02-27 NOTE — ED Triage Notes (Signed)
Pt returns with painful multiple abscesses to back and neck.  Pt also reports spasms to left hand and arm x 2 days.

## 2016-02-27 NOTE — H&P (Signed)
History and Physical    DIONA PEREGOY ITG:[id number] DOB: 18-Feb-1956 DOA: 02/27/2016  Referring MD/NP/PA: Ezequiel Essex, MD PCP: Deloria Lair, MD  Outpatient Specialists: None Patient coming from: Home  Chief Complaint: Back and chest abscesses  HPI: [name] is a 60 y.o. female with medical history significant of HTN, HLD, and CKD stage 3 presents to the ED with progressively worsened back and chest abscesses that have been occurring for the past several weeks. She admits to pain to her back and shoulders at the location of these abscesses but denies any CP. She currently lives at home with her brother and boyfriend.  She has had recurrent abscess, and has been in and out of the hospital multiple times.  She has hx of CKD, and had been on dialysis previously.  She had seen Dr Chilton Greathouse in the past.   ED Course: While in the Ed, afebrile, hypertensive, CBG is >600, sodium 129, chloride 90, BUN 42, Cr. 2.40. CT a/p shows Indistinct renal margins which could go along with nephritis, but no evidence of abscess or obstruction. CT chest showed Small effusions left more than right. Areas of pulmonary atelectasis left more than right. Possibility exists that there could be some patchy pneumonia, particularly in the left lower lobe. CT head shows Multiple areas of density in the scalp consistent with foci of soft tissue inflammation. She will be admitted for further evaluation of her abscesses, for AKI on CKD, and for hyperosmolar syndrome.   Review of Systems: As per HPI otherwise 10 point review of systems negative.    Past Medical History:  Diagnosis Date  . Anxiety   . Depression   . GERD (gastroesophageal reflux disease)   . High cholesterol   . Hypertension   . Type II diabetes mellitus ([place])     Past Surgical History:  Procedure Laterality Date  . APPLICATION OF WOUND VAC Right 10/25/2015   Procedure: APPLICATION OF WOUND VAC;  Surgeon: Aviva Signs, MD;   Location: [facility];  Service: General;  Laterality: Right;  . CESAREAN SECTION  1984  . INCISION AND DRAINAGE Right 08/09/2015   Procedure: INCISION AND DRAINAGE;  Surgeon: Arta Bruce Kinsinger, MD;  Location: [facility];  Service: General;  Laterality: Right;  . INCISION AND DRAINAGE ABSCESS Right 08/08/2015   Procedure: Irrigation and Debridement of Right Lower Ext.;  Surgeon: Ralene Ok, MD;  Location: [facility];  Service: General;  Laterality: Right;  . INCISION AND DRAINAGE ABSCESS Right 10/25/2015   Procedure: INCISION AND DRAINAGE ABSCESS RIGHT AXILLARY ABSCESS;  Surgeon: Aviva Signs, MD;  Location: [facility];  Service: General;  Laterality: Right;     reports that she has been smoking Cigarettes.  She has a 3.50 pack-year smoking history. She has never used smokeless tobacco. She reports that she drinks about 0.6 oz of alcohol per week . She reports that she does not use drugs.  Allergies  Allergen Reactions  . Other     Per pt some type of mycin "some antibiotic sent me into renal failure."  . Penicillins Rash    Tolerated Zosyn in 07/2015 Has patient had a PCN reaction causing immediate rash, facial/tongue/throat swelling, SOB or lightheadedness with hypotension: No Has patient had a PCN reaction causing severe rash involving mucus membranes or skin necrosis: No Has patient had a PCN reaction that required hospitalization No Has patient had a PCN reaction occurring within the last 10 years: No If all of the above answers are "NO", then may  proceed with Cephalosporin use.     History reviewed. No pertinent family history. Unacceptable: Noncontributory, unremarkable, or negative. Acceptable: Family history reviewed and not pertinent (If you reviewed it)  Prior to Admission medications   Medication Sig Start Date End Date Taking? Authorizing Provider  albuterol (PROAIR HFA) 108 (90 Base) MCG/ACT inhaler Inhale 2 puffs into the lungs every 6 (six) hours as needed for wheezing or shortness of  breath.   Yes Historical Provider, MD  aspirin EC 325 MG tablet Take 325 mg by mouth daily.   Yes Historical Provider, MD  clobetasol cream (TEMOVATE) 0.05 % Apply topically 2 (two) times daily. 09/21/15  Yes Thurnell Lose, MD  collagenase (SANTYL) ointment Apply in a nickel thickness to left achilles wound in the wound bed only, being careful not to get on surrounding skin.  Cover with dry gauze. 02/03/16  Yes Riccardo Dubin, MD  FLUoxetine (PROZAC) 20 MG capsule Take 20 mg by mouth 2 (two) times daily.  05/11/15  Yes Historical Provider, MD  HYDROmorphone (DILAUDID) 4 MG tablet Take 4 mg by mouth 2 (two) times daily as needed. For pain 11/09/15  Yes Historical Provider, MD  insulin aspart (NOVOLOG FLEXPEN) 100 UNIT/ML FlexPen Inject 3 Units into the skin 3 (three) times daily with meals.   Yes Historical Provider, MD  insulin glargine (LANTUS) 100 UNIT/ML injection Inject 0.18 mLs (18 Units total) into the skin 2 (two) times daily. Patient taking differently: Inject 18 Units into the skin at bedtime.  08/15/15  Yes Thurnell Lose, MD  lisinopril (PRINIVIL,ZESTRIL) 20 MG tablet Take 1 tablet (20 mg total) by mouth daily. 02/03/16  Yes Riccardo Dubin, MD  metoprolol tartrate (LOPRESSOR) 25 MG tablet Take 1 tablet (25 mg total) by mouth 2 (two) times daily. 09/21/15  Yes Thurnell Lose, MD  Multiple Vitamin (MULTIVITAMIN WITH MINERALS) TABS tablet Take 1 tablet by mouth daily. 10/26/15  Yes Estela Leonie Green, MD  pantoprazole (PROTONIX) 40 MG tablet Take 1 tablet (40 mg total) by mouth daily. 02/03/16  Yes Riccardo Dubin, MD  pravastatin (PRAVACHOL) 40 MG tablet Take 40 mg by mouth at bedtime.  05/11/15  Yes Historical Provider, MD  QUEtiapine (SEROQUEL) 25 MG tablet Take 1 tablet by mouth at bedtime.  12/22/15  Yes Historical Provider, MD  ciprofloxacin (CIPRO) 500 MG tablet Take 1 tablet (500 mg total) by mouth 2 (two) times daily. Patient not taking: Reported on 02/27/2016 02/18/16   Erline Hau, MD  predniSONE (DELTASONE) 20 MG tablet Take 40 mg by mouth daily with breakfast.    Historical Provider, MD    Physical Exam: Vitals:   02/27/16 1615 02/27/16 1715 02/27/16 1805 02/27/16 1922  BP:   (!) 159/128 160/96  Pulse:  75 81 86  Resp:  11 18 18   Temp: 98 F (36.7 C)  97.9 F (36.6 C)   TempSrc:   Oral   SpO2:  95% 91% 96%  Weight:      Height:          Constitutional: NAD, calm, comfortable Vitals:   02/27/16 1615 02/27/16 1715 02/27/16 1805 02/27/16 1922  BP:   (!) 159/128 160/96  Pulse:  75 81 86  Resp:  11 18 18   Temp: 98 F (36.7 C)  97.9 F (36.6 C)   TempSrc:   Oral   SpO2:  95% 91% 96%  Weight:      Height:  Eyes: PERRL, lids and conjunctivae normal ENMT: Mucous membranes are moist. Posterior pharynx clear of any exudate or lesions.Normal dentition.  Neck: normal, supple, no masses, no thyromegaly Respiratory: clear to auscultation bilaterally, no wheezing, no crackles. Normal respiratory effort. No accessory muscle use.  Cardiovascular: Regular rate and rhythm, no murmurs / rubs / gallops. No extremity edema. 2+ pedal pulses. No carotid bruits.  Abdomen: no tenderness, no masses palpated. No hepatosplenomegaly. Bowel sounds positive.  Musculoskeletal: no clubbing / cyanosis. No joint deformity upper and lower extremities. Good ROM, no contractures. Normal muscle tone.  Skin: she has multiple pustules on her back and right shoulder.  Neurologic: CN 2-12 grossly intact. Sensation intact, DTR normal. Strength 5/5 in all 4.  Psychiatric: Normal judgment and insight. Alert and oriented x 3. Normal mood.    Labs on Admission: I have personally reviewed following labs and imaging studies  CBC:  Recent Labs Lab 02/27/16 1650 02/27/16 1721  WBC 15.4*  --   NEUTROABS 14.2*  --   HGB 10.8* 12.9  HCT 33.9* 38.0  MCV 92.6  --   PLT 345  --    Basic Metabolic Panel:  Recent Labs Lab 02/27/16 1650 02/27/16 1721  NA 126* 129*   K 4.0 4.1  CL 90* 90*  CO2 26  --   GLUCOSE 967* >700*  BUN 50* 42*  CREATININE 2.78* 2.40*  CALCIUM 8.4*  --    GFR: Estimated Creatinine Clearance: 26.6 mL/min (by C-G formula based on SCr of 2.4 mg/dL (H)). Liver Function Tests:  Recent Labs Lab 02/27/16 1650  AST 15  ALT 16  ALKPHOS 160*  BILITOT 0.6  PROT 6.9  ALBUMIN 1.9*   Coagulation Profile:  Recent Labs Lab 02/27/16 1650  INR 1.16   CBG:  Recent Labs Lab 02/27/16 1933  GLUCAP >600*   Urine analysis:    Component Value Date/Time   COLORURINE YELLOW 02/27/2016 1555   APPEARANCEUR HAZY (A) 02/27/2016 1555   LABSPEC <1.005 (L) 02/27/2016 1555   PHURINE 5.0 02/27/2016 1555   GLUCOSEU >1000 (A) 02/27/2016 1555   HGBUR SMALL (A) 02/27/2016 1555   BILIRUBINUR NEGATIVE 02/27/2016 1555   KETONESUR NEGATIVE 02/27/2016 1555   PROTEINUR NEGATIVE 02/27/2016 1555   NITRITE NEGATIVE 02/27/2016 1555   LEUKOCYTESUR NEGATIVE 02/27/2016 1555    Recent Results (from the past 240 hour(s))  Urine culture     Status: Abnormal   Collection Time: 02/17/16 11:35 PM  Result Value Ref Range Status   Specimen Description URINE, RANDOM  Final   Special Requests NONE  Final   Culture >=100,000 COLONIES/mL KLEBSIELLA PNEUMONIAE (A)  Final   Report Status 02/21/2016 FINAL  Final   Organism ID, Bacteria KLEBSIELLA PNEUMONIAE (A)  Final      Susceptibility   Klebsiella pneumoniae - MIC*    AMPICILLIN >=32 RESISTANT Resistant     CEFAZOLIN <=4 SENSITIVE Sensitive     CEFTRIAXONE <=1 SENSITIVE Sensitive     CIPROFLOXACIN <=0.25 SENSITIVE Sensitive     GENTAMICIN <=1 SENSITIVE Sensitive     IMIPENEM <=0.25 SENSITIVE Sensitive     NITROFURANTOIN <=16 SENSITIVE Sensitive     TRIMETH/SULFA <=20 SENSITIVE Sensitive     AMPICILLIN/SULBACTAM 4 SENSITIVE Sensitive     PIP/TAZO <=4 SENSITIVE Sensitive     Extended ESBL NEGATIVE Sensitive     * >=100,000 COLONIES/mL KLEBSIELLA PNEUMONIAE     Radiological Exams on  Admission: Ct Abdomen Pelvis Wo Contrast  Result Date: 02/27/2016 CLINICAL DATA:  Multiple painful  abscesses throughout the body. EXAM: CT ABDOMEN AND PELVIS WITHOUT CONTRAST TECHNIQUE: Multidetector CT imaging of the abdomen and pelvis was performed following the standard protocol without IV contrast. COMPARISON:  02/17/2016 FINDINGS: Hepatobiliary: Liver appears unremarkable without contrast. Pancreas: Negative Spleen: Normal Adrenals/Urinary Tract: Adrenal glands are normal. Renal margins appear slightly indistinct which could go along with nephritis. No evidence of mass, abscess or obstruction. Stomach/Bowel: No primary bowel pathology. Ventral hernia which contains fat and a segment of transverse colon. Vascular/Lymphatic: Aortic atherosclerosis. No aneurysm. IVC is normal. No retroperitoneal mass or lymphadenopathy. Reproductive: Uterus and adnexal regions appear unremarkable. Other: No ascites or free air. Musculoskeletal: Ordinary degenerative changes affect the lumbar spine. There are areas of edema within the subcutaneous fat of the abdomen which could be nonspecific edema or areas of inflammation. IMPRESSION: Indistinct renal margins which could go along with nephritis. No evidence of abscess or obstruction. Ventral hernia containing fat and a segment of transverse colon. No obstruction. Edema pattern within the subcutaneous tissues which could be due to simple edema or inflammatory edema. Electronically Signed   By: Nelson Chimes M.D.   On: 02/27/2016 19:26   Ct Head Wo Contrast  Result Date: 02/27/2016 CLINICAL DATA:  Painful abscesses of the back and neck. Elevated creatinine. EXAM: CT HEAD WITHOUT CONTRAST TECHNIQUE: Contiguous axial images were obtained from the base of the skull through the vertex without intravenous contrast. COMPARISON:  None. FINDINGS: Brain: Normal appearance of the brain without evidence of old or acute infarction, mass lesion, hemorrhage, hydrocephalus or extra-axial  collection. Vascular: There is atherosclerotic calcification of the major vessels at the base of the brain. Skull: Normal Sinuses/Orbits: Normal Other: Few areas of swelling of the scalp which could be infectious inflammation. No large or obviously drainable scalp collection. IMPRESSION: Multiple areas of density in the scalp consistent with foci of soft tissue inflammation. None of these appear large or definitely low-density to suggest drainable collection. No intracranial abnormality. Electronically Signed   By: Nelson Chimes M.D.   On: 02/27/2016 19:17   Ct Chest Wo Contrast  Result Date: 02/27/2016 CLINICAL DATA:  Multiple painful abscesses of the back and neck. Pneumonia on the previous chest radiograph. EXAM: CT CHEST WITHOUT CONTRAST TECHNIQUE: Multidetector CT imaging of the chest was performed following the standard protocol without IV contrast. COMPARISON:  Chest radiography 02/18/2016. FINDINGS: Cardiovascular: Coronary artery calcification. Aortic atherosclerosis without evidence of aneurysm. Mediastinum/Nodes: No mediastinal mass or lymphadenopathy discernible without contrast. Lungs/Pleura: Bilateral pleural effusions layering dependently Coup left more than right. Mild dependent atelectasis in the right lung. Some patchy atelectasis or scarring anteriorly in the right upper lobe. More considerable density in the left lower lobe that could be a combination of volume loss and left lower lobe pneumonia. Upper Abdomen: See results of abdominal scan. Musculoskeletal: Thoracic spine is negative. There are multiple areas of abnormal soft tissue density within the subcutaneous fat of the thorax consistent with soft tissue infection as described clinically. Largest area is in the left lower lateral chest. IMPRESSION: Small effusions left more than right. Areas of pulmonary atelectasis left more than right. Possibility exists that there could be some patchy pneumonia, particularly in the left lower lobe.  Aortic atherosclerosis.  Coronary artery atherosclerosis. Multiple areas of soft tissue density within the subcutaneous fat of the chest consistent with multiple superficial soft tissue infections as described clinically. Electronically Signed   By: Nelson Chimes M.D.   On: 02/27/2016 19:21    Assessment/Plan Principal Problem:   Abscess  of skin and subcutaneous tissue Active Problems:   Hypertension   HLD (hyperlipidemia)   CKD (chronic kidney disease), stage III   Paroxysmal SVT (supraventricular tachycardia) (HCC)   Hyperglycemia   Hyperosmolarity syndrome   1. Abscess of skin and subcutaneous tissue. Lactic acid 2.0. Check immunoglobulin levels to make sure she doesn't have combined immunoglobulin deficiency syndomre.  IF positive, she can benefit getting IVIG monthly. Follow up blood cultures. Start on IV vancomycin and IV invanz (Not sure about PCN allergy as notes indicate she tolerated Zosyn in the past.  Will use Invantz for now). Continue to monitor. 2. Hyperosmolarity syndrome. Likely due to infection. Pt is hyperglycemic. Glucose >700. Start on SSI. Start on IV fluids. Monitor glucose levels closely.  Will check basic met profile every 8 hours.  Admit to ICU for insulin drip  3. Hyponatremia. Low at 129. Start on sodium supplementation. Recheck BMP in the am. 4. DM type 2. CBG >600. Start on SSI. 5. Paroxymal SVT. Noted 6. HLD. Continue on statins. 7. CKD stage 3. Both Cr and BUN are elevated. Monitor input and output. Will hold her ACE I.  If no improvement, consider renal consultation.  8. HTN. BP elevated. Start on antihypertensives.    DVT prophylaxis: Heparin.  Code Status: Full Family Communication: No family at bedside  Disposition Plan: Discharge once improved Consults called: None Admission status: Admit to inpatient   Orvan Falconer, MD FACP [facility] If 7PM-7AM, please contact night-coverage [email or web address] Password [email or web address] 02/27/2016, 7:40 PM   By signing  my name below, I, Hilbert Odor, attest that this documentation has been prepared under the direction and in the presence of Orvan Falconer, MD. Electronically signed: Hilbert Odor, Scribe.  02/27/16, 8:00 PM

## 2016-02-27 NOTE — ED Provider Notes (Addendum)
[facility] DEPT Provider Note   CSN: [id number] Arrival date & time: 02/27/16  1505     History   Chief Complaint Chief Complaint  Patient presents with  . Back Pain    Abcess    HPI [name] is a 60 y.o. female.  Patient from home with multiple abscesses to her back and chest wall. States they have been going on for several weeks and progressively worsening. She has a history of necrotizing fasciitis in her right groin earlier this year. She isn't diabetic and states her sugars have been well-controlled. Denies fever. Denies vomiting. Denies any chest pain or shortness of breath. She complains of painful sores to her right shoulder, thoracic and lumbar spine, left chest wall that had been draining she was seen for a rash in the ED several weeks ago but was not having these draining lesions at the time.   The history is provided by the patient, the EMS personnel and a relative.  Back Pain   Pertinent negatives include no chest pain, no fever, no headaches, no abdominal pain and no dysuria.    Past Medical History:  Diagnosis Date  . Anxiety   . Depression   . GERD (gastroesophageal reflux disease)   . High cholesterol   . Hypertension   . Type II diabetes mellitus [facility])     Patient Active Problem List   Diagnosis Date Noted  . h/o dialysis needs spring 2017 02/18/2016  . Paroxysmal SVT (supraventricular tachycardia) (HCC) 02/18/2016  . Emesis 02/18/2016  . Hypokalemia 02/17/2016  . Volume overload 02/02/2016  . Abdominal pain 02/01/2016  . CKD (chronic kidney disease), stage III 10/24/2015  . Abscess of axilla, right 10/23/2015  . HLD (hyperlipidemia) 10/12/2015  . Anemia of chronic disease 10/12/2015  . Depression 10/12/2015  . UTI (lower urinary tract infection) 09/12/2015  . Generalized exfoliative dermatitis 09/09/2015  . Normocytic anemia 09/09/2015  . Necrotizing fasciitis (HCC) of inner thigh 08/07/2015  . IDDM (insulin dependent diabetes  mellitus) (HCC) 08/07/2015  . History of hypertension 08/07/2015  . Hypertension 08/07/2015    Past Surgical History:  Procedure Laterality Date  . APPLICATION OF WOUND VAC Right 10/25/2015   Procedure: APPLICATION OF WOUND VAC;  Surgeon: Franky Macho, MD;  Location: [facility] ORS;  Service: General;  Laterality: Right;  . CESAREAN SECTION  1984  . INCISION AND DRAINAGE Right 08/09/2015   Procedure: INCISION AND DRAINAGE;  Surgeon: De Blanch Kinsinger, MD;  Location[facility] OR;  Service: General;  Laterality: Right;  . INCISION AND DRAINAGE ABSCESS Right 08/08/2015   Procedure: Irrigation and Debridement of Right Lower Ext.;  Surgeon: Axel Filler, MD;  Location: [facility] OR;  Service: General;  Laterality: Right;  . INCISION AND DRAINAGE ABSCESS Right 10/25/2015   Procedure: INCISION AND DRAINAGE ABSCESS RIGHT AXILLARY ABSCESS;  Surgeon: Franky Macho, MD;  Location: [facility];  Service: General;  Laterality: Right;    OB History    No data available       Home Medications    Prior to Admission medications   Medication Sig Start Date End Date Taking? Authorizing Provider  albuterol (PROAIR HFA) 108 (90 Base) MCG/ACT inhaler Inhale 2 puffs into the lungs every 6 (six) hours as needed for wheezing or shortness of breath.    Historical Provider, MD  aspirin EC 325 MG tablet Take 325 mg by mouth daily.    Historical Provider, MD  ciprofloxacin (CIPRO) 500 MG tablet Take 1 tablet (500 mg total) by mouth 2 (  two) times daily. 02/18/16   Henderson Cloud, MD  clobetasol cream (TEMOVATE) 0.05 % Apply topically 2 (two) times daily. 09/21/15   Leroy Sea, MD  collagenase (SANTYL) ointment Apply in a nickel thickness to left achilles wound in the wound bed only, being careful not to get on surrounding skin.  Cover with dry gauze. 02/03/16   Beather Arbour, MD  FLUoxetine (PROZAC) 20 MG capsule Take 20 mg by mouth 2 (two) times daily.  05/11/15   Historical Provider, MD  HYDROmorphone (DILAUDID) 4 MG  tablet Take 4 mg by mouth 2 (two) times daily as needed. For pain 11/09/15   Historical Provider, MD  insulin aspart (NOVOLOG FLEXPEN) 100 UNIT/ML FlexPen Inject 3 Units into the skin 3 (three) times daily with meals.    Historical Provider, MD  insulin glargine (LANTUS) 100 UNIT/ML injection Inject 0.18 mLs (18 Units total) into the skin 2 (two) times daily. Patient taking differently: Inject 18 Units into the skin at bedtime.  08/15/15   Leroy Sea, MD  lisinopril (PRINIVIL,ZESTRIL) 20 MG tablet Take 1 tablet (20 mg total) by mouth daily. 02/03/16   Beather Arbour, MD  metoprolol tartrate (LOPRESSOR) 25 MG tablet Take 1 tablet (25 mg total) by mouth 2 (two) times daily. 09/21/15   Leroy Sea, MD  Multiple Vitamin (MULTIVITAMIN WITH MINERALS) TABS tablet Take 1 tablet by mouth daily. 10/26/15   Henderson Cloud, MD  pantoprazole (PROTONIX) 40 MG tablet Take 1 tablet (40 mg total) by mouth daily. 02/03/16   Beather Arbour, MD  pravastatin (PRAVACHOL) 40 MG tablet Take 40 mg by mouth at bedtime.  05/11/15   Historical Provider, MD  predniSONE (DELTASONE) 20 MG tablet Take 40 mg by mouth daily with breakfast.    Historical Provider, MD    Family History History reviewed. No pertinent family history.  Social History Social History  Substance Use Topics  . Smoking status: Current Every Day Smoker    Packs/day: 0.10    Years: 35.00    Types: Cigarettes  . Smokeless tobacco: Never Used  . Alcohol use 0.6 oz/week    1 Glasses of wine per week     Allergies   Other and Penicillins   Review of Systems Review of Systems  Constitutional: Positive for activity change and appetite change. Negative for chills, diaphoresis and fever.  HENT: Negative for congestion.   Respiratory: Negative for cough, chest tightness and shortness of breath.   Cardiovascular: Negative for chest pain.  Gastrointestinal: Negative for abdominal pain, nausea and vomiting.  Genitourinary: Negative for  dysuria and hematuria.  Musculoskeletal: Positive for back pain.  Skin: Positive for wound.  Neurological: Negative for dizziness, light-headedness and headaches.  A complete 10 system review of systems was obtained and all systems are negative except as noted in the HPI and PMH.     Physical Exam Updated Vital Signs BP 156/79 (BP Location: Right Arm)   Pulse 78   Temp 98 F (36.7 C) (Oral)   Resp 18   Ht 5\' 3"  (1.6 m)   Wt 200 lb (90.7 kg)   SpO2 92%   BMI 35.43 kg/m   Physical Exam  Constitutional: She is oriented to person, place, and time. She appears well-developed and well-nourished. No distress.  HENT:  Head: Normocephalic and atraumatic.  Mouth/Throat: Oropharynx is clear and moist. No oropharyngeal exudate.  Eyes: Conjunctivae and EOM are normal. Pupils are equal, round, and reactive to light.  Neck: Normal range of motion. Neck supple.  No meningismus.  Cardiovascular: Normal rate, regular rhythm, normal heart sounds and intact distal pulses.   No murmur heard. Pulmonary/Chest: Effort normal and breath sounds normal. No respiratory distress.  Abdominal: Soft. There is no tenderness. There is no rebound and no guarding.  Musculoskeletal: Normal range of motion. She exhibits no edema or tenderness.  Neurological: She is alert and oriented to person, place, and time. No cranial nerve deficit. She exhibits normal muscle tone. Coordination normal.  No ataxia on finger to nose bilaterally. No pronator drift. L arm 4/5 strength compare, 5/5 strength on RUE.  +Pronator drift. CN 2-12 intact. Decreased grip strength on left.  Sensation intact.   Skin: Skin is warm. Rash noted.  Patient with multiple areas of induration along her thoracic or lumbar spine. Some are actively draining purulence. There is large area of induration to her left thoracic paraspinal muscles. There is induration to her right shoulder And left chest wall underneath her left breast with excoriation as  depicted. Multiple pustules all along thoracic or lumbar spine that have active extravasation of pus when palpated  Psychiatric: She has a normal mood and affect. Her behavior is normal.  Nursing note and vitals reviewed.            ED Treatments / Results  Labs (all labs ordered are listed, but only abnormal results are displayed) Labs Reviewed  MRSA PCR SCREENING - Abnormal; Notable for the following:       Result Value   MRSA by PCR POSITIVE (*)    All other components within normal limits  BLOOD CULTURE ID PANEL (REFLEXED) - Abnormal; Notable for the following:    Staphylococcus species DETECTED (*)    Staphylococcus aureus DETECTED (*)    Methicillin resistance DETECTED (*)    All other components within normal limits  CBC - Abnormal; Notable for the following:    WBC 15.4 (*)    RBC 3.66 (*)    Hemoglobin 10.8 (*)    HCT 33.9 (*)    RDW 16.1 (*)    All other components within normal limits  DIFFERENTIAL - Abnormal; Notable for the following:    Neutro Abs 14.2 (*)    Lymphs Abs 0.4 (*)    All other components within normal limits  COMPREHENSIVE METABOLIC PANEL - Abnormal; Notable for the following:    Sodium 126 (*)    Chloride 90 (*)    Glucose, Bld 967 (*)    BUN 50 (*)    Creatinine, Ser 2.78 (*)    Calcium 8.4 (*)    Albumin 1.9 (*)    Alkaline Phosphatase 160 (*)    GFR calc non Af Amer 17 (*)    GFR calc Af Amer 20 (*)    All other components within normal limits  URINALYSIS, ROUTINE W REFLEX MICROSCOPIC (NOT AT [facility]) - Abnormal; Notable for the following:    APPearance HAZY (*)    Specific Gravity, Urine <1.005 (*)    Glucose, UA >1000 (*)    Hgb urine dipstick SMALL (*)    All other components within normal limits  LACTIC ACID, PLASMA - Abnormal; Notable for the following:    Lactic Acid, Venous 2.0 (*)    All other components within normal limits  LACTIC ACID, PLASMA - Abnormal; Notable for the following:    Lactic Acid, Venous 3.5 (*)     All other components within normal limits  URINE MICROSCOPIC-ADD ON -  Abnormal; Notable for the following:    Squamous Epithelial / LPF 0-5 (*)    Bacteria, UA MANY (*)    All other components within normal limits  GLUCOSE, CAPILLARY - Abnormal; Notable for the following:    Glucose-Capillary >600 (*)    All other components within normal limits  BASIC METABOLIC PANEL - Abnormal; Notable for the following:    Sodium 126 (*)    Chloride 95 (*)    Glucose, Bld 863 (*)    BUN 49 (*)    Creatinine, Ser 2.72 (*)    Calcium 7.8 (*)    GFR calc non Af Amer 18 (*)    GFR calc Af Amer 21 (*)    All other components within normal limits  BASIC METABOLIC PANEL - Abnormal; Notable for the following:    Sodium 134 (*)    Potassium 3.0 (*)    Glucose, Bld 298 (*)    BUN 44 (*)    Creatinine, Ser 2.16 (*)    Calcium 8.1 (*)    GFR calc non Af Amer 24 (*)    GFR calc Af Amer 27 (*)    All other components within normal limits  GLUCOSE, CAPILLARY - Abnormal; Notable for the following:    Glucose-Capillary >600 (*)    All other components within normal limits  GLUCOSE, CAPILLARY - Abnormal; Notable for the following:    Glucose-Capillary 598 (*)    All other components within normal limits  GLUCOSE, CAPILLARY - Abnormal; Notable for the following:    Glucose-Capillary >600 (*)    All other components within normal limits  GLUCOSE, CAPILLARY - Abnormal; Notable for the following:    Glucose-Capillary >600 (*)    All other components within normal limits  BASIC METABOLIC PANEL - Abnormal; Notable for the following:    Sodium 132 (*)    Glucose, Bld 520 (*)    BUN 45 (*)    Creatinine, Ser 2.33 (*)    Calcium 7.9 (*)    GFR calc non Af Amer 22 (*)    GFR calc Af Amer 25 (*)    All other components within normal limits  CBC - Abnormal; Notable for the following:    WBC 15.1 (*)    RBC 3.50 (*)    Hemoglobin 10.3 (*)    HCT 31.0 (*)    All other components within normal limits   GLUCOSE, CAPILLARY - Abnormal; Notable for the following:    Glucose-Capillary 500 (*)    All other components within normal limits  GLUCOSE, CAPILLARY - Abnormal; Notable for the following:    Glucose-Capillary 423 (*)    All other components within normal limits  GLUCOSE, CAPILLARY - Abnormal; Notable for the following:    Glucose-Capillary 362 (*)    All other components within normal limits  GLUCOSE, CAPILLARY - Abnormal; Notable for the following:    Glucose-Capillary 362 (*)    All other components within normal limits  GLUCOSE, CAPILLARY - Abnormal; Notable for the following:    Glucose-Capillary 273 (*)    All other components within normal limits  GLUCOSE, CAPILLARY - Abnormal; Notable for the following:    Glucose-Capillary 244 (*)    All other components within normal limits  GLUCOSE, CAPILLARY - Abnormal; Notable for the following:    Glucose-Capillary 224 (*)    All other components within normal limits  GLUCOSE, CAPILLARY - Abnormal; Notable for the following:    Glucose-Capillary 178 (*)  All other components within normal limits  GLUCOSE, CAPILLARY - Abnormal; Notable for the following:    Glucose-Capillary 105 (*)    All other components within normal limits  I-STAT CHEM 8, ED - Abnormal; Notable for the following:    Sodium 129 (*)    Chloride 90 (*)    BUN 42 (*)    Creatinine, Ser 2.40 (*)    Glucose, Bld >700 (*)    All other components within normal limits  CBG MONITORING, ED - Abnormal; Notable for the following:    Glucose-Capillary >600 (*)    All other components within normal limits  CBG MONITORING, ED - Abnormal; Notable for the following:    Glucose-Capillary >600 (*)    All other components within normal limits  CULTURE, BLOOD (ROUTINE X 2)  CULTURE, BLOOD (ROUTINE X 2)  ETHANOL  PROTIME-INR  APTT  URINE RAPID DRUG SCREEN, HOSP PERFORMED  GLUCOSE, CAPILLARY  HEMOGLOBIN A1C  IGG  BASIC METABOLIC PANEL  BASIC METABOLIC PANEL  I-STAT  TROPOININ, ED    EKG  EKG Interpretation None       Radiology Ct Abdomen Pelvis Wo Contrast  Result Date: 02/27/2016 CLINICAL DATA:  Multiple painful abscesses throughout the body. EXAM: CT ABDOMEN AND PELVIS WITHOUT CONTRAST TECHNIQUE: Multidetector CT imaging of the abdomen and pelvis was performed following the standard protocol without IV contrast. COMPARISON:  02/17/2016 FINDINGS: Hepatobiliary: Liver appears unremarkable without contrast. Pancreas: Negative Spleen: Normal Adrenals/Urinary Tract: Adrenal glands are normal. Renal margins appear slightly indistinct which could go along with nephritis. No evidence of mass, abscess or obstruction. Stomach/Bowel: No primary bowel pathology. Ventral hernia which contains fat and a segment of transverse colon. Vascular/Lymphatic: Aortic atherosclerosis. No aneurysm. IVC is normal. No retroperitoneal mass or lymphadenopathy. Reproductive: Uterus and adnexal regions appear unremarkable. Other: No ascites or free air. Musculoskeletal: Ordinary degenerative changes affect the lumbar spine. There are areas of edema within the subcutaneous fat of the abdomen which could be nonspecific edema or areas of inflammation. IMPRESSION: Indistinct renal margins which could go along with nephritis. No evidence of abscess or obstruction. Ventral hernia containing fat and a segment of transverse colon. No obstruction. Edema pattern within the subcutaneous tissues which could be due to simple edema or inflammatory edema. Electronically Signed   By: Paulina Fusi M.D.   On: 02/27/2016 19:26   Ct Head Wo Contrast  Result Date: 02/27/2016 CLINICAL DATA:  Painful abscesses of the back and neck. Elevated creatinine. EXAM: CT HEAD WITHOUT CONTRAST TECHNIQUE: Contiguous axial images were obtained from the base of the skull through the vertex without intravenous contrast. COMPARISON:  None. FINDINGS: Brain: Normal appearance of the brain without evidence of old or acute  infarction, mass lesion, hemorrhage, hydrocephalus or extra-axial collection. Vascular: There is atherosclerotic calcification of the major vessels at the base of the brain. Skull: Normal Sinuses/Orbits: Normal Other: Few areas of swelling of the scalp which could be infectious inflammation. No large or obviously drainable scalp collection. IMPRESSION: Multiple areas of density in the scalp consistent with foci of soft tissue inflammation. None of these appear large or definitely low-density to suggest drainable collection. No intracranial abnormality. Electronically Signed   By: Paulina Fusi M.D.   On: 02/27/2016 19:17   Ct Chest Wo Contrast  Result Date: 02/27/2016 CLINICAL DATA:  Multiple painful abscesses of the back and neck. Pneumonia on the previous chest radiograph. EXAM: CT CHEST WITHOUT CONTRAST TECHNIQUE: Multidetector CT imaging of the chest was performed following the standard  protocol without IV contrast. COMPARISON:  Chest radiography 02/18/2016. FINDINGS: Cardiovascular: Coronary artery calcification. Aortic atherosclerosis without evidence of aneurysm. Mediastinum/Nodes: No mediastinal mass or lymphadenopathy discernible without contrast. Lungs/Pleura: Bilateral pleural effusions layering dependently Coup left more than right. Mild dependent atelectasis in the right lung. Some patchy atelectasis or scarring anteriorly in the right upper lobe. More considerable density in the left lower lobe that could be a combination of volume loss and left lower lobe pneumonia. Upper Abdomen: See results of abdominal scan. Musculoskeletal: Thoracic spine is negative. There are multiple areas of abnormal soft tissue density within the subcutaneous fat of the thorax consistent with soft tissue infection as described clinically. Largest area is in the left lower lateral chest. IMPRESSION: Small effusions left more than right. Areas of pulmonary atelectasis left more than right. Possibility exists that there could  be some patchy pneumonia, particularly in the left lower lobe. Aortic atherosclerosis.  Coronary artery atherosclerosis. Multiple areas of soft tissue density within the subcutaneous fat of the chest consistent with multiple superficial soft tissue infections as described clinically. Electronically Signed   By: Paulina Fusi M.D.   On: 02/27/2016 19:21    Procedures Procedures (including critical care time)  Medications Ordered in ED Medications - No data to display   Initial Impression / Assessment and Plan / ED Course  I have reviewed the triage vital signs and the nursing notes.  Pertinent labs & imaging results that were available during my care of the patient were reviewed by me and considered in my medical decision making (see chart for details).  Clinical Course   Patient multiple areas of induration and possible abscesses all along her spine and right shoulder and left chest wall. She has no fever or vomiting. There is concern for his history of necrotizing fasciitis.  Patient found to have hyperglycemia, acute kidney injury, no evidence of DKA.  CT scan obtained and shows no drainable deep space infection. She has multiple areas of induration that are draining purulent material but they are not fluctuant. D/w surgery who feels that patient will not need any formal incision and drainage.  No fluctuant areas in the ED appear to need drainage.   Patient started on IV vancomycin and IV invanz due to PCN allergy.  CT does not show any large fluid collections or deep space infection. No gas.   Plan admission for skin infections, AKI, hyperglycemia. D/w Dr. Conley Rolls  CT head stable.  Patient still with L arm weakness x 3 days and may need further stroke workup. D/w Dr. Conley Rolls.  Final Clinical Impressions(s) / ED Diagnoses   Final diagnoses:  Hyperglycemia  Acute renal failure, unspecified acute renal failure type (HCC)  Left arm weakness  Skin infection    New Prescriptions New  Prescriptions   No medications on file     Glynn Octave, MD 02/28/16 1427    Glynn Octave, MD 03/01/16 1312

## 2016-02-27 NOTE — Progress Notes (Signed)
ANTIBIOTIC CONSULT NOTE-Preliminary  Pharmacy Consult for VANCOMYCIN Indication: cellulitis  Allergies  Allergen Reactions  . Other     Per pt some type of mycin "some antibiotic sent me into renal failure."  . Penicillins Rash    Tolerated Zosyn in 07/2015 Has patient had a PCN reaction causing immediate rash, facial/tongue/throat swelling, SOB or lightheadedness with hypotension: No Has patient had a PCN reaction causing severe rash involving mucus membranes or skin necrosis: No Has patient had a PCN reaction that required hospitalization No Has patient had a PCN reaction occurring within the last 10 years: No If all of the above answers are "NO", then may proceed with Cephalosporin use.     Patient Measurements: Height: 5\' 3"  (160 cm) Weight: 200 lb (90.7 kg) IBW/kg (Calculated) : 52.4  Vital Signs: Temp: 97.9 F (36.6 C) (09/19 1805) Temp Source: Oral (09/19 1805) BP: 159/128 (09/19 1805) Pulse Rate: 81 (09/19 1805)  Labs:  Recent Labs  02/27/16 1650 02/27/16 1721  WBC 15.4*  --   HGB 10.8* 12.9  PLT 345  --   CREATININE 2.78* 2.40*    Estimated Creatinine Clearance: 26.6 mL/min (by C-G formula based on SCr of 2.4 mg/dL (H)).  No results for input(s): VANCOTROUGH, VANCOPEAK, VANCORANDOM, GENTTROUGH, GENTPEAK, GENTRANDOM, TOBRATROUGH, TOBRAPEAK, TOBRARND, AMIKACINPEAK, AMIKACINTROU, AMIKACIN in the last 72 hours.   Microbiology: Recent Results (from the past 720 hour(s))  Blood culture (routine x 2)     Status: None   Collection Time: 02/01/16  2:22 AM  Result Value Ref Range Status   Specimen Description BLOOD RIGHT ANTECUBITAL  Final   Special Requests BOTTLES DRAWN AEROBIC AND ANAEROBIC 8CC EACH  Final   Culture NO GROWTH 5 DAYS  Final   Report Status 02/06/2016 FINAL  Final  Blood culture (routine x 2)     Status: None   Collection Time: 02/01/16  2:35 AM  Result Value Ref Range Status   Specimen Description BLOOD RIGHT FOREARM  Final   Special  Requests   Final    BOTTLES DRAWN AEROBIC AND ANAEROBIC AEB=10CC ANA=6CC   Culture NO GROWTH 5 DAYS  Final   Report Status 02/06/2016 FINAL  Final  Urine culture     Status: Abnormal   Collection Time: 02/01/16  2:37 AM  Result Value Ref Range Status   Specimen Description URINE, CLEAN CATCH  Final   Special Requests NONE  Final   Culture >=100,000 COLONIES/mL KLEBSIELLA PNEUMONIAE (A)  Final   Report Status 02/03/2016 FINAL  Final   Organism ID, Bacteria KLEBSIELLA PNEUMONIAE (A)  Final      Susceptibility   Klebsiella pneumoniae - MIC*    AMPICILLIN >=32 RESISTANT Resistant     CEFAZOLIN <=4 SENSITIVE Sensitive     CEFTRIAXONE <=1 SENSITIVE Sensitive     CIPROFLOXACIN <=0.25 SENSITIVE Sensitive     GENTAMICIN <=1 SENSITIVE Sensitive     IMIPENEM <=0.25 SENSITIVE Sensitive     NITROFURANTOIN 64 INTERMEDIATE Intermediate     TRIMETH/SULFA <=20 SENSITIVE Sensitive     AMPICILLIN/SULBACTAM 4 SENSITIVE Sensitive     PIP/TAZO <=4 SENSITIVE Sensitive     Extended ESBL NEGATIVE Sensitive     * >=100,000 COLONIES/mL KLEBSIELLA PNEUMONIAE  Urine culture     Status: Abnormal   Collection Time: 02/17/16 11:35 PM  Result Value Ref Range Status   Specimen Description URINE, RANDOM  Final   Special Requests NONE  Final   Culture >=100,000 COLONIES/mL KLEBSIELLA PNEUMONIAE (A)  Final   Report  Status 02/21/2016 FINAL  Final   Organism ID, Bacteria KLEBSIELLA PNEUMONIAE (A)  Final      Susceptibility   Klebsiella pneumoniae - MIC*    AMPICILLIN >=32 RESISTANT Resistant     CEFAZOLIN <=4 SENSITIVE Sensitive     CEFTRIAXONE <=1 SENSITIVE Sensitive     CIPROFLOXACIN <=0.25 SENSITIVE Sensitive     GENTAMICIN <=1 SENSITIVE Sensitive     IMIPENEM <=0.25 SENSITIVE Sensitive     NITROFURANTOIN <=16 SENSITIVE Sensitive     TRIMETH/SULFA <=20 SENSITIVE Sensitive     AMPICILLIN/SULBACTAM 4 SENSITIVE Sensitive     PIP/TAZO <=4 SENSITIVE Sensitive     Extended ESBL NEGATIVE Sensitive     *  >=100,000 COLONIES/mL KLEBSIELLA PNEUMONIAE    Medical History: Past Medical History:  Diagnosis Date  . Anxiety   . Depression   . GERD (gastroesophageal reflux disease)   . High cholesterol   . Hypertension   . Type II diabetes mellitus (HCC)    Anti-infectives    Start     Dose/Rate Route Frequency Ordered Stop   02/27/16 1900  vancomycin (VANCOCIN) 1,500 mg in sodium chloride 0.9 % 500 mL IVPB     1,500 mg 250 mL/hr over 120 Minutes Intravenous  Once 02/27/16 1833       Assessment: 60yo female with elevated SCr.  Asked to initiate Vancomycin for cellulitis.    Goal of Therapy:  Vancomycin trough level 10-15 mcg/ml  Plan:  Preliminary review of pertinent patient information completed.  Protocol will be initiated with a one-time dose(s) of Vancomycin 1500mg  x 1.  No additional doses needed tonight due to renal fxn.  Jeani HawkingAnnie Penn clinical pharmacist will complete review during morning rounds to assess patient and finalize treatment regimen.  Valrie HartHall, Thersa Mohiuddin A, [place] 02/27/2016,6:35 PM

## 2016-02-27 NOTE — ED Notes (Signed)
Pt ready and stable for transport to ICU.  Report called to Fatima SangerFred Smith, ICU, RN

## 2016-02-28 DIAGNOSIS — N179 Acute kidney failure, unspecified: Secondary | ICD-10-CM

## 2016-02-28 DIAGNOSIS — L8990 Pressure ulcer of unspecified site, unspecified stage: Secondary | ICD-10-CM | POA: Insufficient documentation

## 2016-02-28 DIAGNOSIS — L0291 Cutaneous abscess, unspecified: Secondary | ICD-10-CM

## 2016-02-28 DIAGNOSIS — E870 Hyperosmolality and hypernatremia: Secondary | ICD-10-CM

## 2016-02-28 DIAGNOSIS — I10 Essential (primary) hypertension: Secondary | ICD-10-CM

## 2016-02-28 LAB — BASIC METABOLIC PANEL
ANION GAP: 6 (ref 5–15)
Anion gap: 6 (ref 5–15)
Anion gap: 2 — ABNORMAL LOW (ref 5–15)
Anion gap: 4 — ABNORMAL LOW (ref 5–15)
BUN: 45 mg/dL — AB (ref 6–20)
BUN: 44 mg/dL — AB (ref 6–20)
BUN: 43 mg/dL — AB (ref 6–20)
BUN: 44 mg/dL — AB (ref 6–20)
CALCIUM: 8.1 mg/dL — AB (ref 8.9–10.3)
CALCIUM: 7.4 mg/dL — AB (ref 8.9–10.3)
CALCIUM: 7.9 mg/dL — AB (ref 8.9–10.3)
CHLORIDE: 102 mmol/L (ref 101–111)
CO2: 24 mmol/L (ref 22–32)
CO2: 26 mmol/L (ref 22–32)
CO2: 24 mmol/L (ref 22–32)
CO2: 22 mmol/L (ref 22–32)
CREATININE: 2.22 mg/dL — AB (ref 0.44–1.00)
CREATININE: 2.13 mg/dL — AB (ref 0.44–1.00)
Calcium: 7.9 mg/dL — ABNORMAL LOW (ref 8.9–10.3)
Chloride: 102 mmol/L (ref 101–111)
Chloride: 105 mmol/L (ref 101–111)
Chloride: 108 mmol/L (ref 101–111)
Creatinine, Ser: 2.33 mg/dL — ABNORMAL HIGH (ref 0.44–1.00)
Creatinine, Ser: 2.16 mg/dL — ABNORMAL HIGH (ref 0.44–1.00)
GFR calc Af Amer: 25 mL/min — ABNORMAL LOW (ref >60)
GFR calc Af Amer: 27 mL/min — ABNORMAL LOW (ref >60)
GFR calc Af Amer: 27 mL/min — ABNORMAL LOW (ref >60)
GFR calc Af Amer: 28 mL/min — ABNORMAL LOW (ref >60)
GFR calc non Af Amer: 22 mL/min — ABNORMAL LOW (ref >60)
GFR, EST NON AFRICAN AMERICAN: 24 mL/min — AB (ref >60)
GFR, EST NON AFRICAN AMERICAN: 23 mL/min — AB (ref >60)
GFR, EST NON AFRICAN AMERICAN: 24 mL/min — AB (ref >60)
GLUCOSE: 520 mg/dL — AB (ref 65–99)
GLUCOSE: 82 mg/dL (ref 65–99)
Glucose, Bld: 298 mg/dL — ABNORMAL HIGH (ref 65–99)
Glucose, Bld: 44 mg/dL — CL (ref 65–99)
POTASSIUM: 3.5 mmol/L (ref 3.5–5.1)
POTASSIUM: 3 mmol/L — AB (ref 3.5–5.1)
POTASSIUM: 3.6 mmol/L (ref 3.5–5.1)
Potassium: 3.9 mmol/L (ref 3.5–5.1)
SODIUM: 134 mmol/L — AB (ref 135–145)
SODIUM: 131 mmol/L — AB (ref 135–145)
SODIUM: 134 mmol/L — AB (ref 135–145)
Sodium: 132 mmol/L — ABNORMAL LOW (ref 135–145)

## 2016-02-28 LAB — GLUCOSE, CAPILLARY
GLUCOSE-CAPILLARY: 100 mg/dL — AB (ref 65–99)
GLUCOSE-CAPILLARY: 105 mg/dL — AB (ref 65–99)
GLUCOSE-CAPILLARY: 108 mg/dL — AB (ref 65–99)
GLUCOSE-CAPILLARY: 140 mg/dL — AB (ref 65–99)
GLUCOSE-CAPILLARY: 143 mg/dL — AB (ref 65–99)
GLUCOSE-CAPILLARY: 178 mg/dL — AB (ref 65–99)
GLUCOSE-CAPILLARY: 21 mg/dL — AB (ref 65–99)
GLUCOSE-CAPILLARY: 224 mg/dL — AB (ref 65–99)
GLUCOSE-CAPILLARY: 273 mg/dL — AB (ref 65–99)
GLUCOSE-CAPILLARY: 362 mg/dL — AB (ref 65–99)
GLUCOSE-CAPILLARY: 598 mg/dL — AB (ref 65–99)
GLUCOSE-CAPILLARY: 92 mg/dL (ref 65–99)
GLUCOSE-CAPILLARY: 98 mg/dL (ref 65–99)
Glucose-Capillary: >600 mg/dL (ref 65–99)
Glucose-Capillary: >600 mg/dL (ref 65–99)
Glucose-Capillary: 500 mg/dL — ABNORMAL HIGH (ref 65–99)
Glucose-Capillary: 423 mg/dL — ABNORMAL HIGH (ref 65–99)
Glucose-Capillary: 362 mg/dL — ABNORMAL HIGH (ref 65–99)
Glucose-Capillary: 244 mg/dL — ABNORMAL HIGH (ref 65–99)
Glucose-Capillary: 19 mg/dL — CL (ref 65–99)
Glucose-Capillary: 115 mg/dL — ABNORMAL HIGH (ref 65–99)
Glucose-Capillary: 44 mg/dL — CL (ref 65–99)

## 2016-02-28 LAB — BLOOD CULTURE ID PANEL (REFLEXED)
Acinetobacter baumannii: NOT DETECTED
CANDIDA GLABRATA: NOT DETECTED
CANDIDA TROPICALIS: NOT DETECTED
Candida albicans: NOT DETECTED
Candida krusei: NOT DETECTED
Candida parapsilosis: NOT DETECTED
Enterobacter cloacae complex: NOT DETECTED
Enterobacteriaceae species: NOT DETECTED
Enterococcus species: NOT DETECTED
Escherichia coli: NOT DETECTED
HAEMOPHILUS INFLUENZAE: NOT DETECTED
KLEBSIELLA PNEUMONIAE: NOT DETECTED
Klebsiella oxytoca: NOT DETECTED
Listeria monocytogenes: NOT DETECTED
METHICILLIN RESISTANCE: DETECTED — AB
NEISSERIA MENINGITIDIS: NOT DETECTED
PROTEUS SPECIES: NOT DETECTED
Pseudomonas aeruginosa: NOT DETECTED
STAPHYLOCOCCUS SPECIES: DETECTED — AB
STREPTOCOCCUS SPECIES: NOT DETECTED
Serratia marcescens: NOT DETECTED
Staphylococcus aureus (BCID): DETECTED — AB
Streptococcus agalactiae: NOT DETECTED
Streptococcus pneumoniae: NOT DETECTED
Streptococcus pyogenes: NOT DETECTED

## 2016-02-28 LAB — CBC
HEMATOCRIT: 31 % — AB (ref 36.0–46.0)
HEMOGLOBIN: 10.3 g/dL — AB (ref 12.0–15.0)
MCH: 29.4 pg (ref 26.0–34.0)
MCHC: 33.2 g/dL (ref 30.0–36.0)
MCV: 88.6 fL (ref 78.0–100.0)
Platelets: 325 10*3/uL (ref 150–400)
RBC: 3.5 MIL/uL — ABNORMAL LOW (ref 3.87–5.11)
RDW: 14.9 % (ref 11.5–15.5)
WBC: 15.1 10*3/uL — ABNORMAL HIGH (ref 4.0–10.5)

## 2016-02-28 LAB — MRSA PCR SCREENING: MRSA BY PCR: POSITIVE — AB

## 2016-02-28 MED ORDER — VANCOMYCIN HCL IN DEXTROSE 1-5 GM/200ML-% IV SOLN
1000.0000 mg | INTRAVENOUS | Status: DC
  Administered 2016-02-28 – 2016-03-01 (×3): 1000 mg via INTRAVENOUS
  Filled 2016-02-28 (×2): qty 200

## 2016-02-28 MED ORDER — POTASSIUM CHLORIDE CRYS ER 20 MEQ PO TBCR
40.0000 meq | EXTENDED_RELEASE_TABLET | ORAL | Status: AC
  Administered 2016-02-28 (×2): 40 meq via ORAL
  Filled 2016-02-28 (×2): qty 2

## 2016-02-28 MED ORDER — DEXTROSE 50 % IV SOLN
INTRAVENOUS | Status: AC
  Administered 2016-02-28: 50 mL
  Filled 2016-02-28: qty 50

## 2016-02-28 MED ORDER — DEXTROSE 50 % IV SOLN: 1.0000 | INTRAVENOUS | Status: AC

## 2016-02-28 MED ORDER — CHLORHEXIDINE GLUCONATE CLOTH 2 % EX PADS
6.0000 | MEDICATED_PAD | Freq: Every day | CUTANEOUS | Status: AC
  Administered 2016-02-28 – 2016-03-03 (×5): 6 via TOPICAL

## 2016-02-28 MED ORDER — INSULIN ASPART 100 UNIT/ML ~~LOC~~ SOLN: 0.0000 [IU] | Freq: Three times a day (TID) | SUBCUTANEOUS | Status: DC

## 2016-02-28 MED ORDER — MUPIROCIN 2 % EX OINT
1.0000 "application " | TOPICAL_OINTMENT | Freq: Two times a day (BID) | CUTANEOUS | Status: AC
  Administered 2016-02-28 – 2016-03-03 (×10): 1 via NASAL
  Filled 2016-02-28: qty 22

## 2016-02-28 MED ORDER — DEXTROSE 50 % IV SOLN
INTRAVENOUS | Status: AC
  Administered 2016-02-28: 50 mL via INTRAVENOUS
  Filled 2016-02-28: qty 50

## 2016-02-28 MED ORDER — INFLUENZA VAC SPLIT QUAD 0.5 ML IM SUSY
0.5000 mL | PREFILLED_SYRINGE | INTRAMUSCULAR | Status: AC
  Administered 2016-03-01: 0.5 mL via INTRAMUSCULAR
  Filled 2016-02-28: qty 0.5

## 2016-02-28 MED ORDER — MEROPENEM 1 G IV SOLR
INTRAVENOUS | Status: AC
  Filled 2016-02-28: qty 1

## 2016-02-28 MED ORDER — INSULIN GLARGINE 100 UNIT/ML ~~LOC~~ SOLN
18.0000 [IU] | Freq: Two times a day (BID) | SUBCUTANEOUS | Status: DC
Start: 2016-02-29 — End: 2016-02-29
  Filled 2016-02-28 (×3): qty 0.18

## 2016-02-28 MED ORDER — DEXTROSE 10 % IV SOLN
INTRAVENOUS | Status: DC
  Administered 2016-02-28: 25 mL via INTRAVENOUS

## 2016-02-28 MED ORDER — INSULIN ASPART 100 UNIT/ML ~~LOC~~ SOLN: 0.0000 [IU] | Freq: Every day | SUBCUTANEOUS | Status: DC

## 2016-02-28 MED ORDER — INSULIN GLARGINE 100 UNIT/ML ~~LOC~~ SOLN
40.0000 [IU] | Freq: Every day | SUBCUTANEOUS | Status: DC
  Administered 2016-02-28: 40 [IU] via SUBCUTANEOUS
  Filled 2016-02-28 (×2): qty 0.4

## 2016-02-28 MED ORDER — DEXTROSE 50 % IV SOLN
1.0000 | INTRAVENOUS | Status: AC
  Administered 2016-02-28: 50 mL via INTRAVENOUS

## 2016-02-28 NOTE — Progress Notes (Signed)
PROGRESS NOTE    [name]  WUJ:[id number] DOB: 1956-05-29 DOA: 02/27/2016 PCP: [name], MD    Brief Narrative:  58 yof with a hx of HTN, HLD, DM type 2, and CKD stage 3 presented with complaints of worsening back and chest abscesses. While in the ED she was noted to be hyponatremic and hyperglycemic. She had an elevated BUN 45, Cr 2.33, and WBC 15,100. She was started on IV vancomycin and Meropenem. She was admitted for further evaluation.   Assessment & Plan:   Principal Problem:   Abscess of skin and subcutaneous tissue Active Problems:   Hypertension   HLD (hyperlipidemia)   CKD (chronic kidney disease), stage III   Paroxysmal SVT (supraventricular tachycardia) (HCC)   Hyperglycemia   Hyperosmolarity syndrome  1. Multiple Abcesses of skin and subcutaneous tissue. Blood cultures positive for gram positive in clusters. Continue IV Vancomycin and Meropenem. Case was discussed by ED with Gen surgery who felt patient may benefit from trip to OR. Will place formal consult. Continue to monitor.  2. Staph bacteremia. Related to skin infection. Continue on vancomycin and follow up sensitivities. 3. Hyperosmolarity syndrome. Likely due to infection. Blood sugars have improved with insulin infusion. anion gap is closed. Will transition back to subcutaneous insulin. Will continue SSI and IV fluids. Monitor glucose closely.  4. DM type 2. Recent A1c is 8.5. Blood sugars likely elevated due to infection/recent steroid use. Continue to monitor..  5. AKI. Creatinine was normal on last admission. Likely related to dehydration and volume depletion. continue IV fluids. Continue to hold ACE I.  6.  Hyponatremia. Likely has some element of pseudohyponatremia in the setting of hyperglycemia. Also likely has some volume depletion. Improving with IV fluids. 7. HTN. Stable. Continue lopressor 8. HLD. Continue statins 9. GERD. Continue PPI   DVT prophylaxis: Heparin  Code Status: Full    Family Communication: No family bedside Disposition Plan: Discharge home once improved.    Consultants:   None   Procedures:   None   Antimicrobials:   Vancomycin 9/20 >>  Meropenem 9/20 >>    Subjective: Has pain in back, shoulder. No shortness of breath or chest pain. Says some sites are draining purulent fluid, while others are not.  Objective: Vitals:   02/28/16 0300 02/28/16 0400 02/28/16 0500 02/28/16 0600  BP: 133/64 129/67 (!) 127/59 124/63  Pulse: 81 79 78 80  Resp: (!) 22 (!) 22 20 (!) 23  Temp:  98.8 F (37.1 C)    TempSrc:  Oral    SpO2: 95% 96% 95% 96%  Weight:   93.1 kg (205 lb 4 oz)   Height:        Intake/Output Summary (Last 24 hours) at 02/28/16 0641 Last data filed at 02/28/16 0500  Gross per 24 hour  Intake           1887.8 ml  Output                0 ml  Net           1887.8 ml   Filed Weights   02/27/16 1538 02/27/16 2207 02/28/16 0500  Weight: 90.7 kg (200 lb) 93.1 kg (205 lb 4 oz) 93.1 kg (205 lb 4 oz)    Examination:  General exam: Appears calm and comfortable  Respiratory system: Clear to auscultation. Respiratory effort normal. Cardiovascular system: S1 & S2 heard, RRR. No JVD, murmurs, rubs, gallops or clicks. No pedal edema. Gastrointestinal system: Abdomen is nondistended, soft  and nontender. No organomegaly or masses felt. Normal bowel sounds heard. Central nervous system: Alert and oriented. No focal neurological deficits. Extremities: Symmetric 5 x 5 power. Skin: multiple areas of induration over upper back. Right shoulder , left scapular area Psychiatry: Judgement and insight appear normal. Mood & affect appropriate.     Data Reviewed: I have personally reviewed following labs and imaging studies  CBC:  Recent Labs Lab 02/27/16 1650 02/27/16 1721 02/28/16 0330  WBC 15.4*  --  15.1*  NEUTROABS 14.2*  --   --   HGB 10.8* 12.9 10.3*  HCT 33.9* 38.0 31.0*  MCV 92.6  --  88.6  PLT 345  --  325   Basic  Metabolic Panel:  Recent Labs Lab 02/27/16 1650 02/27/16 1721 02/27/16 2022 02/28/16 0330  NA 126* 129* 126* 132*  K 4.0 4.1 3.9 3.5  CL 90* 90* 95* 102  CO2 26  --  24 24  GLUCOSE 967* >700* 863* 520*  BUN 50* 42* 49* 45*  CREATININE 2.78* 2.40* 2.72* 2.33*  CALCIUM 8.4*  --  7.8* 7.9*   GFR: Estimated Creatinine Clearance: 27.8 mL/min (by C-G formula based on SCr of 2.33 mg/dL (H)). Liver Function Tests:  Recent Labs Lab 02/27/16 1650  AST 15  ALT 16  ALKPHOS 160*  BILITOT 0.6  PROT 6.9  ALBUMIN 1.9*   No results for input(s): LIPASE, AMYLASE in the last 168 hours. No results for input(s): AMMONIA in the last 168 hours. Coagulation Profile:  Recent Labs Lab 02/27/16 1650  INR 1.16   Cardiac Enzymes: No results for input(s): CKTOTAL, CKMB, CKMBINDEX, TROPONINI in the last 168 hours. BNP (last 3 results) No results for input(s): PROBNP in the last 8760 hours. HbA1C: No results for input(s): HGBA1C in the last 72 hours. CBG:  Recent Labs Lab 02/28/16 0246 02/28/16 0330 02/28/16 0432 02/28/16 0545 02/28/16 0633  GLUCAP >600* 500* 423* 362* 362*   Lipid Profile: No results for input(s): CHOL, HDL, LDLCALC, TRIG, CHOLHDL, LDLDIRECT in the last 72 hours. Thyroid Function Tests: No results for input(s): TSH, T4TOTAL, FREET4, T3FREE, THYROIDAB in the last 72 hours. Anemia Panel: No results for input(s): VITAMINB12, FOLATE, FERRITIN, TIBC, IRON, RETICCTPCT in the last 72 hours. Sepsis Labs:  Recent Labs Lab 02/27/16 1720 02/27/16 2022  LATICACIDVEN 2.0* 3.5*    No results found for this or any previous visit (from the past 240 hour(s)).       Radiology Studies: Ct Abdomen Pelvis Wo Contrast  Result Date: 02/27/2016 CLINICAL DATA:  Multiple painful abscesses throughout the body. EXAM: CT ABDOMEN AND PELVIS WITHOUT CONTRAST TECHNIQUE: Multidetector CT imaging of the abdomen and pelvis was performed following the standard protocol without IV  contrast. COMPARISON:  02/17/2016 FINDINGS: Hepatobiliary: Liver appears unremarkable without contrast. Pancreas: Negative Spleen: Normal Adrenals/Urinary Tract: Adrenal glands are normal. Renal margins appear slightly indistinct which could go along with nephritis. No evidence of mass, abscess or obstruction. Stomach/Bowel: No primary bowel pathology. Ventral hernia which contains fat and a segment of transverse colon. Vascular/Lymphatic: Aortic atherosclerosis. No aneurysm. IVC is normal. No retroperitoneal mass or lymphadenopathy. Reproductive: Uterus and adnexal regions appear unremarkable. Other: No ascites or free air. Musculoskeletal: Ordinary degenerative changes affect the lumbar spine. There are areas of edema within the subcutaneous fat of the abdomen which could be nonspecific edema or areas of inflammation. IMPRESSION: Indistinct renal margins which could go along with nephritis. No evidence of abscess or obstruction. Ventral hernia containing fat and a segment of  transverse colon. No obstruction. Edema pattern within the subcutaneous tissues which could be due to simple edema or inflammatory edema. Electronically Signed   By: Paulina Fusi M.D.   On: 02/27/2016 19:26   Ct Head Wo Contrast  Result Date: 02/27/2016 CLINICAL DATA:  Painful abscesses of the back and neck. Elevated creatinine. EXAM: CT HEAD WITHOUT CONTRAST TECHNIQUE: Contiguous axial images were obtained from the base of the skull through the vertex without intravenous contrast. COMPARISON:  None. FINDINGS: Brain: Normal appearance of the brain without evidence of old or acute infarction, mass lesion, hemorrhage, hydrocephalus or extra-axial collection. Vascular: There is atherosclerotic calcification of the major vessels at the base of the brain. Skull: Normal Sinuses/Orbits: Normal Other: Few areas of swelling of the scalp which could be infectious inflammation. No large or obviously drainable scalp collection. IMPRESSION: Multiple  areas of density in the scalp consistent with foci of soft tissue inflammation. None of these appear large or definitely low-density to suggest drainable collection. No intracranial abnormality. Electronically Signed   By: Paulina Fusi M.D.   On: 02/27/2016 19:17   Ct Chest Wo Contrast  Result Date: 02/27/2016 CLINICAL DATA:  Multiple painful abscesses of the back and neck. Pneumonia on the previous chest radiograph. EXAM: CT CHEST WITHOUT CONTRAST TECHNIQUE: Multidetector CT imaging of the chest was performed following the standard protocol without IV contrast. COMPARISON:  Chest radiography 02/18/2016. FINDINGS: Cardiovascular: Coronary artery calcification. Aortic atherosclerosis without evidence of aneurysm. Mediastinum/Nodes: No mediastinal mass or lymphadenopathy discernible without contrast. Lungs/Pleura: Bilateral pleural effusions layering dependently Coup left more than right. Mild dependent atelectasis in the right lung. Some patchy atelectasis or scarring anteriorly in the right upper lobe. More considerable density in the left lower lobe that could be a combination of volume loss and left lower lobe pneumonia. Upper Abdomen: See results of abdominal scan. Musculoskeletal: Thoracic spine is negative. There are multiple areas of abnormal soft tissue density within the subcutaneous fat of the thorax consistent with soft tissue infection as described clinically. Largest area is in the left lower lateral chest. IMPRESSION: Small effusions left more than right. Areas of pulmonary atelectasis left more than right. Possibility exists that there could be some patchy pneumonia, particularly in the left lower lobe. Aortic atherosclerosis.  Coronary artery atherosclerosis. Multiple areas of soft tissue density within the subcutaneous fat of the chest consistent with multiple superficial soft tissue infections as described clinically. Electronically Signed   By: Paulina Fusi M.D.   On: 02/27/2016 19:21         Scheduled Meds: . sodium chloride   Intravenous STAT  . aspirin EC  325 mg Oral Daily  . collagenase   Topical Daily  . FLUoxetine  20 mg Oral BID  . heparin  5,000 Units Subcutaneous Q8H  . [START ON 02/29/2016] Influenza vac split quadrivalent PF  0.5 mL Intramuscular Tomorrow-1000  . meropenem (MERREM) IV  1 g Intravenous Q12H  . metoprolol tartrate  25 mg Oral BID  . pantoprazole  40 mg Oral Daily  . pravastatin  40 mg Oral QHS  . sodium chloride flush  3 mL Intravenous Q12H   Continuous Infusions: . sodium chloride 100 mL (02/27/16 2300)  . insulin (NOVOLIN-R) infusion 17.6 Units/hr (02/28/16 0333)     LOS: 1 day    Time spent: 25 minutes     Erick Blinks, MD [facility] If 7PM-7AM, please contact night-coverage [email or web address] Password [email or web address] 02/28/2016, 6:41 AM

## 2016-02-28 NOTE — Progress Notes (Signed)
Hypoglycemic Event  CBG: 19  Treatment: D50 IV 50 mL  Symptoms: None  Follow-up CBG: Time:1705 CBG Result:140  Possible Reasons for Event: Inadequate meal intake  Comments/MD notified:Memon, new orders received and carried out    [employer] CompanySchonewitz, Candelaria StagersLeigh Anne

## 2016-02-28 NOTE — Progress Notes (Signed)
Called Dr. Earlene Plateravis [place] and had to leave a message regarding new consult.

## 2016-02-28 NOTE — Care Management Note (Signed)
Case Management Note  Patient Details  Name: [name] MRN: [id number] Date of Birth: January 07, 1956  Subjective/Objective:                  Pt admitted with abcesses. She is from home, she reports living with her sister. [facility] She is ind with ADL's. Ambulates ind with no assistive devices. She does not wear oxgyen at home. She plans to return home at DC. Anticipate pt will need HH services at DC. Pt drowsy during assessment.   Action/Plan: Will re-visit pt to discuss DC plan in more detail at later date when pt more alert.   Expected Discharge Date:     03/04/2016             Expected Discharge Plan:  Home w Home Health Services  In-House Referral:  NA  Discharge planning Services  CM Consult  Post Acute Care Choice:  Home Health Choice offered to:  Patient  DME Arranged:    DME Agency:     HH Arranged:    HH Agency:     Status of Service:  In process, will continue to follow  If discussed at Long Length of Stay Meetings, dates discussed:    Additional Comments:  [name], Sophia Zeiders Demske, RN 02/28/2016, 1:39 PM

## 2016-02-28 NOTE — Progress Notes (Signed)
Hypoglycemic Event  CBG: lab called cbg 44, poct 21 Treatment: D50 IV 50 mL  Symptoms: None  Follow-up CBG: Time:1645 CBG Result: 19  Possible Reasons for Event: Inadequate meal intake  Comments/MD notified:memon    Schonewitz, Candelaria StagersLeigh Anne

## 2016-02-28 NOTE — Progress Notes (Signed)
Pharmacy Antibiotic Note  [name] [name] is [name] 60 y.o. female admitted on 02/27/2016 with cellulitis.  Pharmacy has been consulted for VANCOMYCIN dosing.  Pt with progressively worsened back and chest abscesses.  Pt has reportedly been on dialysis in the past.  Plan:  Vancomycin 1000mg  IV q24h (renally adjusted) Check trough at steady state Meropenem 1gm IV q12hrs (per MD) Monitor labs, renal fxn, progress and c/s Deescalate ABX when improved / appropriate.    Height: 5\' 3"  (160 cm) Weight: 205 lb 4 oz (93.1 kg) IBW/kg (Calculated) : 52.4  Temp (24hrs), Avg:98.3 F (36.8 C), Min:97.9 F (36.6 C), Max:98.8 F (37.1 C)   Recent Labs Lab 02/27/16 1650 02/27/16 1720 02/27/16 1721 02/27/16 2022 02/28/16 0330 02/28/16 0712  WBC 15.4*  --   --   --  15.1*  --   CREATININE 2.78*  --  2.40* 2.72* 2.33* 2.16*  LATICACIDVEN  --  2.0*  --  3.5*  --   --     Estimated Creatinine Clearance: 30 mL/min (by C-G formula based on SCr of 2.16 mg/dL (H)).    Allergies  Allergen Reactions  . Other     Per pt some type of mycin "some antibiotic sent me into renal failure."  . Penicillins Rash    Tolerated Zosyn in 07/2015 Has patient had [name] reaction causing immediate rash, facial/tongue/throat swelling, SOB or lightheadedness with hypotension: No Has patient had [name] reaction causing severe rash involving mucus membranes or skin necrosis: No Has patient had [name] reaction that required hospitalization No Has patient had [name] reaction occurring within the last 10 years: No If all of the above answers are "NO", then may proceed with Cephalosporin use.    Antimicrobials this admission: Vancomycin 9/19 >>  Meropenem 9/19 >>   Dose adjustments this admission:  No results found for this or any previous visit (from the past 240 hour(s)).  Thank you for allowing pharmacy to be [name] part of this patient's care.  [name], [name] 02/28/2016 9:08 AM

## 2016-02-28 NOTE — Consult Note (Signed)
SURGICAL CONSULTATION NOTE (initial) - cpt: 99254  HISTORY OF PRESENT ILLNESS (HPI):  All of the history that follows is obtained from chart review and discussion with ICU RN's and ED, as patient was not providing any verbal responses to greeting or questions at time of evaluation.  60 y.o. female presented to [facility] ED with cutaneous back pain that she reported at that time has been ongoing for several weeks and has become more sore during the interval period since she was evaluated in the ED several weeks ago for a non-draining rash of her Right shoulder, thoracic and lumbar spine, and Left chest wall. While in the ED, patient was noted to have a blood glucose >900 in the context of recent steroids with a HbA1C of 8.5, and she was admitted to the ICU with insulin and IV antibiotics. Patient has previously undergone incision and drainage at [facility] of Right axillary abscess (10/2015) and debridement at [facility] of Right groin/thigh necrotizing fasciitis (07/2015). Since admission, staph aureus bactremia has been identified.  Of note, ED physician approached me yesterday ~5:30 pm about a patient (in retrospect, this patient) with back pustules depicted to me in a photograph he'd obtained of the patient's back while I was seeing another patient in the ED. As I discussed with the ED physician, any areas of cutaneous fluctuance in general should be incised and drained and can be performed by ED physician at bedside. At that time, it was reported that there appeared to be no areas of focal fluctuance, and it appears no drainage was performed by the ED physician. ED physician at that time stated he was considering CT imaging, but no imaging had yet been obtained and was accordingly not discussed nor reviewed with me. Surgery was not consulted at that time, I accordingly did not evaluate the patient at that time, and no recommendation for anything operative was ever advised during this conversation. He stated the patient was  being admitted for blood glucose >900.  Surgery is consulted by medical physician Dr. Kerry Hough in this context for evaluation and management of back lesions/cutaneous infection.  PAST MEDICAL HISTORY (PMH):  Past Medical History:  Diagnosis Date  . Anxiety   . Depression   . GERD (gastroesophageal reflux disease)   . High cholesterol   . Hypertension   . Type II diabetes mellitus (HCC)      PAST SURGICAL HISTORY (PSH):  Past Surgical History:  Procedure Laterality Date  . APPLICATION OF WOUND VAC Right 10/25/2015   Procedure: APPLICATION OF WOUND VAC;  Surgeon: Franky Macho, MD;  Location: [facility];  Service: General;  Laterality: Right;  . CESAREAN SECTION  1984  . INCISION AND DRAINAGE Right 08/09/2015   Procedure: INCISION AND DRAINAGE;  Surgeon: De Blanch Kinsinger, MD;  Location: [facility] OR;  Service: General;  Laterality: Right;  . INCISION AND DRAINAGE ABSCESS Right 08/08/2015   Procedure: Irrigation and Debridement of Right Lower Ext.;  Surgeon: Axel Filler, MD;  Location: [facility] OR;  Service: General;  Laterality: Right;  . INCISION AND DRAINAGE ABSCESS Right 10/25/2015   Procedure: INCISION AND DRAINAGE ABSCESS RIGHT AXILLARY ABSCESS;  Surgeon: Franky Macho, MD;  Location: [facility];  Service: General;  Laterality: Right;     MEDICATIONS:  Prior to Admission medications   Medication Sig Start Date End Date Taking? Authorizing Provider  albuterol (PROAIR HFA) 108 (90 Base) MCG/ACT inhaler Inhale 2 puffs into the lungs every 6 (six) hours as needed for wheezing or shortness of breath.  Yes Historical Provider, MD  aspirin EC 325 MG tablet Take 325 mg by mouth daily.   Yes Historical Provider, MD  clobetasol cream (TEMOVATE) 0.05 % Apply topically 2 (two) times daily. 09/21/15  Yes Leroy SeaPrashant K Singh, MD  collagenase (SANTYL) ointment Apply in a nickel thickness to left achilles wound in the wound bed only, being careful not to get on surrounding skin.  Cover with dry gauze. 02/03/16  Yes  Beather Arbourushil V Patel, MD  FLUoxetine (PROZAC) 20 MG capsule Take 20 mg by mouth 2 (two) times daily.  05/11/15  Yes Historical Provider, MD  HYDROmorphone (DILAUDID) 4 MG tablet Take 4 mg by mouth 2 (two) times daily as needed. For pain 11/09/15  Yes Historical Provider, MD  insulin aspart (NOVOLOG FLEXPEN) 100 UNIT/ML FlexPen Inject 3 Units into the skin 3 (three) times daily with meals.   Yes Historical Provider, MD  insulin glargine (LANTUS) 100 UNIT/ML injection Inject 0.18 mLs (18 Units total) into the skin 2 (two) times daily. Patient taking differently: Inject 18 Units into the skin at bedtime.  08/15/15  Yes Leroy SeaPrashant K Singh, MD  lisinopril (PRINIVIL,ZESTRIL) 20 MG tablet Take 1 tablet (20 mg total) by mouth daily. 02/03/16  Yes Beather Arbourushil V Patel, MD  metoprolol tartrate (LOPRESSOR) 25 MG tablet Take 1 tablet (25 mg total) by mouth 2 (two) times daily. 09/21/15  Yes Leroy SeaPrashant K Singh, MD  Multiple Vitamin (MULTIVITAMIN WITH MINERALS) TABS tablet Take 1 tablet by mouth daily. 10/26/15  Yes Estela Isaiah BlakesY Hernandez Acosta, MD  pantoprazole (PROTONIX) 40 MG tablet Take 1 tablet (40 mg total) by mouth daily. 02/03/16  Yes Beather Arbourushil V Patel, MD  pravastatin (PRAVACHOL) 40 MG tablet Take 40 mg by mouth at bedtime.  05/11/15  Yes Historical Provider, MD  QUEtiapine (SEROQUEL) 25 MG tablet Take 1 tablet by mouth at bedtime.  12/22/15  Yes Historical Provider, MD  ciprofloxacin (CIPRO) 500 MG tablet Take 1 tablet (500 mg total) by mouth 2 (two) times daily. Patient not taking: Reported on 02/27/2016 02/18/16   Henderson CloudEstela Y Hernandez Acosta, MD  predniSONE (DELTASONE) 20 MG tablet Take 40 mg by mouth daily with breakfast.    Historical Provider, MD     ALLERGIES:  Allergies  Allergen Reactions  . Other     Per pt some type of mycin "some antibiotic sent me into renal failure."  . Penicillins Rash    Tolerated Zosyn in 07/2015 Has patient had a PCN reaction causing immediate rash, facial/tongue/throat swelling, SOB or  lightheadedness with hypotension: No Has patient had a PCN reaction causing severe rash involving mucus membranes or skin necrosis: No Has patient had a PCN reaction that required hospitalization No Has patient had a PCN reaction occurring within the last 10 years: No If all of the above answers are "NO", then may proceed with Cephalosporin use.      SOCIAL HISTORY:  Social History   Social History  . Marital status: Divorced    Spouse name: N/A  . Number of children: N/A  . Years of education: N/A   Occupational History  . Not on file.   Social History Main Topics  . Smoking status: Current Every Day Smoker    Packs/day: 0.10    Years: 35.00    Types: Cigarettes  . Smokeless tobacco: Never Used  . Alcohol use 0.6 oz/week    1 Glasses of wine per week  . Drug use: No  . Sexual activity: Not Currently   Other Topics Concern  .  Not on file   Social History Narrative  . No narrative on file    The patient currently resides (home / rehab facility / nursing home): Home The patient normally is (ambulatory / bedbound): Ambulatory  FAMILY HISTORY:  History reviewed. No pertinent family history.    REVIEW OF SYSTEMS:  Unable to assess Review of Systems due to lack of verbal response from patient during evaluation  VITAL SIGNS:  Temp:  [97.2 F (36.2 C)-98.8 F (37.1 C)] 97.2 F (36.2 C) (09/20 2000) Pulse Rate:  [71-91] 71 (09/20 2000) Resp:  [10-27] 17 (09/20 2000) BP: (109-137)/(49-78) 119/61 (09/20 2133) SpO2:  [92 %-98 %] 98 % (09/20 2000) Weight:  [93.1 kg (205 lb 4 oz)] 93.1 kg (205 lb 4 oz) (09/20 0500)     Height: 5\' 3"  (160 cm) Weight: 93.1 kg (205 lb 4 oz) BMI (Calculated): 36.4   INTAKE/OUTPUT:  This shift: Total I/O In: 300 [I.V.:300] Out: -   Last 2 shifts: @IOLAST2SHIFTS @   PHYSICAL EXAM:  Constitutional:  -- Obese body habitus (BMI > 36) -- Patient does not provide any verbal responses to greeting or questions asked by myself or patient's RN  also present during my exam, though patient's gaze is directed towards verbal stimuli appropriately  Eyes:  -- Pupils equally round and reactive to light  -- No scleral icterus  Ear, nose, and throat:  -- No jugular venous distension  Pulmonary:  -- Equal breath sounds bilaterally -- Breathing non-labored at rest, no evidence of respiratory distress Cardiovascular:  -- Regular sinus rhythm on monitor Gastrointestinal:  -- Abdomen soft, nontender, nondistended, no guarding/rebound  -- No abdominal masses appreciated, pulsatile or otherwise  Musculoskeletal / Integumentary:  -- Wounds or skin discoloration: retraction of Right groin dressing reveals healing surgical wound consistent with patient's prior debridement of necrotizing fasciitis at this site; there are diffuse pustules across all of patient's back, from which thick whitish pink fluid drains along with similar milky fluid from all of patient's pores of affected skin, which is tender when compressed to produce drainage; there is also extensive induration of patient's lower back and a carbuncle of her Right shoulder, but no discrete fluctuance -- Extremities: B/L UE and LE FROM, hands and feet warm Neurologic:  -- Motor function: appears intact and symmetric -- Sensation: unable to assess   Labs:  CBC:  Lab Results  Component Value Date   WBC 15.1 (H) 02/28/2016   RBC 3.50 (L) 02/28/2016   BMP:  Lab Results  Component Value Date   GLUCOSE 44 (LL) 02/28/2016   CO2 24 02/28/2016   BUN 43 (H) 02/28/2016   CREATININE 2.22 (H) 02/28/2016   CALCIUM 7.4 (L) 02/28/2016     Imaging studies:  CT Chest, Abdomen, and Pelvis (02/27/2016) - personally reviewed, no subcutaneous or deep tissue fluid or gas collections to suggest abscess or gas-forming bacterial infection, respectively Small pleural effusions, left more than right. Areas of pulmonary atelectasis, left more than right. Possibility exists that there could be some patchy  pneumonia, particularly in the left lower lobe.  Aortic atherosclerosis. Coronary artery atherosclerosis.  Multiple areas of soft tissue density within the subcutaneous fat of the chest consistent with multiple superficial soft tissue infections.  Assessment/Plan: (ICD-10's: L73.9, L02.92) 60 y.o. female with uncontrolled diabetes vs steroid-associated hyperglycemia, diffuse pustular bacterial furunculosis vs fungal folliculitis without any appreciable fluctuance to suggest a discrete/focal abscess at time of exam, complicated by staphylococcal bacteremia, AKI/CKD, leukocytosis, and pertinent comorbidities including morbid obesity,  diabetes mellitus, HTN, HLD, CKD, and asthma.   - no indication for surgical intervention at this time  - continue IV antibiotics for staphylococcal bacteremia along with supportive care  - frequent warm compresses, topical antiseptic washes, consider topical antibiotics (mupirocin, etc)  - strict blood glucose control and medical management of co-morbididites per medicine team  - consider topical vs systemic fluconazole if no improvement with antibiotics  - maximize exposure of affected skin to air (frequent repositioning)  - DVT prophylaxis  All of the above findings and recommendations were discussed with the ICU team.  Thank you for the opportunity to participate in this patient's care.   -- Scherrie Gerlach Earlene Plater, MD, RPVI [place]: [facility] General [facility] V[facility] Surgery Office: [phone]

## 2016-02-29 ENCOUNTER — Inpatient Hospital Stay (HOSPITAL_BASED_OUTPATIENT_CLINIC_OR_DEPARTMENT_OTHER): Payer: BLUE CROSS/BLUE SHIELD

## 2016-02-29 DIAGNOSIS — B9562 Methicillin resistant Staphylococcus aureus infection as the cause of diseases classified elsewhere: Secondary | ICD-10-CM | POA: Diagnosis present

## 2016-02-29 DIAGNOSIS — R7881 Bacteremia: Secondary | ICD-10-CM

## 2016-02-29 LAB — ECHOCARDIOGRAM COMPLETE
AVLVOTPG: 5 mmHg
CHL CUP DOP CALC LVOT VTI: 21.2 cm
CHL CUP MV DEC (S): 468
CHL CUP STROKE VOLUME: 51 mL
E decel time: 468 msec
EERAT: 10.05
FS: 29 % (ref 28–44)
Height: 63 in
IV/PV OW: 0.95
LA ID, A-P, ES: 33 mm
LA diam index: 1.55 cm/m2
LA vol index: 25.7 mL/m2
LA vol: 54.7 mL
LAVOLA4C: 47.5 mL
LEFT ATRIUM END SYS DIAM: 33 mm
LV E/e'average: 10.05
LV PW d: 12.8 mm — AB (ref 0.6–1.1)
LV SIMPSON'S DISK: 58
LV TDI E'LATERAL: 8.4
LV TDI E'MEDIAL: 3.08
LV dias vol index: 41 mL/m2
LV dias vol: 88 mL (ref 46–106)
LV e' LATERAL: 8.4 cm/s
LV sys vol index: 18 mL/m2
LV sys vol: 37 mL (ref 14–42)
LVEEMED: 10.05
LVOT SV: 60 mL
LVOT area: 2.84 cm2
LVOT peak vel: 109 cm/s
LVOTD: 19 mm
Lateral S' vel: 12.3 cm/s
MVPG: 3 mmHg
MVPKAVEL: 129 m/s
MVPKEVEL: 84.4 m/s
RV sys press: 42 mmHg
Reg peak vel: 312 cm/s
TAPSE: 23.5 mm
TRMAXVEL: 312 cm/s
Weight: 3435.65 oz

## 2016-02-29 LAB — BASIC METABOLIC PANEL
Anion gap: 6 (ref 5–15)
Anion gap: 8 (ref 5–15)
BUN: 44 mg/dL — ABNORMAL HIGH (ref 6–20)
BUN: 49 mg/dL — AB (ref 6–20)
CHLORIDE: 105 mmol/L (ref 101–111)
CO2: 23 mmol/L (ref 22–32)
CO2: 17 mmol/L — ABNORMAL LOW (ref 22–32)
CREATININE: 2.08 mg/dL — AB (ref 0.44–1.00)
CREATININE: 2.43 mg/dL — AB (ref 0.44–1.00)
Calcium: 8 mg/dL — ABNORMAL LOW (ref 8.9–10.3)
Calcium: 8.3 mg/dL — ABNORMAL LOW (ref 8.9–10.3)
Chloride: 107 mmol/L (ref 101–111)
GFR calc Af Amer: 24 mL/min — ABNORMAL LOW (ref >60)
GFR, EST AFRICAN AMERICAN: 29 mL/min — AB (ref >60)
GFR, EST NON AFRICAN AMERICAN: 25 mL/min — AB (ref >60)
GFR, EST NON AFRICAN AMERICAN: 21 mL/min — AB (ref >60)
GLUCOSE: 42 mg/dL — AB (ref 65–99)
Glucose, Bld: 91 mg/dL (ref 65–99)
POTASSIUM: 4.2 mmol/L (ref 3.5–5.1)
Potassium: 5.5 mmol/L — ABNORMAL HIGH (ref 3.5–5.1)
SODIUM: 134 mmol/L — AB (ref 135–145)
SODIUM: 132 mmol/L — AB (ref 135–145)

## 2016-02-29 LAB — GLUCOSE, CAPILLARY
GLUCOSE-CAPILLARY: 105 mg/dL — AB (ref 65–99)
GLUCOSE-CAPILLARY: 126 mg/dL — AB (ref 65–99)
GLUCOSE-CAPILLARY: 133 mg/dL — AB (ref 65–99)
GLUCOSE-CAPILLARY: 67 mg/dL (ref 65–99)
Glucose-Capillary: 55 mg/dL — ABNORMAL LOW (ref 65–99)
Glucose-Capillary: 103 mg/dL — ABNORMAL HIGH (ref 65–99)
Glucose-Capillary: 99 mg/dL (ref 65–99)
Glucose-Capillary: 123 mg/dL — ABNORMAL HIGH (ref 65–99)
Glucose-Capillary: 44 mg/dL — CL (ref 65–99)
Glucose-Capillary: 84 mg/dL (ref 65–99)

## 2016-02-29 LAB — CBC
HCT: 33.2 % — ABNORMAL LOW (ref 36.0–46.0)
HEMOGLOBIN: 11 g/dL — AB (ref 12.0–15.0)
MCH: 29.3 pg (ref 26.0–34.0)
MCHC: 33.1 g/dL (ref 30.0–36.0)
MCV: 88.3 fL (ref 78.0–100.0)
PLATELETS: 311 10*3/uL (ref 150–400)
RBC: 3.76 MIL/uL — AB (ref 3.87–5.11)
RDW: 14.8 % (ref 11.5–15.5)
WBC: 20.4 10*3/uL — ABNORMAL HIGH (ref 4.0–10.5)

## 2016-02-29 LAB — MAGNESIUM: MAGNESIUM: 1.5 mg/dL — AB (ref 1.7–2.4)

## 2016-02-29 LAB — HEMOGLOBIN A1C
Hgb A1c MFr Bld: 11.9 % — ABNORMAL HIGH (ref 4.8–5.6)
MEAN PLASMA GLUCOSE: 295 mg/dL

## 2016-02-29 LAB — IGG: IGG (IMMUNOGLOBIN G), SERUM: 1926 mg/dL — AB (ref 700–1600)

## 2016-02-29 MED ORDER — MUPIROCIN 2 % EX OINT
TOPICAL_OINTMENT | Freq: Three times a day (TID) | CUTANEOUS | Status: DC
  Administered 2016-02-29 (×2): via TOPICAL
  Administered 2016-03-01: 1 via TOPICAL
  Administered 2016-03-01: 21:00:00 via TOPICAL
  Administered 2016-03-01 – 2016-03-02 (×2): 1 via TOPICAL
  Administered 2016-03-02 – 2016-03-05 (×9): via TOPICAL
  Filled 2016-02-29 (×3): qty 22

## 2016-02-29 MED ORDER — DEXTROSE 50 % IV SOLN
INTRAVENOUS | Status: AC
  Administered 2016-02-29: 50 mL
  Filled 2016-02-29: qty 50

## 2016-02-29 MED ORDER — INSULIN ASPART 100 UNIT/ML ~~LOC~~ SOLN: 0.0000 [IU] | Freq: Every day | SUBCUTANEOUS | Status: DC

## 2016-02-29 MED ORDER — DEXTROSE 50 % IV SOLN: 1.0000 | INTRAVENOUS | Status: AC

## 2016-02-29 MED ORDER — INSULIN ASPART 100 UNIT/ML ~~LOC~~ SOLN
0.0000 [IU] | Freq: Three times a day (TID) | SUBCUTANEOUS | Status: DC
  Administered 2016-03-01: 2 [IU] via SUBCUTANEOUS
  Administered 2016-03-02: 1 [IU] via SUBCUTANEOUS
  Administered 2016-03-02 – 2016-03-03 (×3): 2 [IU] via SUBCUTANEOUS
  Administered 2016-03-04: 1 [IU] via SUBCUTANEOUS
  Administered 2016-03-04: 2 [IU] via SUBCUTANEOUS
  Administered 2016-03-05: 1 [IU] via SUBCUTANEOUS

## 2016-02-29 MED ORDER — FUROSEMIDE 10 MG/ML IJ SOLN
40.0000 mg | Freq: Once | INTRAMUSCULAR | Status: AC
  Administered 2016-02-29: 40 mg via INTRAVENOUS
  Filled 2016-02-29: qty 4

## 2016-02-29 NOTE — Progress Notes (Signed)
*  PRELIMINARY RESULTS* Echocardiogram 2D Echocardiogram has been performed.  Stacey DrainWhite, Chrystel Barefield J 02/29/2016, 4:13 PM

## 2016-02-29 NOTE — Care Management Note (Signed)
Case Management Note  Patient Details  Name: [name] MRN: [id number] Date of Birth: Jan 15, 1956  Pt much more alert this morning. She states she lives with her brother and boyfriend. [facility]. She has multiple wounds that will require extensive care and IV abx for extended period of time. Pt's boyfriend has been caring for wounds prior to admission but this is not ideal as pt has had multiple admission in the past year related to these wounds. Pt is agreeable to SNF placement for IV abx and wound therapy. CSW is aware, unsure at this time if [employer] [employer] will approve SNF placement. If they will not pt understands she will have to DC home with IV abx therapy.   Expected Discharge Date:    03/04/2016              Expected Discharge Plan:  Home w Home Health Services (vs SNF)  In-House Referral:  NA  Discharge planning Services  CM Consult  Post Acute Care Choice:  Home Health Choice offered to:  Patient  DME Arranged:    DME Agency:     HH Arranged:    HH Agency:     Status of Service:  In process, will continue to follow  If discussed at Long Length of Stay Meetings, dates discussed:    Additional Comments:  Malcolm MetroChildress, Millissa Deese Demske, RN 02/29/2016, 10:03 AM

## 2016-02-29 NOTE — Progress Notes (Addendum)
PROGRESS NOTE    [name]  ZOX:[id number] DOB: [birth year] DOA: 02/27/2016 PCP: [name], MD    Brief Narrative:  68 yof with a hx of HTN, HLD, DM type 2, and CKD stage 3 presented with complaints of worsening back and chest abscesses. While in the ED she was noted to be hyponatremic and hyperglycemic. She had an elevated BUN 45, Cr 2.33, and WBC 15,100. She was started on IV vancomycin and Meropenem. She was admitted for further evaluation.   Assessment & Plan:   Principal Problem:   Abscess of skin and subcutaneous tissue Active Problems:   Hypertension   HLD (hyperlipidemia)   CKD (chronic kidney disease), stage III   Paroxysmal SVT (supraventricular tachycardia) (HCC)   Hyperglycemia   Hyperosmolarity syndrome   Pressure injury of skin  1. Multiple Abcesses of skin and subcutaneous tissue. Blood cultures positive for MRSA. Continue IV Vancomycin. General surgery has been consulted and feels she has no indication of surgical intervention at this time. continue warm compresses and mupirocin. Continue to monitor.  2. MRSA bacteremia. Related to skin infection. Currently on vancomycin. Discussed with Dr. Ninetta Lights and recommendations were to check 2D echo and repeat cultures this morning. If Echo is unremarkable, then she will need 2 weeks of vancomycin. 3. Hyperosmolarity syndrome. Blood sugars were markedly elevated on admission, but significantly improved with insulin infusion. She was transitioned to lantus and has been hypoglycemic since yesterday. She has been started on dextrose infusion. Sliding scale has been changed to sensitive. Will try and discontinue dextrose infusion and monitor blood sugars today.   4. DM type 2. Recent A1c is 8.5. Blood sugars likely elevated on admission due to infection/recent steroid use. Continue to monitor. 5. AKI. Creatinine was normal on last admission. Likely related to dehydration and volume depletion. continue IV fluids. Monitor  urine output. Continue to hold ACE I.  6.  Hyponatremia. Likely has some element of pseudohyponatremia in the setting of hyperglycemia. Also likely has some volume depletion. Improving with IV fluids. 7. HTN. Stable. Continue lopressor 8. HLD. Continue statins 9. GERD. Continue PPI   DVT prophylaxis: Heparin  Code Status: Full  Family Communication: No family bedside Disposition Plan: may need SNF placement for IV abx and wound care.    Consultants:   General surgery   Procedures:   None   Antimicrobials:   Vancomycin 9/20 >>    Subjective: Feeling better today. Has some superficial pain in her back where infections are located  Objective: Vitals:   02/28/16 2133 02/29/16 0000 02/29/16 0400 02/29/16 0500  BP: 119/61 (!) 141/69 135/63   Pulse:  70 78   Resp:  16 18   Temp:  97 F (36.1 C) 97.5 F (36.4 C)   TempSrc:  Axillary Axillary   SpO2:  97% 99%   Weight:    97.4 kg (214 lb 11.7 oz)  Height:        Intake/Output Summary (Last 24 hours) at 02/29/16 0624 Last data filed at 02/29/16 0500  Gross per 24 hour  Intake          2760.45 ml  Output                0 ml  Net          2760.45 ml   Filed Weights   02/27/16 2207 02/28/16 0500 02/29/16 0500  Weight: 93.1 kg (205 lb 4 oz) 93.1 kg (205 lb 4 oz) 97.4 kg (214 lb 11.7 oz)  Examination:  General exam: Appears calm and comfortable  Respiratory system: Clear to auscultation. Respiratory effort normal. Cardiovascular system: S1 & S2 heard, RRR. No JVD, murmurs, rubs, gallops or clicks. No pedal edema. Gastrointestinal system: Abdomen is nondistended, soft and nontender. No organomegaly or masses felt. Normal bowel sounds heard. Central nervous system: Alert and oriented. No focal neurological deficits. Extremities: Symmetric 5 x 5 power. Skin: multiple areas of superficial pustular lesions over entire back, right shoulder, groin, left flank. Multiple areas are draining purulent drainage that is foul  smelling Psychiatry: Judgement and insight appear normal. Mood & affect appropriate.     Data Reviewed: I have personally reviewed following labs and imaging studies  CBC:  Recent Labs Lab 02/27/16 1650 02/27/16 1721 02/28/16 0330 02/29/16 0430  WBC 15.4*  --  15.1* 20.4*  NEUTROABS 14.2*  --   --   --   HGB 10.8* 12.9 10.3* 11.0*  HCT 33.9* 38.0 31.0* 33.2*  MCV 92.6  --  88.6 88.3  PLT 345  --  325 311   Basic Metabolic Panel:  Recent Labs Lab 02/28/16 0330 02/28/16 0712 02/28/16 1442 02/28/16 2241 02/29/16 0430  NA 132* 134* 131* 134* 134*  K 3.5 3.0* 3.6 3.9 4.2  CL 102 102 105 108 105  CO2 24 26 24 22 23   GLUCOSE 520* 298* 44* 82 91  BUN 45* 44* 43* 44* 44*  CREATININE 2.33* 2.16* 2.22* 2.13* 2.08*  CALCIUM 7.9* 8.1* 7.4* 7.9* 8.0*  MG  --   --   --   --  1.5*   GFR: Estimated Creatinine Clearance: 32 mL/min (by C-G formula based on SCr of 2.08 mg/dL (H)). Liver Function Tests:  Recent Labs Lab 02/27/16 1650  AST 15  ALT 16  ALKPHOS 160*  BILITOT 0.6  PROT 6.9  ALBUMIN 1.9*   No results for input(s): LIPASE, AMYLASE in the last 168 hours. No results for input(s): AMMONIA in the last 168 hours. Coagulation Profile:  Recent Labs Lab 02/27/16 1650  INR 1.16   Cardiac Enzymes: No results for input(s): CKTOTAL, CKMB, CKMBINDEX, TROPONINI in the last 168 hours. BNP (last 3 results) No results for input(s): PROBNP in the last 8760 hours. HbA1C:  Recent Labs  02/27/16 2022  HGBA1C 11.9*   CBG:  Recent Labs Lab 02/29/16 0005 02/29/16 0113 02/29/16 0152 02/29/16 0415 02/29/16 0600  GLUCAP 67 55* 126* 133* 105*   Lipid Profile: No results for input(s): CHOL, HDL, LDLCALC, TRIG, CHOLHDL, LDLDIRECT in the last 72 hours. Thyroid Function Tests: No results for input(s): TSH, T4TOTAL, FREET4, T3FREE, THYROIDAB in the last 72 hours. Anemia Panel: No results for input(s): VITAMINB12, FOLATE, FERRITIN, TIBC, IRON, RETICCTPCT in the last 72  hours. Sepsis Labs:  Recent Labs Lab 02/27/16 1720 02/27/16 2022  LATICACIDVEN 2.0* 3.5*    Recent Results (from the past 240 hour(s))  Blood culture (routine x 2)     Status: None (Preliminary result)   Collection Time: 02/27/16  5:06 PM  Result Value Ref Range Status   Specimen Description BLOOD LEFT ANTECUBITAL  Final   Special Requests BOTTLES DRAWN AEROBIC AND ANAEROBIC 10CC EACH  Final   Culture  Setup Time   Final    GRAM POSITIVE COCCI IN CLUSTERS RECOVERED FROM BOTH THE AEROBIC AND ANAEROBIC BOTTLES Gram Stain Report Called to,Read Back By and Verified With: SCHONEWITZ,L. AT 0909  ON 02/28/2016 BY EVA Performed at [facility] Organism ID to follow Performed at [facility]  Culture PENDING  Incomplete   Report Status PENDING  Incomplete  Blood Culture ID Panel (Reflexed)     Status: Abnormal   Collection Time: 02/27/16  5:06 PM  Result Value Ref Range Status   Enterococcus species NOT DETECTED NOT DETECTED Final   Listeria monocytogenes NOT DETECTED NOT DETECTED Final   Staphylococcus species DETECTED (A) NOT DETECTED Final    Comment: CRITICAL RESULT CALLED TO, READ BACK BY AND VERIFIED WITH: S. Hall Pharm.D. 14:25 02/28/16 (wilsonm)    Staphylococcus aureus DETECTED (A) NOT DETECTED Final    Comment: CRITICAL RESULT CALLED TO, READ BACK BY AND VERIFIED WITH: S. Hall Pharm.D. 14:25 02/28/16 (wilsonm)    Methicillin resistance DETECTED (A) NOT DETECTED Final    Comment: CRITICAL RESULT CALLED TO, READ BACK BY AND VERIFIED WITH: S. Hall Pharm.D. 14:25 02/28/16 (wilsonm)    Streptococcus species NOT DETECTED NOT DETECTED Final   Streptococcus agalactiae NOT DETECTED NOT DETECTED Final   Streptococcus pneumoniae NOT DETECTED NOT DETECTED Final   Streptococcus pyogenes NOT DETECTED NOT DETECTED Final   Acinetobacter baumannii NOT DETECTED NOT DETECTED Final   Enterobacteriaceae species NOT DETECTED NOT DETECTED Final   Enterobacter cloacae complex  NOT DETECTED NOT DETECTED Final   Escherichia coli NOT DETECTED NOT DETECTED Final   Klebsiella oxytoca NOT DETECTED NOT DETECTED Final   Klebsiella pneumoniae NOT DETECTED NOT DETECTED Final   Proteus species NOT DETECTED NOT DETECTED Final   Serratia marcescens NOT DETECTED NOT DETECTED Final   Haemophilus influenzae NOT DETECTED NOT DETECTED Final   Neisseria meningitidis NOT DETECTED NOT DETECTED Final   Pseudomonas aeruginosa NOT DETECTED NOT DETECTED Final   Candida albicans NOT DETECTED NOT DETECTED Final   Candida glabrata NOT DETECTED NOT DETECTED Final   Candida krusei NOT DETECTED NOT DETECTED Final   Candida parapsilosis NOT DETECTED NOT DETECTED Final   Candida tropicalis NOT DETECTED NOT DETECTED Final    Comment: Performed at [facility]  Blood culture (routine x 2)     Status: None (Preliminary result)   Collection Time: 02/27/16  5:13 PM  Result Value Ref Range Status   Specimen Description BLOOD RIGHT ANTECUBITAL  Final   Special Requests BOTTLES DRAWN AEROBIC AND ANAEROBIC 5CC EACH  Final   Culture  Setup Time   Final    GRAM POSITIVE COCCI IN CLUSTERS RECOVERED FROM BOTH AEROBIC AND ANAEROEBIC BOTTLE Gram Stain Report Called to,Read Back By and Verified With: SCHONEWITZ, L. AT 0909 ON 02/28/2016 BY EVA    Culture PENDING  Incomplete   Report Status PENDING  Incomplete  MRSA PCR Screening     Status: Abnormal   Collection Time: 02/27/16 10:00 PM  Result Value Ref Range Status   MRSA by PCR POSITIVE (A) NEGATIVE Final    Comment:        The GeneXpert MRSA Assay (FDA approved for NASAL specimens only), is one component of a comprehensive MRSA colonization surveillance program. It is not intended to diagnose MRSA infection nor to guide or monitor treatment for MRSA infections. RESULT CALLED TO, READ BACK BY AND VERIFIED WITH: SPANGLER,E. AT 1030 ON 02/28/2016 BY BAUGHAM,M.          Radiology Studies: Ct Abdomen Pelvis Wo Contrast  Result  Date: 02/27/2016 CLINICAL DATA:  Multiple painful abscesses throughout the body. EXAM: CT ABDOMEN AND PELVIS WITHOUT CONTRAST TECHNIQUE: Multidetector CT imaging of the abdomen and pelvis was performed following the standard protocol without IV contrast. COMPARISON:  02/17/2016 FINDINGS: Hepatobiliary: Liver appears  unremarkable without contrast. Pancreas: Negative Spleen: Normal Adrenals/Urinary Tract: Adrenal glands are normal. Renal margins appear slightly indistinct which could go along with nephritis. No evidence of mass, abscess or obstruction. Stomach/Bowel: No primary bowel pathology. Ventral hernia which contains fat and a segment of transverse colon. Vascular/Lymphatic: Aortic atherosclerosis. No aneurysm. IVC is normal. No retroperitoneal mass or lymphadenopathy. Reproductive: Uterus and adnexal regions appear unremarkable. Other: No ascites or free air. Musculoskeletal: Ordinary degenerative changes affect the lumbar spine. There are areas of edema within the subcutaneous fat of the abdomen which could be nonspecific edema or areas of inflammation. IMPRESSION: Indistinct renal margins which could go along with nephritis. No evidence of abscess or obstruction. Ventral hernia containing fat and a segment of transverse colon. No obstruction. Edema pattern within the subcutaneous tissues which could be due to simple edema or inflammatory edema. Electronically Signed   By: Paulina Fusi M.D.   On: 02/27/2016 19:26   Ct Head Wo Contrast  Result Date: 02/27/2016 CLINICAL DATA:  Painful abscesses of the back and neck. Elevated creatinine. EXAM: CT HEAD WITHOUT CONTRAST TECHNIQUE: Contiguous axial images were obtained from the base of the skull through the vertex without intravenous contrast. COMPARISON:  None. FINDINGS: Brain: Normal appearance of the brain without evidence of old or acute infarction, mass lesion, hemorrhage, hydrocephalus or extra-axial collection. Vascular: There is atherosclerotic  calcification of the major vessels at the base of the brain. Skull: Normal Sinuses/Orbits: Normal Other: Few areas of swelling of the scalp which could be infectious inflammation. No large or obviously drainable scalp collection. IMPRESSION: Multiple areas of density in the scalp consistent with foci of soft tissue inflammation. None of these appear large or definitely low-density to suggest drainable collection. No intracranial abnormality. Electronically Signed   By: Paulina Fusi M.D.   On: 02/27/2016 19:17   Ct Chest Wo Contrast  Result Date: 02/27/2016 CLINICAL DATA:  Multiple painful abscesses of the back and neck. Pneumonia on the previous chest radiograph. EXAM: CT CHEST WITHOUT CONTRAST TECHNIQUE: Multidetector CT imaging of the chest was performed following the standard protocol without IV contrast. COMPARISON:  Chest radiography 02/18/2016. FINDINGS: Cardiovascular: Coronary artery calcification. Aortic atherosclerosis without evidence of aneurysm. Mediastinum/Nodes: No mediastinal mass or lymphadenopathy discernible without contrast. Lungs/Pleura: Bilateral pleural effusions layering dependently Coup left more than right. Mild dependent atelectasis in the right lung. Some patchy atelectasis or scarring anteriorly in the right upper lobe. More considerable density in the left lower lobe that could be a combination of volume loss and left lower lobe pneumonia. Upper Abdomen: See results of abdominal scan. Musculoskeletal: Thoracic spine is negative. There are multiple areas of abnormal soft tissue density within the subcutaneous fat of the thorax consistent with soft tissue infection as described clinically. Largest area is in the left lower lateral chest. IMPRESSION: Small effusions left more than right. Areas of pulmonary atelectasis left more than right. Possibility exists that there could be some patchy pneumonia, particularly in the left lower lobe. Aortic atherosclerosis.  Coronary artery  atherosclerosis. Multiple areas of soft tissue density within the subcutaneous fat of the chest consistent with multiple superficial soft tissue infections as described clinically. Electronically Signed   By: Paulina Fusi M.D.   On: 02/27/2016 19:21        Scheduled Meds: . aspirin EC  325 mg Oral Daily  . Chlorhexidine Gluconate Cloth  6 each Topical Q0600  . collagenase   Topical Daily  . FLUoxetine  20 mg Oral BID  . heparin  5,000 Units Subcutaneous Q8H  . Influenza vac split quadrivalent PF  0.5 mL Intramuscular Tomorrow-1000  . insulin aspart  0-20 Units Subcutaneous TID WC  . insulin aspart  0-5 Units Subcutaneous QHS  . insulin glargine  18 Units Subcutaneous BID  . metoprolol tartrate  25 mg Oral BID  . mupirocin ointment  1 application Nasal BID  . pantoprazole  40 mg Oral Daily  . pravastatin  40 mg Oral QHS  . sodium chloride flush  3 mL Intravenous Q12H  . vancomycin  1,000 mg Intravenous Q24H   Continuous Infusions: . sodium chloride 50 mL (02/29/16 0040)  . dextrose 50 mL/hr (02/29/16 0040)     LOS: 2 days    Time spent: 25 minutes    Erick Blinks, MD [facility] If 7PM-7AM, please contact night-coverage [email or web address] Password [facility] 02/29/2016, 6:24 AM

## 2016-02-29 NOTE — Clinical Social Work Note (Signed)
Clinical Social Work Assessment  Patient Details  Name: [name] MRN: [id number] Date of Birth: [birth year]  Date of referral:  02/29/16               Reason for consult:  Facility Placement                Permission sought to share information with:    Permission granted to share information::     Name::        Agency::     Relationship::     Contact Information:     Housing/Transportation Living arrangements for the past 2 months:  Single Family Home Source of Information:  Patient Patient Interpreter Needed:  None Criminal Activity/Legal Involvement Pertinent to Current Situation/Hospitalization:  No - Comment as needed Significant Relationships:  Adult Children, Siblings, Significant Other Lives with:  Siblings, Significant Other Do you feel safe going back to the place where you live?  Yes Need for family participation in patient care:  Yes (Comment)  Care giving concerns:  Patient did not identify care giving concerns.    Social Worker assessment / plan:  Patient advised that she lives in the home with her friend, Creta LevinMike Foster, and her brother.  She stated that at baseline she is independent.  CSW discussed discharge plan.  Patient was agreeable to go to SNF. CSW discussed that if insurance does not authorize patient [facility] or private [facility]. Patient verbalized that she understood.   Employment status:  Disabled (Comment on whether or not currently receiving Disability) Insurance information:  Managed Medicare PT Recommendations:  Not assessed at this time Information / Referral to community resources:  Skilled Nursing Facility  Patient/Family's Response to care: Patient is agreeable to go to SNF.   Emotional Response to Diagnosis, Current Treatment, and Prognosis:  Patient is understanding of her diagnosis, treatment and prognosis.   Emotional Assessment Appearance:  Appears stated age Attitude/Demeanor/Rapport:   (Cooperative) Affect  (typically observed):  Accepting, Calm (Calm) Orientation:  Oriented to Self, Oriented to Place, Oriented to  Time Alcohol / Substance use:  Not Applicable Psych involvement (Current and /or in the community):  No (Comment)  Discharge Needs  Concerns to be addressed:  Discharge Planning Concerns Readmission within the last 30 days:  No Current discharge risk:  None Barriers to Discharge:  Insurance Authorization   Annice NeedySettle, Frona Yost D, LCSW 02/29/2016, 1:59 PM

## 2016-02-29 NOTE — Clinical Social Work Placement (Signed)
   CLINICAL SOCIAL WORK PLACEMENT  NOTE  Date:  02/29/2016  Patient Details  Name: Sophia EllisonGenevieve A Davidson MRN: [id number] Date of Birth: 1956/05/30  Clinical Social Work is seeking post-discharge placement for this patient at the Skilled  Nursing Facility level of care (*CSW will initial, date and re-position this form in  chart as items are completed):  Yes   Patient/family provided with [facility] Clinical Social Work Department's list of facilities offering this level of care within the geographic area requested by the patient (or if unable, by the patient's family).  Yes   Patient/family informed of their freedom to choose among providers that offer the needed level of care, that participate in Medicare, Medicaid or managed care program needed by the patient, have an available bed and are willing to accept the patient.  Yes   Patient/family informed of [facility]'s ownership interest in [facility] and [facility], as well as of the fact that they are under no obligation to receive care at these facilities.  PASRR submitted to EDS on       PASRR number received on       Existing PASRR number confirmed on 02/29/16     FL2 transmitted to all facilities in geographic area requested by pt/family on 02/29/16     FL2 transmitted to all facilities within larger geographic area on       Patient informed that his/her managed care company has contracts with or will negotiate with certain facilities, including the following:            Patient/family informed of bed offers received.  Patient chooses bed at       Physician recommends and patient chooses bed at      Patient to be transferred to   on  .  Patient to be transferred to facility by       Patient family notified on   of transfer.  Name of family member notified:        PHYSICIAN       Additional Comment:    _______________________________________________ Annice NeedySettle, Kj Imbert D, LCSW 02/29/2016, 2:03 PM

## 2016-02-29 NOTE — Progress Notes (Addendum)
Hypoglycemic Event  CBG: 44  @ 2157  Treatment: 1 amp D50  Symptoms: none, patient asleep. Upon arousal no complaints    Follow-up CBG: Time:2218 CBG Result: 143  Possible Reasons for Event: Inadequate oral intake  Comments/MD notified: [facility]  notified at this time, D10 infusion to start 7425ml/hr.     Faylene MillionSmith, Nou Chard Allen

## 2016-02-29 NOTE — Progress Notes (Signed)
Hypoglycemic Event  CBG: 55  Treatment: 1amp D50 IVP  Symptoms: None  Follow-up CBG: Time:0145 CBG Result: 126  Possible Reasons for Event: Inadequate oral intake, am lantus dosage amount  Comments/MD notified: Hypoglycemic protocol followed, will continue to monitor the patient closely.     Faylene MillionSmith, Keiron Iodice Allen

## 2016-02-29 NOTE — Progress Notes (Signed)
Inpatient Diabetes Program Recommendations  AACE/ADA: New Consensus Statement on Inpatient Glycemic Control (2015)  Target Ranges:  Prepandial:   less than 140 mg/dL      Peak postprandial:   less than 180 mg/dL (1-2 hours)      Critically ill patients:  140 - 180 mg/dL   Results for [name], [name] (MRN [id number]) as of 02/29/2016 09:20  Ref. Range 02/28/2016 10:36 02/28/2016 11:35 02/28/2016 12:38 02/28/2016 16:28 02/28/2016 16:45 02/28/2016 17:04 02/28/2016 17:31 02/28/2016 17:59 02/28/2016 18:29 02/28/2016 19:47 02/28/2016 21:59 02/28/2016 22:19 02/29/2016 00:05 02/29/2016 01:13 02/29/2016 01:52 02/29/2016 04:15 02/29/2016 06:00 02/29/2016 07:52  Glucose-Capillary Latest Ref Range: 65 - 99 mg/dL 119178 (H) 147105 (H) 98 21 (LL) 19 (LL) 140 (H) 115 (H) 108 (H) 100 (H) 92 44 (LL) 143 (H) 67 55 (L) 126 (H) 133 (H) 105 (H) 103 (H)   Review of Glycemic Control  Diabetes history: DM2 Outpatient Diabetes medications: Lantus 18 units daily, Novolog 3 units TID with meals Current orders for Inpatient glycemic control: Lantus 18 units BID, Novolog 0-20 units TID with meals, Novolog 0-5 units QHS  Inpatient Diabetes Program Recommendations: Insulin - Basal: Patient received Lantus 40 units at 11:40 on 02/28/16 and patient has experienced several episodes of hypoglycemia. Please consider decreasing Lantus to Lantus 12 units QHS. Insulin-Correction: Please consider decreasing to Novolog sensitive scale.  Thanks, Orlando PennerMarie Pansie Guggisberg, RN, MSN, CDE Diabetes Coordinator Inpatient Diabetes Program [phone] (Team Pager from 8am to 5pm) [phone] ([facility] office) [phone] [facility] office) [phone] [facility] office)'

## 2016-02-29 NOTE — Clinical Social Work Placement (Addendum)
   CLINICAL SOCIAL WORK PLACEMENT  NOTE  Date:  02/29/2016  Patient Details  Name: [name] MRN: [id number] Date of Birth: 10-24-1955  Clinical Social Work is seeking post-discharge placement for this patient at the Skilled  Nursing Facility level of care (*CSW will initial, date and re-position this form in  chart as items are completed):  Yes   Patient/family provided with [facility] Clinical Social Work Department's list of facilities offering this level of care within the geographic area requested by the patient (or if unable, by the patient's family).  Yes   Patient/family informed of their freedom to choose among providers that offer the needed level of care, that participate in Medicare, Medicaid or managed care program needed by the patient, have an available bed and are willing to accept the patient.  Yes   Patient/family informed of [facility]'s ownership interest in [facility] and [facility], as well as of the fact that they are under no obligation to receive care at these facilities.  PASRR submitted to EDS on       PASRR number received on       Existing PASRR number confirmed on 02/29/16     FL2 transmitted to all facilities in geographic area requested by pt/family on 02/29/16     FL2 transmitted to all facilities within larger geographic area on       Patient informed that his/her managed care company has contracts with or will negotiate with certain facilities, including the following:        Yes   Patient/family informed of bed offers received.  [facility]     Physician recommends and patient chooses bed at      Patient [facility] on  .  Patient to be transferred to facility by       Patient family notified on   of transfer.  Name of family member notified:        PHYSICIAN       Additional Comment:   Facility will start authorization this  afternoon. _______________________________________________ Karn CassisStultz, [place] Athanas Shanaberger, LCSW 02/29/2016, 2:43 PM [phone]

## 2016-02-29 NOTE — NC FL2 (Signed)
[place] MEDICAID FL2 LEVEL OF CARE SCREENING TOOL     IDENTIFICATION  Patient Name: [name] Birthdate: 04/03/1956 Sex: female Admission Date (Current Location): 02/27/2016  [facility] [place]   Facility and Address:  [facility],  [address], [place] [id number]      Provider Number: [id number]  Attending Physician Name and Address:  Erick BlinksJehanzeb Memon, MD  Relative Name and Phone Number:       Current Level of Care: Hospital Recommended Level of Care: Skilled Nursing Facility Prior Approval Number:    Date Approved/Denied:   PASRR Number: [id number] ([id number])  Discharge Plan: SNF    Current Diagnoses: Patient Active Problem List   Diagnosis Date Noted  . MRSA bacteremia 02/29/2016  . Pressure injury of skin 02/28/2016  . Hyperglycemia 02/27/2016  . Abscess of skin and subcutaneous tissue 02/27/2016  . Hyperosmolarity syndrome 02/27/2016  . h/o dialysis needs spring 2017 02/18/2016  . Paroxysmal SVT (supraventricular tachycardia) (HCC) 02/18/2016  . Emesis 02/18/2016  . Hypokalemia 02/17/2016  . Volume overload 02/02/2016  . Abdominal pain 02/01/2016  . CKD (chronic kidney disease), stage III 10/24/2015  . Abscess of axilla, right 10/23/2015  . HLD (hyperlipidemia) 10/12/2015  . Anemia of chronic disease 10/12/2015  . Depression 10/12/2015  . UTI (lower urinary tract infection) 09/12/2015  . Generalized exfoliative dermatitis 09/09/2015  . Normocytic anemia 09/09/2015  . Necrotizing fasciitis (HCC) of inner thigh 08/07/2015  . IDDM (insulin dependent diabetes mellitus) (HCC) 08/07/2015  . History of hypertension 08/07/2015  . Hypertension 08/07/2015    Orientation RESPIRATION BLADDER Height & Weight     Self, Time, Situation, Place  Normal Incontinent Weight: 214 lb 11.7 oz (97.4 kg) Height:  5\' 3"  (160 cm)  BEHAVIORAL SYMPTOMS/MOOD NEUROLOGICAL BOWEL NUTRITION STATUS      Continent Diet (Carb  Modified)  AMBULATORY STATUS COMMUNICATION OF NEEDS Skin   Limited Assist Verbally PU Stage and Appropriate Care, Other (Comment) (Stage II left heel; flank left abcess)                       Personal Care Assistance Level of Assistance  Feeding, Dressing, Bathing Bathing Assistance: Limited assistance Feeding assistance: Independent Dressing Assistance: Limited assistance     Functional Limitations Info  Sight, Speech, Hearing Sight Info: Adequate Hearing Info: Adequate Speech Info: Adequate    SPECIAL CARE FACTORS FREQUENCY                       Contractures Contractures Info: Present    Additional Factors Info  Code Status, Allergies, Psychotropic Code Status Info: Full Code Allergies Info: Other Penicillins Psychotropic Info: Prozac, Seroquel         Current Medications (02/29/2016):  This is the current hospital active medication list Current Facility-Administered Medications  Medication Dose Route Frequency Provider Last Rate Last Dose  . albuterol (PROVENTIL) (2.5 MG/3ML) 0.083% nebulizer solution 3 mL  3 mL Inhalation Q6H PRN Houston SirenPeter Le, MD      . aspirin EC tablet 325 mg  325 mg Oral Daily Houston SirenPeter Le, MD   325 mg at 02/29/16 0900  . Chlorhexidine Gluconate Cloth 2 % PADS 6 each  6 each Topical Q0600 Erick BlinksJehanzeb Memon, MD   6 each at 02/29/16 0600  . collagenase (SANTYL) ointment   Topical Daily Houston SirenPeter Le, MD      . FLUoxetine (PROZAC) capsule 20 mg  20 mg Oral BID Houston SirenPeter Le, MD  20 mg at 02/29/16 0900  . heparin injection 5,000 Units  5,000 Units Subcutaneous Q8H Houston Siren, MD   5,000 Units at 02/29/16 [id number]  . HYDROmorphone (DILAUDID) tablet 4 mg  4 mg Oral Q6H PRN Houston Siren, MD   4 mg at 02/29/16 0903  . Influenza vac split quadrivalent PF (FLUARIX) injection 0.5 mL  0.5 mL Intramuscular Tomorrow-1000 Houston Siren, MD      . insulin aspart (novoLOG) injection 0-5 Units  0-5 Units Subcutaneous QHS Erick Blinks, MD      . insulin aspart (novoLOG) injection 0-9  Units  0-9 Units Subcutaneous TID WC Erick Blinks, MD      . metoprolol tartrate (LOPRESSOR) tablet 25 mg  25 mg Oral BID Houston Siren, MD   25 mg at 02/29/16 0900  . mupirocin ointment (BACTROBAN) 2 % 1 application  1 application Nasal BID Erick Blinks, MD   1 application at 02/29/16 0900  . mupirocin ointment (BACTROBAN) 2 %   Topical TID Erick Blinks, MD      . ondansetron [facility]) tablet 4 mg  4 mg Oral Q6H PRN Houston Siren, MD       Or  . ondansetron [facility]) injection 4 mg  4 mg Intravenous Q6H PRN Houston Siren, MD      . pantoprazole (PROTONIX) EC tablet 40 mg  40 mg Oral Daily Houston Siren, MD   40 mg at 02/29/16 0900  . pravastatin (PRAVACHOL) tablet 40 mg  40 mg Oral QHS Houston Siren, MD   40 mg at 02/28/16 2134  . senna-docusate (Senokot-S) tablet 1 tablet  1 tablet Oral QHS PRN Houston Siren, MD      . sodium chloride flush (NS) 0.9 % injection 3 mL  3 mL Intravenous Q12H Houston Siren, MD   3 mL at 02/28/16 2134  . vancomycin (VANCOCIN) IVPB 1000 mg/200 mL premix  1,000 mg Intravenous Q24H Erick Blinks, MD   1,000 mg at 02/28/16 1700     Discharge Medications: Please see discharge summary for a list of discharge medications.  Relevant Imaging Results:  Relevant Lab Results:   Additional Information SS#[id number]  Annice Needy, LCSW

## 2016-02-29 NOTE — Progress Notes (Signed)
Hypoglycemic Event  CBG:44  Treatment: D50 IV 50 mL  Symptoms: None  Follow-up CBG: Time:1700 CBG Result:123  Possible Reasons for Event: Inadequate meal intake  Comments/MD notified:memon    Schonewitz, Candelaria StagersLeigh Anne

## 2016-02-29 NOTE — Progress Notes (Signed)
Hypoglycemic Event  CBG: 67  Treatment: Notified [facility] , D10 infusion increase from 25 to 850ml/hr  Symptoms: none  Will recheck blood sugar within one hour status post D10 IV  infusion increase. Will continue to monitor the patient closely.

## 2016-02-29 NOTE — Progress Notes (Signed)
Pharmacy Antibiotic Note  [name] is a 60 y.o. female admitted on 02/27/2016 with cellulitis.  Pharmacy has been consulted for VANCOMYCIN dosing.  Pt with progressively worsened back and chest abscesses.  Pt has reportedly been on dialysis in the past.  Blood cultures (+) > staph aureus, MRSA detected  Plan:  Continue Vancomycin 1000mg  IV q24h (renally adjusted) Check trough tomorrow before dose Monitor labs, renal fxn, progress and c/s  Height: 5\' 3"  (160 cm) Weight: 214 lb 11.7 oz (97.4 kg) IBW/kg (Calculated) : 52.4  Temp (24hrs), Avg:97.6 F (36.4 C), Min:97 F (36.1 C), Max:98.8 F (37.1 C)   Recent Labs Lab 02/27/16 1650 02/27/16 1720  02/27/16 2022 02/28/16 0330 02/28/16 0712 02/28/16 1442 02/28/16 2241 02/29/16 0430  WBC 15.4*  --   --   --  15.1*  --   --   --  20.4*  CREATININE 2.78*  --   < > 2.72* 2.33* 2.16* 2.22* 2.13* 2.08*  LATICACIDVEN  --  2.0*  --  3.5*  --   --   --   --   --   < > = values in this interval not displayed.  Estimated Creatinine Clearance: 32 mL/min (by C-G formula based on SCr of 2.08 mg/dL (H)).    Allergies  Allergen Reactions  . Other     Per pt some type of mycin "some antibiotic sent me into renal failure."  . Penicillins Rash    Tolerated Zosyn in 07/2015 Has patient had a PCN reaction causing immediate rash, facial/tongue/throat swelling, SOB or lightheadedness with hypotension: No Has patient had a PCN reaction causing severe rash involving mucus membranes or skin necrosis: No Has patient had a PCN reaction that required hospitalization No Has patient had a PCN reaction occurring within the last 10 years: No If all of the above answers are "NO", then may proceed with Cephalosporin use.    Antimicrobials this admission: Vancomycin 9/19 >>  Meropenem 9/19 >> 9/20  Dose adjustments this admission:  Recent Results (from the past 240 hour(s))  Blood culture (routine x 2)     Status: Abnormal (Preliminary result)    Collection Time: 02/27/16  5:06 PM  Result Value Ref Range Status   Specimen Description BLOOD LEFT ANTECUBITAL  Final   Special Requests BOTTLES DRAWN AEROBIC AND ANAEROBIC 10CC EACH  Final   Culture  Setup Time   Final    GRAM POSITIVE COCCI IN CLUSTERS RECOVERED FROM BOTH THE AEROBIC AND ANAEROBIC BOTTLES Gram Stain Report Called to,Read Back By and Verified With: SCHONEWITZ,L. AT 0909  ON 02/28/2016 BY EVA Performed at [facility]    Culture STAPHYLOCOCCUS AUREUS (A)  Final   Report Status PENDING  Incomplete  Blood Culture ID Panel (Reflexed)     Status: Abnormal   Collection Time: 02/27/16  5:06 PM  Result Value Ref Range Status   Enterococcus species NOT DETECTED NOT DETECTED Final   Listeria monocytogenes NOT DETECTED NOT DETECTED Final   Staphylococcus species DETECTED (A) NOT DETECTED Final    Comment: CRITICAL RESULT CALLED TO, READ BACK BY AND VERIFIED WITH: S. Styles Fambro Pharm.D. 14:25 02/28/16 (wilsonm)    Staphylococcus aureus DETECTED (A) NOT DETECTED Final    Comment: CRITICAL RESULT CALLED TO, READ BACK BY AND VERIFIED WITH: S. Lolah Coghlan Pharm.D. 14:25 02/28/16 (wilsonm)    Methicillin resistance DETECTED (A) NOT DETECTED Final    Comment: CRITICAL RESULT CALLED TO, READ BACK BY AND VERIFIED WITH: S. Drue Harr Pharm.D. 14:25 02/28/16 (  wilsonm)    Streptococcus species NOT DETECTED NOT DETECTED Final   Streptococcus agalactiae NOT DETECTED NOT DETECTED Final   Streptococcus pneumoniae NOT DETECTED NOT DETECTED Final   Streptococcus pyogenes NOT DETECTED NOT DETECTED Final   Acinetobacter baumannii NOT DETECTED NOT DETECTED Final   Enterobacteriaceae species NOT DETECTED NOT DETECTED Final   Enterobacter cloacae complex NOT DETECTED NOT DETECTED Final   Escherichia coli NOT DETECTED NOT DETECTED Final   Klebsiella oxytoca NOT DETECTED NOT DETECTED Final   Klebsiella pneumoniae NOT DETECTED NOT DETECTED Final   Proteus species NOT DETECTED NOT DETECTED Final   Serratia  marcescens NOT DETECTED NOT DETECTED Final   Haemophilus influenzae NOT DETECTED NOT DETECTED Final   Neisseria meningitidis NOT DETECTED NOT DETECTED Final   Pseudomonas aeruginosa NOT DETECTED NOT DETECTED Final   Candida albicans NOT DETECTED NOT DETECTED Final   Candida glabrata NOT DETECTED NOT DETECTED Final   Candida krusei NOT DETECTED NOT DETECTED Final   Candida parapsilosis NOT DETECTED NOT DETECTED Final   Candida tropicalis NOT DETECTED NOT DETECTED Final    Comment: Performed at [facility]  Blood culture (routine x 2)     Status: Abnormal (Preliminary result)   Collection Time: 02/27/16  5:13 PM  Result Value Ref Range Status   Specimen Description BLOOD RIGHT ANTECUBITAL  Final   Special Requests BOTTLES DRAWN AEROBIC AND ANAEROBIC 5CC EACH  Final   Culture  Setup Time   Final    GRAM POSITIVE COCCI IN CLUSTERS RECOVERED FROM BOTH AEROBIC AND ANAEROEBIC BOTTLE Gram Stain Report Called to,Read Back By and Verified With: SCHONEWITZ, L. AT 0909 ON 02/28/2016 BY EVA    Culture STAPHYLOCOCCUS AUREUS (A)  Final   Report Status PENDING  Incomplete  MRSA PCR Screening     Status: Abnormal   Collection Time: 02/27/16 10:00 PM  Result Value Ref Range Status   MRSA by PCR POSITIVE (A) NEGATIVE Final    Comment:        The GeneXpert MRSA Assay (FDA approved for NASAL specimens only), is one component of a comprehensive MRSA colonization surveillance program. It is not intended to diagnose MRSA infection nor to guide or monitor treatment for MRSA infections. RESULT CALLED TO, READ BACK BY AND VERIFIED WITH: SPANGLER,E. AT 1030 ON 02/28/2016 BY BAUGHAM,M.    Thank you for allowing pharmacy to be a part of this patient's care.  Valrie Hart A 02/29/2016 12:37 PM

## 2016-03-01 LAB — BASIC METABOLIC PANEL
Anion gap: 6 (ref 5–15)
BUN: 47 mg/dL — AB (ref 6–20)
CALCIUM: 8.2 mg/dL — AB (ref 8.9–10.3)
CHLORIDE: 103 mmol/L (ref 101–111)
CO2: 23 mmol/L (ref 22–32)
CREATININE: 2.26 mg/dL — AB (ref 0.44–1.00)
GFR calc non Af Amer: 22 mL/min — ABNORMAL LOW (ref >60)
GFR, EST AFRICAN AMERICAN: 26 mL/min — AB (ref >60)
Glucose, Bld: 117 mg/dL — ABNORMAL HIGH (ref 65–99)
Potassium: 4.3 mmol/L (ref 3.5–5.1)
SODIUM: 132 mmol/L — AB (ref 135–145)

## 2016-03-01 LAB — GLUCOSE, CAPILLARY
GLUCOSE-CAPILLARY: 107 mg/dL — AB (ref 65–99)
GLUCOSE-CAPILLARY: 109 mg/dL — AB (ref 65–99)
GLUCOSE-CAPILLARY: 123 mg/dL — AB (ref 65–99)
GLUCOSE-CAPILLARY: 151 mg/dL — AB (ref 65–99)
Glucose-Capillary: 118 mg/dL — ABNORMAL HIGH (ref 65–99)
Glucose-Capillary: 156 mg/dL — ABNORMAL HIGH (ref 65–99)

## 2016-03-01 LAB — CULTURE, BLOOD (ROUTINE X 2)

## 2016-03-01 LAB — CBC
HCT: 29.4 % — ABNORMAL LOW (ref 36.0–46.0)
Hemoglobin: 9.9 g/dL — ABNORMAL LOW (ref 12.0–15.0)
MCH: 29.6 pg (ref 26.0–34.0)
MCHC: 33.7 g/dL (ref 30.0–36.0)
MCV: 87.8 fL (ref 78.0–100.0)
PLATELETS: 301 10*3/uL (ref 150–400)
RBC: 3.35 MIL/uL — AB (ref 3.87–5.11)
RDW: 15.1 % (ref 11.5–15.5)
WBC: 12.2 10*3/uL — AB (ref 4.0–10.5)

## 2016-03-01 LAB — VANCOMYCIN, TROUGH: Vancomycin Tr: 24 ug/mL (ref 15–20)

## 2016-03-01 MED ORDER — SODIUM CHLORIDE 0.9 % IV SOLN
INTRAVENOUS | Status: DC
  Administered 2016-03-01 – 2016-03-04 (×6): via INTRAVENOUS

## 2016-03-01 MED ORDER — FUROSEMIDE 10 MG/ML IJ SOLN
40.0000 mg | Freq: Two times a day (BID) | INTRAMUSCULAR | Status: DC
  Administered 2016-03-01 – 2016-03-04 (×7): 40 mg via INTRAVENOUS
  Filled 2016-03-01 (×7): qty 4

## 2016-03-01 MED ORDER — VANCOMYCIN HCL IN DEXTROSE 750-5 MG/150ML-% IV SOLN
750.0000 mg | INTRAVENOUS | Status: DC
  Administered 2016-03-03 – 2016-03-05 (×3): 750 mg via INTRAVENOUS
  Filled 2016-03-01 (×4): qty 150

## 2016-03-01 NOTE — Progress Notes (Signed)
Inpatient Diabetes Program Recommendations  AACE/ADA: New Consensus Statement on Inpatient Glycemic Control (2015)  Target Ranges:  Prepandial:   less than 140 mg/dL      Peak postprandial:   less than 180 mg/dL (1-2 hours)      Critically ill patients:  140 - 180 mg/dL   Results for [name], Sophia A (MRN [id number]) as of 03/01/2016 13:36  Ref. Range 02/29/2016 00:05 02/29/2016 01:13 02/29/2016 01:52 02/29/2016 04:15 02/29/2016 06:00 02/29/2016 07:52 02/29/2016 11:20 02/29/2016 16:33 02/29/2016 16:59 02/29/2016 19:48  Glucose-Capillary Latest Ref Range: 65 - 99 mg/dL 67 55 (L) 401126 (H) 027133 (H) 105 (H) 103 (H) 99 44 (LL) 123 (H) 84   Results for [name], Sophia A (MRN [id number]) as of 03/01/2016 13:36  Ref. Range 03/01/2016 00:37 03/01/2016 03:55 03/01/2016 07:47 03/01/2016 11:34  Glucose-Capillary Latest Ref Range: 65 - 99 mg/dL 474107 (H) 259123 (H) 563109 (H) 118 (H)    Home DM Meds: Lantus 18 units QHS       Novolog 3 units tidwc  Current Insulin Orders: Novolog Sensitive Correction Scale/ SSI (0-9 units) TID AC + HS     -Note Patient received Lantus 40 units at 11:40 on 02/28/16 and patient has experienced several episodes of hypoglycemia.   -Lantus discontinued.  No Lantus given to patient since 09/20.    MD- If glucose levels become elevated, Please consider restarting a small portion of patient's home dose of Lantus:  Recommend Lantus 9 units QHS (50% home dose to start)     --Will follow patient during hospitalization--  Ambrose FinlandJeannine Johnston Jerline Linzy RN, MSN, CDE Diabetes Coordinator Inpatient Glycemic Control Team Team Pager: [phone] (8a-5p)

## 2016-03-01 NOTE — Clinical Social Work Note (Signed)
[facility] continues to seek authorization. Anticipate MD will remain in hospital through weekend per MD. Will continue to follow. Pt aware room at SNF is semi-private.  Derenda FennelKara Kerie Badger, LCSW [phone]

## 2016-03-01 NOTE — Care Management Note (Signed)
Case Management Note  Patient Details  Name: Barrington EllisonGenevieve A Trier MRN: [id number] Date of Birth: 08/11/1955   Expected Discharge Date:    03/05/2016              Expected Discharge Plan:  Skilled Nursing Facility  In-House Referral:  Clinical Social Work  Discharge planning Services  CM Consult  Post Acute Care Choice:  NA Choice offered to:  NA  DME Arranged:    DME Agency:     HH Arranged:    HH Agency:     Status of Service:  In process, will continue to follow  If discussed at Long Length of Stay Meetings, dates discussed:    Additional Comments: Pt agreeable to SNF placement at this time. CSW has made arrangements. BCBS authorization is pending. Pt will not be ready for DC over weekend. Anticipate DC to SNF early next week pending ins approval.   Malcolm Metrohildress, Alexyia Guarino Demske, RN 03/01/2016, 10:29 AM

## 2016-03-01 NOTE — Progress Notes (Signed)
CRITICAL VALUE ALERT  Critical value received:  Vancomycin Level - 24  Date of notification:  03/01/16  Time of notification:  1954  Critical value read back:Yes.    Nurse who received alert:  Zara Chessrystal Lucrecia Mcphearson, RN  MD notified (1st page):  M.Lynch (Mid-level) Time of first page:  1955  On Call Pharmacist: Clyde CanterburyBenny Pittman notified @ 2000  MD notified (2nd page):  Time of second page:  Responding MD:  M.Burnadette PeterLynch  Time MD responded:  2001

## 2016-03-01 NOTE — Progress Notes (Signed)
Transferred to room 318 in stable condition via w/c, reported to R. Mitzie NaEverett, Charity fundraiserN.

## 2016-03-01 NOTE — Progress Notes (Signed)
Pharmacy Antibiotic Note  [name] is a 60 y.o. female admitted on 02/27/2016 with cellulitis.  Pharmacy has been consulted for VANCOMYCIN dosing.  Pt with progressively worsened back and chest abscesses.  Pt has reportedly been on dialysis in the past.  Blood cultures (+) > staph aureus, MRSA detected  Vancomycin trough above goal 24 this evening. Vancomycin 1 GM IV given already this evening  Plan:  Change Vancomycin to 750 mg IV every 24 hours with next dose due 03/03/16 at 2 AM Next dose delayed due to elevated trough and Vancomycin 1 GM given this evening already. Vancomycin trough at steady state Monitor labs, renal fxn, progress and c/s  Height: 5\' 3"  (160 cm) Weight: 215 lb 6.2 oz (97.7 kg) IBW/kg (Calculated) : 52.4  Temp (24hrs), Avg:98.4 F (36.9 C), Min:98.1 F (36.7 C), Max:98.5 F (36.9 C)   Recent Labs Lab 02/27/16 1650 02/27/16 1720  02/27/16 2022 02/28/16 0330  02/28/16 1442 02/28/16 2241 02/29/16 0430 02/29/16 1638 03/01/16 0547 03/01/16 1821  WBC 15.4*  --   --   --  15.1*  --   --   --  20.4*  --  12.2*  --   CREATININE 2.78*  --   < > 2.72* 2.33*  < > 2.22* 2.13* 2.08* 2.43* 2.26*  --   LATICACIDVEN  --  2.0*  --  3.5*  --   --   --   --   --   --   --   --   VANCOTROUGH  --   --   --   --   --   --   --   --   --   --   --  24*  < > = values in this interval not displayed.  Estimated Creatinine Clearance: 29.5 mL/min (by C-G formula based on SCr of 2.26 mg/dL (H)).    Allergies  Allergen Reactions  . Other     Per pt some type of mycin "some antibiotic sent me into renal failure."  . Penicillins Rash    Tolerated Zosyn in 07/2015 Has patient had a PCN reaction causing immediate rash, facial/tongue/throat swelling, SOB or lightheadedness with hypotension: No Has patient had a PCN reaction causing severe rash involving mucus membranes or skin necrosis: No Has patient had a PCN reaction that required hospitalization No Has patient had a  PCN reaction occurring within the last 10 years: No If all of the above answers are "NO", then may proceed with Cephalosporin use.    Antimicrobials this admission: Vancomycin 9/19 >>  Meropenem 9/19 >> 9/20  Dose adjustments this admission: Vancomycin reduced to 750 mg every 24 hours starting 03/03/16 at 2 AM  Recent Results (from the past 240 hour(s))  Blood culture (routine x 2)     Status: Abnormal   Collection Time: 02/27/16  5:06 PM  Result Value Ref Range Status   Specimen Description BLOOD LEFT ANTECUBITAL  Final   Special Requests BOTTLES DRAWN AEROBIC AND ANAEROBIC 10CC EACH  Final   Culture  Setup Time   Final    GRAM POSITIVE COCCI IN CLUSTERS RECOVERED FROM BOTH THE AEROBIC AND ANAEROBIC BOTTLES Gram Stain Report Called to,Read Back By and Verified With: SCHONEWITZ,L. AT 0909  ON 02/28/2016 BY EVA Performed at [facility]    Culture METHICILLIN RESISTANT STAPHYLOCOCCUS AUREUS (A)  Final   Report Status 03/01/2016 FINAL  Final   Organism ID, Bacteria METHICILLIN RESISTANT STAPHYLOCOCCUS AUREUS  Final  Susceptibility   Methicillin resistant staphylococcus aureus - MIC*    CIPROFLOXACIN >=8 RESISTANT Resistant     ERYTHROMYCIN >=8 RESISTANT Resistant     GENTAMICIN <=0.5 SENSITIVE Sensitive     OXACILLIN >=4 RESISTANT Resistant     TETRACYCLINE <=1 SENSITIVE Sensitive     VANCOMYCIN <=0.5 SENSITIVE Sensitive     TRIMETH/SULFA <=10 SENSITIVE Sensitive     CLINDAMYCIN <=0.25 SENSITIVE Sensitive     RIFAMPIN <=0.5 SENSITIVE Sensitive     Inducible Clindamycin NEGATIVE Sensitive     * METHICILLIN RESISTANT STAPHYLOCOCCUS AUREUS  Blood Culture ID Panel (Reflexed)     Status: Abnormal   Collection Time: 02/27/16  5:06 PM  Result Value Ref Range Status   Enterococcus species NOT DETECTED NOT DETECTED Final   Listeria monocytogenes NOT DETECTED NOT DETECTED Final   Staphylococcus species DETECTED (A) NOT DETECTED Final    Comment: CRITICAL RESULT CALLED TO,  READ BACK BY AND VERIFIED WITH: S. Hall Pharm.D. 14:25 02/28/16 ([email or web address])    Staphylococcus aureus DETECTED (A) NOT DETECTED Final    Comment: CRITICAL RESULT CALLED TO, READ BACK BY AND VERIFIED WITH: S. Hall Pharm.D. 14:25 02/28/16 ([email or web address])    Methicillin resistance DETECTED (A) NOT DETECTED Final    Comment: CRITICAL RESULT CALLED TO, READ BACK BY AND VERIFIED WITH: S. Hall Pharm.D. 14:25 02/28/16 ([email or web address])    Streptococcus species NOT DETECTED NOT DETECTED Final   Streptococcus agalactiae NOT DETECTED NOT DETECTED Final   Streptococcus pneumoniae NOT DETECTED NOT DETECTED Final   Streptococcus pyogenes NOT DETECTED NOT DETECTED Final   Acinetobacter baumannii NOT DETECTED NOT DETECTED Final   Enterobacteriaceae species NOT DETECTED NOT DETECTED Final   Enterobacter cloacae complex NOT DETECTED NOT DETECTED Final   Escherichia coli NOT DETECTED NOT DETECTED Final   Klebsiella oxytoca NOT DETECTED NOT DETECTED Final   Klebsiella pneumoniae NOT DETECTED NOT DETECTED Final   Proteus species NOT DETECTED NOT DETECTED Final   Serratia marcescens NOT DETECTED NOT DETECTED Final   Haemophilus influenzae NOT DETECTED NOT DETECTED Final   Neisseria meningitidis NOT DETECTED NOT DETECTED Final   Pseudomonas aeruginosa NOT DETECTED NOT DETECTED Final   Candida albicans NOT DETECTED NOT DETECTED Final   Candida glabrata NOT DETECTED NOT DETECTED Final   Candida krusei NOT DETECTED NOT DETECTED Final   Candida parapsilosis NOT DETECTED NOT DETECTED Final   Candida tropicalis NOT DETECTED NOT DETECTED Final    Comment: Performed at [facility]  Blood culture (routine x 2)     Status: Abnormal   Collection Time: 02/27/16  5:13 PM  Result Value Ref Range Status   Specimen Description BLOOD RIGHT ANTECUBITAL  Final   Special Requests BOTTLES DRAWN AEROBIC AND ANAEROBIC 5CC EACH  Final   Culture  Setup Time   Final    GRAM POSITIVE COCCI IN CLUSTERS RECOVERED FROM BOTH AEROBIC AND  ANAEROEBIC BOTTLE Gram Stain Report Called to,Read Back By and Verified With: SCHONEWITZ, L. AT 0909 ON 02/28/2016 BY EVA    Culture (A)  Final    STAPHYLOCOCCUS AUREUS SUSCEPTIBILITIES PERFORMED ON PREVIOUS CULTURE WITHIN THE LAST 5 DAYS. Performed at [facility]    Report Status 03/01/2016 FINAL  Final  MRSA PCR Screening     Status: Abnormal   Collection Time: 02/27/16 10:00 PM  Result Value Ref Range Status   MRSA by PCR POSITIVE (A) NEGATIVE Final    Comment:        The GeneXpert MRSA Assay (FDA approved for NASAL  specimens only), is one component of a comprehensive MRSA colonization surveillance program. It is not intended to diagnose MRSA infection nor to guide or monitor treatment for MRSA infections. RESULT CALLED TO, READ BACK BY AND VERIFIED WITH: SPANGLER,E. AT 1030 ON 02/28/2016 BY BAUGHAM,M.   Culture, blood (Routine X 2) w Reflex to ID Panel     Status: None (Preliminary result)   Collection Time: 03/01/16  5:58 AM  Result Value Ref Range Status   Specimen Description BLOOD RIGHT HAND  Final   Special Requests BOTTLES DRAWN AEROBIC AND ANAEROBIC 4CC  Final   Culture PENDING  Incomplete   Report Status PENDING  Incomplete  Culture, blood (Routine X 2) w Reflex to ID Panel     Status: None (Preliminary result)   Collection Time: 03/01/16  5:58 AM  Result Value Ref Range Status   Specimen Description BLOOD RIGHT HAND  Final   Special Requests BOTTLES DRAWN AEROBIC AND ANAEROBIC 6CC  Final   Culture PENDING  Incomplete   Report Status PENDING  Incomplete   Thank you for allowing pharmacy to be a part of this patient's care.  Josephine Igo 03/01/2016 8:16 PM

## 2016-03-01 NOTE — Consult Note (Signed)
Reason for Consult: Acute kidney injury superimposed on chronic.  Referring Physician: Dr. Terisa [name] is an 60 y.o. female.  HPI: She is a patient who has history of hypertension, diabetes, history of diastolic dysfunction presently came with nausea, vomiting and epigastric discomfort. When she was evaluated she was found to have elevated BUN and creatinine hence consult is called. According to her previous record patient was admitted to [facility] with sever  fasciitis and found to have acute kidney injury most likely from ATN. During that time her creatinine has gone up to 18 and patient was put on dialysis. Since her renal function recovered before her discharged dialysis was discontinued and sent home to be followed as an outpatient. Presently patient says that she didn't go back to see any of the nephrologist. At this moment she seems to be feeling much better. She denies any nausea or vomiting. Patient also denies any difficulty breathing.  Past Medical History:  Diagnosis Date  . Anxiety   . Depression   . GERD (gastroesophageal reflux disease)   . High cholesterol   . Hypertension   . Type II diabetes mellitus ([name])     Past Surgical History:  Procedure Laterality Date  . APPLICATION OF WOUND VAC Right 10/25/2015   Procedure: APPLICATION OF WOUND VAC;  Surgeon: Sophia Signs, MD;  Location: [facility];  Service: General;  Laterality: Right;  . CESAREAN SECTION  1984  . INCISION AND DRAINAGE Right 08/09/2015   Procedure: INCISION AND DRAINAGE;  Surgeon: Sophia Bruce Kinsinger, MD;  Location: [facility];  Service: General;  Laterality: Right;  . INCISION AND DRAINAGE ABSCESS Right 08/08/2015   Procedure: Irrigation and Debridement of Right Lower Ext.;  Surgeon: Sophia Ok, MD;  Location: [place];  Service: General;  Laterality: Right;  . INCISION AND DRAINAGE ABSCESS Right 10/25/2015   Procedure: INCISION AND DRAINAGE ABSCESS RIGHT AXILLARY ABSCESS;  Surgeon: Sophia Signs, MD;  Location: [facility];  Service: General;  Laterality: Right;    History reviewed. No pertinent family history.  Social History:  reports that she has been smoking Cigarettes.  She has a 3.50 pack-year smoking history. She has never used smokeless tobacco. She reports that she drinks about 0.6 oz of alcohol per week . She reports that she does not use drugs.  Allergies:  Allergies  Allergen Reactions  . Other     Per pt some type of mycin "some antibiotic sent me into renal failure."  . Penicillins Rash    Tolerated Zosyn in 07/2015 Has patient had a PCN reaction causing immediate rash, facial/tongue/throat swelling, SOB or lightheadedness with hypotension: No Has patient had a PCN reaction causing severe rash involving mucus membranes or skin necrosis: No Has patient had a PCN reaction that required hospitalization No Has patient had a PCN reaction occurring within the last 10 years: No If all of the above answers are "NO", then may proceed with Cephalosporin use.     Medications: I have reviewed the patient'[name] current medications.  Results for orders placed or performed during the hospital encounter of 02/27/16 (from the past 48 hour([name]))  Glucose, capillary     Status: None   Collection Time: 02/28/16 12:38 PM  Result Value Ref Range   Glucose-Capillary 98 65 - 99 mg/dL  Basic metabolic panel     Status: Abnormal   Collection Time: 02/28/16  2:42 PM  Result Value Ref Range   Sodium 131 (L) 135 - 145 mmol/L  Potassium 3.6 3.5 - 5.1 mmol/L   Chloride 105 101 - 111 mmol/L   CO2 24 22 - 32 mmol/L   Glucose, Bld 44 (LL) 65 - 99 mg/dL    Comment: CRITICAL RESULT CALLED TO, READ BACK BY AND VERIFIED WITH: STHONEWITZ,L AT 1630 ON 02/28/2016 BY ISLEY,B    BUN 43 (H) 6 - 20 mg/dL   Creatinine, Ser 2.22 (H) 0.44 - 1.00 mg/dL   Calcium 7.4 (L) 8.9 - 10.3 mg/dL   GFR calc non Af Amer 23 (L) >60 mL/min   GFR calc Af Amer 27 (L) >60 mL/min    Comment: (NOTE) The eGFR has been  calculated using the CKD EPI equation. This calculation has not been validated in all clinical situations. eGFR'[name] persistently <60 mL/min signify possible Chronic Kidney Disease.    Anion gap 2 (L) 5 - 15  Glucose, capillary     Status: Abnormal   Collection Time: 02/28/16  4:28 PM  Result Value Ref Range   Glucose-Capillary 21 (LL) 65 - 99 mg/dL   Comment 1 Notify RN   Glucose, capillary     Status: Abnormal   Collection Time: 02/28/16  4:45 PM  Result Value Ref Range   Glucose-Capillary 19 (LL) 65 - 99 mg/dL   Comment 1 Notify RN   Glucose, capillary     Status: Abnormal   Collection Time: 02/28/16  5:04 PM  Result Value Ref Range   Glucose-Capillary 140 (H) 65 - 99 mg/dL  Glucose, capillary     Status: Abnormal   Collection Time: 02/28/16  5:31 PM  Result Value Ref Range   Glucose-Capillary 115 (H) 65 - 99 mg/dL  Glucose, capillary     Status: Abnormal   Collection Time: 02/28/16  5:59 PM  Result Value Ref Range   Glucose-Capillary 108 (H) 65 - 99 mg/dL  Glucose, capillary     Status: Abnormal   Collection Time: 02/28/16  6:29 PM  Result Value Ref Range   Glucose-Capillary 100 (H) 65 - 99 mg/dL  Glucose, capillary     Status: None   Collection Time: 02/28/16  7:47 PM  Result Value Ref Range   Glucose-Capillary 92 65 - 99 mg/dL   Comment 1 Notify RN   Glucose, capillary     Status: Abnormal   Collection Time: 02/28/16  9:59 PM  Result Value Ref Range   Glucose-Capillary 44 (LL) 65 - 99 mg/dL   Comment 1 Notify RN   Glucose, capillary     Status: Abnormal   Collection Time: 02/28/16 10:19 PM  Result Value Ref Range   Glucose-Capillary 143 (H) 65 - 99 mg/dL   Comment 1 Notify RN   Basic metabolic panel     Status: Abnormal   Collection Time: 02/28/16 10:41 PM  Result Value Ref Range   Sodium 134 (L) 135 - 145 mmol/L   Potassium 3.9 3.5 - 5.1 mmol/L   Chloride 108 101 - 111 mmol/L   CO2 22 22 - 32 mmol/L   Glucose, Bld 82 65 - 99 mg/dL   BUN 44 (H) 6 - 20 mg/dL    Creatinine, Ser 2.13 (H) 0.44 - 1.00 mg/dL   Calcium 7.9 (L) 8.9 - 10.3 mg/dL   GFR calc non Af Amer 24 (L) >60 mL/min   GFR calc Af Amer 28 (L) >60 mL/min    Comment: (NOTE) The eGFR has been calculated using the CKD EPI equation. This calculation has not been validated in all clinical situations. eGFR'[name]  persistently <60 mL/min signify possible Chronic Kidney Disease.    Anion gap 4 (L) 5 - 15  Glucose, capillary     Status: None   Collection Time: 02/29/16 12:05 AM  Result Value Ref Range   Glucose-Capillary 67 65 - 99 mg/dL   Comment 1 Notify RN   Glucose, capillary     Status: Abnormal   Collection Time: 02/29/16  1:13 AM  Result Value Ref Range   Glucose-Capillary 55 (L) 65 - 99 mg/dL   Comment 1 Notify RN   Glucose, capillary     Status: Abnormal   Collection Time: 02/29/16  1:52 AM  Result Value Ref Range   Glucose-Capillary 126 (H) 65 - 99 mg/dL   Comment 1 Notify RN   Glucose, capillary     Status: Abnormal   Collection Time: 02/29/16  4:15 AM  Result Value Ref Range   Glucose-Capillary 133 (H) 65 - 99 mg/dL   Comment 1 Notify RN   CBC     Status: Abnormal   Collection Time: 02/29/16  4:30 AM  Result Value Ref Range   WBC 20.4 (H) 4.0 - 10.5 K/uL   RBC 3.76 (L) 3.87 - 5.11 MIL/uL   Hemoglobin 11.0 (L) 12.0 - 15.0 g/dL   HCT 33.2 (L) 36.0 - 46.0 %   MCV 88.3 78.0 - 100.0 fL   MCH 29.3 26.0 - 34.0 pg   MCHC 33.1 30.0 - 36.0 g/dL   RDW 14.8 11.5 - 15.5 %   Platelets 311 150 - 400 K/uL  Basic metabolic panel     Status: Abnormal   Collection Time: 02/29/16  4:30 AM  Result Value Ref Range   Sodium 134 (L) 135 - 145 mmol/L   Potassium 4.2 3.5 - 5.1 mmol/L   Chloride 105 101 - 111 mmol/L   CO2 23 22 - 32 mmol/L   Glucose, Bld 91 65 - 99 mg/dL   BUN 44 (H) 6 - 20 mg/dL   Creatinine, Ser 2.08 (H) 0.44 - 1.00 mg/dL   Calcium 8.0 (L) 8.9 - 10.3 mg/dL   GFR calc non Af Amer 25 (L) >60 mL/min   GFR calc Af Amer 29 (L) >60 mL/min    Comment: (NOTE) The eGFR has  been calculated using the CKD EPI equation. This calculation has not been validated in all clinical situations. eGFR'[name] persistently <60 mL/min signify possible Chronic Kidney Disease.    Anion gap 6 5 - 15  Magnesium     Status: Abnormal   Collection Time: 02/29/16  4:30 AM  Result Value Ref Range   Magnesium 1.5 (L) 1.7 - 2.4 mg/dL  Glucose, capillary     Status: Abnormal   Collection Time: 02/29/16  6:00 AM  Result Value Ref Range   Glucose-Capillary 105 (H) 65 - 99 mg/dL  Glucose, capillary     Status: Abnormal   Collection Time: 02/29/16  7:52 AM  Result Value Ref Range   Glucose-Capillary 103 (H) 65 - 99 mg/dL  Glucose, capillary     Status: None   Collection Time: 02/29/16 11:20 AM  Result Value Ref Range   Glucose-Capillary 99 65 - 99 mg/dL  Glucose, capillary     Status: Abnormal   Collection Time: 02/29/16  4:33 PM  Result Value Ref Range   Glucose-Capillary 44 (LL) 65 - 99 mg/dL  Basic metabolic panel     Status: Abnormal   Collection Time: 02/29/16  4:38 PM  Result Value Ref Range  Sodium 132 (L) 135 - 145 mmol/L   Potassium 5.5 (H) 3.5 - 5.1 mmol/L    Comment: DELTA CHECK NOTED   Chloride 107 101 - 111 mmol/L   CO2 17 (L) 22 - 32 mmol/L   Glucose, Bld 42 (LL) 65 - 99 mg/dL    Comment: CRITICAL RESULT CALLED TO, READ BACK BY AND VERIFIED WITH: SCHONEWITZ,L AT 1730 ON 02/29/2016 BY ISLEY,B    BUN 49 (H) 6 - 20 mg/dL   Creatinine, Ser 2.43 (H) 0.44 - 1.00 mg/dL   Calcium 8.3 (L) 8.9 - 10.3 mg/dL   GFR calc non Af Amer 21 (L) >60 mL/min   GFR calc Af Amer 24 (L) >60 mL/min    Comment: (NOTE) The eGFR has been calculated using the CKD EPI equation. This calculation has not been validated in all clinical situations. eGFR'[name] persistently <60 mL/min signify possible Chronic Kidney Disease.    Anion gap 8 5 - 15  Glucose, capillary     Status: Abnormal   Collection Time: 02/29/16  4:59 PM  Result Value Ref Range   Glucose-Capillary 123 (H) 65 - 99 mg/dL   Glucose, capillary     Status: None   Collection Time: 02/29/16  7:48 PM  Result Value Ref Range   Glucose-Capillary 84 65 - 99 mg/dL   Comment 1 Notify RN   Glucose, capillary     Status: Abnormal   Collection Time: 03/01/16 12:37 AM  Result Value Ref Range   Glucose-Capillary 107 (H) 65 - 99 mg/dL   Comment 1 Notify RN   Glucose, capillary     Status: Abnormal   Collection Time: 03/01/16  3:55 AM  Result Value Ref Range   Glucose-Capillary 123 (H) 65 - 99 mg/dL   Comment 1 Notify RN   Basic metabolic panel     Status: Abnormal   Collection Time: 03/01/16  5:47 AM  Result Value Ref Range   Sodium 132 (L) 135 - 145 mmol/L   Potassium 4.3 3.5 - 5.1 mmol/L    Comment: DELTA CHECK NOTED   Chloride 103 101 - 111 mmol/L   CO2 23 22 - 32 mmol/L   Glucose, Bld 117 (H) 65 - 99 mg/dL   BUN 47 (H) 6 - 20 mg/dL   Creatinine, Ser 2.26 (H) 0.44 - 1.00 mg/dL   Calcium 8.2 (L) 8.9 - 10.3 mg/dL   GFR calc non Af Amer 22 (L) >60 mL/min   GFR calc Af Amer 26 (L) >60 mL/min    Comment: (NOTE) The eGFR has been calculated using the CKD EPI equation. This calculation has not been validated in all clinical situations. eGFR'[name] persistently <60 mL/min signify possible Chronic Kidney Disease.    Anion gap 6 5 - 15  CBC     Status: Abnormal   Collection Time: 03/01/16  5:47 AM  Result Value Ref Range   WBC 12.2 (H) 4.0 - 10.5 K/uL   RBC 3.35 (L) 3.87 - 5.11 MIL/uL   Hemoglobin 9.9 (L) 12.0 - 15.0 g/dL   HCT 29.4 (L) 36.0 - 46.0 %   MCV 87.8 78.0 - 100.0 fL   MCH 29.6 26.0 - 34.0 pg   MCHC 33.7 30.0 - 36.0 g/dL   RDW 15.1 11.5 - 15.5 %   Platelets 301 150 - 400 K/uL  Culture, blood (Routine X 2) w Reflex to ID Panel     Status: None (Preliminary result)   Collection Time: 03/01/16  5:58 AM  Result Value  Ref Range   Specimen Description BLOOD RIGHT HAND    Special Requests BOTTLES DRAWN AEROBIC AND ANAEROBIC 4CC    Culture PENDING    Report Status PENDING   Culture, blood (Routine X 2) w  Reflex to ID Panel     Status: None (Preliminary result)   Collection Time: 03/01/16  5:58 AM  Result Value Ref Range   Specimen Description BLOOD RIGHT HAND    Special Requests BOTTLES DRAWN AEROBIC AND ANAEROBIC 6CC    Culture PENDING    Report Status PENDING   Glucose, capillary     Status: Abnormal   Collection Time: 03/01/16  7:47 AM  Result Value Ref Range   Glucose-Capillary 109 (H) 65 - 99 mg/dL  Glucose, capillary     Status: Abnormal   Collection Time: 03/01/16 11:34 AM  Result Value Ref Range   Glucose-Capillary 118 (H) 65 - 99 mg/dL    No results found.  Review of Systems  Constitutional: Negative for chills and fever.  Respiratory: Negative for cough and shortness of breath.   Gastrointestinal: Positive for abdominal pain, nausea and vomiting.  Skin: Positive for rash.   Blood pressure 120/66, pulse 85, temperature 98.1 F (36.7 C), temperature source Oral, resp. rate 14, height 5' 3" (1.6 m), weight 97.7 kg (215 lb 6.2 oz), SpO2 92 %. Physical Exam  Constitutional: No distress.  Eyes: No scleral icterus.  Neck: No JVD present.  Cardiovascular: Normal rate and regular rhythm.   No murmur heard. Respiratory: No respiratory distress. She has no wheezes.  GI: She exhibits no distension. There is no tenderness.  Musculoskeletal: She exhibits no edema.    Assessment/Plan: Problem #1 possibly acute kidney injury: Patient creatinine was up to 18 on 09/09/15. Patient received a couple of dialysis. Cc her renal function has recovered dialysis was discontinued. Recently on 01/12/16 her creatinine was as low as 1. The present increasing BUN and creatinine could be secondary to prerenal syndrome / ATN/vancomycin associated acute kidney injury/ACE. Patient presently no known chronic and her creatinine is slightly better. Problem #2 hypertension: Her blood pressures is in a controlled Problem #3 history of diabetes Problem #4 history of paroxysmal SVT Problem #5 history of  diastolic dysfunction: Patient at this moment doesn't have any significant sign of fluid overload Problem #8 history of anemia Problem #7 history of depression Plan: We'll continue with IV fluid at 100 mL per hour We'll start patient on Lasix 40 mg IV twice a day We'll follow her renal panel in the morning.  [name] 03/01/2016, 11:51 AM

## 2016-03-01 NOTE — Progress Notes (Signed)
PROGRESS NOTE    [name]  EXB:[id number] DOB: 1956/04/19 DOA: 02/27/2016 PCP: Louie BostonAPPER,[name], MD    Brief Narrative:  [age] yof with a hx of HTN, HLD, DM type 2, and CKD stage 3 presented with complaints of worsening back and chest abscesses. While in the ED she was noted to be hyponatremic and hyperglycemic. She had an elevated BUN 45, Cr 2.33, and WBC 15,100. She was started on IV vancomycin and Meropenem. She was admitted for further evaluation.   Assessment & Plan:   Principal Problem:   Abscess of skin and subcutaneous tissue Active Problems:   Hypertension   HLD (hyperlipidemia)   CKD (chronic kidney disease), stage III   Paroxysmal SVT (supraventricular tachycardia) (HCC)   Hyperglycemia   Hyperosmolarity syndrome   Pressure injury of skin   MRSA bacteremia  1. Multiple Abcessesof skin and subcutaneous tissue. Blood cultures positive for MRSA. Continue IV Vancomycin. General surgery has been consulted and feels she has no indication of surgical intervention at this time. continue warm compresses and topical mupirocin. Continue to monitor.  2. MRSA bacteremia. Related to skin infection. Currently on vancomycin. Discussed with Dr. Ninetta LightsHatcher and recommendations were to check 2D echo and repeat cultures. Echo as below. She will need a two week course of Vancomycin. Repeat cultures sent this morning. Will request PICC line 3. Hyperosmolarity syndrome. Blood sugars were markedly elevated on admission, but significantly improved with insulin infusion. She was transitioned to lantus and became hypoglycemic. She briefly required a dextrose infusion. Sliding scale has been changed to sensitive. Dextrose was discontinued yesterday and blood sugars have been stable. Once blood sugars start trending up, will restart lower dose of lantus.   4. DM type 2. Recent A1c is 8.5. Blood sugars likely elevated on admission due to infection/recent steroid use. Continue to monitor. 5. AKI.  Creatinine was normal on last admission. Initially felt to be related to dehydration and volume depletion. She has received IV fluids and is now developing anasarca. No significant improvement in renal function and urine output has been unimpressive. Will consult nephrology. Monitor urine output. Continue to hold ACE I.  6. Hyponatremia. Likely has some element of pseudohyponatremia in the setting of hyperglycemia. Also likely has some volume depletion. 7. HTN. Stable. Continue lopressor 8. HLD. Continue statins 9. GERD. Continue PPI   DVT prophylaxis: Heparin  Code Status: Full  Family Communication: No family bedside Disposition Plan: Possible SNF placement    Consultants:   General surgery   Procedures:   Echo  - Left ventricle: The cavity size was normal. Wall thickness was   increased in a pattern of moderate LVH. Systolic function was   normal. The estimated ejection fraction was in the range of 55%   to 60%. Wall motion was normal; there were no regional wall   motion abnormalities. Doppler parameters are consistent with   abnormal left ventricular relaxation (grade 1 diastolic   dysfunction). - Aortic valve: Mildly calcified annulus. Trileaflet; mildly   thickened leaflets. Valve area (VTI): 1.81 cm^2. Valve area   (Vmax): 1.85 cm^2. Valve area (Vmean): 1.64 cm^2. - Mitral valve: Mildly calcified annulus. Mildly thickened leaflets   . - Right atrium: The atrium was mildly dilated. - Atrial septum: No defect or patent foramen ovale was identified. - Pulmonary arteries: Systolic pressure was moderately increased.   PA peak pressure: 42 mm Hg (S). - Pericardium, extracardiac: There is a large left pleural   effusion. There is a small circumferential pericaridal effusion. -  Technically adequate study.  Antimicrobials:   Vancomycin 9/20 >>    Subjective: Overall feeling better. Areas of infection over back are less painful  Objective: Vitals:   03/01/16 0100  03/01/16 0200 03/01/16 0400 03/01/16 0500  BP:   (!) 148/81   Pulse: 87 90 91   Resp: 15 12 17    Temp:   98.5 F (36.9 C)   TempSrc:   Oral   SpO2: 95% 92% 91%   Weight:    97.7 kg (215 lb 6.2 oz)  Height:        Intake/Output Summary (Last 24 hours) at 03/01/16 0619 Last data filed at 03/01/16 0500  Gross per 24 hour  Intake                0 ml  Output              500 ml  Net             -500 ml   Filed Weights   02/28/16 0500 02/29/16 0500 03/01/16 0500  Weight: 93.1 kg (205 lb 4 oz) 97.4 kg (214 lb 11.7 oz) 97.7 kg (215 lb 6.2 oz)    Examination:  General exam: Appears calm and comfortable  Respiratory system: Clear to auscultation. Respiratory effort normal. Cardiovascular system: S1 & S2 heard, RRR. No JVD, murmurs, rubs, gallops or clicks. 1+ pedal edema. Gastrointestinal system: Abdomen is nondistended, soft and nontender. No organomegaly or masses felt. Normal bowel sounds heard. Central nervous system: Alert and oriented. No focal neurological deficits. Extremities: Symmetric 5 x 5 power. Evidence of anasarca Skin: multiple areas of superficial pustular lesions over entire back, right shoulder, groin, left flank. Multiple areas are draining purulent drainage that is foul smelling Psychiatry: Judgement and insight appear normal. Mood & affect appropriate.     Data Reviewed: I have personally reviewed following labs and imaging studies  CBC:  Recent Labs Lab 02/27/16 1650 02/27/16 1721 02/28/16 0330 02/29/16 0430  WBC 15.4*  --  15.1* 20.4*  NEUTROABS 14.2*  --   --   --   HGB 10.8* 12.9 10.3* 11.0*  HCT 33.9* 38.0 31.0* 33.2*  MCV 92.6  --  88.6 88.3  PLT 345  --  325 311   Basic Metabolic Panel:  Recent Labs Lab 02/28/16 0712 02/28/16 1442 02/28/16 2241 02/29/16 0430 02/29/16 1638  NA 134* 131* 134* 134* 132*  K 3.0* 3.6 3.9 4.2 5.5*  CL 102 105 108 105 107  CO2 26 24 22 23  17*  GLUCOSE 298* 44* 82 91 42*  BUN 44* 43* 44* 44* 49*    CREATININE 2.16* 2.22* 2.13* 2.08* 2.43*  CALCIUM 8.1* 7.4* 7.9* 8.0* 8.3*  MG  --   --   --  1.5*  --    GFR: Estimated Creatinine Clearance: 27.4 mL/min (by C-G formula based on SCr of 2.43 mg/dL (H)). Liver Function Tests:  Recent Labs Lab 02/27/16 1650  AST 15  ALT 16  ALKPHOS 160*  BILITOT 0.6  PROT 6.9  ALBUMIN 1.9*   No results for input(s): LIPASE, AMYLASE in the last 168 hours. No results for input(s): AMMONIA in the last 168 hours. Coagulation Profile:  Recent Labs Lab 02/27/16 1650  INR 1.16   Cardiac Enzymes: No results for input(s): CKTOTAL, CKMB, CKMBINDEX, TROPONINI in the last 168 hours. BNP (last 3 results) No results for input(s): PROBNP in the last 8760 hours. HbA1C:  Recent Labs  02/27/16 2022  HGBA1C 11.9*  CBG:  Recent Labs Lab 02/29/16 1633 02/29/16 1659 02/29/16 1948 03/01/16 0037 03/01/16 0355  GLUCAP 44* 123* 84 107* 123*   Lipid Profile: No results for input(s): CHOL, HDL, LDLCALC, TRIG, CHOLHDL, LDLDIRECT in the last 72 hours. Thyroid Function Tests: No results for input(s): TSH, T4TOTAL, FREET4, T3FREE, THYROIDAB in the last 72 hours. Anemia Panel: No results for input(s): VITAMINB12, FOLATE, FERRITIN, TIBC, IRON, RETICCTPCT in the last 72 hours. Sepsis Labs:  Recent Labs Lab 02/27/16 1720 02/27/16 2022  LATICACIDVEN 2.0* 3.5*    Recent Results (from the past 240 hour(s))  Blood culture (routine x 2)     Status: Abnormal (Preliminary result)   Collection Time: 02/27/16  5:06 PM  Result Value Ref Range Status   Specimen Description BLOOD LEFT ANTECUBITAL  Final   Special Requests BOTTLES DRAWN AEROBIC AND ANAEROBIC 10CC EACH  Final   Culture  Setup Time   Final    GRAM POSITIVE COCCI IN CLUSTERS RECOVERED FROM BOTH THE AEROBIC AND ANAEROBIC BOTTLES Gram Stain Report Called to,Read Back By and Verified With: SCHONEWITZ,L. AT 0909  ON 02/28/2016 BY EVA Performed at [facility]    Culture STAPHYLOCOCCUS  AUREUS (A)  Final   Report Status PENDING  Incomplete  Blood Culture ID Panel (Reflexed)     Status: Abnormal   Collection Time: 02/27/16  5:06 PM  Result Value Ref Range Status   Enterococcus species NOT DETECTED NOT DETECTED Final   Listeria monocytogenes NOT DETECTED NOT DETECTED Final   Staphylococcus species DETECTED (A) NOT DETECTED Final    Comment: CRITICAL RESULT CALLED TO, READ BACK BY AND VERIFIED WITH: S. Hall Pharm.D. 14:25 02/28/16 ([email or web address])    Staphylococcus aureus DETECTED (A) NOT DETECTED Final    Comment: CRITICAL RESULT CALLED TO, READ BACK BY AND VERIFIED WITH: S. Hall Pharm.D. 14:25 02/28/16 ([email or web address])    Methicillin resistance DETECTED (A) NOT DETECTED Final    Comment: CRITICAL RESULT CALLED TO, READ BACK BY AND VERIFIED WITH: S. Hall Pharm.D. 14:25 02/28/16 ([email or web address])    Streptococcus species NOT DETECTED NOT DETECTED Final   Streptococcus agalactiae NOT DETECTED NOT DETECTED Final   Streptococcus pneumoniae NOT DETECTED NOT DETECTED Final   Streptococcus pyogenes NOT DETECTED NOT DETECTED Final   Acinetobacter baumannii NOT DETECTED NOT DETECTED Final   Enterobacteriaceae species NOT DETECTED NOT DETECTED Final   Enterobacter cloacae complex NOT DETECTED NOT DETECTED Final   Escherichia coli NOT DETECTED NOT DETECTED Final   Klebsiella oxytoca NOT DETECTED NOT DETECTED Final   Klebsiella pneumoniae NOT DETECTED NOT DETECTED Final   Proteus species NOT DETECTED NOT DETECTED Final   Serratia marcescens NOT DETECTED NOT DETECTED Final   Haemophilus influenzae NOT DETECTED NOT DETECTED Final   Neisseria meningitidis NOT DETECTED NOT DETECTED Final   Pseudomonas aeruginosa NOT DETECTED NOT DETECTED Final   Candida albicans NOT DETECTED NOT DETECTED Final   Candida glabrata NOT DETECTED NOT DETECTED Final   Candida krusei NOT DETECTED NOT DETECTED Final   Candida parapsilosis NOT DETECTED NOT DETECTED Final   Candida tropicalis NOT DETECTED NOT DETECTED Final     Comment: Performed at [facility]  Blood culture (routine x 2)     Status: Abnormal (Preliminary result)   Collection Time: 02/27/16  5:13 PM  Result Value Ref Range Status   Specimen Description BLOOD RIGHT ANTECUBITAL  Final   Special Requests BOTTLES DRAWN AEROBIC AND ANAEROBIC [facility] EACH  Final   Culture  Setup Time   Final  GRAM POSITIVE COCCI IN CLUSTERS RECOVERED FROM BOTH AEROBIC AND ANAEROEBIC BOTTLE Gram Stain Report Called to,Read Back By and Verified With: SCHONEWITZ, L. AT 0909 ON 02/28/2016 BY EVA    Culture STAPHYLOCOCCUS AUREUS (A)  Final   Report Status PENDING  Incomplete  MRSA PCR Screening     Status: Abnormal   Collection Time: 02/27/16 10:00 PM  Result Value Ref Range Status   MRSA by PCR POSITIVE (A) NEGATIVE Final    Comment:        The GeneXpert MRSA Assay (FDA approved for NASAL specimens only), is one component of a comprehensive MRSA colonization surveillance program. It is not intended to diagnose MRSA infection nor to guide or monitor treatment for MRSA infections. RESULT CALLED TO, READ BACK BY AND VERIFIED WITH: SPANGLER,E. AT 1030 ON 02/28/2016 BY BAUGHAM,M.          Radiology Studies: No results found.      Scheduled Meds: . aspirin EC  325 mg Oral Daily  . Chlorhexidine Gluconate Cloth  6 each Topical Q0600  . collagenase   Topical Daily  . FLUoxetine  20 mg Oral BID  . heparin  5,000 Units Subcutaneous Q8H  . Influenza vac split quadrivalent PF  0.5 mL Intramuscular Tomorrow-1000  . insulin aspart  0-5 Units Subcutaneous QHS  . insulin aspart  0-9 Units Subcutaneous TID WC  . metoprolol tartrate  25 mg Oral BID  . mupirocin ointment  1 application Nasal BID  . mupirocin ointment   Topical TID  . pantoprazole  40 mg Oral Daily  . pravastatin  40 mg Oral QHS  . sodium chloride flush  3 mL Intravenous Q12H  . vancomycin  1,000 mg Intravenous Q24H   Continuous Infusions:    LOS: 3 days    Time spent: 25 minutes      Erick Blinks, MD [facility] If 7PM-7AM, please contact night-coverage [email or web address] Password [facility] 03/01/2016, 6:19 AM

## 2016-03-02 LAB — CBC
HCT: 27.2 % — ABNORMAL LOW (ref 36.0–46.0)
HEMOGLOBIN: 8.8 g/dL — AB (ref 12.0–15.0)
MCH: 28.8 pg (ref 26.0–34.0)
MCHC: 32.4 g/dL (ref 30.0–36.0)
MCV: 88.9 fL (ref 78.0–100.0)
Platelets: 274 10*3/uL (ref 150–400)
RBC: 3.06 MIL/uL — AB (ref 3.87–5.11)
RDW: 15.1 % (ref 11.5–15.5)
WBC: 8.1 10*3/uL (ref 4.0–10.5)

## 2016-03-02 LAB — GLUCOSE, CAPILLARY
GLUCOSE-CAPILLARY: 143 mg/dL — AB (ref 65–99)
GLUCOSE-CAPILLARY: 110 mg/dL — AB (ref 65–99)
Glucose-Capillary: 114 mg/dL — ABNORMAL HIGH (ref 65–99)
Glucose-Capillary: 127 mg/dL — ABNORMAL HIGH (ref 65–99)
Glucose-Capillary: 151 mg/dL — ABNORMAL HIGH (ref 65–99)
Glucose-Capillary: 152 mg/dL — ABNORMAL HIGH (ref 65–99)

## 2016-03-02 MED ORDER — DIPHENHYDRAMINE HCL 25 MG PO CAPS
25.0000 mg | ORAL_CAPSULE | Freq: Four times a day (QID) | ORAL | Status: DC | PRN
  Administered 2016-03-02 – 2016-03-04 (×6): 25 mg via ORAL
  Filled 2016-03-02 (×6): qty 1

## 2016-03-02 NOTE — Progress Notes (Signed)
Subjective: Interval History: has no complaint of nausea or vomiting. Patient also denies any difficulty breathing. Presently her only complaint is some back pain otherwise patient overall feels good..  Objective: Vital signs in last 24 hours: Temp:  [98.9 F (37.2 C)] 98.9 F (37.2 C) (09/22 2113) Pulse Rate:  [85-88] 88 (09/22 2113) Resp:  [18] 18 (09/22 2113) BP: (120-133)/(59-66) 133/59 (09/22 2113) SpO2:  [94 %-95 %] 95 % (09/22 2113) Weight change:   Intake/Output from previous day: 09/22 0701 - 09/23 0700 In: 2054.7 [P.O.:840; I.V.:1214.7] Out: -  Intake/Output this shift: No intake/output data recorded.  General appearance: alert, cooperative and no distress Resp: clear to auscultation bilaterally Cardio: regular rate and rhythm GI: Abdomen is obese, nontender and difficult to palpate organomegaly Extremities: No edema  Lab Results:  Recent Labs  02/29/16 0430 03/01/16 0547  WBC 20.4* 12.2*  HGB 11.0* 9.9*  HCT 33.2* 29.4*  PLT 311 301   BMET:  Recent Labs  02/29/16 1638 03/01/16 0547  NA 132* 132*  K 5.5* 4.3  CL 107 103  CO2 17* 23  GLUCOSE 42* 117*  BUN 49* 47*  CREATININE 2.43* 2.26*  CALCIUM 8.3* 8.2*   No results for input(s): PTH in the last 72 hours. Iron Studies: No results for input(s): IRON, TIBC, TRANSFERRIN, FERRITIN in the last 72 hours.  Studies/Results: No results found.  I have reviewed the patient's current medications.  Assessment/Plan: Problem #1 acute kidney injury:  Taught to be secondary to prerenal/ATN/ACE. Presently her pending creatinine show some improvement. Patient is presently nonoliguric Problem #2 previous history of acute kidney injury requiring dialysis as an inpatient. Problem #3 anemia: Her hemoglobin is low. Possibly iron deficiency anemia Problem #4 hyperkalemia: Potassium has corrected Problem #5 history of hypertension: Her blood pressure is reasonably controlled Problem #6 diabetes: Her blood sugar is  reasonably controlled Problem #7 cellulitis: Presently she is a febrile. The patient is on antibiotics and her white blood cell count is improving. Plan: We'll continue his present management We'll check renal panel in the morning. We'll check iron studies in the morning   LOS: 4 days   Fionna Merriott S 03/02/2016,8:23 AM

## 2016-03-02 NOTE — Progress Notes (Signed)
1930 Drsgs changed to abscesses to patient's back, RIGHT shoulder region and RIGHT deltoid region. Removed drsgs noted saturated with yellowish-green, purulent, odorous drainage. Area to mid lower back noted slightly bleeding. All areas cleansed and patted dry. Rx ointments applied to abscessed areas as prescribed and CDDs applied. Patient tolerated well w/no c/o pain or discomfort noted.

## 2016-03-02 NOTE — Progress Notes (Signed)
PROGRESS NOTE    [name]  ZOX:[id number] DOB: 1956-03-20 DOA: 02/27/2016 PCP: [name], MD   Brief Narrative:  11 yof with a hx of HTN, HLD, DM type 2, and CKD stage 3 presented with complaints of worsening back and chest abscesses. While in the ED she was noted to be hyponatremic and hyperglycemic. She had an elevated BUN 45, Cr 2.33, and WBC 15,100. She was started on IV vancomycin and Meropenem. She was admitted for further evaluation.   Assessment & Plan:   Principal Problem:   Abscess of skin and subcutaneous tissue Active Problems:   Hypertension   HLD (hyperlipidemia)   CKD (chronic kidney disease), stage III   Paroxysmal SVT (supraventricular tachycardia) (HCC)   Hyperglycemia   Hyperosmolarity syndrome   Pressure injury of skin   MRSA bacteremia  1. Multiple Abcessesof skin and subcutaneous tissue. Blood cultures positive for MRSA. Continue IV Vancomycin. General surgery has been consulted and feels she has no indication of surgical intervention at this time. Continue warm compresses and topical mupirocin. Continue to monitor.  2. MRSAbacteremia. Related to skin infection. Currently on vancomycin. Discussed with Dr. Ninetta Lights and recommendations were to check 2D echo and repeat cultures. Echo as below. She will need a two week course of Vancomycin. Repeat cultures have shown no growth to date. Patient has been placed with a PICC line. 3. Hyperosmolarity syndrome. Blood sugars were markedly elevated on admission, but significantly improved with insulin infusion. She was transitioned to lantus and became hypoglycemic. She briefly required a dextrose infusion, which has since been discontinued. Currently on sliding scale insulin with stable blood sugars. 4. DM type 2. Recent A1c is 11.9. Blood sugars likely elevated on admission due to infection/recent steroid use. Continue to monitor. 5. AKI. Creatinine was normal on last admission. Nephrology following. AKI felt  to be related to ATN/ACE/pre-renal syndrome. She has been started on IVFs and IV lasix. Overall, she feels her anasarca is improving. Recheck labs in am. Continue to hold ace inhibitor. 6. HTN. Stable. Continue lopressor. 7. HLD. Continue statins. 8. GERD. Continue PPI.   DVT prophylaxis: Heparin Code Status: Full Family Communication: No family bedside Disposition Plan: Discharge home once improved   Consultants:   General surgery  Nephrology  Procedures:   Echo -Left ventricle: The cavity size was normal. Wall thickness was increased in a pattern of moderate LVH. Systolic function was normal. The estimated ejection fraction was in the range of 55% to 60%. Wall motion was normal; there were no regional wall motion abnormalities. Doppler parameters are consistent with abnormal left ventricular relaxation (grade 1 diastolic dysfunction). - Aortic valve: Mildly calcified annulus. Trileaflet; mildly thickened leaflets. Valve area (VTI): 1.81 cm^2. Valve area (Vmax): 1.85 cm^2. Valve area (Vmean): 1.64 cm^2. - Mitral valve: Mildly calcified annulus. Mildly thickened leaflets. - Right atrium: The atrium was mildly dilated. - Atrial septum: No defect or patent foramen ovale was identified. - Pulmonary arteries: Systolic pressure was moderately increased. PA peak pressure: 42 mm Hg (S). - Pericardium, extracardiac: There is a large left pleural effusion. There is a small circumferential pericaridal effusion. - Technically adequate study.  Antimicrobials:   Vancomycin 9/20 >>   Subjective: Feeling better overall. Swelling has gone down. Her back is feeling better. She is urinating.  Objective: Vitals:   03/01/16 0800 03/01/16 1059 03/01/16 1530 03/01/16 2113  BP:  120/66  (!) 133/59  Pulse:  85  88  Resp:    18  Temp: 98.1 F (36.7  C)   98.9 F (37.2 C)  TempSrc: Oral   Rectal  SpO2:   94% 95%  Weight:      Height:        Intake/Output  Summary (Last 24 hours) at 03/02/16 1610 Last data filed at 03/02/16 0300  Gross per 24 hour  Intake          2054.67 ml  Output                0 ml  Net          2054.67 ml   Filed Weights   02/28/16 0500 02/29/16 0500 03/01/16 0500  Weight: 93.1 kg (205 lb 4 oz) 97.4 kg (214 lb 11.7 oz) 97.7 kg (215 lb 6.2 oz)    Examination:  General exam: Appears calm and comfortable  Respiratory system: Clear to auscultation. Respiratory effort normal. Cardiovascular system: S1 & S2 heard, RRR. No JVD, murmurs, rubs, gallops or clicks. 1+ pedal edema. Gastrointestinal system: Abdomen is nondistended, soft and nontender. No organomegaly or masses felt. Normal bowel sounds heard. Central nervous system: Alert and oriented. No focal neurological deficits. Extremities: Symmetric 5 x 5 power. Skin: multiple areas of superficial pustular lesions over entire back, right shoulder, groin, left flank. Multiple areas are draining purulent drainage that is foul smelling Psychiatry: Judgement and insight appear normal. Mood & affect appropriate.     Data Reviewed: I have personally reviewed following labs and imaging studies  CBC:  Recent Labs Lab 02/27/16 1650 02/27/16 1721 02/28/16 0330 02/29/16 0430 03/01/16 0547  WBC 15.4*  --  15.1* 20.4* 12.2*  NEUTROABS 14.2*  --   --   --   --   HGB 10.8* 12.9 10.3* 11.0* 9.9*  HCT 33.9* 38.0 31.0* 33.2* 29.4*  MCV 92.6  --  88.6 88.3 87.8  PLT 345  --  325 311 301   Basic Metabolic Panel:  Recent Labs Lab 02/28/16 1442 02/28/16 2241 02/29/16 0430 02/29/16 1638 03/01/16 0547  NA 131* 134* 134* 132* 132*  K 3.6 3.9 4.2 5.5* 4.3  CL 105 108 105 107 103  CO2 24 22 23  17* 23  GLUCOSE 44* 82 91 42* 117*  BUN 43* 44* 44* 49* 47*  CREATININE 2.22* 2.13* 2.08* 2.43* 2.26*  CALCIUM 7.4* 7.9* 8.0* 8.3* 8.2*  MG  --   --  1.5*  --   --    GFR: Estimated Creatinine Clearance: 29.5 mL/min (by C-G formula based on SCr of 2.26 mg/dL (H)). Liver  Function Tests:  Recent Labs Lab 02/27/16 1650  AST 15  ALT 16  ALKPHOS 160*  BILITOT 0.6  PROT 6.9  ALBUMIN 1.9*   No results for input(s): LIPASE, AMYLASE in the last 168 hours. No results for input(s): AMMONIA in the last 168 hours. Coagulation Profile:  Recent Labs Lab 02/27/16 1650  INR 1.16   Cardiac Enzymes: No results for input(s): CKTOTAL, CKMB, CKMBINDEX, TROPONINI in the last 168 hours. BNP (last 3 results) No results for input(s): PROBNP in the last 8760 hours. HbA1C: No results for input(s): HGBA1C in the last 72 hours. CBG:  Recent Labs Lab 03/01/16 1617 03/01/16 2110 03/02/16 0046 03/02/16 0604 03/02/16 0737  GLUCAP 156* 151* 114* 127* 143*   Lipid Profile: No results for input(s): CHOL, HDL, LDLCALC, TRIG, CHOLHDL, LDLDIRECT in the last 72 hours. Thyroid Function Tests: No results for input(s): TSH, T4TOTAL, FREET4, T3FREE, THYROIDAB in the last 72 hours. Anemia Panel: No results for input(s): VITAMINB12,  FOLATE, FERRITIN, TIBC, IRON, RETICCTPCT in the last 72 hours. Sepsis Labs:  Recent Labs Lab 02/27/16 1720 02/27/16 2022  LATICACIDVEN 2.0* 3.5*    Recent Results (from the past 240 hour(s))  Blood culture (routine x 2)     Status: Abnormal   Collection Time: 02/27/16  5:06 PM  Result Value Ref Range Status   Specimen Description BLOOD LEFT ANTECUBITAL  Final   Special Requests BOTTLES DRAWN AEROBIC AND ANAEROBIC 10CC EACH  Final   Culture  Setup Time   Final    GRAM POSITIVE COCCI IN CLUSTERS RECOVERED FROM BOTH THE AEROBIC AND ANAEROBIC BOTTLES Gram Stain Report Called to,Read Back By and Verified With: SCHONEWITZ,L. AT 0909  ON 02/28/2016 BY EVA Performed at [facility]    Culture METHICILLIN RESISTANT STAPHYLOCOCCUS AUREUS (A)  Final   Report Status 03/01/2016 FINAL  Final   Organism ID, Bacteria METHICILLIN RESISTANT STAPHYLOCOCCUS AUREUS  Final      Susceptibility   Methicillin resistant staphylococcus aureus - MIC*     CIPROFLOXACIN >=8 RESISTANT Resistant     ERYTHROMYCIN >=8 RESISTANT Resistant     GENTAMICIN <=0.5 SENSITIVE Sensitive     OXACILLIN >=4 RESISTANT Resistant     TETRACYCLINE <=1 SENSITIVE Sensitive     VANCOMYCIN <=0.5 SENSITIVE Sensitive     TRIMETH/SULFA <=10 SENSITIVE Sensitive     CLINDAMYCIN <=0.25 SENSITIVE Sensitive     RIFAMPIN <=0.5 SENSITIVE Sensitive     Inducible Clindamycin NEGATIVE Sensitive     * METHICILLIN RESISTANT STAPHYLOCOCCUS AUREUS  Blood Culture ID Panel (Reflexed)     Status: Abnormal   Collection Time: 02/27/16  5:06 PM  Result Value Ref Range Status   Enterococcus species NOT DETECTED NOT DETECTED Final   Listeria monocytogenes NOT DETECTED NOT DETECTED Final   Staphylococcus species DETECTED (A) NOT DETECTED Final    Comment: CRITICAL RESULT CALLED TO, READ BACK BY AND VERIFIED WITH: S. Hall Pharm.D. 14:25 02/28/16 ([email or web address])    Staphylococcus aureus DETECTED (A) NOT DETECTED Final    Comment: CRITICAL RESULT CALLED TO, READ BACK BY AND VERIFIED WITH: S. Hall Pharm.D. 14:25 02/28/16 ([email or web address])    Methicillin resistance DETECTED (A) NOT DETECTED Final    Comment: CRITICAL RESULT CALLED TO, READ BACK BY AND VERIFIED WITH: S. Hall Pharm.D. 14:25 02/28/16 ([email or web address])    Streptococcus species NOT DETECTED NOT DETECTED Final   Streptococcus agalactiae NOT DETECTED NOT DETECTED Final   Streptococcus pneumoniae NOT DETECTED NOT DETECTED Final   Streptococcus pyogenes NOT DETECTED NOT DETECTED Final   Acinetobacter baumannii NOT DETECTED NOT DETECTED Final   Enterobacteriaceae species NOT DETECTED NOT DETECTED Final   Enterobacter cloacae complex NOT DETECTED NOT DETECTED Final   Escherichia coli NOT DETECTED NOT DETECTED Final   Klebsiella oxytoca NOT DETECTED NOT DETECTED Final   Klebsiella pneumoniae NOT DETECTED NOT DETECTED Final   Proteus species NOT DETECTED NOT DETECTED Final   Serratia marcescens NOT DETECTED NOT DETECTED Final   Haemophilus influenzae  NOT DETECTED NOT DETECTED Final   Neisseria meningitidis NOT DETECTED NOT DETECTED Final   Pseudomonas aeruginosa NOT DETECTED NOT DETECTED Final   Candida albicans NOT DETECTED NOT DETECTED Final   Candida glabrata NOT DETECTED NOT DETECTED Final   Candida krusei NOT DETECTED NOT DETECTED Final   Candida parapsilosis NOT DETECTED NOT DETECTED Final   Candida tropicalis NOT DETECTED NOT DETECTED Final    Comment: [facility]  Blood culture (routine x 2)     Status: Abnormal  Collection Time: 02/27/16  5:13 PM  Result Value Ref Range Status   Specimen Description BLOOD RIGHT ANTECUBITAL  Final   Special Requests BOTTLES DRAWN AEROBIC AND ANAEROBIC 5CC EACH  Final   Culture  Setup Time   Final    GRAM POSITIVE COCCI IN CLUSTERS RECOVERED FROM BOTH AEROBIC AND ANAEROEBIC BOTTLE Gram Stain Report Called to,Read Back By and Verified With: SCHONEWITZ, L. AT 0909 ON 02/28/2016 BY EVA    Culture (A)  Final    STAPHYLOCOCCUS AUREUS SUSCEPTIBILITIES PERFORMED ON PREVIOUS CULTURE WITHIN THE LAST 5 DAYS. Performed at [facility]    Report Status 03/01/2016 FINAL  Final  MRSA PCR Screening     Status: Abnormal   Collection Time: 02/27/16 10:00 PM  Result Value Ref Range Status   MRSA by PCR POSITIVE (A) NEGATIVE Final    Comment:        The GeneXpert MRSA Assay (FDA approved for NASAL specimens only), is one component of a comprehensive MRSA colonization surveillance program. It is not intended to diagnose MRSA infection nor to guide or monitor treatment for MRSA infections. RESULT CALLED TO, READ BACK BY AND VERIFIED WITH: SPANGLER,E. AT 1030 ON 02/28/2016 BY BAUGHAM,M.   Culture, blood (Routine X 2) w Reflex to ID Panel     Status: None (Preliminary result)   Collection Time: 03/01/16  5:58 AM  Result Value Ref Range Status   Specimen Description BLOOD RIGHT HAND  Final   Special Requests BOTTLES DRAWN AEROBIC AND ANAEROBIC 4CC  Final   Culture NO  GROWTH < 24 HOURS  Final   Report Status PENDING  Incomplete  Culture, blood (Routine X 2) w Reflex to ID Panel     Status: None (Preliminary result)   Collection Time: 03/01/16  5:58 AM  Result Value Ref Range Status   Specimen Description BLOOD RIGHT HAND  Final   Special Requests BOTTLES DRAWN AEROBIC AND ANAEROBIC 6CC  Final   Culture NO GROWTH < 24 HOURS  Final   Report Status PENDING  Incomplete         Radiology Studies: No results found.      Scheduled Meds: . aspirin EC  325 mg Oral Daily  . Chlorhexidine Gluconate Cloth  6 each Topical Q0600  . collagenase   Topical Daily  . FLUoxetine  20 mg Oral BID  . furosemide  40 mg Intravenous BID  . heparin  5,000 Units Subcutaneous Q8H  . insulin aspart  0-5 Units Subcutaneous QHS  . insulin aspart  0-9 Units Subcutaneous TID WC  . metoprolol tartrate  25 mg Oral BID  . mupirocin ointment  1 application Nasal BID  . mupirocin ointment   Topical TID  . pantoprazole  40 mg Oral Daily  . pravastatin  40 mg Oral QHS  . sodium chloride flush  3 mL Intravenous Q12H  . [START ON 03/03/2016] vancomycin  750 mg Intravenous Q24H   Continuous Infusions: . sodium chloride 100 mL/hr at 03/02/16 0510     LOS: 4 days    Time spent: 25 minutes    Erick Blinks, MD [facility] If 7PM-7AM, please contact night-coverage [email or web address] Password [email or web address] 03/02/2016, 8:22 AM   By signing my name below, I, Nikki Dom, attest that this documentation has been prepared under the direction and in the presence of Erick Blinks, MD. Electronically signed: Nikki Dom, Scribe. 03/02/16 12:49 PM  I, Dr. Erick Blinks, personally performed the services described in this documentaiton. All medical record  entries made by the scribe were at my direction and in my presence. I have reviewed the chart and agree that the record reflects my personal performance and is accurate and complete  Erick Blinks, MD, 03/02/2016 12:59 PM

## 2016-03-03 LAB — BASIC METABOLIC PANEL
Anion gap: 5 (ref 5–15)
BUN: 42 mg/dL — AB (ref 6–20)
CALCIUM: 7.3 mg/dL — AB (ref 8.9–10.3)
CO2: 19 mmol/L — ABNORMAL LOW (ref 22–32)
CREATININE: 1.91 mg/dL — AB (ref 0.44–1.00)
Chloride: 111 mmol/L (ref 101–111)
GFR calc Af Amer: 32 mL/min — ABNORMAL LOW (ref >60)
GFR, EST NON AFRICAN AMERICAN: 27 mL/min — AB (ref >60)
Glucose, Bld: 223 mg/dL — ABNORMAL HIGH (ref 65–99)
Potassium: 3.8 mmol/L (ref 3.5–5.1)
SODIUM: 135 mmol/L (ref 135–145)

## 2016-03-03 LAB — GLUCOSE, CAPILLARY
GLUCOSE-CAPILLARY: 222 mg/dL — AB (ref 65–99)
Glucose-Capillary: 247 mg/dL — ABNORMAL HIGH (ref 65–99)
Glucose-Capillary: 189 mg/dL — ABNORMAL HIGH (ref 65–99)

## 2016-03-03 LAB — RENAL FUNCTION PANEL
ALBUMIN: 1.4 g/dL — AB (ref 3.5–5.0)
Anion gap: 9 (ref 5–15)
BUN: 42 mg/dL — AB (ref 6–20)
CO2: 19 mmol/L — ABNORMAL LOW (ref 22–32)
CREATININE: 1.91 mg/dL — AB (ref 0.44–1.00)
Calcium: 7.3 mg/dL — ABNORMAL LOW (ref 8.9–10.3)
Chloride: 110 mmol/L (ref 101–111)
GFR calc Af Amer: 32 mL/min — ABNORMAL LOW (ref >60)
GFR, EST NON AFRICAN AMERICAN: 27 mL/min — AB (ref >60)
Glucose, Bld: 223 mg/dL — ABNORMAL HIGH (ref 65–99)
PHOSPHORUS: 3.3 mg/dL (ref 2.5–4.6)
POTASSIUM: 3.8 mmol/L (ref 3.5–5.1)
Sodium: 138 mmol/L (ref 135–145)

## 2016-03-03 LAB — CBC
HCT: 24.5 % — ABNORMAL LOW (ref 36.0–46.0)
Hemoglobin: 8 g/dL — ABNORMAL LOW (ref 12.0–15.0)
MCH: 28.8 pg (ref 26.0–34.0)
MCHC: 32.7 g/dL (ref 30.0–36.0)
MCV: 88.1 fL (ref 78.0–100.0)
PLATELETS: 249 10*3/uL (ref 150–400)
RBC: 2.78 MIL/uL — ABNORMAL LOW (ref 3.87–5.11)
RDW: 15 % (ref 11.5–15.5)
WBC: 4.8 10*3/uL (ref 4.0–10.5)

## 2016-03-03 MED ORDER — INSULIN GLARGINE 100 UNIT/ML ~~LOC~~ SOLN
10.0000 [IU] | Freq: Every day | SUBCUTANEOUS | Status: DC
  Administered 2016-03-03 – 2016-03-04 (×2): 10 [IU] via SUBCUTANEOUS
  Filled 2016-03-03 (×3): qty 0.1

## 2016-03-03 NOTE — Progress Notes (Signed)
PROGRESS NOTE    [name]  ZOX:[id number] DOB: 1956-02-18 DOA: 02/27/2016 PCP: [name], MD   Brief Narrative:  57 yof with a hx of HTN, HLD, DM type 2, and CKD stage 3 presented with complaints of worsening back and chest abscesses. While in the ED she was noted to be hyponatremic and hyperglycemic. She had an elevated BUN 45, Cr 2.33, and WBC 15,100. She was started on IV vancomycin and Meropenem. She was admitted for further evaluation.   Assessment & Plan:   Principal Problem:   Abscess of skin and subcutaneous tissue Active Problems:   Hypertension   HLD (hyperlipidemia)   CKD (chronic kidney disease), stage III   Paroxysmal SVT (supraventricular tachycardia) (HCC)   Hyperglycemia   Hyperosmolarity syndrome   Pressure injury of skin   MRSA bacteremia  1. Multiple Abcessesof skin and subcutaneous tissue. Blood cultures positive for MRSA. Continue IV Vancomycin. General surgery has been consulted and feels she has no indication of surgical intervention at this time. Continue warm compresses and topical mupirocin. Continue to monitor.  2. MRSAbacteremia. Related to skin infection. Currently on vancomycin. Discussed with Dr. Ninetta Lights and recommendations were to check 2D echo and repeat cultures. Echo as below. She will need a two week course of Vancomycin. Repeat cultures have shown no growth to date. Patient has been placed with a PICC line. 3. Hyperosmolarity syndrome. Blood sugars were markedly elevated on admission, but significantly improved with insulin infusion. She was transitioned to lantus and became hypoglycemic. She briefly required a dextrose infusion, which has since been discontinued. Currently on sliding scale insulin and blood sugars are starting to rise. We will restart lower dose lantus. 4. DM type 2. Recent A1c is 11.9. Blood sugars likely elevated on admission due to infection/recent steroid use. Glucose is currently 223. Continue to  monitor. 5. AKI. Creatinine was normal on last admission. Currently elevated at 1.91 with a BUN of 42. Nephrology following. AKI felt to be related to ATN/ACE/pre-renal syndrome. She has been started on IVFs and IV lasix. Overall, she feels her anasarca is improving. Renal function is slowly improving. Recheck labs in am. Continue to hold ace inhibitor. 6. HTN. Stable. Continue lopressor. 7. HLD. Continue statins. 8. GERD. Continue PPI.   DVT prophylaxis: Heparin Code Status: Full Family Communication: Discussed with patient Disposition Plan: Will likely need skilled nursing facility placement on discharge   Consultants:   General surgery  Nephrology  Procedures:   PICC line 9/22  Echo -Left ventricle: The cavity size was normal. Wall thickness was increased in a pattern of moderate LVH. Systolic function was normal. The estimated ejection fraction was in the range of 55% to 60%. Wall motion was normal; there were no regional wall motion abnormalities. Doppler parameters are consistent with abnormal left ventricular relaxation (grade 1 diastolic dysfunction). - Aortic valve: Mildly calcified annulus. Trileaflet; mildly thickened leaflets. Valve area (VTI): 1.81 cm^2. Valve area (Vmax): 1.85 cm^2. Valve area (Vmean): 1.64 cm^2. - Mitral valve: Mildly calcified annulus. Mildly thickened leaflets. - Right atrium: The atrium was mildly dilated. - Atrial septum: No defect or patent foramen ovale was identified. - Pulmonary arteries: Systolic pressure was moderately increased. PA peak pressure: 42 mm Hg (S). - Pericardium, extracardiac: There is a large left pleural effusion. There is a small circumferential pericaridal effusion. - Technically adequate study.  Antimicrobials:   Vancomycin 9/20 >>   Subjective: Feeling well. Overall pain and back is improving. Feels that generalized swelling is also improving  Objective: Vitals:   03/01/16 1530  03/01/16 2113 03/02/16 0500 03/02/16 1522  BP:  (!) 133/59 124/78 136/71  Pulse:  88 89 82  Resp:  18 18 20   Temp:  98.9 F (37.2 C) 98.7 F (37.1 C) 98.9 F (37.2 C)  TempSrc:  Rectal Oral Oral  SpO2: 94% 95% 94% 95%  Weight:      Height:        Intake/Output Summary (Last 24 hours) at 03/03/16 0808 Last data filed at 03/03/16 1610  Gross per 24 hour  Intake                0 ml  Output             1700 ml  Net            -1700 ml   Filed Weights   02/28/16 0500 02/29/16 0500 03/01/16 0500  Weight: 93.1 kg (205 lb 4 oz) 97.4 kg (214 lb 11.7 oz) 97.7 kg (215 lb 6.2 oz)    Examination:   General exam: Appears calm and comfortable  Respiratory system: Clear to auscultation. Respiratory effort normal. Cardiovascular system: S1 & S2 heard, RRR. No JVD, murmurs, rubs, gallops or clicks. 1+pedal edema. Gastrointestinal system: Abdomen is nondistended, soft and nontender. No organomegaly or masses felt. Normal bowel sounds heard. Central nervous system: Alert and oriented. No focal neurological deficits. Extremities: Symmetric 5 x 5 power. Psychiatry: Judgement and insight appear normal. Mood & affect appropriate.    Data Reviewed: I have personally reviewed following labs and imaging studies  CBC:  Recent Labs Lab 02/27/16 1650  02/28/16 0330 02/29/16 0430 03/01/16 0547 03/02/16 0805 03/03/16 0630  WBC 15.4*  --  15.1* 20.4* 12.2* 8.1 4.8  NEUTROABS 14.2*  --   --   --   --   --   --   HGB 10.8*  < > 10.3* 11.0* 9.9* 8.8* 8.0*  HCT 33.9*  < > 31.0* 33.2* 29.4* 27.2* 24.5*  MCV 92.6  --  88.6 88.3 87.8 88.9 88.1  PLT 345  --  325 311 301 274 249  < > = values in this interval not displayed. Basic Metabolic Panel:  Recent Labs Lab 02/28/16 2241 02/29/16 0430 02/29/16 1638 03/01/16 0547 03/03/16 0630  NA 134* 134* 132* 132* 138  135  K 3.9 4.2 5.5* 4.3 3.8  3.8  CL 108 105 107 103 110  111  CO2 22 23 17* 23 19*  19*  GLUCOSE 82 91 42* 117* 223*  223*   BUN 44* 44* 49* 47* 42*  42*  CREATININE 2.13* 2.08* 2.43* 2.26* 1.91*  1.91*  CALCIUM 7.9* 8.0* 8.3* 8.2* 7.3*  7.3*  MG  --  1.5*  --   --   --   PHOS  --   --   --   --  3.3   GFR: Estimated Creatinine Clearance: 34.9 mL/min (by C-G formula based on SCr of 1.91 mg/dL (H)). Liver Function Tests:  Recent Labs Lab 02/27/16 1650 03/03/16 0630  AST 15  --   ALT 16  --   ALKPHOS 160*  --   BILITOT 0.6  --   PROT 6.9  --   ALBUMIN 1.9* 1.4*   No results for input(s): LIPASE, AMYLASE in the last 168 hours. No results for input(s): AMMONIA in the last 168 hours. Coagulation Profile:  Recent Labs Lab 02/27/16 1650  INR 1.16   Cardiac Enzymes: No results for input(s): CKTOTAL,  CKMB, CKMBINDEX, TROPONINI in the last 168 hours. BNP (last 3 results) No results for input(s): PROBNP in the last 8760 hours. HbA1C: No results for input(s): HGBA1C in the last 72 hours. CBG:  Recent Labs Lab 03/02/16 0737 03/02/16 1105 03/02/16 1649 03/02/16 2126 03/03/16 0725  GLUCAP 143* 152* 110* 151* 222*   Lipid Profile: No results for input(s): CHOL, HDL, LDLCALC, TRIG, CHOLHDL, LDLDIRECT in the last 72 hours. Thyroid Function Tests: No results for input(s): TSH, T4TOTAL, FREET4, T3FREE, THYROIDAB in the last 72 hours. Anemia Panel: No results for input(s): VITAMINB12, FOLATE, FERRITIN, TIBC, IRON, RETICCTPCT in the last 72 hours. Sepsis Labs:  Recent Labs Lab 02/27/16 1720 02/27/16 2022  LATICACIDVEN 2.0* 3.5*    Recent Results (from the past 240 hour(s))  Blood culture (routine x 2)     Status: Abnormal   Collection Time: 02/27/16  5:06 PM  Result Value Ref Range Status   Specimen Description BLOOD LEFT ANTECUBITAL  Final   Special Requests BOTTLES DRAWN AEROBIC AND ANAEROBIC 10CC EACH  Final   Culture  Setup Time   Final    GRAM POSITIVE COCCI IN CLUSTERS RECOVERED FROM BOTH THE AEROBIC AND ANAEROBIC BOTTLES Gram Stain Report Called to,Read Back By and Verified With:  SCHONEWITZ,L. AT 0909  ON 02/28/2016 BY EVA Performed at [facility]    Culture METHICILLIN RESISTANT STAPHYLOCOCCUS AUREUS (A)  Final   Report Status 03/01/2016 FINAL  Final   Organism ID, Bacteria METHICILLIN RESISTANT STAPHYLOCOCCUS AUREUS  Final      Susceptibility   Methicillin resistant staphylococcus aureus - MIC*    CIPROFLOXACIN >=8 RESISTANT Resistant     ERYTHROMYCIN >=8 RESISTANT Resistant     GENTAMICIN <=0.5 SENSITIVE Sensitive     OXACILLIN >=4 RESISTANT Resistant     TETRACYCLINE <=1 SENSITIVE Sensitive     VANCOMYCIN <=0.5 SENSITIVE Sensitive     TRIMETH/SULFA <=10 SENSITIVE Sensitive     CLINDAMYCIN <=0.25 SENSITIVE Sensitive     RIFAMPIN <=0.5 SENSITIVE Sensitive     Inducible Clindamycin NEGATIVE Sensitive     * METHICILLIN RESISTANT STAPHYLOCOCCUS AUREUS  Blood Culture ID Panel (Reflexed)     Status: Abnormal   Collection Time: 02/27/16  5:06 PM  Result Value Ref Range Status   Enterococcus species NOT DETECTED NOT DETECTED Final   Listeria monocytogenes NOT DETECTED NOT DETECTED Final   Staphylococcus species DETECTED (A) NOT DETECTED Final    Comment: CRITICAL RESULT CALLED TO, READ BACK BY AND VERIFIED WITH: S. Hall Pharm.D. 14:25 02/28/16 ([email or web address])    Staphylococcus aureus DETECTED (A) NOT DETECTED Final    Comment: CRITICAL RESULT CALLED TO, READ BACK BY AND VERIFIED WITH: S. Hall Pharm.D. 14:25 02/28/16 ([email or web address])    Methicillin resistance DETECTED (A) NOT DETECTED Final    Comment: CRITICAL RESULT CALLED TO, READ BACK BY AND VERIFIED WITH: S. Hall Pharm.D. 14:25 02/28/16 ([email or web address])    Streptococcus species NOT DETECTED NOT DETECTED Final   Streptococcus agalactiae NOT DETECTED NOT DETECTED Final   Streptococcus pneumoniae NOT DETECTED NOT DETECTED Final   Streptococcus pyogenes NOT DETECTED NOT DETECTED Final   Acinetobacter baumannii NOT DETECTED NOT DETECTED Final   Enterobacteriaceae species NOT DETECTED NOT DETECTED Final   Enterobacter  cloacae complex NOT DETECTED NOT DETECTED Final   Escherichia coli NOT DETECTED NOT DETECTED Final   Klebsiella oxytoca NOT DETECTED NOT DETECTED Final   Klebsiella pneumoniae NOT DETECTED NOT DETECTED Final   Proteus species NOT DETECTED NOT DETECTED Final   Serratia marcescens  NOT DETECTED NOT DETECTED Final   Haemophilus influenzae NOT DETECTED NOT DETECTED Final   Neisseria meningitidis NOT DETECTED NOT DETECTED Final   Pseudomonas aeruginosa NOT DETECTED NOT DETECTED Final   Candida albicans NOT DETECTED NOT DETECTED Final   Candida glabrata NOT DETECTED NOT DETECTED Final   Candida krusei NOT DETECTED NOT DETECTED Final   Candida parapsilosis NOT DETECTED NOT DETECTED Final   Candida tropicalis NOT DETECTED NOT DETECTED Final    Comment: [facility]  Blood culture (routine x 2)     Status: Abnormal   Collection Time: 02/27/16  5:13 PM  Result Value Ref Range Status   Specimen Description BLOOD RIGHT ANTECUBITAL  Final   Special Requests BOTTLES DRAWN AEROBIC AND ANAEROBIC 5CC EACH  Final   Culture  Setup Time   Final    GRAM POSITIVE COCCI IN CLUSTERS RECOVERED FROM BOTH AEROBIC AND ANAEROEBIC BOTTLE Gram Stain Report Called to,Read Back By and Verified With: SCHONEWITZ, L. AT 0909 ON 02/28/2016 BY EVA    Culture (A)  Final    STAPHYLOCOCCUS AUREUS SUSCEPTIBILITIES PERFORMED ON PREVIOUS CULTURE WITHIN THE LAST 5 DAYS. Per[facility]    Report Status 03/01/2016 FINAL  Final  MRSA PCR Screening     Status: Abnormal   Collection Time: 02/27/16 10:00 PM  Result Value Ref Range Status   MRSA by PCR POSITIVE (A) NEGATIVE Final    Comment:        The GeneXpert MRSA Assay (FDA approved for NASAL specimens only), is one component of a comprehensive MRSA colonization surveillance program. It is not intended to diagnose MRSA infection nor to guide or monitor treatment for MRSA infections. RESULT CALLED TO, READ BACK BY AND VERIFIED  WITH: SPANGLER,E. AT 1030 ON 02/28/2016 BY BAUGHAM,M.   Culture, blood (Routine X 2) w Reflex to ID Panel     Status: None (Preliminary result)   Collection Time: 03/01/16  5:58 AM  Result Value Ref Range Status   Specimen Description BLOOD RIGHT HAND  Final   Special Requests BOTTLES DRAWN AEROBIC AND ANAEROBIC 4CC  Final   Culture NO GROWTH < 24 HOURS  Final   Report Status PENDING  Incomplete  Culture, blood (Routine X 2) w Reflex to ID Panel     Status: None (Preliminary result)   Collection Time: 03/01/16  5:58 AM  Result Value Ref Range Status   Specimen Description BLOOD RIGHT HAND  Final   Special Requests BOTTLES DRAWN AEROBIC AND ANAEROBIC 6CC  Final   Culture NO GROWTH < 24 HOURS  Final   Report Status PENDING  Incomplete     Radiology Studies: No results found.   Scheduled Meds: . aspirin EC  325 mg Oral Daily  . collagenase   Topical Daily  . FLUoxetine  20 mg Oral BID  . furosemide  40 mg Intravenous BID  . heparin  5,000 Units Subcutaneous Q8H  . insulin aspart  0-5 Units Subcutaneous QHS  . insulin aspart  0-9 Units Subcutaneous TID WC  . metoprolol tartrate  25 mg Oral BID  . mupirocin ointment  1 application Nasal BID  . mupirocin ointment   Topical TID  . pantoprazole  40 mg Oral Daily  . pravastatin  40 mg Oral QHS  . sodium chloride flush  3 mL Intravenous Q12H  . vancomycin  750 mg Intravenous Q24H   Continuous Infusions: . sodium chloride 100 mL/hr at 03/03/16 0036     LOS:  5 days   Time spent: 25 minutes  Erick Blinks, MD [facility] If 7PM-7AM, please contact night-coverage [email or web address] Password [email or web address] 03/03/2016, 8:08 AM

## 2016-03-03 NOTE — Progress Notes (Signed)
Subjective: Interval History: Patient presently does not have any complaints. No nausea or vomiting. Her appetite is good and no difficulty breathing  Objective: Vital signs in last 24 hours: Temp:  [98.9 F (37.2 C)] 98.9 F (37.2 C) (09/23 1522) Pulse Rate:  [82] 82 (09/23 1522) Resp:  [20] 20 (09/23 1522) BP: (136)/(71) 136/71 (09/23 1522) SpO2:  [95 %] 95 % (09/23 1522) Weight change:   Intake/Output from previous day: 09/23 0701 - 09/24 0700 In: -  Out: 1700 [Urine:1700] Intake/Output this shift: No intake/output data recorded.  General appearance: alert, cooperative and no distress Resp: clear to auscultation bilaterally Cardio: regular rate and rhythm GI: Abdomen is obese, nontender and difficult to palpate organomegaly Extremities: No edema  Lab Results:  Recent Labs  03/02/16 0805 03/03/16 0630  WBC 8.1 4.8  HGB 8.8* 8.0*  HCT 27.2* 24.5*  PLT 274 249   BMET:   Recent Labs  03/01/16 0547 03/03/16 0630  NA 132* 138  135  K 4.3 3.8  3.8  CL 103 110  111  CO2 23 19*  19*  GLUCOSE 117* 223*  223*  BUN 47* 42*  42*  CREATININE 2.26* 1.91*  1.91*  CALCIUM 8.2* 7.3*  7.3*   No results for input(s): PTH in the last 72 hours. Iron Studies: No results for input(s): IRON, TIBC, TRANSFERRIN, FERRITIN in the last 72 hours.  Studies/Results: No results found.  I have reviewed the patient's current medications.  Assessment/Plan: Problem #1 acute kidney injury:  Taught to be secondary to prerenal/ATN/ACE. Her renal function continued to improve. Patient presently non-oliguric. Problem #2 previous history of acute kidney injury requiring dialysis as an inpatient. Problem #3 anemia: Her hemoglobin is low. Possibly iron deficiency anemia. Iron studies pending Problem #4 hyperkalemia: Potassium has corrected Problem #5 history of hypertension: Her blood pressure is reasonably controlled Problem #6 diabetes: Her blood sugar is reasonably  controlled Problem #7 cellulitis: Presently she is a febrile. The patient is on antibiotics and her white blood cell count is normal. Plan: We'll continue his present management We'll check renal panel in the morning.   LOS: 5 days   [name] S 03/03/2016,8:10 AM

## 2016-03-04 LAB — GLUCOSE, CAPILLARY
Glucose-Capillary: 143 mg/dL — ABNORMAL HIGH (ref 65–99)
Glucose-Capillary: 122 mg/dL — ABNORMAL HIGH (ref 65–99)
Glucose-Capillary: 57 mg/dL — ABNORMAL LOW (ref 65–99)
Glucose-Capillary: 69 mg/dL (ref 65–99)
Glucose-Capillary: 67 mg/dL (ref 65–99)

## 2016-03-04 LAB — RENAL FUNCTION PANEL
Albumin: 1.8 g/dL — ABNORMAL LOW (ref 3.5–5.0)
Anion gap: 6 (ref 5–15)
BUN: 41 mg/dL — ABNORMAL HIGH (ref 6–20)
CHLORIDE: 106 mmol/L (ref 101–111)
CO2: 25 mmol/L (ref 22–32)
CREATININE: 1.94 mg/dL — AB (ref 0.44–1.00)
Calcium: 8.2 mg/dL — ABNORMAL LOW (ref 8.9–10.3)
GFR calc non Af Amer: 27 mL/min — ABNORMAL LOW (ref >60)
GFR, EST AFRICAN AMERICAN: 31 mL/min — AB (ref >60)
GLUCOSE: 160 mg/dL — AB (ref 65–99)
Phosphorus: 3.7 mg/dL (ref 2.5–4.6)
Potassium: 3.8 mmol/L (ref 3.5–5.1)
Sodium: 137 mmol/L (ref 135–145)

## 2016-03-04 LAB — CBC
HCT: 29.3 % — ABNORMAL LOW (ref 36.0–46.0)
HEMOGLOBIN: 9.6 g/dL — AB (ref 12.0–15.0)
MCH: 28.7 pg (ref 26.0–34.0)
MCHC: 32.8 g/dL (ref 30.0–36.0)
MCV: 87.7 fL (ref 78.0–100.0)
PLATELETS: 322 10*3/uL (ref 150–400)
RBC: 3.34 MIL/uL — AB (ref 3.87–5.11)
RDW: 15 % (ref 11.5–15.5)
WBC: 5.2 10*3/uL (ref 4.0–10.5)

## 2016-03-04 LAB — FERRITIN: FERRITIN: 413 ng/mL — AB (ref 11–307)

## 2016-03-04 LAB — IRON AND TIBC: IRON: 18 ug/dL — AB (ref 28–170)

## 2016-03-04 MED ORDER — INSULIN GLARGINE 100 UNIT/ML ~~LOC~~ SOLN
7.0000 [IU] | Freq: Every day | SUBCUTANEOUS | Status: DC
  Filled 2016-03-04 (×2): qty 0.07

## 2016-03-04 NOTE — Progress Notes (Addendum)
Subjective: Interval History: Patient is feeling much better and presently Schofer's no complaints.  Objective: Vital signs in last 24 hours: Temp:  [98.1 F (36.7 C)-98.5 F (36.9 C)] 98.1 F (36.7 C) (09/25 0743) Pulse Rate:  [82-85] 83 (09/25 0743) Resp:  [18-19] 18 (09/25 0455) BP: (130-155)/(64-75) 155/75 (09/25 0743) SpO2:  [94 %-99 %] 99 % (09/25 0743) Weight change:   Intake/Output from previous day: 09/24 0701 - 09/25 0700 In: -  Out: 2175 [Urine:2175] Intake/Output this shift: No intake/output data recorded.  General appearance: alert, cooperative and no distress Resp: clear to auscultation bilaterally Cardio: regular rate and rhythm GI: Abdomen is obese, nontender and difficult to palpate organomegaly Extremities: No edema  Lab Results:  Recent Labs  03/03/16 0630 03/04/16 0546  WBC 4.8 5.2  HGB 8.0* 9.6*  HCT 24.5* 29.3*  PLT 249 322   BMET:   Recent Labs  03/03/16 0630 03/04/16 0546  NA 138  135 137  K 3.8  3.8 3.8  CL 110  111 106  CO2 19*  19* 25  GLUCOSE 223*  223* 160*  BUN 42*  42* 41*  CREATININE 1.91*  1.91* 1.94*  CALCIUM 7.3*  7.3* 8.2*   No results for input(s): PTH in the last 72 hours. Iron Studies: No results for input(s): IRON, TIBC, TRANSFERRIN, FERRITIN in the last 72 hours.  Studies/Results: No results found.  I have reviewed the patient's current medications.  Assessment/Plan: Problem #1 acute kidney injury:  Taught to be secondary to prerenal/ATN/ACE. Her renal function is stable. She remained non-oliguric. Possibly her creatinine is her baseline. Problem #2 previous history of acute kidney injury requiring dialysis as an inpatient. Problem #3 anemia: Her hemoglobin is low. Possibly iron deficiency anemia. Presently iron saturation and ferritin is pending. Problem #4 hyperkalemia: Potassium has corrected Problem #5 history of hypertension: Her blood pressure is reasonably controlled Problem #6 diabetes: Her  blood sugar is reasonably controlled Problem #7 cellulitis/pressure sore: Presently she is a febrile. The patient is on antibiotics and her white blood cell count is improving. Problem #8 severe hypoalbuminemia most likely from poor nutrition. Plan: We'll continue his present management We'll check renal panel in the morning.   LOS: 6 days   Allena Pietila S 03/04/2016,8:43 AM

## 2016-03-04 NOTE — Progress Notes (Signed)
PROGRESS NOTE    [name]  ZOX:[id number] DOB: [birth year] DOA: 02/27/2016 PCP: [name], MD    Brief Narrative:  19 yof with a hx of HTN, HLD, DM type 2, and CKD stage 3 presented with complaints of worsening back and chest abscesses. While in the ED she was noted to be hyponatremic and hyperglycemic. She had an elevated BUN 45, Cr 2.33, and WBC 15,100. She was started on IV vancomycin and Meropenem. She was admitted for further evaluation.   Assessment & Plan:   Principal Problem:   Abscess of skin and subcutaneous tissue Active Problems:   Hypertension   HLD (hyperlipidemia)   CKD (chronic kidney disease), stage III   Paroxysmal SVT (supraventricular tachycardia) (HCC)   Hyperglycemia   Hyperosmolarity syndrome   Pressure injury of skin   MRSA bacteremia  1. Multiple Abcessesof skin and subcutaneous tissue. Blood cultures positive for MRSA. Continue IV Vancomycin. General surgery was consulted and felt she has no indication for surgical intervention at this time. Continue warm compresses and topical mupirocin. Continue to monitor.  2. MRSAbacteremia. Related to skin infection. Currently on vancomycin. Discussed with Dr. Ninetta Lights and recommendations were to check 2D echo and repeat cultures. Echo as below. She will needa two week course of Vancomycin. Repeat cultures have shown no growth to date. PICC line has been placed.  3. Hyperosmolarity syndrome. Blood sugars were markedly elevated on admission, but significantly improved with insulin infusion. She was transitioned to lantus and becamehypoglycemic. She briefly required adextrose infusion, which has since been discontinued. Currently on sliding scale insulin and lantus  4. DM type 2. Recent A1c is 11.9. Blood sugars likely elevated on admission due to infection/recent steroid use. Currently having episodes of hypoglycemia. Will decrease lantus dose to 7 units daily. Continue to monitor. 5. AKI. Creatinine was  normal on last admission. Currently elevated at 1.91 with a BUN of 42. Nephrology following. AKI felt to be related to ATN/ACE/pre-renal syndrome. She has been started on IVFs and IV lasix. Overall, she feels her anasarca is improving. Renal function is slowly improving. She is non-oliguric. Recheck labs in am. Continue to hold ace inhibitor. 6. HTN. Stable. Continue lopressor. 7. HLD. Continue statins. 8. GERD. Continue PPI.  DVT prophylaxis: Heparin  Code Status: Full  Family Communication: no family present Disposition Plan: discharge home with home health   Consultants:   Nephrology   General surgery   Procedures:   PICC line 9/22   Echo  -Left ventricle: The cavity size was normal. Wall thickness was increased in a pattern of moderate LVH. Systolic function was normal. The estimated ejection fraction was in the range of 55% to 60%. Wall motion was normal; there were no regional wall motion abnormalities. Doppler parameters are consistent with abnormal left ventricular relaxation (grade 1 diastolic dysfunction). - Aortic valve: Mildly calcified annulus. Trileaflet; mildly thickened leaflets. Valve area (VTI): 1.81 cm^2. Valve area (Vmax): 1.85 cm^2. Valve area (Vmean): 1.64 cm^2. - Mitral valve: Mildly calcified annulus. Mildly thickened leaflets. - Right atrium: The atrium was mildly dilated. - Atrial septum: No defect or patent foramen ovale was identified. - Pulmonary arteries: Systolic pressure was moderately increased. PA peak pressure: 42 mm Hg (S). - Pericardium, extracardiac: There is a large left pleural effusion. There is a small circumferential pericaridal effusion. - Technically adequate study.  Antimicrobials:   Vancomycin 9/20 >>   Subjective: Feeling better. Feels swelling is improving. No shortness of breath. Feels wounds are improving  Objective: Vitals:  03/02/16 0500 03/02/16 1522 03/03/16 2106 03/04/16 0455  BP: 124/78  136/71 130/71 (!) 155/64  Pulse: 89 82 85 82  Resp: 18 20 19 18   Temp: 98.7 F (37.1 C) 98.9 F (37.2 C) 98.3 F (36.8 C) 98.5 F (36.9 C)  TempSrc: Oral Oral Oral Oral  SpO2: 94% 95% 96% 94%  Weight:      Height:        Intake/Output Summary (Last 24 hours) at 03/04/16 0706 Last data filed at 03/04/16 0600  Gross per 24 hour  Intake                0 ml  Output             2175 ml  Net            -2175 ml   Filed Weights   02/28/16 0500 02/29/16 0500 03/01/16 0500  Weight: 93.1 kg (205 lb 4 oz) 97.4 kg (214 lb 11.7 oz) 97.7 kg (215 lb 6.2 oz)    Examination:  General exam: Appears calm and comfortable  Respiratory system: Clear to auscultation. Respiratory effort normal. Cardiovascular system: S1 & S2 heard, RRR. No JVD, murmurs, rubs, gallops or clicks. No pedal edema. Gastrointestinal system: Abdomen is nondistended, soft and nontender. No organomegaly or masses felt. Normal bowel sounds heard. Central nervous system: Alert and oriented. No focal neurological deficits. Extremities: Symmetric 5 x 5 power. Psychiatry: Judgement and insight appear normal. Mood & affect appropriate.     Data Reviewed: I have personally reviewed following labs and imaging studies  CBC:  Recent Labs Lab 02/27/16 1650  02/29/16 0430 03/01/16 0547 03/02/16 0805 03/03/16 0630 03/04/16 0546  WBC 15.4*  < > 20.4* 12.2* 8.1 4.8 5.2  NEUTROABS 14.2*  --   --   --   --   --   --   HGB 10.8*  < > 11.0* 9.9* 8.8* 8.0* 9.6*  HCT 33.9*  < > 33.2* 29.4* 27.2* 24.5* 29.3*  MCV 92.6  < > 88.3 87.8 88.9 88.1 87.7  PLT 345  < > 311 301 274 249 322  < > = values in this interval not displayed. Basic Metabolic Panel:  Recent Labs Lab 02/29/16 0430 02/29/16 1638 03/01/16 0547 03/03/16 0630 03/04/16 0546  NA 134* 132* 132* 138  135 137  K 4.2 5.5* 4.3 3.8  3.8 3.8  CL 105 107 103 110  111 106  CO2 23 17* 23 19*  19* 25  GLUCOSE 91 42* 117* 223*  223* 160*  BUN 44* 49* 47* 42*  42*  41*  CREATININE 2.08* 2.43* 2.26* 1.91*  1.91* 1.94*  CALCIUM 8.0* 8.3* 8.2* 7.3*  7.3* 8.2*  MG 1.5*  --   --   --   --   PHOS  --   --   --  3.3 3.7   GFR: Estimated Creatinine Clearance: 34.3 mL/min (by C-G formula based on SCr of 1.94 mg/dL (H)). Liver Function Tests:  Recent Labs Lab 02/27/16 1650 03/03/16 0630 03/04/16 0546  AST 15  --   --   ALT 16  --   --   ALKPHOS 160*  --   --   BILITOT 0.6  --   --   PROT 6.9  --   --   ALBUMIN 1.9* 1.4* 1.8*   No results for input(s): LIPASE, AMYLASE in the last 168 hours. No results for input(s): AMMONIA in the last 168 hours. Coagulation Profile:  Recent  Labs Lab 02/27/16 1650  INR 1.16   Cardiac Enzymes: No results for input(s): CKTOTAL, CKMB, CKMBINDEX, TROPONINI in the last 168 hours. BNP (last 3 results) No results for input(s): PROBNP in the last 8760 hours. HbA1C: No results for input(s): HGBA1C in the last 72 hours. CBG:  Recent Labs Lab 03/02/16 1649 03/02/16 2126 03/03/16 0725 03/03/16 1131 03/03/16 2108  GLUCAP 110* 151* 222* 247* 189*   Lipid Profile: No results for input(s): CHOL, HDL, LDLCALC, TRIG, CHOLHDL, LDLDIRECT in the last 72 hours. Thyroid Function Tests: No results for input(s): TSH, T4TOTAL, FREET4, T3FREE, THYROIDAB in the last 72 hours. Anemia Panel: No results for input(s): VITAMINB12, FOLATE, FERRITIN, TIBC, IRON, RETICCTPCT in the last 72 hours. Sepsis Labs:  Recent Labs Lab 02/27/16 1720 02/27/16 2022  LATICACIDVEN 2.0* 3.5*    Recent Results (from the past 240 hour(s))  Blood culture (routine x 2)     Status: Abnormal   Collection Time: 02/27/16  5:06 PM  Result Value Ref Range Status   Specimen Description BLOOD LEFT ANTECUBITAL  Final   Special Requests BOTTLES DRAWN AEROBIC AND ANAEROBIC 10CC EACH  Final   Culture  Setup Time   Final    GRAM POSITIVE COCCI IN CLUSTERS RECOVERED FROM BOTH THE AEROBIC AND ANAEROBIC BOTTLES Gram Stain Report Called to,Read Back By  and Verified With: SCHONEWITZ,L. AT 0909  ON 02/28/2016 BY EVA Performed at [facility]    Culture METHICILLIN RESISTANT STAPHYLOCOCCUS AUREUS (A)  Final   Report Status 03/01/2016 FINAL  Final   Organism ID, Bacteria METHICILLIN RESISTANT STAPHYLOCOCCUS AUREUS  Final      Susceptibility   Methicillin resistant staphylococcus aureus - MIC*    CIPROFLOXACIN >=8 RESISTANT Resistant     ERYTHROMYCIN >=8 RESISTANT Resistant     GENTAMICIN <=0.5 SENSITIVE Sensitive     OXACILLIN >=4 RESISTANT Resistant     TETRACYCLINE <=1 SENSITIVE Sensitive     VANCOMYCIN <=0.5 SENSITIVE Sensitive     TRIMETH/SULFA <=10 SENSITIVE Sensitive     CLINDAMYCIN <=0.25 SENSITIVE Sensitive     RIFAMPIN <=0.5 SENSITIVE Sensitive     Inducible Clindamycin NEGATIVE Sensitive     * METHICILLIN RESISTANT STAPHYLOCOCCUS AUREUS  Blood Culture ID Panel (Reflexed)     Status: Abnormal   Collection Time: 02/27/16  5:06 PM  Result Value Ref Range Status   Enterococcus species NOT DETECTED NOT DETECTED Final   Listeria monocytogenes NOT DETECTED NOT DETECTED Final   Staphylococcus species DETECTED (A) NOT DETECTED Final    Comment: CRITICAL RESULT CALLED TO, READ BACK BY AND VERIFIED WITH: S. Hall Pharm.D. 14:25 02/28/16 ([email or web address])    Staphylococcus aureus DETECTED (A) NOT DETECTED Final    Comment: CRITICAL RESULT CALLED TO, READ BACK BY AND VERIFIED WITH: S. Hall Pharm.D. 14:25 02/28/16 ([email or web address])    Methicillin resistance DETECTED (A) NOT DETECTED Final    Comment: CRITICAL RESULT CALLED TO, READ BACK BY AND VERIFIED WITH: S. Hall Pharm.D. 14:25 02/28/16 ([email or web address])    Streptococcus species NOT DETECTED NOT DETECTED Final   Streptococcus agalactiae NOT DETECTED NOT DETECTED Final   Streptococcus pneumoniae NOT DETECTED NOT DETECTED Final   Streptococcus pyogenes NOT DETECTED NOT DETECTED Final   Acinetobacter baumannii NOT DETECTED NOT DETECTED Final   Enterobacteriaceae species NOT DETECTED NOT DETECTED Final     Enterobacter cloacae complex NOT DETECTED NOT DETECTED Final   Escherichia coli NOT DETECTED NOT DETECTED Final   Klebsiella oxytoca NOT DETECTED NOT DETECTED Final   Klebsiella pneumoniae NOT  DETECTED NOT DETECTED Final   Proteus species NOT DETECTED NOT DETECTED Final   Serratia marcescens NOT DETECTED NOT DETECTED Final   Haemophilus influenzae NOT DETECTED NOT DETECTED Final   Neisseria meningitidis NOT DETECTED NOT DETECTED Final   Pseudomonas aeruginosa NOT DETECTED NOT DETECTED Final   Candida albicans NOT DETECTED NOT DETECTED Final   Candida glabrata NOT DETECTED NOT DETECTED Final   Candida krusei NOT DETECTED NOT DETECTED Final   Candida parapsilosis NOT DETECTED NOT DETECTED Final   Candida tropicalis NOT DETECTED NOT DETECTED Final    Comment: Performed at [facility]  Blood culture (routine x 2)     Status: Abnormal   Collection Time: 02/27/16  5:13 PM  Result Value Ref Range Status   Specimen Description BLOOD RIGHT ANTECUBITAL  Final   Special Requests BOTTLES DRAWN AEROBIC AND ANAEROBIC 5CC EACH  Final   Culture  Setup Time   Final    GRAM POSITIVE COCCI IN CLUSTERS RECOVERED FROM BOTH AEROBIC AND ANAEROEBIC BOTTLE Gram Stain Report Called to,Read Back By and Verified With: SCHONEWITZ, L. AT 0909 ON 02/28/2016 BY EVA    Culture (A)  Final    STAPHYLOCOCCUS AUREUS SUSCEPTIBILITIES PERFORMED ON PREVIOUS CULTURE WITHIN THE LAST 5 DAYS. Performed at [facility]    Report Status 03/01/2016 FINAL  Final  MRSA PCR Screening     Status: Abnormal   Collection Time: 02/27/16 10:00 PM  Result Value Ref Range Status   MRSA by PCR POSITIVE (A) NEGATIVE Final    Comment:        The GeneXpert MRSA Assay (FDA approved for NASAL specimens only), is one component of a comprehensive MRSA colonization surveillance program. It is not intended to diagnose MRSA infection nor to guide or monitor treatment for MRSA infections. RESULT CALLED TO, READ BACK BY AND  VERIFIED WITH: SPANGLER,E. AT 1030 ON 02/28/2016 BY BAUGHAM,M.   Culture, blood (Routine X 2) w Reflex to ID Panel     Status: None (Preliminary result)   Collection Time: 03/01/16  5:58 AM  Result Value Ref Range Status   Specimen Description BLOOD RIGHT HAND  Final   Special Requests BOTTLES DRAWN AEROBIC AND ANAEROBIC 4CC  Final   Culture NO GROWTH 2 DAYS  Final   Report Status PENDING  Incomplete  Culture, blood (Routine X 2) w Reflex to ID Panel     Status: None (Preliminary result)   Collection Time: 03/01/16  5:58 AM  Result Value Ref Range Status   Specimen Description BLOOD RIGHT HAND  Final   Special Requests BOTTLES DRAWN AEROBIC AND ANAEROBIC 6CC  Final   Culture NO GROWTH 2 DAYS  Final   Report Status PENDING  Incomplete         Radiology Studies: No results found.      Scheduled Meds: . aspirin EC  325 mg Oral Daily  . collagenase   Topical Daily  . FLUoxetine  20 mg Oral BID  . furosemide  40 mg Intravenous BID  . heparin  5,000 Units Subcutaneous Q8H  . insulin aspart  0-5 Units Subcutaneous QHS  . insulin aspart  0-9 Units Subcutaneous TID WC  . insulin glargine  10 Units Subcutaneous Daily  . metoprolol tartrate  25 mg Oral BID  . mupirocin ointment   Topical TID  . pantoprazole  40 mg Oral Daily  . pravastatin  40 mg Oral QHS  . sodium chloride flush  3 mL Intravenous Q12H  . vancomycin  750 mg Intravenous Q24H   Continuous Infusions: . sodium chloride 100 mL/hr at 03/03/16 2253     LOS: 6 days    Time spent: 25 minutes     Erick BlinksJehanzeb Analysia Dungee, MD [facility] If 7PM-7AM, please contact night-coverage [email or web address] Password [email or web address] 03/04/2016, 7:06 AM

## 2016-03-04 NOTE — Clinical Social Work Note (Signed)
[facility] [employer] [facility]. Pt, CM, and MD aware. Pt did not appear concerned and states that her boyfriend can assist at home. She states she has had wound care and IV antibiotics in the past at home through home health. Pt said she has been getting up with staff and feels she is doing okay with ambulating.   Derenda FennelKara Kayston Jodoin, LCSW [phone]

## 2016-03-04 NOTE — Progress Notes (Signed)
Pharmacy Antibiotic Note  [name] is a 60 y.o. female admitted on 02/27/2016 with cellulitis.  Pharmacy has been consulted for VANCOMYCIN dosing.  Pt with progressively worsened back and chest abscesses.  Pt has reportedly been on dialysis in the past.  Blood cultures (+) > staph aureus, MRSA detected  PPlan:  Continue Vancomycin to 750 mg IV every 24 hours Repeat VT in 2-3 days.  Monitor labs, micro and vitals.  Height: 5\' 3"  (160 cm) Weight: 215 lb 6.2 oz (97.7 kg) IBW/kg (Calculated) : 52.4  Temp (24hrs), Avg:98.3 F (36.8 C), Min:98.1 F (36.7 C), Max:98.5 F (36.9 C)   Recent Labs Lab 02/27/16 1720  02/27/16 2022  02/29/16 0430 02/29/16 1638 03/01/16 0547 03/01/16 1821 03/02/16 0805 03/03/16 0630 03/04/16 0546  WBC  --   --   --   < > 20.4*  --  12.2*  --  8.1 4.8 5.2  CREATININE  --   < > 2.72*  < > 2.08* 2.43* 2.26*  --   --  1.91*  1.91* 1.94*  LATICACIDVEN 2.0*  --  3.5*  --   --   --   --   --   --   --   --   VANCOTROUGH  --   --   --   --   --   --   --  24*  --   --   --   < > = values in this interval not displayed.  Estimated Creatinine Clearance: 34.3 mL/min (by C-G formula based on SCr of 1.94 mg/dL (H)).    Allergies  Allergen Reactions  . Other     Per pt some type of mycin "some antibiotic sent me into renal failure."  . Penicillins Rash    Tolerated Zosyn in 07/2015 Has patient had a PCN reaction causing immediate rash, facial/tongue/throat swelling, SOB or lightheadedness with hypotension: No Has patient had a PCN reaction causing severe rash involving mucus membranes or skin necrosis: No Has patient had a PCN reaction that required hospitalization No Has patient had a PCN reaction occurring within the last 10 years: No If all of the above answers are "NO", then may proceed with Cephalosporin use.    Antimicrobials this admission: Vancomycin 9/19 >>  Meropenem 9/19 >> 9/20  Dose adjustments this admission: Vancomycin reduced to  750 mg every 24 hours starting 03/03/16 at 2 AM  Micro Data: 9/19 BCx: MRSA 9/19 MRSA PCR: (+) 9/22 BCx: Pending  Thank you for allowing pharmacy to be a part of this patient's care.  Mady GemmaHayes, Rudolpho Claxton R 03/04/2016 12:29 PM

## 2016-03-04 NOTE — Clinical Social Work Note (Signed)
[facility] Awaiting authorization. Possible d/c tomorrow per MD.   Derenda FennelKara Avalynn Bowe, LCSW [phone]

## 2016-03-05 LAB — BASIC METABOLIC PANEL
ANION GAP: 5 (ref 5–15)
BUN: 34 mg/dL — ABNORMAL HIGH (ref 6–20)
CHLORIDE: 112 mmol/L — AB (ref 101–111)
CO2: 22 mmol/L (ref 22–32)
Calcium: 7.2 mg/dL — ABNORMAL LOW (ref 8.9–10.3)
Creatinine, Ser: 1.53 mg/dL — ABNORMAL HIGH (ref 0.44–1.00)
GFR calc Af Amer: 42 mL/min — ABNORMAL LOW (ref >60)
GFR, EST NON AFRICAN AMERICAN: 36 mL/min — AB (ref >60)
GLUCOSE: 117 mg/dL — AB (ref 65–99)
POTASSIUM: 3.2 mmol/L — AB (ref 3.5–5.1)
Sodium: 139 mmol/L (ref 135–145)

## 2016-03-05 LAB — GLUCOSE, CAPILLARY
GLUCOSE-CAPILLARY: 104 mg/dL — AB (ref 65–99)
GLUCOSE-CAPILLARY: 91 mg/dL (ref 65–99)
Glucose-Capillary: 128 mg/dL — ABNORMAL HIGH (ref 65–99)

## 2016-03-05 MED ORDER — INSULIN GLARGINE 100 UNIT/ML ~~LOC~~ SOLN: 7.0000 [IU] | Freq: Every day | SUBCUTANEOUS | 11 refills | Status: AC

## 2016-03-05 MED ORDER — POLYSACCHARIDE IRON COMPLEX 150 MG PO CAPS
150.0000 mg | ORAL_CAPSULE | Freq: Two times a day (BID) | ORAL | Status: DC
  Administered 2016-03-05: 150 mg via ORAL
  Filled 2016-03-05: qty 1

## 2016-03-05 MED ORDER — TORSEMIDE 20 MG PO TABS: 20.0000 mg | ORAL_TABLET | Freq: Every day | ORAL | 0 refills | Status: DC | PRN

## 2016-03-05 MED ORDER — MUPIROCIN 2 % EX OINT: TOPICAL_OINTMENT | Freq: Three times a day (TID) | CUTANEOUS | 0 refills | Status: AC

## 2016-03-05 MED ORDER — POTASSIUM CHLORIDE CRYS ER 20 MEQ PO TBCR
40.0000 meq | EXTENDED_RELEASE_TABLET | Freq: Once | ORAL | Status: AC
  Administered 2016-03-05: 40 meq via ORAL
  Filled 2016-03-05: qty 2

## 2016-03-05 MED ORDER — VANCOMYCIN HCL IN DEXTROSE 750-5 MG/150ML-% IV SOLN: 750.0000 mg | INTRAVENOUS | Status: DC

## 2016-03-05 MED ORDER — POLYSACCHARIDE IRON COMPLEX 150 MG PO CAPS: 150.0000 mg | ORAL_CAPSULE | Freq: Two times a day (BID) | ORAL | 0 refills | Status: AC

## 2016-03-05 NOTE — Clinical Social Work Note (Signed)
Pt plans to return home with home health. CSW will sign off, but can be reconsulted if needed.  Derenda FennelKara Antwan Pandya, LCSW [phone]

## 2016-03-05 NOTE — Progress Notes (Addendum)
Subjective: Interval History: Patient is feeling much better and offers no complaint  Objective: Vital signs in last 24 hours: Temp:  [97.7 F (36.5 C)-98.2 F (36.8 C)] 97.7 F (36.5 C) (09/26 0543) Pulse Rate:  [84-86] 84 (09/26 0543) Resp:  [18] 18 (09/26 0543) BP: (133-149)/(62-73) 133/62 (09/26 0543) SpO2:  [98 %-100 %] 100 % (09/26 0543) Weight change:   Intake/Output from previous day: 09/25 0701 - 09/26 0700 In: 2073 [P.O.:720; I.V.:1203; IV Piggyback:150] Out: 1250 [Urine:1250] Intake/Output this shift: No intake/output data recorded.  General appearance: alert, cooperative and no distress Resp: clear to auscultation bilaterally Cardio: regular rate and rhythm GI: Abdomen is obese, nontender and difficult to palpate organomegaly Extremities: No edema  Lab Results:  Recent Labs  03/03/16 0630 03/04/16 0546  WBC 4.8 5.2  HGB 8.0* 9.6*  HCT 24.5* 29.3*  PLT 249 322   BMET:   Recent Labs  03/04/16 0546 03/05/16 0622  NA 137 139  K 3.8 3.2*  CL 106 112*  CO2 25 22  GLUCOSE 160* 117*  BUN 41* 34*  CREATININE 1.94* 1.53*  CALCIUM 8.2* 7.2*   No results for input(s): PTH in the last 72 hours. Iron Studies:   Recent Labs  03/03/16 0630  IRON 18*  TIBC NOT CALCULATED  FERRITIN 413*    Studies/Results: No results found.  I have reviewed the patient's current medications.  Assessment/Plan: Problem #1 acute kidney injury:  Her renal function continue to improve . She is none oliguric with urine out put of 1200 cc/over 24 hours Problem #2 previous history of acute kidney injury requiring dialysis as an inpatient. Problem #3 anemia: Her hemoglobin is low. Possibly iron deficiency anemia. Problem #4 hyperkalemia: Potassium has declined possibly from lasix Problem #5 history of hypertension: Her blood pressure is reasonably controlled Problem #6 diabetes: Her blood sugar is reasonably controlled Problem #7 cellulitis/pressure sore: Presently she  is a febrile. The patient is on antibiotics and her white blood cell count is improving. Problem #8 severe hypoalbuminemia most likely from poor nutrition. Plan: 1]D/C IVF and lasix 2]Kcl 40 meq po one dose 3]We'll check renal panel in the morning. 4]Nu-iron 150 mg po bid   LOS: 7 days   [name] S 03/05/2016,8:11 AM

## 2016-03-05 NOTE — Discharge Summary (Signed)
Physician Discharge Summary  [name]:[id number] DOB: 1955/07/19 DOA: 02/27/2016  PCP: Sophia Boston, MD  Admit date: 02/27/2016 Discharge date: 03/05/2016  Admitted From: Home Disposition:  Home  Recommendations for Outpatient Follow-up:  1. Follow up with PCP in 1-2 weeks 2. Please obtain BMP/CBC in one week 3. Patient will be discharged home with home health RN for wound care and intravenous antibiotics  Home Health:Home health RN Equipment/Devices:  Discharge Condition:Stable CODE STATUS:Full Diet recommendation: Heart healthy/diabetic diet  Brief/Interim Summary: 60 yof with a hx of HTN, HLD, DM type 2, and CKD stage 3 presented with complaints of worsening back and chest abscesses. While in the ED she was noted to be hyponatremic and hyperglycemic. She had an elevated BUN 45, Cr 2.33, and WBC 15,100. She was started on IV vancomycin and Meropenem. She was admitted for further evaluation.   Discharge Diagnoses:  Principal Problem:   Abscess of skin and subcutaneous tissue Active Problems:   Hypertension   HLD (hyperlipidemia)   CKD (chronic kidney disease), stage III   Paroxysmal SVT (supraventricular tachycardia) (HCC)   Hyperglycemia   Hyperosmolarity syndrome   Pressure injury of skin   MRSA bacteremia  1. Multiple Abcessesof skin and subcutaneous tissue. General surgery was consulted and felt  that since she did not have any areas of fluctuance, she did not have any indication for surgical intervention she was treated with intravenous antibiotics, warm compresses and topical mupirocin. Her lesions have started to improve  2. MRSAbacteremia. Related to skin infection.Patient was treated with vancomycin. Discussed with Dr. Ninetta Lights and recommendations were to check 2D echo and repeat cultures. Echodid not show any evidence of vegetations.plans to complete a total of 2 weeks of vancomycin which will be done on 03/15/16.  3. Hyperosmolarity syndrome. Blood  sugars were markedly elevated on admission, but significantly improved with insulin infusion. She was transitioned to lantus and becamehypoglycemic. She briefly required adextrose infusion, which has since been discontinued.Blood sugars have remained relatively stable on a lower dose of Lantus.  4. DM type 2. Recent A1c is 11.9. Blood sugars likely elevated on admission due to infection/recent steroid use.Blood sugar stabilized on lower dose of Lantus  5. AKI. Creatinine was normal on last admission.During her admission, creatinine increased to greater than 2. She was seen by nephrology who treated her with intravenous fluids and intravenous Lasix. Urine output has been good. Her creatinine has improved back to 1.5. Discussed with Dr. Kristian Covey and he recommends discharging the patient on Demadex 20 mg daily as needed since she continues to have some degree of swelling. Continue to hold ace inhibitor. 6. HTN. Stable. Continue lopressor. 7. HLD. Continue statins. 8. GERD. Continue PPI.  Discharge Instructions  Discharge Instructions    Diet - low sodium heart healthy    Complete by:  As directed    Face-to-face encounter (required for Medicare/Medicaid patients)    Complete by:  As directed    I MEMON,JEHANZEB certify that this patient is under my care and that I, or a nurse practitioner or physician's assistant working with me, had a face-to-face encounter that meets the physician face-to-face encounter requirements with this patient on 03/05/2016. The encounter with the patient was in whole, or in part for the following medical condition(s) which is the primary reason for home health care (List medical condition): wound care, home IV antibiotics   The encounter with the patient was in whole, or in part, for the following medical condition, which is the primary reason  for home health care:  wound care, home IV antibiotics   I certify that, based on my findings, the following services are medically  necessary home health services:  Nursing   Reason for Medically Necessary Home Health Services:  Skilled Nursing- Assessment and Observation of Wound Status   My clinical findings support the need for the above services:  Unable to leave home safely without assistance and/or assistive device   Further, I certify that my clinical findings support that this patient is homebound due to:  Unable to leave home safely without assistance   Home Health    Complete by:  As directed    To provide the following care/treatments:  RN   Increase activity slowly    Complete by:  As directed        Medication List    STOP taking these medications   ciprofloxacin 500 MG tablet Commonly known as:  CIPRO   clobetasol cream 0.05 % Commonly known as:  TEMOVATE   lisinopril 20 MG tablet Commonly known as:  PRINIVIL,ZESTRIL   predniSONE 20 MG tablet Commonly known as:  DELTASONE     TAKE these medications   aspirin EC 325 MG tablet Take 325 mg by mouth daily.   collagenase ointment Commonly known as:  SANTYL Apply in a nickel thickness to left achilles wound in the wound bed only, being careful not to get on surrounding skin.  Cover with dry gauze.   FLUoxetine 20 MG capsule Commonly known as:  PROZAC Take 20 mg by mouth 2 (two) times daily.   HYDROmorphone 4 MG tablet Commonly known as:  DILAUDID Take 4 mg by mouth 2 (two) times daily as needed. For pain   insulin glargine 100 UNIT/ML injection Commonly known as:  LANTUS Inject 0.07 mLs (7 Units total) into the skin daily. What changed:  how much to take  when to take this   iron polysaccharides 150 MG capsule Commonly known as:  NIFEREX Take 1 capsule (150 mg total) by mouth 2 (two) times daily.   metoprolol tartrate 25 MG tablet Commonly known as:  LOPRESSOR Take 1 tablet (25 mg total) by mouth 2 (two) times daily.   multivitamin with minerals Tabs tablet Take 1 tablet by mouth daily.   mupirocin ointment 2 % Commonly  known as:  BACTROBAN Apply topically 3 (three) times daily.   NOVOLOG FLEXPEN 100 UNIT/ML FlexPen Generic drug:  insulin aspart Inject 3 Units into the skin 3 (three) times daily with meals.   pantoprazole 40 MG tablet Commonly known as:  PROTONIX Take 1 tablet (40 mg total) by mouth daily.   pravastatin 40 MG tablet Commonly known as:  PRAVACHOL Take 40 mg by mouth at bedtime.   PROAIR HFA 108 (90 Base) MCG/ACT inhaler Generic drug:  albuterol Inhale 2 puffs into the lungs every 6 (six) hours as needed for wheezing or shortness of breath.   QUEtiapine 25 MG tablet Commonly known as:  SEROQUEL Take 1 tablet by mouth at bedtime.   torsemide 20 MG tablet Commonly known as:  DEMADEX Take 1 tablet (20 mg total) by mouth daily as needed (swelling).   Vancomycin 750-5 MG/150ML-% Soln Commonly known as:  VANCOCIN Inject 150 mLs (750 mg total) into the vein daily. Until 10/6 Start taking on:  03/06/2016      Follow-up Information    [facility] S, MD Follow up in 4 week(s).   Specialty:  Nephrology Contact information: [address] [place] [address] [phone]  Allergies  Allergen Reactions  . Other     Per pt some type of mycin "some antibiotic sent me into renal failure."  . Penicillins Rash    Tolerated Zosyn in 07/2015 Has patient had a PCN reaction causing immediate rash, facial/tongue/throat swelling, SOB or lightheadedness with hypotension: No Has patient had a PCN reaction causing severe rash involving mucus membranes or skin necrosis: No Has patient had a PCN reaction that required hospitalization No Has patient had a PCN reaction occurring within the last 10 years: No If all of the above answers are "NO", then may proceed with Cephalosporin use.     Consultations:  Gen. surgery  Infectious disease, Dr. Ninetta Lights (phone)  Nephrology   Procedures/Studies: Ct Abdomen Pelvis Wo Contrast  Result Date: 02/27/2016 CLINICAL  DATA:  Multiple painful abscesses throughout the body. EXAM: CT ABDOMEN AND PELVIS WITHOUT CONTRAST TECHNIQUE: Multidetector CT imaging of the abdomen and pelvis was performed following the standard protocol without IV contrast. COMPARISON:  02/17/2016 FINDINGS: Hepatobiliary: Liver appears unremarkable without contrast. Pancreas: Negative Spleen: Normal Adrenals/Urinary Tract: Adrenal glands are normal. Renal margins appear slightly indistinct which could go along with nephritis. No evidence of mass, abscess or obstruction. Stomach/Bowel: No primary bowel pathology. Ventral hernia which contains fat and a segment of transverse colon. Vascular/Lymphatic: Aortic atherosclerosis. No aneurysm. IVC is normal. No retroperitoneal mass or lymphadenopathy. Reproductive: Uterus and adnexal regions appear unremarkable. Other: No ascites or free air. Musculoskeletal: Ordinary degenerative changes affect the lumbar spine. There are areas of edema within the subcutaneous fat of the abdomen which could be nonspecific edema or areas of inflammation. IMPRESSION: Indistinct renal margins which could go along with nephritis. No evidence of abscess or obstruction. Ventral hernia containing fat and a segment of transverse colon. No obstruction. Edema pattern within the subcutaneous tissues which could be due to simple edema or inflammatory edema. Electronically Signed   By: Paulina Fusi M.D.   On: 02/27/2016 19:26   Dg Chest 2 View  Result Date: 02/18/2016 CLINICAL DATA:  Vomiting. EXAM: CHEST  2 VIEW COMPARISON:  Multiple chest x-rays since November 15, 2015 FINDINGS: Cardiomegaly. No pneumothorax. The hila and mediastinum are unchanged. There is increased opacity in the medial right lung base suspicious for developing infiltrate. Mild increased interstitial markings without overt edema. No other acute abnormalities. IMPRESSION: Opacity in the medial right lung base on the frontal view, noted to be in the middle lobe on the lateral  view. There is also some patchy opacity in the left base. The findings are suspicious for multi focal infiltrate. Recommend follow-up to resolution. Pulmonary venous congestion without overt edema. Electronically Signed   By: Gerome Sam III M.D   On: 02/18/2016 10:26   Ct Head Wo Contrast  Result Date: 02/27/2016 CLINICAL DATA:  Painful abscesses of the back and neck. Elevated creatinine. EXAM: CT HEAD WITHOUT CONTRAST TECHNIQUE: Contiguous axial images were obtained from the base of the skull through the vertex without intravenous contrast. COMPARISON:  None. FINDINGS: Brain: Normal appearance of the brain without evidence of old or acute infarction, mass lesion, hemorrhage, hydrocephalus or extra-axial collection. Vascular: There is atherosclerotic calcification of the major vessels at the base of the brain. Skull: Normal Sinuses/Orbits: Normal Other: Few areas of swelling of the scalp which could be infectious inflammation. No large or obviously drainable scalp collection. IMPRESSION: Multiple areas of density in the scalp consistent with foci of soft tissue inflammation. None of these appear large or definitely low-density to suggest drainable collection.  No intracranial abnormality. Electronically Signed   By: Paulina FusiMark  Shogry M.D.   On: 02/27/2016 19:17   Ct Chest Wo Contrast  Result Date: 02/27/2016 CLINICAL DATA:  Multiple painful abscesses of the back and neck. Pneumonia on the previous chest radiograph. EXAM: CT CHEST WITHOUT CONTRAST TECHNIQUE: Multidetector CT imaging of the chest was performed following the standard protocol without IV contrast. COMPARISON:  Chest radiography 02/18/2016. FINDINGS: Cardiovascular: Coronary artery calcification. Aortic atherosclerosis without evidence of aneurysm. Mediastinum/Nodes: No mediastinal mass or lymphadenopathy discernible without contrast. Lungs/Pleura: Bilateral pleural effusions layering dependently Coup left more than right. Mild dependent  atelectasis in the right lung. Some patchy atelectasis or scarring anteriorly in the right upper lobe. More considerable density in the left lower lobe that could be a combination of volume loss and left lower lobe pneumonia. Upper Abdomen: See results of abdominal scan. Musculoskeletal: Thoracic spine is negative. There are multiple areas of abnormal soft tissue density within the subcutaneous fat of the thorax consistent with soft tissue infection as described clinically. Largest area is in the left lower lateral chest. IMPRESSION: Small effusions left more than right. Areas of pulmonary atelectasis left more than right. Possibility exists that there could be some patchy pneumonia, particularly in the left lower lobe. Aortic atherosclerosis.  Coronary artery atherosclerosis. Multiple areas of soft tissue density within the subcutaneous fat of the chest consistent with multiple superficial soft tissue infections as described clinically. Electronically Signed   By: Paulina FusiMark  Shogry M.D.   On: 02/27/2016 19:21   [place] Abdomen Complete  Result Date: 02/18/2016 CLINICAL DATA:  Increased LFTs. EXAM: ABDOMEN ULTRASOUND COMPLETE COMPARISON:  CT scan from yesterday. FINDINGS: Gallbladder: Imaging of the gallbladder suggests a wall echo shadow sign seen in cases of contracted gallbladder around intraluminal stones. This precludes reliable measurement of gallbladder wall thickness, but sonographer estimates 2-3 mm. Common bile duct: Diameter: 7 mm Liver: No focal lesion identified. Within normal limits in parenchymal echogenicity. IVC: No abnormality visualized. Pancreas: Not well seen secondary overlying midline bowel gas. Spleen: Size and appearance within normal limits. Right Kidney: Length: 9.1 cm. Echogenicity within normal limits. No mass or hydronephrosis visualized. Left Kidney: Length: 12.3 cm. Echogenicity within normal limits. No mass or hydronephrosis visualized. Abdominal aorta: Poorly visualized secondary  overlying bowel gas. Other findings: Right pleural effusion noted. IMPRESSION: 1. Imaging features suggest cholelithiasis with wall echo shadow sign. 2. Upper normal to mildly increased common duct diameter stable since CT scan yesterday. 3. Right pleural effusion. Electronically Signed   By: Kennith CenterEric  Mansell M.D.   On: 02/18/2016 10:07   Ct Abdomen Pelvis W Contrast  Result Date: 02/17/2016 CLINICAL DATA:  60 year old female with vomiting and abdominal pain. EXAM: CT ABDOMEN AND PELVIS WITH CONTRAST TECHNIQUE: Multidetector CT imaging of the abdomen and pelvis was performed using the standard protocol following bolus administration of intravenous contrast. CONTRAST:  1 ISOVUE-300 IOPAMIDOL (ISOVUE-300) INJECTION 61% COMPARISON:  CT dated 02/01/2016 FINDINGS: Partially visualized small bilateral pleural effusions relatively similar to prior study. There are bibasilar interstitial prominence and interlobular septal thickening most compatible with congestive changes and edema. There is mild cardiomegaly. No intra-abdominal free air. No free fluid. There is mild diffuse mesenteric stranding. There is slight heterogeneity and coarsened appearance of the liver which may be related to underlying fatty infiltration or hepatitis versus early cirrhosis. Correlation with clinical exam and LFTs recommended. No intrahepatic biliary ductal dilatation noted. The gallbladder is only partially distended. Tiny focus of air within the gallbladder may be related to  noncalcified stone. There is thickened appearance of the gallbladder wall which may partly be related to under distention or liver disease. Acute cholecystitis is not excluded further evaluation with ultrasound recommended. The common bile duct appears patent and unremarkable. The pancreas, spleen, adrenal glands, kidneys, visualized ureters, and urinary bladder appear unremarkable. Mild bilateral perinephric stranding again noted, nonspecific. Correlation with urinalysis  and renal function tests recommended. Small amount of air within the urinary bladder likely iatrogenic. Correlation with history of recent instrumentation recommended. Small uterine calcified fibroids. The ovaries are grossly unremarkable. Evaluation of the bowel is limited in the absence of oral contrast. There is no evidence of bowel obstruction or active inflammation. Normal appendix. There are multiple small supraumbilical hernias. There is protrusion of a short segment of transverse colon in the uppermost hernia, similar to prior study without associated obstruction or inflammation. There is advanced aortoiliac atherosclerotic disease. The origins of the celiac axis, SMA, IMA as well as the origins of the renal arteries appear patent. No portal venous gas identified. The SMV, splenic vein, and main portal vein are patent. There is no adenopathy. Midline vertical anterior pelvic wall incisional scar noted. There is diffuse subcutaneous soft tissue edema and anasarca. No fluid collection. There is degenerative changes of the spine. No acute fracture. IMPRESSION: Partially visualized bilateral pleural effusions, pulmonary edema and anasarca. Mild heterogeneity of the liver. Correlation with clinical exam and liver function tests recommended. Mild thickened appearance of the gallbladder with possible small noncalcified stone. Further evaluation with right upper quadrant ultrasound recommended. No evidence of bowel obstruction or active inflammation. Normal appendix. Ventral/supraumbilical hernia containing a short segment of transverse colon without evidence of obstruction or inflammation. Electronically Signed   By: Elgie Collard M.D.   On: 02/17/2016 23:13       Subjective: Patient is feeling better. No nausea or vomiting. He also has skin lesions are improving.  Discharge Exam: Vitals:   03/05/16 0852 03/05/16 1300  BP: 135/61 (!) 146/72  Pulse: 87 82  Resp:  18  Temp:  98.1 F (36.7 C)    Vitals:   03/04/16 2148 03/05/16 0543 03/05/16 0852 03/05/16 1300  BP: (!) 148/73 133/62 135/61 (!) 146/72  Pulse: 86 84 87 82  Resp: 18 18  18   Temp: 98.1 F (36.7 C) 97.7 F (36.5 C)  98.1 F (36.7 C)  TempSrc: Oral Oral  Oral  SpO2: 100% 100%  98%  Weight:      Height:        General: Pt is alert, awake, not in acute distress Cardiovascular: RRR, S1/S2 +, no rubs, no gallops Respiratory: CTA bilaterally, no wheezing, no rhonchi Abdominal: Soft, NT, ND, bowel sounds + Extremities: 1+ edema, no cyanosis    The results of significant diagnostics from this hospitalization (including imaging, microbiology, ancillary and laboratory) are listed below for reference.     Microbiology: Recent Results (from the past 240 hour(s))  Blood culture (routine x 2)     Status: Abnormal   Collection Time: 02/27/16  5:06 PM  Result Value Ref Range Status   Specimen Description BLOOD LEFT ANTECUBITAL  Final   Special Requests BOTTLES DRAWN AEROBIC AND ANAEROBIC 10CC EACH  Final   Culture  Setup Time   Final    GRAM POSITIVE COCCI IN CLUSTERS RECOVERED FROM BOTH THE AEROBIC AND ANAEROBIC BOTTLES Gram Stain Report Called to,Read Back By and Verified With: SCHONEWITZ,L. AT 0909  ON 02/28/2016 BY EVA Performed at [facility]  Culture METHICILLIN RESISTANT STAPHYLOCOCCUS AUREUS (A)  Final   Report Status 03/01/2016 FINAL  Final   Organism ID, Bacteria METHICILLIN RESISTANT STAPHYLOCOCCUS AUREUS  Final      Susceptibility   Methicillin resistant staphylococcus aureus - MIC*    CIPROFLOXACIN >=8 RESISTANT Resistant     ERYTHROMYCIN >=8 RESISTANT Resistant     GENTAMICIN <=0.5 SENSITIVE Sensitive     OXACILLIN >=4 RESISTANT Resistant     TETRACYCLINE <=1 SENSITIVE Sensitive     VANCOMYCIN <=0.5 SENSITIVE Sensitive     TRIMETH/SULFA <=10 SENSITIVE Sensitive     CLINDAMYCIN <=0.25 SENSITIVE Sensitive     RIFAMPIN <=0.5 SENSITIVE Sensitive     Inducible Clindamycin NEGATIVE  Sensitive     * METHICILLIN RESISTANT STAPHYLOCOCCUS AUREUS  Blood Culture ID Panel (Reflexed)     Status: Abnormal   Collection Time: 02/27/16  5:06 PM  Result Value Ref Range Status   Enterococcus species NOT DETECTED NOT DETECTED Final   Listeria monocytogenes NOT DETECTED NOT DETECTED Final   Staphylococcus species DETECTED (A) NOT DETECTED Final    Comment: CRITICAL RESULT CALLED TO, READ BACK BY AND VERIFIED WITH: S. Hall Pharm.D. 14:25 02/28/16 ([email or web address])    Staphylococcus aureus DETECTED (A) NOT DETECTED Final    Comment: CRITICAL RESULT CALLED TO, READ BACK BY AND VERIFIED WITH: S. Hall Pharm.D. 14:25 02/28/16 ([email or web address])    Methicillin resistance DETECTED (A) NOT DETECTED Final    Comment: CRITICAL RESULT CALLED TO, READ BACK BY AND VERIFIED WITH: S. Hall Pharm.D. 14:25 02/28/16 ([email or web address])    Streptococcus species NOT DETECTED NOT DETECTED Final   Streptococcus agalactiae NOT DETECTED NOT DETECTED Final   Streptococcus pneumoniae NOT DETECTED NOT DETECTED Final   Streptococcus pyogenes NOT DETECTED NOT DETECTED Final   Acinetobacter baumannii NOT DETECTED NOT DETECTED Final   Enterobacteriaceae species NOT DETECTED NOT DETECTED Final   Enterobacter cloacae complex NOT DETECTED NOT DETECTED Final   Escherichia coli NOT DETECTED NOT DETECTED Final   Klebsiella oxytoca NOT DETECTED NOT DETECTED Final   Klebsiella pneumoniae NOT DETECTED NOT DETECTED Final   Proteus species NOT DETECTED NOT DETECTED Final   Serratia marcescens NOT DETECTED NOT DETECTED Final   Haemophilus influenzae NOT DETECTED NOT DETECTED Final   Neisseria meningitidis NOT DETECTED NOT DETECTED Final   Pseudomonas aeruginosa NOT DETECTED NOT DETECTED Final   Candida albicans NOT DETECTED NOT DETECTED Final   Candida glabrata NOT DETECTED NOT DETECTED Final   Candida krusei NOT DETECTED NOT DETECTED Final   Candida parapsilosis NOT DETECTED NOT DETECTED Final   Candida tropicalis NOT DETECTED NOT DETECTED Final     Comment: Performed at [facility]  Blood culture (routine x 2)     Status: Abnormal   Collection Time: 02/27/16  5:13 PM  Result Value Ref Range Status   Specimen Description BLOOD RIGHT ANTECUBITAL  Final   Special Requests BOTTLES DRAWN AEROBIC AND ANAEROBIC 5CC EACH  Final   Culture  Setup Time   Final    GRAM POSITIVE COCCI IN CLUSTERS RECOVERED FROM BOTH AEROBIC AND ANAEROEBIC BOTTLE Gram Stain Report Called to,Read Back By and Verified With: SCHONEWITZ, L. AT 0909 ON 02/28/2016 BY EVA    Culture (A)  Final    STAPHYLOCOCCUS AUREUS SUSCEPTIBILITIES PERFORMED ON PREVIOUS CULTURE WITHIN THE LAST 5 DAYS. Performed at [facility]    Report Status 03/01/2016 FINAL  Final  MRSA PCR Screening     Status: Abnormal   Collection Time: 02/27/16 10:00 PM  Result  Value Ref Range Status   MRSA by PCR POSITIVE (A) NEGATIVE Final    Comment:        The GeneXpert MRSA Assay (FDA approved for NASAL specimens only), is one component of a comprehensive MRSA colonization surveillance program. It is not intended to diagnose MRSA infection nor to guide or monitor treatment for MRSA infections. RESULT CALLED TO, READ BACK BY AND VERIFIED WITH: SPANGLER,E. AT 1030 ON 02/28/2016 BY BAUGHAM,M.   Culture, blood (Routine X 2) w Reflex to ID Panel     Status: None (Preliminary result)   Collection Time: 03/01/16  5:58 AM  Result Value Ref Range Status   Specimen Description BLOOD RIGHT HAND  Final   Special Requests BOTTLES DRAWN AEROBIC AND ANAEROBIC 4CC  Final   Culture NO GROWTH 4 DAYS  Final   Report Status PENDING  Incomplete  Culture, blood (Routine X 2) w Reflex to ID Panel     Status: None (Preliminary result)   Collection Time: 03/01/16  5:58 AM  Result Value Ref Range Status   Specimen Description BLOOD RIGHT HAND  Final   Special Requests BOTTLES DRAWN AEROBIC AND ANAEROBIC 6CC  Final   Culture NO GROWTH 4 DAYS  Final   Report Status PENDING  Incomplete      Labs: BNP (last 3 results)  Recent Labs  02/01/16 0844 02/17/16 1623  BNP 1,090.5* 1,299.0*   Basic Metabolic Panel:  Recent Labs Lab 02/29/16 0430 02/29/16 1638 03/01/16 0547 03/03/16 0630 03/04/16 0546 03/05/16 0622  NA 134* 132* 132* 138  135 137 139  K 4.2 5.5* 4.3 3.8  3.8 3.8 3.2*  CL 105 107 103 110  111 106 112*  CO2 23 17* 23 19*  19* 25 22  GLUCOSE 91 42* 117* 223*  223* 160* 117*  BUN 44* 49* 47* 42*  42* 41* 34*  CREATININE 2.08* 2.43* 2.26* 1.91*  1.91* 1.94* 1.53*  CALCIUM 8.0* 8.3* 8.2* 7.3*  7.3* 8.2* 7.2*  MG 1.5*  --   --   --   --   --   PHOS  --   --   --  3.3 3.7  --    Liver Function Tests:  Recent Labs Lab 03/03/16 0630 03/04/16 0546  ALBUMIN 1.4* 1.8*   No results for input(s): LIPASE, AMYLASE in the last 168 hours. No results for input(s): AMMONIA in the last 168 hours. CBC:  Recent Labs Lab 02/29/16 0430 03/01/16 0547 03/02/16 0805 03/03/16 0630 03/04/16 0546  WBC 20.4* 12.2* 8.1 4.8 5.2  HGB 11.0* 9.9* 8.8* 8.0* 9.6*  HCT 33.2* 29.4* 27.2* 24.5* 29.3*  MCV 88.3 87.8 88.9 88.1 87.7  PLT 311 301 274 249 322   Cardiac Enzymes: No results for input(s): CKTOTAL, CKMB, CKMBINDEX, TROPONINI in the last 168 hours. BNP: Invalid input(s): POCBNP CBG:  Recent Labs Lab 03/04/16 1742 03/04/16 1947 03/05/16 0720 03/05/16 1124 03/05/16 1611  GLUCAP 69 67 128* 104* 91   D-Dimer No results for input(s): DDIMER in the last 72 hours. Hgb A1c No results for input(s): HGBA1C in the last 72 hours. Lipid Profile No results for input(s): CHOL, HDL, LDLCALC, TRIG, CHOLHDL, LDLDIRECT in the last 72 hours. Thyroid function studies No results for input(s): TSH, T4TOTAL, T3FREE, THYROIDAB in the last 72 hours.  Invalid input(s): FREET3 Anemia work up  Recent Labs  03/03/16 0630  FERRITIN 413*  TIBC NOT CALCULATED  IRON 18*   Urinalysis    Component Value Date/Time  COLORURINE YELLOW 02/27/2016 1555   APPEARANCEUR  HAZY (A) 02/27/2016 1555   LABSPEC <1.005 (L) 02/27/2016 1555   PHURINE 5.0 02/27/2016 1555   GLUCOSEU >1000 (A) 02/27/2016 1555   HGBUR SMALL (A) 02/27/2016 1555   BILIRUBINUR NEGATIVE 02/27/2016 1555   KETONESUR NEGATIVE 02/27/2016 1555   PROTEINUR NEGATIVE 02/27/2016 1555   NITRITE NEGATIVE 02/27/2016 1555   LEUKOCYTESUR NEGATIVE 02/27/2016 1555   Sepsis Labs Invalid input(s): PROCALCITONIN,  WBC,  LACTICIDVEN Microbiology Recent Results (from the past 240 hour(s))  Blood culture (routine x 2)     Status: Abnormal   Collection Time: 02/27/16  5:06 PM  Result Value Ref Range Status   Specimen Description BLOOD LEFT ANTECUBITAL  Final   Special Requests BOTTLES DRAWN AEROBIC AND ANAEROBIC 10CC EACH  Final   Culture  Setup Time   Final    GRAM POSITIVE COCCI IN CLUSTERS RECOVERED FROM BOTH THE AEROBIC AND ANAEROBIC BOTTLES Gram Stain Report Called to,Read Back By and Verified With: SCHONEWITZ,L. AT 0909  ON 02/28/2016 BY EVA Performed at [facility]    Culture METHICILLIN RESISTANT STAPHYLOCOCCUS AUREUS (A)  Final   Report Status 03/01/2016 FINAL  Final   Organism ID, Bacteria METHICILLIN RESISTANT STAPHYLOCOCCUS AUREUS  Final      Susceptibility   Methicillin resistant staphylococcus aureus - MIC*    CIPROFLOXACIN >=8 RESISTANT Resistant     ERYTHROMYCIN >=8 RESISTANT Resistant     GENTAMICIN <=0.5 SENSITIVE Sensitive     OXACILLIN >=4 RESISTANT Resistant     TETRACYCLINE <=1 SENSITIVE Sensitive     VANCOMYCIN <=0.5 SENSITIVE Sensitive     TRIMETH/SULFA <=10 SENSITIVE Sensitive     CLINDAMYCIN <=0.25 SENSITIVE Sensitive     RIFAMPIN <=0.5 SENSITIVE Sensitive     Inducible Clindamycin NEGATIVE Sensitive     * METHICILLIN RESISTANT STAPHYLOCOCCUS AUREUS  Blood Culture ID Panel (Reflexed)     Status: Abnormal   Collection Time: 02/27/16  5:06 PM  Result Value Ref Range Status   Enterococcus species NOT DETECTED NOT DETECTED Final   Listeria monocytogenes NOT  DETECTED NOT DETECTED Final   Staphylococcus species DETECTED (A) NOT DETECTED Final    Comment: CRITICAL RESULT CALLED TO, READ BACK BY AND VERIFIED WITH: S. Hall Pharm.D. 14:25 02/28/16 ([email or web address])    Staphylococcus aureus DETECTED (A) NOT DETECTED Final    Comment: CRITICAL RESULT CALLED TO, READ BACK BY AND VERIFIED WITH: S. Hall Pharm.D. 14:25 02/28/16 ([email or web address])    Methicillin resistance DETECTED (A) NOT DETECTED Final    Comment: CRITICAL RESULT CALLED TO, READ BACK BY AND VERIFIED WITH: S. Hall Pharm.D. 14:25 02/28/16 ([email or web address])    Streptococcus species NOT DETECTED NOT DETECTED Final   Streptococcus agalactiae NOT DETECTED NOT DETECTED Final   Streptococcus pneumoniae NOT DETECTED NOT DETECTED Final   Streptococcus pyogenes NOT DETECTED NOT DETECTED Final   Acinetobacter baumannii NOT DETECTED NOT DETECTED Final   Enterobacteriaceae species NOT DETECTED NOT DETECTED Final   Enterobacter cloacae complex NOT DETECTED NOT DETECTED Final   Escherichia coli NOT DETECTED NOT DETECTED Final   Klebsiella oxytoca NOT DETECTED NOT DETECTED Final   Klebsiella pneumoniae NOT DETECTED NOT DETECTED Final   Proteus species NOT DETECTED NOT DETECTED Final   Serratia marcescens NOT DETECTED NOT DETECTED Final   Haemophilus influenzae NOT DETECTED NOT DETECTED Final   Neisseria meningitidis NOT DETECTED NOT DETECTED Final   Pseudomonas aeruginosa NOT DETECTED NOT DETECTED Final   Candida albicans NOT DETECTED NOT DETECTED Final   Candida glabrata  NOT DETECTED NOT DETECTED Final   Candida krusei NOT DETECTED NOT DETECTED Final   Candida parapsilosis NOT DETECTED NOT DETECTED Final   Candida tropicalis NOT DETECTED NOT DETECTED Final    Comment: Performed at [facility]  Blood culture (routine x 2)     Status: Abnormal   Collection Time: 02/27/16  5:13 PM  Result Value Ref Range Status   Specimen Description BLOOD RIGHT ANTECUBITAL  Final   Special Requests BOTTLES DRAWN AEROBIC AND  ANAEROBIC 5CC EACH  Final   Culture  Setup Time   Final    GRAM POSITIVE COCCI IN CLUSTERS RECOVERED FROM BOTH AEROBIC AND ANAEROEBIC BOTTLE Gram Stain Report Called to,Read Back By and Verified With: SCHONEWITZ, L. AT 0909 ON 02/28/2016 BY EVA    Culture (A)  Final    STAPHYLOCOCCUS AUREUS SUSCEPTIBILITIES PERFORMED ON PREVIOUS CULTURE WITHIN THE LAST 5 DAYS. Performed at [facility]    Report Status 03/01/2016 FINAL  Final  MRSA PCR Screening     Status: Abnormal   Collection Time: 02/27/16 10:00 PM  Result Value Ref Range Status   MRSA by PCR POSITIVE (A) NEGATIVE Final    Comment:        The GeneXpert MRSA Assay (FDA approved for NASAL specimens only), is one component of a comprehensive MRSA colonization surveillance program. It is not intended to diagnose MRSA infection nor to guide or monitor treatment for MRSA infections. RESULT CALLED TO, READ BACK BY AND VERIFIED WITH: SPANGLER,E. AT 1030 ON 02/28/2016 BY BAUGHAM,M.   Culture, blood (Routine X 2) w Reflex to ID Panel     Status: None (Preliminary result)   Collection Time: 03/01/16  5:58 AM  Result Value Ref Range Status   Specimen Description BLOOD RIGHT HAND  Final   Special Requests BOTTLES DRAWN AEROBIC AND ANAEROBIC 4CC  Final   Culture NO GROWTH 4 DAYS  Final   Report Status PENDING  Incomplete  Culture, blood (Routine X 2) w Reflex to ID Panel     Status: None (Preliminary result)   Collection Time: 03/01/16  5:58 AM  Result Value Ref Range Status   Specimen Description BLOOD RIGHT HAND  Final   Special Requests BOTTLES DRAWN AEROBIC AND ANAEROBIC 6CC  Final   Culture NO GROWTH 4 DAYS  Final   Report Status PENDING  Incomplete     Time coordinating discharge: Over 30 minutes  SIGNED:   Erick Blinks, MD  [facility] 03/05/2016, 7:54 PM Pager   If 7PM-7AM, please contact night-coverage [email or web address] Password [email or web address]

## 2016-03-06 LAB — CULTURE, BLOOD (ROUTINE X 2)
CULTURE: NO GROWTH
CULTURE: NO GROWTH

## 2016-03-11 ENCOUNTER — Emergency Department (HOSPITAL_COMMUNITY): Payer: BLUE CROSS/BLUE SHIELD

## 2016-03-11 ENCOUNTER — Emergency Department (HOSPITAL_COMMUNITY)
Admission: EM | Admit: 2016-03-11 | Discharge: 2016-03-11 | Disposition: A | Discharge status: Home / Self Care | Payer: BLUE CROSS/BLUE SHIELD | Attending: Emergency Medicine | Admitting: Emergency Medicine

## 2016-03-11 ENCOUNTER — Other Ambulatory Visit (HOSPITAL_COMMUNITY)
Admission: RE | Admit: 2016-03-11 | Discharge: 2016-03-11 | Disposition: A | Discharge status: Home / Self Care | Payer: BLUE CROSS/BLUE SHIELD | Source: Other Acute Inpatient Hospital | Attending: *Deleted | Admitting: *Deleted

## 2016-03-11 ENCOUNTER — Encounter (HOSPITAL_COMMUNITY): Payer: Self-pay | Admitting: Emergency Medicine

## 2016-03-11 DIAGNOSIS — Z7982 Long term (current) use of aspirin: Secondary | ICD-10-CM | POA: Insufficient documentation

## 2016-03-11 DIAGNOSIS — Z789 Other specified health status: Secondary | ICD-10-CM

## 2016-03-11 DIAGNOSIS — Z794 Long term (current) use of insulin: Secondary | ICD-10-CM | POA: Diagnosis not present

## 2016-03-11 DIAGNOSIS — Z5181 Encounter for therapeutic drug level monitoring: Secondary | ICD-10-CM | POA: Insufficient documentation

## 2016-03-11 DIAGNOSIS — I129 Hypertensive chronic kidney disease with stage 1 through stage 4 chronic kidney disease, or unspecified chronic kidney disease: Secondary | ICD-10-CM | POA: Diagnosis not present

## 2016-03-11 DIAGNOSIS — Z95828 Presence of other vascular implants and grafts: Secondary | ICD-10-CM

## 2016-03-11 DIAGNOSIS — Z452 Encounter for adjustment and management of vascular access device: Secondary | ICD-10-CM | POA: Insufficient documentation

## 2016-03-11 DIAGNOSIS — E1122 Type 2 diabetes mellitus with diabetic chronic kidney disease: Secondary | ICD-10-CM | POA: Insufficient documentation

## 2016-03-11 DIAGNOSIS — N183 Chronic kidney disease, stage 3 (moderate): Secondary | ICD-10-CM | POA: Insufficient documentation

## 2016-03-11 DIAGNOSIS — E7119 Other disorders of branched-chain amino-acid metabolism: Secondary | ICD-10-CM | POA: Insufficient documentation

## 2016-03-11 DIAGNOSIS — Z79899 Other long term (current) drug therapy: Secondary | ICD-10-CM | POA: Insufficient documentation

## 2016-03-11 DIAGNOSIS — F17210 Nicotine dependence, cigarettes, uncomplicated: Secondary | ICD-10-CM | POA: Diagnosis not present

## 2016-03-11 LAB — BASIC METABOLIC PANEL
Anion gap: 0 — ABNORMAL LOW (ref 5–15)
BUN: 15 mg/dL (ref 6–20)
CO2: 27 mmol/L (ref 22–32)
Calcium: 7.9 mg/dL — ABNORMAL LOW (ref 8.9–10.3)
Chloride: 112 mmol/L — ABNORMAL HIGH (ref 101–111)
Creatinine, Ser: 1.41 mg/dL — ABNORMAL HIGH (ref 0.44–1.00)
GFR calc Af Amer: 46 mL/min — ABNORMAL LOW (ref >60)
GFR, EST NON AFRICAN AMERICAN: 40 mL/min — AB (ref >60)
GLUCOSE: 83 mg/dL (ref 65–99)
POTASSIUM: 3.5 mmol/L (ref 3.5–5.1)
Sodium: 139 mmol/L (ref 135–145)

## 2016-03-11 LAB — CBC WITH DIFFERENTIAL/PLATELET
BASOS ABS: 0 10*3/uL (ref 0.0–0.1)
Basophils Relative: 0 %
EOS ABS: 0.2 10*3/uL (ref 0.0–0.7)
Eosinophils Relative: 2 %
HCT: 31.4 % — ABNORMAL LOW (ref 36.0–46.0)
HEMOGLOBIN: 10.3 g/dL — AB (ref 12.0–15.0)
LYMPHS ABS: 1.7 10*3/uL (ref 0.7–4.0)
LYMPHS PCT: 18 %
MCH: 28.9 pg (ref 26.0–34.0)
MCHC: 32.8 g/dL (ref 30.0–36.0)
MCV: 88 fL (ref 78.0–100.0)
Monocytes Absolute: 0.7 10*3/uL (ref 0.1–1.0)
Monocytes Relative: 7 %
NEUTROS ABS: 6.8 10*3/uL (ref 1.7–7.7)
Neutrophils Relative %: 73 %
PLATELETS: 372 10*3/uL (ref 150–400)
RBC: 3.57 MIL/uL — AB (ref 3.87–5.11)
RDW: 15.6 % — ABNORMAL HIGH (ref 11.5–15.5)
WBC: 9.4 10*3/uL (ref 4.0–10.5)

## 2016-03-11 LAB — CREATININE, SERUM
CREATININE: 1.38 mg/dL — AB (ref 0.44–1.00)
GFR calc non Af Amer: 41 mL/min — ABNORMAL LOW (ref >60)
GFR, EST AFRICAN AMERICAN: 47 mL/min — AB (ref >60)

## 2016-03-11 LAB — VANCOMYCIN, TROUGH: VANCOMYCIN TR: 12 ug/mL — AB (ref 15–20)

## 2016-03-11 LAB — BUN: BUN: 15 mg/dL (ref 6–20)

## 2016-03-11 NOTE — ED Notes (Signed)
Contacting vascular access team for PICC replacement.

## 2016-03-11 NOTE — ED Triage Notes (Signed)
Called for Triage. No answer 

## 2016-03-11 NOTE — ED Provider Notes (Signed)
[facility] DEPT Provider Note   CSN: [id number] Arrival date & time: 03/11/16  1147     History   Chief Complaint Chief Complaint  Patient presents with  . Vascular access problems    HPI [name] is a 60 y.o. female.  HPI  The patient was sent to the emergency department to evaluate her PICC line which was inserted approximately a week ago, she is getting intravenous antibiotics through this, when the nurse arrived today they noted that the PICC line had been removed significantly  Past Medical History:  Diagnosis Date  . Anxiety   . Depression   . GERD (gastroesophageal reflux disease)   . High cholesterol   . Hypertension   . Type II diabetes mellitus [facility])     Patient Active Problem List   Diagnosis Date Noted  . MRSA bacteremia 02/29/2016  . Pressure injury of skin 02/28/2016  . Hyperglycemia 02/27/2016  . Abscess of skin and subcutaneous tissue 02/27/2016  . Hyperosmolarity syndrome 02/27/2016  . h/o dialysis needs spring 2017 02/18/2016  . Paroxysmal SVT (supraventricular tachycardia) (HCC) 02/18/2016  . Emesis 02/18/2016  . Hypokalemia 02/17/2016  . Volume overload 02/02/2016  . Abdominal pain 02/01/2016  . CKD (chronic kidney disease), stage III 10/24/2015  . Abscess of axilla, right 10/23/2015  . HLD (hyperlipidemia) 10/12/2015  . Anemia of chronic disease 10/12/2015  . Depression 10/12/2015  . UTI (lower urinary tract infection) 09/12/2015  . Generalized exfoliative dermatitis 09/09/2015  . Normocytic anemia 09/09/2015  . Necrotizing fasciitis (HCC) of inner thigh 08/07/2015  . IDDM (insulin dependent diabetes mellitus) (HCC) 08/07/2015  . History of hypertension 08/07/2015  . Hypertension 08/07/2015    Past Surgical History:  Procedure Laterality Date  . APPLICATION OF WOUND VAC Right 10/25/2015   Procedure: APPLICATION OF WOUND VAC;  Surgeon: Franky Macho, MD;  Location: [facility] ORS;  Service: General;  Laterality: Right;  .  CESAREAN SECTION  1984  . INCISION AND DRAINAGE Right 08/09/2015   Procedure: INCISION AND DRAINAGE;  Surgeon: De Blanch Kinsinger, MD;  Location: [facility] OR;  Service: General;  Laterality: Right;  . INCISION AND DRAINAGE ABSCESS Right 08/08/2015   Procedure: Irrigation and Debridement of Right Lower Ext.;  Surgeon: Axel Filler, MD;  Location: [facility] OR;  Service: General;  Laterality: Right;  . INCISION AND DRAINAGE ABSCESS Right 10/25/2015   Procedure: INCISION AND DRAINAGE ABSCESS RIGHT AXILLARY ABSCESS;  Surgeon: Franky Macho, MD;  Location: [facility] ORS;  Service: General;  Laterality: Right;    OB History    Gravida Para Term Preterm AB Living   4 1 1   3      SAB TAB Ectopic Multiple Live Births   3               Home Medications    Prior to Admission medications   Medication Sig Start Date End Date Taking? Authorizing Provider  aspirin EC 325 MG tablet Take 325 mg by mouth daily.   Yes Historical Provider, MD  collagenase (SANTYL) ointment Apply in a nickel thickness to left achilles wound in the wound bed only, being careful not to get on surrounding skin.  Cover with dry gauze. 02/03/16  Yes Beather Arbour, MD  FLUoxetine (PROZAC) 20 MG capsule Take 20 mg by mouth 2 (two) times daily.  05/11/15  Yes Historical Provider, MD  HYDROmorphone (DILAUDID) 4 MG tablet Take 4 mg by mouth 2 (two) times daily as needed. For pain 11/09/15  Yes Historical Provider, MD  insulin aspart (NOVOLOG FLEXPEN) 100 UNIT/ML FlexPen Inject 3 Units into the skin 3 (three) times daily with meals.   Yes Historical Provider, MD  insulin glargine (LANTUS) 100 UNIT/ML injection Inject 0.07 mLs (7 Units total) into the skin daily. 03/05/16  Yes Erick Blinks, MD  iron polysaccharides (NIFEREX) 150 MG capsule Take 1 capsule (150 mg total) by mouth 2 (two) times daily. 03/05/16  Yes Erick Blinks, MD  metoprolol tartrate (LOPRESSOR) 25 MG tablet Take 1 tablet (25 mg total) by mouth 2 (two) times daily. 09/21/15  Yes Leroy Sea, MD  Multiple Vitamin (MULTIVITAMIN WITH MINERALS) TABS tablet Take 1 tablet by mouth daily. 10/26/15  Yes Henderson Cloud, MD  mupirocin ointment (BACTROBAN) 2 % Apply topically 3 (three) times daily. 03/05/16  Yes Erick Blinks, MD  pantoprazole (PROTONIX) 40 MG tablet Take 1 tablet (40 mg total) by mouth daily. 02/03/16  Yes Beather Arbour, MD  pravastatin (PRAVACHOL) 40 MG tablet Take 40 mg by mouth at bedtime.  05/11/15  Yes Historical Provider, MD  QUEtiapine (SEROQUEL) 25 MG tablet Take 1 tablet by mouth at bedtime.  12/22/15  Yes Historical Provider, MD  torsemide (DEMADEX) 20 MG tablet Take 1 tablet (20 mg total) by mouth daily as needed (swelling). 03/05/16  Yes Erick Blinks, MD  Vancomycin (VANCOCIN) 750-5 MG/150ML-% SOLN Inject 150 mLs (750 mg total) into the vein daily. Until 10/6 03/06/16  Yes Erick Blinks, MD  albuterol (PROAIR HFA) 108 (90 Base) MCG/ACT inhaler Inhale 2 puffs into the lungs every 6 (six) hours as needed for wheezing or shortness of breath.    Historical Provider, MD    Family History Family History  Problem Relation Age of Onset  . Hypertension Mother   . Diabetes Mother   . Kidney failure Father     Social History Social History  Substance Use Topics  . Smoking status: Current Every Day Smoker    Packs/day: 0.10    Years: 35.00    Types: Cigarettes  . Smokeless tobacco: Never Used  . Alcohol use 0.6 oz/week    1 Glasses of wine per week     Allergies   Other and Penicillins   Review of Systems Review of Systems  Negative for fevers, negative for shortness of breath, positive for rash  Physical Exam Updated Vital Signs BP 153/78 (BP Location: Right Arm)   Pulse 65   Temp (!) 96.7 F (35.9 C) (Temporal)   Resp 17   Ht 5\' 3"  (1.6 m)   Wt 198 lb (89.8 kg)   SpO2 98%   BMI 35.07 kg/m   Physical Exam  Constitutional: She appears well-developed and well-nourished.  HENT:  Head: Normocephalic and atraumatic.  Eyes:  Conjunctivae are normal. Right eye exhibits no discharge. Left eye exhibits no discharge.  Pulmonary/Chest: Effort normal. No respiratory distress.  Neurological: She is alert. Coordination normal.  Skin: Skin is warm and dry. No rash noted. She is not diaphoretic. No erythema.  No redness or tenderness surrounding the PICC line in the right upper extremity, significant amount of vascular catheter is external to the skin  Psychiatric: She has a normal mood and affect.  Nursing note and vitals reviewed.    ED Treatments / Results  Labs (all labs ordered are listed, but only abnormal results are displayed) Labs Reviewed  BASIC METABOLIC PANEL - Abnormal; Notable for the following:       Result Value   Chloride 112 (*)  Creatinine, Ser 1.41 (*)    Calcium 7.9 (*)    GFR calc non Af Amer 40 (*)    GFR calc Af Amer 46 (*)    Anion gap 0 (*)    All other components within normal limits    EKG  EKG Interpretation None       Radiology Dg Chest 2 View  Result Date: 03/11/2016 CLINICAL DATA:  PICC line placement.  Tobacco use. EXAM: CHEST  2 VIEW COMPARISON:  02/27/2016 FINDINGS: The tip of the right-sided PICC line projects over the subclavian vein. If lower SVC placement is desired, advance 13 cm. Airspace opacity in the left lower lobe. Blunting of the left lateral costophrenic angle. Low lung volumes. Thoracic spondylosis. Atherosclerotic calcification of the aortic arch. IMPRESSION: 1. Continued mild airspace opacity in the left lower lobe with suspected left pleural effusion. 2. Right-sided PICC line tip: Right subclavian vein. If lower SVC placement is desired, advanced 13 cm. Electronically Signed   By: Gaylyn RongWalter  Liebkemann M.D.   On: 03/11/2016 14:34   Dg Chest Port 1 View  Result Date: 03/11/2016 CLINICAL DATA:  RIGHT-side PICC line placement, diabetes mellitus, hypertension, smoker, GERD EXAM: PORTABLE CHEST 1 VIEW COMPARISON:  Portable exam 1733 hours compared to earlier  study of 14/4 hours FINDINGS: Tip of RIGHT arm PICC line projects over proximal SVC. Enlargement of cardiac silhouette with pulmonary vascular congestion. Mediastinal contour stable. Minimal RIGHT basilar atelectasis. Persistent atelectasis versus infiltrate LEFT lower lobe. No pneumothorax. IMPRESSION: Tip of RIGHT arm PICC line projects over proximal SVC. Enlargement of cardiac silhouette with pulmonary vascular congestion. Minimal RIGHT basilar atelectasis with persistent atelectasis versus consolidation in LEFT lower lobe. Electronically Signed   By: Ulyses SouthwardMark  Boles M.D.   On: 03/11/2016 17:44    Procedures Procedures (including critical care time)  Medications Ordered in ED Medications - No data to display   Initial Impression / Assessment and Plan / ED Course  I have reviewed the triage vital signs and the nursing notes.  Pertinent labs & imaging results that were available during my care of the patient were reviewed by me and considered in my medical decision making (see chart for details).  Clinical Course    PICC line replaced, patient appears well, x-ray reviewed, no pneumothorax, PICC line at approximately the level of the carina, SVC  Final Clinical Impressions(s) / ED Diagnoses   Final diagnoses:  Problem with vascular access    New Prescriptions New Prescriptions   No medications on file     Eber HongBrian Dymphna Wadley, MD 03/11/16 [phone]

## 2016-03-11 NOTE — ED Notes (Signed)
Vascular access nurse arrived to replace PICC.

## 2016-03-11 NOTE — ED Notes (Signed)
Pt states her home health nurse feels PICC line is farther out than before.  Dressing intact and is clean and dry.  Pt denies any complaints.

## 2016-03-11 NOTE — ED Notes (Signed)
Vascular access team called and states they will arrive around 1715.  Pt denies any complaints.

## 2016-03-11 NOTE — ED Triage Notes (Signed)
Pt states she needs an xray to see if her PICC line is still in the right place.

## 2016-06-13 ENCOUNTER — Emergency Department (HOSPITAL_COMMUNITY): Payer: BLUE CROSS/BLUE SHIELD

## 2016-06-13 ENCOUNTER — Encounter (HOSPITAL_COMMUNITY): Payer: Self-pay | Admitting: Emergency Medicine

## 2016-06-13 ENCOUNTER — Inpatient Hospital Stay (HOSPITAL_COMMUNITY)
Admission: EM | Admit: 2016-06-13 | Discharge: 2016-06-19 | DRG: 291 | Disposition: A | Discharge status: Home / Self Care | Payer: BLUE CROSS/BLUE SHIELD | Attending: Internal Medicine | Admitting: Internal Medicine

## 2016-06-13 DIAGNOSIS — E784 Other hyperlipidemia: Secondary | ICD-10-CM

## 2016-06-13 DIAGNOSIS — Z8679 Personal history of other diseases of the circulatory system: Secondary | ICD-10-CM

## 2016-06-13 DIAGNOSIS — K219 Gastro-esophageal reflux disease without esophagitis: Secondary | ICD-10-CM | POA: Diagnosis present

## 2016-06-13 DIAGNOSIS — IMO0001 Reserved for inherently not codable concepts without codable children: Secondary | ICD-10-CM

## 2016-06-13 DIAGNOSIS — I130 Hypertensive heart and chronic kidney disease with heart failure and stage 1 through stage 4 chronic kidney disease, or unspecified chronic kidney disease: Principal | ICD-10-CM | POA: Diagnosis present

## 2016-06-13 DIAGNOSIS — E876 Hypokalemia: Secondary | ICD-10-CM | POA: Diagnosis present

## 2016-06-13 DIAGNOSIS — L02415 Cutaneous abscess of right lower limb: Secondary | ICD-10-CM | POA: Diagnosis present

## 2016-06-13 DIAGNOSIS — L0291 Cutaneous abscess, unspecified: Secondary | ICD-10-CM

## 2016-06-13 DIAGNOSIS — F329 Major depressive disorder, single episode, unspecified: Secondary | ICD-10-CM | POA: Diagnosis present

## 2016-06-13 DIAGNOSIS — E119 Type 2 diabetes mellitus without complications: Secondary | ICD-10-CM

## 2016-06-13 DIAGNOSIS — E1122 Type 2 diabetes mellitus with diabetic chronic kidney disease: Secondary | ICD-10-CM | POA: Diagnosis present

## 2016-06-13 DIAGNOSIS — I5033 Acute on chronic diastolic (congestive) heart failure: Secondary | ICD-10-CM | POA: Diagnosis present

## 2016-06-13 DIAGNOSIS — L02419 Cutaneous abscess of limb, unspecified: Secondary | ICD-10-CM | POA: Diagnosis not present

## 2016-06-13 DIAGNOSIS — Z841 Family history of disorders of kidney and ureter: Secondary | ICD-10-CM | POA: Diagnosis not present

## 2016-06-13 DIAGNOSIS — I471 Supraventricular tachycardia: Secondary | ICD-10-CM | POA: Diagnosis present

## 2016-06-13 DIAGNOSIS — Z8249 Family history of ischemic heart disease and other diseases of the circulatory system: Secondary | ICD-10-CM

## 2016-06-13 DIAGNOSIS — Z794 Long term (current) use of insulin: Secondary | ICD-10-CM

## 2016-06-13 DIAGNOSIS — E11649 Type 2 diabetes mellitus with hypoglycemia without coma: Secondary | ICD-10-CM | POA: Diagnosis present

## 2016-06-13 DIAGNOSIS — E8770 Fluid overload, unspecified: Secondary | ICD-10-CM | POA: Diagnosis present

## 2016-06-13 DIAGNOSIS — F17210 Nicotine dependence, cigarettes, uncomplicated: Secondary | ICD-10-CM | POA: Diagnosis present

## 2016-06-13 DIAGNOSIS — T502X5A Adverse effect of carbonic-anhydrase inhibitors, benzothiadiazides and other diuretics, initial encounter: Secondary | ICD-10-CM | POA: Diagnosis present

## 2016-06-13 DIAGNOSIS — I509 Heart failure, unspecified: Secondary | ICD-10-CM

## 2016-06-13 DIAGNOSIS — E7800 Pure hypercholesterolemia, unspecified: Secondary | ICD-10-CM | POA: Diagnosis present

## 2016-06-13 DIAGNOSIS — Z833 Family history of diabetes mellitus: Secondary | ICD-10-CM

## 2016-06-13 DIAGNOSIS — N183 Chronic kidney disease, stage 3 unspecified: Secondary | ICD-10-CM | POA: Diagnosis present

## 2016-06-13 DIAGNOSIS — I313 Pericardial effusion (noninflammatory): Secondary | ICD-10-CM | POA: Diagnosis not present

## 2016-06-13 DIAGNOSIS — F419 Anxiety disorder, unspecified: Secondary | ICD-10-CM | POA: Diagnosis present

## 2016-06-13 DIAGNOSIS — J9601 Acute respiratory failure with hypoxia: Secondary | ICD-10-CM | POA: Diagnosis present

## 2016-06-13 DIAGNOSIS — Z880 Allergy status to penicillin: Secondary | ICD-10-CM | POA: Diagnosis not present

## 2016-06-13 DIAGNOSIS — E785 Hyperlipidemia, unspecified: Secondary | ICD-10-CM | POA: Diagnosis present

## 2016-06-13 DIAGNOSIS — Z8614 Personal history of Methicillin resistant Staphylococcus aureus infection: Secondary | ICD-10-CM | POA: Diagnosis not present

## 2016-06-13 DIAGNOSIS — Z7982 Long term (current) use of aspirin: Secondary | ICD-10-CM

## 2016-06-13 DIAGNOSIS — I5031 Acute diastolic (congestive) heart failure: Secondary | ICD-10-CM | POA: Diagnosis not present

## 2016-06-13 LAB — COMPREHENSIVE METABOLIC PANEL
ALBUMIN: 2.4 g/dL — AB (ref 3.5–5.0)
ALK PHOS: 149 U/L — AB (ref 38–126)
ALT: 18 U/L (ref 14–54)
ANION GAP: 8 (ref 5–15)
AST: 34 U/L (ref 15–41)
BILIRUBIN TOTAL: 0.5 mg/dL (ref 0.3–1.2)
BUN: 16 mg/dL (ref 6–20)
CALCIUM: 8.4 mg/dL — AB (ref 8.9–10.3)
CO2: 29 mmol/L (ref 22–32)
Chloride: 104 mmol/L (ref 101–111)
Creatinine, Ser: 1.26 mg/dL — ABNORMAL HIGH (ref 0.44–1.00)
GFR calc Af Amer: 52 mL/min — ABNORMAL LOW (ref >60)
GFR calc non Af Amer: 45 mL/min — ABNORMAL LOW (ref >60)
GLUCOSE: 213 mg/dL — AB (ref 65–99)
Potassium: 3.8 mmol/L (ref 3.5–5.1)
Sodium: 141 mmol/L (ref 135–145)
TOTAL PROTEIN: 7 g/dL (ref 6.5–8.1)

## 2016-06-13 LAB — GLUCOSE, CAPILLARY: Glucose-Capillary: 186 mg/dL — ABNORMAL HIGH (ref 65–99)

## 2016-06-13 LAB — CBC WITH DIFFERENTIAL/PLATELET
BASOS ABS: 0 10*3/uL (ref 0.0–0.1)
Basophils Relative: 1 %
EOS PCT: 1 %
Eosinophils Absolute: 0.1 10*3/uL (ref 0.0–0.7)
HCT: 34.1 % — ABNORMAL LOW (ref 36.0–46.0)
Hemoglobin: 10.9 g/dL — ABNORMAL LOW (ref 12.0–15.0)
Lymphocytes Relative: 19 %
Lymphs Abs: 1.2 10*3/uL (ref 0.7–4.0)
MCH: 30.8 pg (ref 26.0–34.0)
MCHC: 32 g/dL (ref 30.0–36.0)
MCV: 96.3 fL (ref 78.0–100.0)
MONO ABS: 0.5 10*3/uL (ref 0.1–1.0)
Monocytes Relative: 8 %
Neutro Abs: 4.5 10*3/uL (ref 1.7–7.7)
Neutrophils Relative %: 71 %
PLATELETS: 285 10*3/uL (ref 150–400)
RBC: 3.54 MIL/uL — ABNORMAL LOW (ref 3.87–5.11)
RDW: 16.7 % — AB (ref 11.5–15.5)
WBC: 6.2 10*3/uL (ref 4.0–10.5)

## 2016-06-13 LAB — TROPONIN I: Troponin I: <0.03 ng/mL (ref <0.03)

## 2016-06-13 LAB — MAGNESIUM: MAGNESIUM: 2 mg/dL (ref 1.7–2.4)

## 2016-06-13 LAB — BRAIN NATRIURETIC PEPTIDE: B NATRIURETIC PEPTIDE 5: 1011 pg/mL — AB (ref 0.0–100.0)

## 2016-06-13 MED ORDER — ASPIRIN EC 325 MG PO TBEC
325.0000 mg | DELAYED_RELEASE_TABLET | Freq: Every day | ORAL | Status: DC
  Administered 2016-06-14 – 2016-06-19 (×6): 325 mg via ORAL
  Filled 2016-06-13 (×6): qty 1

## 2016-06-13 MED ORDER — INSULIN ASPART 100 UNIT/ML ~~LOC~~ SOLN
3.0000 [IU] | Freq: Three times a day (TID) | SUBCUTANEOUS | Status: DC
  Administered 2016-06-14 – 2016-06-15 (×4): 3 [IU] via SUBCUTANEOUS

## 2016-06-13 MED ORDER — SODIUM CHLORIDE 0.9 % IV SOLN
INTRAVENOUS | Status: AC
  Administered 2016-06-14: via INTRAVENOUS

## 2016-06-13 MED ORDER — INSULIN GLARGINE 100 UNIT/ML ~~LOC~~ SOLN
7.0000 [IU] | Freq: Every day | SUBCUTANEOUS | Status: DC
  Administered 2016-06-14 – 2016-06-15 (×2): 7 [IU] via SUBCUTANEOUS
  Filled 2016-06-13 (×4): qty 0.07

## 2016-06-13 MED ORDER — INSULIN ASPART 100 UNIT/ML ~~LOC~~ SOLN: 0.0000 [IU] | Freq: Every day | SUBCUTANEOUS | Status: DC

## 2016-06-13 MED ORDER — VANCOMYCIN HCL 10 G IV SOLR
1250.0000 mg | INTRAVENOUS | Status: DC
  Administered 2016-06-14: 1250 mg via INTRAVENOUS
  Filled 2016-06-13 (×3): qty 1250

## 2016-06-13 MED ORDER — QUETIAPINE FUMARATE 25 MG PO TABS
25.0000 mg | ORAL_TABLET | Freq: Every day | ORAL | Status: DC
  Administered 2016-06-14 – 2016-06-18 (×6): 25 mg via ORAL
  Filled 2016-06-13 (×6): qty 1

## 2016-06-13 MED ORDER — HYDROMORPHONE HCL 2 MG PO TABS
2.0000 mg | ORAL_TABLET | Freq: Four times a day (QID) | ORAL | Status: DC | PRN
  Administered 2016-06-14 – 2016-06-16 (×6): 2 mg via ORAL
  Filled 2016-06-13 (×6): qty 1

## 2016-06-13 MED ORDER — POVIDONE-IODINE 10 % EX SOLN
CUTANEOUS | Status: AC
  Filled 2016-06-13: qty 118

## 2016-06-13 MED ORDER — POLYSACCHARIDE IRON COMPLEX 150 MG PO CAPS
150.0000 mg | ORAL_CAPSULE | Freq: Two times a day (BID) | ORAL | Status: DC
  Administered 2016-06-14 – 2016-06-19 (×12): 150 mg via ORAL
  Filled 2016-06-13 (×13): qty 1

## 2016-06-13 MED ORDER — INSULIN ASPART 100 UNIT/ML ~~LOC~~ SOLN
0.0000 [IU] | Freq: Three times a day (TID) | SUBCUTANEOUS | Status: DC
  Administered 2016-06-14: 3 [IU] via SUBCUTANEOUS
  Administered 2016-06-14 – 2016-06-15 (×3): 2 [IU] via SUBCUTANEOUS
  Administered 2016-06-15: 5 [IU] via SUBCUTANEOUS

## 2016-06-13 MED ORDER — HEPARIN SODIUM (PORCINE) 5000 UNIT/ML IJ SOLN
5000.0000 [IU] | Freq: Three times a day (TID) | INTRAMUSCULAR | Status: DC
  Administered 2016-06-14 – 2016-06-16 (×8): 5000 [IU] via SUBCUTANEOUS
  Filled 2016-06-13 (×7): qty 1

## 2016-06-13 MED ORDER — PRAVASTATIN SODIUM 40 MG PO TABS
40.0000 mg | ORAL_TABLET | Freq: Every day | ORAL | Status: DC
  Administered 2016-06-14 – 2016-06-18 (×6): 40 mg via ORAL
  Filled 2016-06-13 (×6): qty 1

## 2016-06-13 MED ORDER — ADULT MULTIVITAMIN W/MINERALS CH
1.0000 | ORAL_TABLET | Freq: Every day | ORAL | Status: DC
  Administered 2016-06-14 – 2016-06-19 (×6): 1 via ORAL
  Filled 2016-06-13 (×6): qty 1

## 2016-06-13 MED ORDER — METOPROLOL TARTRATE 25 MG PO TABS
25.0000 mg | ORAL_TABLET | Freq: Two times a day (BID) | ORAL | Status: DC
  Administered 2016-06-14 – 2016-06-19 (×12): 25 mg via ORAL
  Filled 2016-06-13 (×12): qty 1

## 2016-06-13 MED ORDER — FUROSEMIDE 10 MG/ML IJ SOLN
40.0000 mg | Freq: Two times a day (BID) | INTRAMUSCULAR | Status: DC
  Administered 2016-06-14: 40 mg via INTRAVENOUS
  Filled 2016-06-13: qty 4

## 2016-06-13 MED ORDER — VANCOMYCIN HCL IN DEXTROSE 1-5 GM/200ML-% IV SOLN
1000.0000 mg | Freq: Once | INTRAVENOUS | Status: AC
  Administered 2016-06-14: 1000 mg via INTRAVENOUS
  Filled 2016-06-13: qty 200

## 2016-06-13 MED ORDER — SODIUM CHLORIDE 0.9% FLUSH
3.0000 mL | Freq: Two times a day (BID) | INTRAVENOUS | Status: DC
  Administered 2016-06-14 – 2016-06-18 (×8): 3 mL via INTRAVENOUS
  Administered 2016-06-18: 10 mL via INTRAVENOUS
  Administered 2016-06-19: 3 mL via INTRAVENOUS

## 2016-06-13 MED ORDER — FLUOXETINE HCL 20 MG PO CAPS
20.0000 mg | ORAL_CAPSULE | Freq: Two times a day (BID) | ORAL | Status: DC
  Administered 2016-06-14 – 2016-06-19 (×12): 20 mg via ORAL
  Filled 2016-06-13 (×12): qty 1

## 2016-06-13 MED ORDER — LIDOCAINE HCL (PF) 1 % IJ SOLN
5.0000 mL | Freq: Once | INTRAMUSCULAR | Status: AC
  Administered 2016-06-13: 5 mL
  Filled 2016-06-13: qty 5

## 2016-06-13 MED ORDER — PANTOPRAZOLE SODIUM 40 MG PO TBEC
40.0000 mg | DELAYED_RELEASE_TABLET | Freq: Every day | ORAL | Status: DC
  Administered 2016-06-14 – 2016-06-19 (×6): 40 mg via ORAL
  Filled 2016-06-13 (×5): qty 1

## 2016-06-13 MED ORDER — AMLODIPINE BESYLATE 5 MG PO TABS
5.0000 mg | ORAL_TABLET | Freq: Every day | ORAL | Status: DC
  Administered 2016-06-14 – 2016-06-19 (×6): 5 mg via ORAL
  Filled 2016-06-13 (×6): qty 1

## 2016-06-13 MED ORDER — FUROSEMIDE 10 MG/ML IJ SOLN
40.0000 mg | Freq: Once | INTRAMUSCULAR | Status: AC
  Administered 2016-06-13: 40 mg via INTRAVENOUS
  Filled 2016-06-13: qty 4

## 2016-06-13 MED ORDER — ALBUTEROL SULFATE (2.5 MG/3ML) 0.083% IN NEBU: 3.0000 mL | INHALATION_SOLUTION | Freq: Four times a day (QID) | RESPIRATORY_TRACT | Status: DC | PRN

## 2016-06-13 NOTE — ED Provider Notes (Signed)
[facility] DEPT Provider Note   CSN: [id number] Arrival date & time: 06/13/16  1750     History   Chief Complaint Chief Complaint  Patient presents with  . Shortness of Breath    HPI [name] is a 61 y.o. female.  Presents for evaluation of cough and shortness of breath present for 1 week. Cough is nonproductive. She her son both feel that she is "swollen all over". Also concerned about a sore spot on her right lower leg present for 2 weeks and are concerned that is infected. She has had previous infections, from MRSA that were quite serious, but their report. She has been eating well. She has not had fever, chest pain, focal weakness or paresthesias. She is taking her usual medications, without relief. There are no other known modifying factors.  HPI  Past Medical History:  Diagnosis Date  . Anxiety   . Depression   . GERD (gastroesophageal reflux disease)   . High cholesterol   . Hypertension   . Type II diabetes mellitus [facility])     Patient Active Problem List   Diagnosis Date Noted  . MRSA bacteremia 02/29/2016  . Pressure injury of skin 02/28/2016  . Hyperglycemia 02/27/2016  . Abscess of skin and subcutaneous tissue 02/27/2016  . Hyperosmolarity syndrome 02/27/2016  . h/o dialysis needs spring 2017 02/18/2016  . Paroxysmal SVT (supraventricular tachycardia) ([place]) 02/18/2016  . Emesis 02/18/2016  . Hypokalemia 02/17/2016  . Volume overload 02/02/2016  . Abdominal pain 02/01/2016  . CKD (chronic kidney disease), stage III 10/24/2015  . Abscess of axilla, right 10/23/2015  . HLD (hyperlipidemia) 10/12/2015  . Anemia of chronic disease 10/12/2015  . Depression 10/12/2015  . UTI (lower urinary tract infection) 09/12/2015  . Generalized exfoliative dermatitis 09/09/2015  . Normocytic anemia 09/09/2015  . Necrotizing fasciitis ([place]) of inner thigh 08/07/2015  . IDDM (insulin dependent diabetes mellitus) ([place]) 08/07/2015  . History of hypertension  08/07/2015  . Hypertension 08/07/2015    Past Surgical History:  Procedure Laterality Date  . APPLICATION OF WOUND VAC Right 10/25/2015   Procedure: APPLICATION OF WOUND VAC;  Surgeon: Aviva Signs, MD;  Location: [facility];  Service: General;  Laterality: Right;  . CESAREAN SECTION  1984  . INCISION AND DRAINAGE Right 08/09/2015   Procedure: INCISION AND DRAINAGE;  Surgeon: Arta Bruce Kinsinger, MD;  Location: [facility];  Service: General;  Laterality: Right;  . INCISION AND DRAINAGE ABSCESS Right 08/08/2015   Procedure: Irrigation and Debridement of Right Lower Ext.;  Surgeon: Ralene Ok, MD;  Location: [facility];  Service: General;  Laterality: Right;  . INCISION AND DRAINAGE ABSCESS Right 10/25/2015   Procedure: INCISION AND DRAINAGE ABSCESS RIGHT AXILLARY ABSCESS;  Surgeon: Aviva Signs, MD;  Location: [facility];  Service: General;  Laterality: Right;    OB History    Gravida Para Term Preterm AB Living   4 1 1   3      SAB TAB Ectopic Multiple Live Births   3               Home Medications    Prior to Admission medications   Medication Sig Start Date End Date Taking? Authorizing Provider  albuterol (PROAIR HFA) 108 (90 Base) MCG/ACT inhaler Inhale 2 puffs into the lungs every 6 (six) hours as needed for wheezing or shortness of breath.   Yes Historical Provider, MD  amLODipine (NORVASC) 5 MG tablet Take 1 tablet by mouth daily. 05/30/16  Yes Historical Provider, MD  aspirin EC 325 MG tablet Take 325 mg by mouth daily.   Yes Historical Provider, MD  FLUoxetine (PROZAC) 20 MG capsule Take 20 mg by mouth 2 (two) times daily.  05/11/15  Yes Historical Provider, MD  HYDROmorphone (DILAUDID) 4 MG tablet Take 4 mg by mouth 2 (two) times daily as needed. For pain 11/09/15  Yes Historical Provider, MD  insulin aspart (NOVOLOG FLEXPEN) 100 UNIT/ML FlexPen Inject 3 Units into the skin 3 (three) times daily with meals.   Yes Historical Provider, MD  insulin glargine (LANTUS) 100 UNIT/ML injection Inject  0.07 mLs (7 Units total) into the skin daily. 03/05/16  Yes Kathie Dike, MD  metoprolol tartrate (LOPRESSOR) 25 MG tablet Take 1 tablet (25 mg total) by mouth 2 (two) times daily. 09/21/15  Yes Thurnell Lose, MD  Multiple Vitamin (MULTIVITAMIN WITH MINERALS) TABS tablet Take 1 tablet by mouth daily. 10/26/15  Yes Estela Leonie Green, MD  pantoprazole (PROTONIX) 40 MG tablet Take 1 tablet (40 mg total) by mouth daily. 02/03/16  Yes Riccardo Dubin, MD  pravastatin (PRAVACHOL) 40 MG tablet Take 40 mg by mouth at bedtime.  05/11/15  Yes Historical Provider, MD  QUEtiapine (SEROQUEL) 25 MG tablet Take 1 tablet by mouth at bedtime.  12/22/15  Yes Historical Provider, MD  torsemide (DEMADEX) 20 MG tablet Take 1 tablet (20 mg total) by mouth daily as needed (swelling). 03/05/16  Yes Kathie Dike, MD  collagenase (SANTYL) ointment Apply in a nickel thickness to left achilles wound in the wound bed only, being careful not to get on surrounding skin.  Cover with dry gauze. 02/03/16   Riccardo Dubin, MD  iron polysaccharides (NIFEREX) 150 MG capsule Take 1 capsule (150 mg total) by mouth 2 (two) times daily. 03/05/16   Kathie Dike, MD  mupirocin ointment (BACTROBAN) 2 % Apply topically 3 (three) times daily. 03/05/16   Kathie Dike, MD  Vancomycin (VANCOCIN) 750-5 MG/150ML-% SOLN Inject 150 mLs (750 mg total) into the vein daily. Until 10/6 03/06/16   Kathie Dike, MD    Family History Family History  Problem Relation Age of Onset  . Hypertension Mother   . Diabetes Mother   . Kidney failure Father     Social History Social History  Substance Use Topics  . Smoking status: Current Every Day Smoker    Packs/day: 0.10    Years: 35.00    Types: Cigarettes  . Smokeless tobacco: Never Used  . Alcohol use 0.6 oz/week    1 Glasses of wine per week     Allergies   Other and Penicillins   Review of Systems Review of Systems  All other systems reviewed and are negative.    Physical  Exam Updated Vital Signs BP 168/90   Pulse 80   Temp 98 F (36.7 C) (Oral)   Resp 23   Ht 5' 3"  (1.6 m)   Wt 250 lb (113.4 kg)   SpO2 94%   BMI 44.29 kg/m   Physical Exam  Constitutional: She is oriented to person, place, and time. She appears well-developed.  Non-toxic appearance. She does not have a sickly appearance. She appears distressed (She requires help to sit forward, so I can auscultate the lungs.).  HENT:  Head: Normocephalic and atraumatic.  Eyes: Conjunctivae and EOM are normal. Pupils are equal, round, and reactive to light.  Neck: Normal range of motion and phonation normal. Neck supple.  Cardiovascular: Normal rate and regular rhythm.   Pulmonary/Chest: Effort normal.  She exhibits no tenderness.  Decreased air movement bilaterally.  Abdominal: Soft. She exhibits no distension. There is no tenderness. There is no guarding.  Musculoskeletal: Normal range of motion. She exhibits edema (1+ lower legs bilaterally.).  Fluctuant mass, right anterior shin, 20 cm above the ankle, consistent with local abscess. No proximal streaking, drainage or bleeding.  Neurological: She is alert and oriented to person, place, and time. She exhibits normal muscle tone.  Skin: Skin is warm and dry.  Psychiatric: She has a normal mood and affect. Her behavior is normal. Judgment and thought content normal.  Nursing note and vitals reviewed.    ED Treatments / Results  Labs (all labs ordered are listed, but only abnormal results are displayed) Labs Reviewed  COMPREHENSIVE METABOLIC PANEL - Abnormal; Notable for the following:       Result Value   Glucose, Bld 213 (*)    Creatinine, Ser 1.26 (*)    Calcium 8.4 (*)    Albumin 2.4 (*)    Alkaline Phosphatase 149 (*)    GFR calc non Af Amer 45 (*)    GFR calc Af Amer 52 (*)    All other components within normal limits  CBC WITH DIFFERENTIAL/PLATELET - Abnormal; Notable for the following:    RBC 3.54 (*)    Hemoglobin 10.9 (*)    HCT  34.1 (*)    RDW 16.7 (*)    All other components within normal limits  BRAIN NATRIURETIC PEPTIDE - Abnormal; Notable for the following:    B Natriuretic Peptide 1,011.0 (*)    All other components within normal limits  TROPONIN I  MAGNESIUM    EKG  EKG Interpretation None        Date: 06/13/16  Rate: 70  Rhythm: normal sinus rhythm  QRS Axis: left  PR and QT Intervals: prolonged QT  ST/T Wave abnormalities: normal  PR and QRS Conduction Disutrbances:right bundle branch block  Narrative Interpretation:   Old EKG Reviewed: none available   Radiology Dg Chest 2 View  Result Date: 06/13/2016 CLINICAL DATA:  Shortness of breath and mild cough. EXAM: CHEST  2 VIEW COMPARISON:  March 11, 2016 FINDINGS: Layering effusions are identified. Focal opacity suspected in the left lung base and patchy opacity seen in the right mid and lower lung. IMPRESSION: Findings worrisome for layering effusions and bilateral pulmonary infiltrates. Electronically Signed   By: Dorise Bullion III M.D   On: 06/13/2016 18:52    Procedures .Marland KitchenIncision and Drainage Date/Time: 06/13/2016 8:06 PM Performed by: Daleen Bo Authorized by: Daleen Bo   Consent:    Consent obtained:  Verbal   Consent given by:  Patient   Alternatives discussed:  Alternative treatment and observation Location:    Type:  Abscess   Location: right anterolateral lower leg. Pre-procedure details:    Skin preparation:  Antiseptic wash Anesthesia (see MAR for exact dosages):    Anesthesia method:  Local infiltration   Local anesthetic:  Lidocaine 1% w/o epi Procedure type:    Complexity:  Simple Procedure details:    Needle aspiration: no     Incision types:  Single straight   Incision depth:  Dermal   Scalpel blade:  11   Wound management:  Probed and deloculated   Drainage:  Purulent   Drainage amount:  Scant   Wound treatment:  Wound left open   Packing materials:  None Post-procedure details:    Patient  tolerance of procedure:  Tolerated well, no immediate complications   (  including critical care time)  Medications Ordered in ED Medications  povidone-iodine (BETADINE) 10 % external solution (not administered)  furosemide (LASIX) injection 40 mg (not administered)  lidocaine (PF) (XYLOCAINE) 1 % injection 5 mL (5 mLs Infiltration Given 06/13/16 1921)     Initial Impression / Assessment and Plan / ED Course  I have reviewed the triage vital signs and the nursing notes.  Pertinent labs & imaging results that were available during my care of the patient were reviewed by me and considered in my medical decision making (see chart for details).  Clinical Course as of Jun 13 2008  Thu Jun 13, 2016  1940 Elevated B Natriuretic Peptide: (!) 1,011.0 [EW]  1940 High Glucose: (!) 213 [EW]  1940 High Creatinine: (!) 1.26 [EW]  1940 Low Albumin: (!) 2.4 [EW]  1940 Low EGFR (African American): (!) 52 [EW]  1940 Low Hemoglobin: (!) 10.9 [EW]  1941 Consistent with acute CHF DG Chest 2 View [EW]    Clinical Course User Index [EW] Daleen Bo, MD    Medications  povidone-iodine (BETADINE) 10 % external solution (not administered)  furosemide (LASIX) injection 40 mg (not administered)  lidocaine (PF) (XYLOCAINE) 1 % injection 5 mL (5 mLs Infiltration Given 06/13/16 1921)    Patient Vitals for the past 24 hrs:  BP Temp Temp src Pulse Resp SpO2 Height Weight  06/13/16 1900 168/90 - - 80 23 94 % - -  06/13/16 1815 - - - 79 13 - - -  06/13/16 1800 191/92 - - - 13 - - -  06/13/16 1743 - - - - - 98 % 5' 3"  (1.6 m) 250 lb (113.4 kg)  06/13/16 1742 194/91 98 F (36.7 C) Oral 84 18 98 % - -    7:45 PM Reevaluation with update and discussion. After initial assessment and treatment, an updated evaluation reveals No change in clinical status. Findings discussed with patient and all questions answered. Jatziri Goffredo L   7:46 PM-Consult complete with Dr. Marin Comment. Patient case explained and discussed. He  agrees to admit patient for further evaluation and treatment. Call ended at 2002  Final Clinical Impressions(s) / ED Diagnoses   Final diagnoses:  Acute congestive heart failure, unspecified congestive heart failure type ([place])  Abscess   Evaluation consistent with acute CHF. Patient's last cardiac echo done this year showed EF of 55%. She will need diuresis, and repeat echo imaging. She is hemodynamically stable. Doubt ACS, PE or pneumonia.On associated right lower leg subcutaneous abscess, treated with incision and drainage. Small amount of fluid obtained, purulent in consistency; wound left open to heal by secondary intention, and to allow drainage.    Nursing Notes Reviewed/ Care Coordinated Applicable Imaging Reviewed Interpretation of Laboratory Data incorporated into ED treatment   Plan: Admit  New Prescriptions New Prescriptions   No medications on file     Daleen Bo, MD 06/13/16 2012

## 2016-06-13 NOTE — H&P (Signed)
History and Physical    AILY TZENG ZOX:[id number] DOB: 1956/05/21 DOA: 06/13/2016  PCP: [name], MD  Patient coming from: Home.    Chief Complaint:  Progressive SOB and Orthopnea over the last week.   HPI: [name] is an 61 y.o. female whom I admitted in Sept 2017 for hyperosmolar, hx of DM, HTN, HLD, COPD, CKD3, recurrent cellulitis with MRSA, s/p IV vancomycin previously, presented to the ER with progressive SOB and orthopnea with leg swelling for the past week.  She denied productive coughs, fever, chills, or chest pain.  She had hx of CKD in the past, and had been seeing Dr Adalberto Cole.  Evaluation in the ER included a BNP of 1200, normal WBC and Hb of 11g per dL,. Cr of 1.26, and CXR showed bilateral infiltrates with mild effusion.  Her troponin was negative, and EKG showed small voltage, RBBB but no ischemic changes.  ECHO in 9/17 showed EF 60 percent with small pericardial effusion and Grade I diastolic dysfx.  She was given IV Lasix, and placed on oxygen.  She also has a small abscess on her right lateral leg, and it was I and D in the ER.  She has hx of MRSA bacteremia.  Hospitalist was asked to admit her for CHF and cellulitis.    ED Course:  See above.  Past Medical History:  Diagnosis Date  . Anxiety   . Depression   . GERD (gastroesophageal reflux disease)   . High cholesterol   . Hypertension   . Type II diabetes mellitus (HCC)     Review of Systems: As per HPI otherwise 10 point review of systems negative.    Past Surgical History:  Procedure Laterality Date  . APPLICATION OF WOUND VAC Right 10/25/2015   Procedure: APPLICATION OF WOUND VAC;  Surgeon: Franky Macho, MD;  Location: [facility];  Service: General;  Laterality: Right;  . CESAREAN SECTION  1984  . INCISION AND DRAINAGE Right 08/09/2015   Procedure: INCISION AND DRAINAGE;  Surgeon: De Blanch Kinsinger, MD;  Location: [facility] OR;  Service: General;  Laterality: Right;  . INCISION AND DRAINAGE  ABSCESS Right 08/08/2015   Procedure: Irrigation and Debridement of Right Lower Ext.;  Surgeon: Axel Filler, MD;  Location: [facility];  Service: General;  Laterality: Right;  . INCISION AND DRAINAGE ABSCESS Right 10/25/2015   Procedure: INCISION AND DRAINAGE ABSCESS RIGHT AXILLARY ABSCESS;  Surgeon: Franky Macho, MD;  Location: [facility];  Service: General;  Laterality: Right;     reports that she has been smoking Cigarettes.  She has a 3.50 pack-year smoking history. She has never used smokeless tobacco. She reports that she drinks about 0.6 oz of alcohol per week . She reports that she does not use drugs.  Allergies  Allergen Reactions  . Other     Per pt some type of mycin "some antibiotic sent me into renal failure."  . Penicillins Rash    Tolerated Zosyn in 07/2015 Has patient had a PCN reaction causing immediate rash, facial/tongue/throat swelling, SOB or lightheadedness with hypotension: No Has patient had a PCN reaction causing severe rash involving mucus membranes or skin necrosis: No Has patient had a PCN reaction that required hospitalization No Has patient had a PCN reaction occurring within the last 10 years: No If all of the above answers are "NO", then may proceed with Cephalosporin use.     Family History  Problem Relation Age of Onset  . Hypertension Mother   . Diabetes  Mother   . Kidney failure Father      Prior to Admission medications   Medication Sig Start Date End Date Taking? Authorizing Provider  albuterol (PROAIR HFA) 108 (90 Base) MCG/ACT inhaler Inhale 2 puffs into the lungs every 6 (six) hours as needed for wheezing or shortness of breath.   Yes Historical Provider, MD  amLODipine (NORVASC) 5 MG tablet Take 1 tablet by mouth daily. 05/30/16  Yes Historical Provider, MD  aspirin EC 325 MG tablet Take 325 mg by mouth daily.   Yes Historical Provider, MD  FLUoxetine (PROZAC) 20 MG capsule Take 20 mg by mouth 2 (two) times daily.  05/11/15  Yes Historical Provider,  MD  HYDROmorphone (DILAUDID) 4 MG tablet Take 4 mg by mouth 2 (two) times daily as needed. For pain 11/09/15  Yes Historical Provider, MD  insulin aspart (NOVOLOG FLEXPEN) 100 UNIT/ML FlexPen Inject 3 Units into the skin 3 (three) times daily with meals.   Yes Historical Provider, MD  insulin glargine (LANTUS) 100 UNIT/ML injection Inject 0.07 mLs (7 Units total) into the skin daily. 03/05/16  Yes Erick Blinks, MD  metoprolol tartrate (LOPRESSOR) 25 MG tablet Take 1 tablet (25 mg total) by mouth 2 (two) times daily. 09/21/15  Yes Leroy Sea, MD  Multiple Vitamin (MULTIVITAMIN WITH MINERALS) TABS tablet Take 1 tablet by mouth daily. 10/26/15  Yes Estela Isaiah Blakes, MD  pantoprazole (PROTONIX) 40 MG tablet Take 1 tablet (40 mg total) by mouth daily. 02/03/16  Yes Beather Arbour, MD  pravastatin (PRAVACHOL) 40 MG tablet Take 40 mg by mouth at bedtime.  05/11/15  Yes Historical Provider, MD  QUEtiapine (SEROQUEL) 25 MG tablet Take 1 tablet by mouth at bedtime.  12/22/15  Yes Historical Provider, MD  torsemide (DEMADEX) 20 MG tablet Take 1 tablet (20 mg total) by mouth daily as needed (swelling). 03/05/16  Yes Erick Blinks, MD  collagenase (SANTYL) ointment Apply in a nickel thickness to left achilles wound in the wound bed only, being careful not to get on surrounding skin.  Cover with dry gauze. 02/03/16   Beather Arbour, MD  iron polysaccharides (NIFEREX) 150 MG capsule Take 1 capsule (150 mg total) by mouth 2 (two) times daily. 03/05/16   Erick Blinks, MD  mupirocin ointment (BACTROBAN) 2 % Apply topically 3 (three) times daily. 03/05/16   Erick Blinks, MD  Vancomycin (VANCOCIN) 750-5 MG/150ML-% SOLN Inject 150 mLs (750 mg total) into the vein daily. Until 10/6 03/06/16   Erick Blinks, MD    Physical Exam: Vitals:   06/13/16 1815 06/13/16 1900 06/13/16 1930 06/13/16 2000  BP:  168/90 174/90 167/89  Pulse: 79 80 79 84  Resp: 13 23 11 14   Temp:      TempSrc:      SpO2:  94% 92% 97%    Weight:      Height:          Constitutional: NAD, calm, comfortable Vitals:   06/13/16 1815 06/13/16 1900 06/13/16 1930 06/13/16 2000  BP:  168/90 174/90 167/89  Pulse: 79 80 79 84  Resp: 13 23 11 14   Temp:      TempSrc:      SpO2:  94% 92% 97%  Weight:      Height:       Eyes: PERRL, lids and conjunctivae normal ENMT: Mucous membranes are moist. Posterior pharynx clear of any exudate or lesions.Normal dentition.  Neck: normal, supple, no masses, no thyromegaly Respiratory: bilateral rales at  bases,  no wheezing,  Normal respiratory effort. No accessory muscle use.  Cardiovascular: Regular rate and rhythm, no murmurs / rubs / gallops. 2+ ede,a/ 2+ pedal pulses. No carotid bruits.  Abdomen: no tenderness, no masses palpated. No hepatosplenomegaly. Bowel sounds positive.  Musculoskeletal: no clubbing / cyanosis. No joint deformity upper and lower extremities. Good ROM, no contractures. Normal muscle tone.  Skin: no rashes, lesions, ulcers. No induration Neurologic: CN 2-12 grossly intact. Sensation intact, DTR normal. Strength 5/5 in all 4.  Psychiatric: Normal judgment and insight. Alert and oriented x 3. Normal mood.     Labs on Admission: I have personally reviewed following labs and imaging studies  CBC:  Recent Labs Lab 06/13/16 1851  WBC 6.2  NEUTROABS 4.5  HGB 10.9*  HCT 34.1*  MCV 96.3  PLT 285   Basic Metabolic Panel:  Recent Labs Lab 06/13/16 1851  NA 141  K 3.8  CL 104  CO2 29  GLUCOSE 213*  BUN 16  CREATININE 1.26*  CALCIUM 8.4*  MG 2.0   GFR: Estimated Creatinine Clearance: 56.8 mL/min (by C-G formula based on SCr of 1.26 mg/dL (H)). Liver Function Tests:  Recent Labs Lab 06/13/16 1851  AST 34  ALT 18  ALKPHOS 149*  BILITOT 0.5  PROT 7.0  ALBUMIN 2.4*    Recent Labs Lab 06/13/16 1851  TROPONINI <0.03   Urine analysis:    Component Value Date/Time   COLORURINE YELLOW 02/27/2016 1555   APPEARANCEUR HAZY (A) 02/27/2016  1555   LABSPEC <1.005 (L) 02/27/2016 1555   PHURINE 5.0 02/27/2016 1555   GLUCOSEU >1000 (A) 02/27/2016 1555   HGBUR SMALL (A) 02/27/2016 1555   BILIRUBINUR NEGATIVE 02/27/2016 1555   KETONESUR NEGATIVE 02/27/2016 1555   PROTEINUR NEGATIVE 02/27/2016 1555   NITRITE NEGATIVE 02/27/2016 1555   LEUKOCYTESUR NEGATIVE 02/27/2016 1555    Radiological Exams on Admission: Dg Chest 2 View  Result Date: 06/13/2016 CLINICAL DATA:  Shortness of breath and mild cough. EXAM: CHEST  2 VIEW COMPARISON:  March 11, 2016 FINDINGS: Layering effusions are identified. Focal opacity suspected in the left lung base and patchy opacity seen in the right mid and lower lung. IMPRESSION: Findings worrisome for layering effusions and bilateral pulmonary infiltrates. Electronically Signed   By: Gerome Samavid  Williams III M.D   On: 06/13/2016 18:52    EKG: Independently reviewed.  Assessment/Plan Principal Problem:   Volume overload Active Problems:   IDDM (insulin dependent diabetes mellitus) (HCC)   History of hypertension   HLD (hyperlipidemia)   CKD (chronic kidney disease), stage III   Paroxysmal SVT (supraventricular tachycardia) (H[facility])   Abscess of skin and subcutaneous tissue   CHF (congestive heart failure) (H[facility])    PLAN:   CHF: I think she has volume overload.  Will continue with IV Lasix.  Follow Cr and electrolytes daily.  Will obtain ECHO to see how much pericardial effusion has she, given low voltage on her EKG.  She has no sign of acute tamponade.  BP is high with no narrow pulse pressure.  Safe to continue with diuresis.  Cellulitis:  Given her known hx of MRSA bacteremia, will give IV Zenaida NieceVan for her leg cellulitis, pharmacy dosing.  IDDM:  Continue with her home regimen.  Add SSI.  Give carb modified diet.  CKD:  No ACE I.  Follow Cr carefully.     HTN:  Continue with BP meds.  HLD:  Continue with statin.    DVT prophylaxis: SUBQ Hepa.  Code Status:  FULL CODE.  Family Communication: None at  bedside.  Disposition Plan: to home when better.  Consults called: None.  Admission status: Inpatient.    Deondria Puryear MD FACP. [facility]  If 7PM-7AM, please contact night-coverage [email or web address] Password [facility]  06/13/2016, 8:18 PM

## 2016-06-13 NOTE — Progress Notes (Signed)
Pharmacy Antibiotic Note  Sophia EllisonGenevieve A [name] is a 61 y.o. female admitted on 06/13/2016 with cellulitis.  Pharmacy has been consulted for Vancomycin dosing.  Plan: Vancomycin 2000mg  loading dose, then 1250mg   IV every 24 hours.  Goal trough 10-15 mcg/mL. F/U cultures and clinical progress Vancomycin levels as indicated Height: 5\' 3"  (160 cm) Weight: 250 lb (113.4 kg) IBW/kg (Calculated) : 52.4  Temp (24hrs), Avg:98 F (36.7 C), Min:98 F (36.7 C), Max:98 F (36.7 C)   Recent Labs Lab 06/13/16 1851  WBC 6.2  CREATININE 1.26*    Estimated Creatinine Clearance: 56.8 mL/min (by C-G formula based on SCr of 1.26 mg/dL (H)).    Allergies  Allergen Reactions  . Other     Per pt some type of mycin "some antibiotic sent me into renal failure."  . Penicillins Rash    Tolerated Zosyn in 07/2015 Has patient had a PCN reaction causing immediate rash, facial/tongue/throat swelling, SOB or lightheadedness with hypotension: No Has patient had a PCN reaction causing severe rash involving mucus membranes or skin necrosis: No Has patient had a PCN reaction that required hospitalization No Has patient had a PCN reaction occurring within the last 10 years: No If all of the above answers are "NO", then may proceed with Cephalosporin use.     Antimicrobials this admission: Vancomycin 1/4 >>   Dose adjustments this admission: n/a  Thank you for allowing pharmacy to be a part of this patient's care. Elder CyphersLorie Ketra Duchesne, BS Loura Backharm D, [place]PS Clinical Pharmacist Pager [phone] 06/13/2016 10:48 PM

## 2016-06-13 NOTE — ED Triage Notes (Signed)
Pt came by EMS. Pt states being SOB for a week and today progressively got worse. No Oxygen at home. Pt states using inhaler at home.

## 2016-06-13 NOTE — ED Notes (Signed)
Cleaned I & D area to right anterior lower leg, applied non-adherent to dry dressing to wound, patient tolerated well.

## 2016-06-14 ENCOUNTER — Inpatient Hospital Stay (HOSPITAL_COMMUNITY): Payer: BLUE CROSS/BLUE SHIELD

## 2016-06-14 DIAGNOSIS — J9601 Acute respiratory failure with hypoxia: Secondary | ICD-10-CM

## 2016-06-14 DIAGNOSIS — I509 Heart failure, unspecified: Secondary | ICD-10-CM

## 2016-06-14 DIAGNOSIS — Z794 Long term (current) use of insulin: Secondary | ICD-10-CM

## 2016-06-14 DIAGNOSIS — Z8679 Personal history of other diseases of the circulatory system: Secondary | ICD-10-CM

## 2016-06-14 DIAGNOSIS — I313 Pericardial effusion (noninflammatory): Secondary | ICD-10-CM

## 2016-06-14 DIAGNOSIS — E119 Type 2 diabetes mellitus without complications: Secondary | ICD-10-CM

## 2016-06-14 DIAGNOSIS — E876 Hypokalemia: Secondary | ICD-10-CM

## 2016-06-14 LAB — ECHOCARDIOGRAM COMPLETE
CHL CUP MV DEC (S): 254
E decel time: 254 msec
E/e' ratio: 8.33
FS: 18 % — AB (ref 28–44)
HEIGHTINCHES: 63 in
IVS/LV PW RATIO, ED: 0.63
LA diam index: 1.44 cm/m2
LA vol A4C: 43.2 ml
LASIZE: 32 mm
LDCA: 3.14 cm2
LEFT ATRIUM END SYS DIAM: 32 mm
LV E/e' medial: 8.33
LV E/e'average: 8.33
LV PW d: 20.6 mm — AB (ref 0.6–1.1)
LV TDI E'LATERAL: 10.8
LVELAT: 10.8 cm/s
LVOTD: 20 mm
MV pk A vel: 113 m/s
MVPG: 3 mmHg
MVPKEVEL: 90 m/s
RV TAPSE: 20.8 mm
Reg peak vel: 261 cm/s
TDI e' medial: 6.9
TR max vel: 261 cm/s
WEIGHTICAEL: 4414.4 [oz_av]

## 2016-06-14 LAB — GLUCOSE, CAPILLARY
GLUCOSE-CAPILLARY: 125 mg/dL — AB (ref 65–99)
GLUCOSE-CAPILLARY: 50 mg/dL — AB (ref 65–99)
GLUCOSE-CAPILLARY: 78 mg/dL (ref 65–99)
GLUCOSE-CAPILLARY: 48 mg/dL — AB (ref 65–99)
GLUCOSE-CAPILLARY: 57 mg/dL — AB (ref 65–99)
Glucose-Capillary: 178 mg/dL — ABNORMAL HIGH (ref 65–99)
Glucose-Capillary: 102 mg/dL — ABNORMAL HIGH (ref 65–99)

## 2016-06-14 LAB — BASIC METABOLIC PANEL
Anion gap: 8 (ref 5–15)
BUN: 14 mg/dL (ref 6–20)
CO2: 29 mmol/L (ref 22–32)
CREATININE: 1.13 mg/dL — AB (ref 0.44–1.00)
Calcium: 8.1 mg/dL — ABNORMAL LOW (ref 8.9–10.3)
Chloride: 105 mmol/L (ref 101–111)
GFR calc Af Amer: 60 mL/min — ABNORMAL LOW (ref >60)
GFR, EST NON AFRICAN AMERICAN: 51 mL/min — AB (ref >60)
GLUCOSE: 190 mg/dL — AB (ref 65–99)
Potassium: 2.6 mmol/L — CL (ref 3.5–5.1)
SODIUM: 142 mmol/L (ref 135–145)

## 2016-06-14 LAB — MRSA PCR SCREENING: MRSA BY PCR: NEGATIVE

## 2016-06-14 MED ORDER — POTASSIUM CHLORIDE CRYS ER 20 MEQ PO TBCR: 40.0000 meq | EXTENDED_RELEASE_TABLET | Freq: Every day | ORAL | Status: DC

## 2016-06-14 MED ORDER — FUROSEMIDE 10 MG/ML IJ SOLN
60.0000 mg | Freq: Three times a day (TID) | INTRAMUSCULAR | Status: DC
  Administered 2016-06-14 – 2016-06-18 (×12): 60 mg via INTRAVENOUS
  Filled 2016-06-14 (×13): qty 6

## 2016-06-14 MED ORDER — POTASSIUM CHLORIDE CRYS ER 20 MEQ PO TBCR
40.0000 meq | EXTENDED_RELEASE_TABLET | Freq: Two times a day (BID) | ORAL | Status: DC
  Administered 2016-06-14 – 2016-06-19 (×10): 40 meq via ORAL
  Filled 2016-06-14 (×10): qty 2

## 2016-06-14 MED ORDER — POTASSIUM CHLORIDE 10 MEQ/100ML IV SOLN
10.0000 meq | INTRAVENOUS | Status: AC
  Administered 2016-06-14 (×3): 10 meq via INTRAVENOUS
  Filled 2016-06-14 (×3): qty 100

## 2016-06-14 NOTE — Progress Notes (Signed)
Hypoglycemic Event  CBG: 50  Treatment: 15 GM carbohydrate snack  Symptoms: None  Follow-up CBG: Time: 1750 CBG Result: 78  Possible Reasons for Event: Inadequate meal intake  Comments/MD notified:Dr. Ronalee BeltsBhandari notified, continue to monitor    [name]

## 2016-06-14 NOTE — Progress Notes (Signed)
*  PRELIMINARY RESULTS* Echocardiogram 2D Echocardiogram has been performed.  Jeryl Columbialliott, Angelena Sand 06/14/2016, 4:39 PM

## 2016-06-14 NOTE — Care Management Note (Signed)
Case Management Note  Patient Details  Name: [name] MRN: [id number] Date of Birth: [birth year]  Subjective/Objective:                  Pt admitted with CHF. She is from home, lives with friend and is ind with ADL's. She has PCP, transportation and no difficulty affording medications. She plans to return home with self care at DC. She has walker to use if she needs it. She is on home oxygen PTA.   Action/Plan: CM will cont to follow pt progress, ? Need for HH, ? Need for supplemental oxygen.   Expected Discharge Date:     06/17/2016             Expected Discharge Plan:  Home/Self Care  In-House Referral:  NA  Discharge planning Services  CM Consult  Post Acute Care Choice:  NA Choice offered to:  NA  Status of Service:  In process, will continue to follow  Malcolm MetroChildress, Yessica Putnam Demske, RN 06/14/2016, 3:09 PM

## 2016-06-14 NOTE — Progress Notes (Signed)
PROGRESS NOTE    [name]  OZH:[id number] DOB: 07-Aug-1955 DOA: 06/13/2016 PCP: [name], MD   Brief Narrative: 61 y.o. female with hx of DM, HTN, HLD, COPD, CKD3, recurrent cellulitis with MRSA, s/p IV vancomycin previously, presented to the ER with progressive SOB and orthopnea with leg swelling for the past week.She had hx of CKD in the past, and had been seeing Dr Adalberto Cole.She also has a small abscess on her right lateral leg, and it was I and D in the ER.  She has hx of MRSA bacteremia.  Assessment & Plan:   Principal Problem:   Volume overload Active Problems:   IDDM (insulin dependent diabetes mellitus) (HCC)   History of hypertension   HLD (hyperlipidemia)   CKD (chronic kidney disease), stage III   Paroxysmal SVT (supraventricular tachycardia) (HCC)   Abscess of skin and subcutaneous tissue   CHF (congestive heart failure) (HCC)  #Possible acute diastolic congestive heart failure: Patient presented with worsening dyspnea on exertion, anasarca, elevated BNP. Patient was not consistent with diuretics at home and with diet. -Patient had echocardiogram in September 2017 consistent with left ventricular EF 55-60% grade 1 diastolic dysfunction. Also with only hypertension and pleural effusion. -Patient has anasarca therefore I increased the dose of Lasix to 60 mg every 8 hourly, insert  Foley catheter, low-salt diet, -Given large pericardial effusion in recent echocardiogram, repeating echo.  #Right lower extremity cellulitis: Status post I&D in the ER as per medical record. Patient has history of MRSA. Continue IV vancomycin.  #Acute hypoxic respiratory failure likely in the setting of acute pulmonary edema/CHF. Currently on 4 L of oxygen. Try to wean off oxygen gradually.  #Hypokalemia likely in the setting of diuretics: Repleted IV and oral potassium chloride. Monitor labs. Check magnesium level.  #Chronic kidney disease, stage III: Reportedly patient required  hemodialysis in the past. Currently her serum creatinine level is lower than baseline wondering if it is due to fluid overload. Monitor BMP and urine output.  #Diabetes: Monitor blood sugar level. Continue insulin.  #hypertension: Blood pressure acceptable continue Norvasc, metoprolol, diuretics  #Hyperlipidemia: Continue statin.  DVT prophylaxis: Heparin subcutaneous Code Status: Full code Family Communication: Patient's close friend at bedside Disposition Plan: Likely discharge home in 2-3 days    Consultants:   None  Procedures: None Antimicrobials: IV vancomycin  Subjective: Patient was seen and examined at bedside. Reported shortness of breath and dyspnea and exertion. Feels weak. Denied chest pain, nausea, vomiting, dizziness.   Objective: Vitals:   06/13/16 2130 06/13/16 2200 06/13/16 2316 06/14/16 0613  BP: 165/96 (!) 178/102 (!) 166/90 (!) 149/68  Pulse: 80 81 79 75  Resp: 14 17 (!) 24 (!) 22  Temp:   98.5 F (36.9 C) 98 F (36.7 C)  TempSrc:   Oral Oral  SpO2: 94% 95% 92% 94%  Weight:    125.1 kg (275 lb 14.4 oz)  Height:   5\' 3"  (1.6 m)     Intake/Output Summary (Last 24 hours) at 06/14/16 1327 Last data filed at 06/14/16 1016  Gross per 24 hour  Intake             1150 ml  Output              200 ml  Net              950 ml   Filed Weights   06/13/16 1743 06/14/16 0613  Weight: 113.4 kg (250 lb) 125.1 kg (275 lb 14.4 oz)  Examination:  General exam: Appears calm and comfortable. Has anasarca Respiratory system: Bibasal decreased breath sound. Respiratory effort normal. No wheezing or crackle Cardiovascular system: S1 & S2 heard, RRR.  Bilateral lower extremity pitting edema, chronic in nature Gastrointestinal system: Abdomen is distended and edematous abdominal wall. Nontender. Bowel sound positive Central nervous system: Alert and oriented. No focal neurological deficits. Extremities: Symmetric 5 x 5 power. Skin: No rashes, lesions or  ulcers Psychiatry: Judgement and insight appear normal. Mood & affect appropriate.     Data Reviewed: I have personally reviewed following labs and imaging studies  CBC:  Recent Labs Lab 06/13/16 1851  WBC 6.2  NEUTROABS 4.5  HGB 10.9*  HCT 34.1*  MCV 96.3  PLT 285   Basic Metabolic Panel:  Recent Labs Lab 06/13/16 1851 06/14/16 0535  NA 141 142  K 3.8 2.6*  CL 104 105  CO2 29 29  GLUCOSE 213* 190*  BUN 16 14  CREATININE 1.26* 1.13*  CALCIUM 8.4* 8.1*  MG 2.0  --    GFR: Estimated Creatinine Clearance: 67.3 mL/min (by C-G formula based on SCr of 1.13 mg/dL (H)). Liver Function Tests:  Recent Labs Lab 06/13/16 1851  AST 34  ALT 18  ALKPHOS 149*  BILITOT 0.5  PROT 7.0  ALBUMIN 2.4*   No results for input(s): LIPASE, AMYLASE in the last 168 hours. No results for input(s): AMMONIA in the last 168 hours. Coagulation Profile: No results for input(s): INR, PROTIME in the last 168 hours. Cardiac Enzymes:  Recent Labs Lab 06/13/16 1851  TROPONINI <0.03   BNP (last 3 results) No results for input(s): PROBNP in the last 8760 hours. HbA1C: No results for input(s): HGBA1C in the last 72 hours. CBG:  Recent Labs Lab 06/13/16 2312 06/14/16 0749 06/14/16 1111  GLUCAP 186* 178* 125*   Lipid Profile: No results for input(s): CHOL, HDL, LDLCALC, TRIG, CHOLHDL, LDLDIRECT in the last 72 hours. Thyroid Function Tests: No results for input(s): TSH, T4TOTAL, FREET4, T3FREE, THYROIDAB in the last 72 hours. Anemia Panel: No results for input(s): VITAMINB12, FOLATE, FERRITIN, TIBC, IRON, RETICCTPCT in the last 72 hours. Sepsis Labs: No results for input(s): PROCALCITON, LATICACIDVEN in the last 168 hours.  Recent Results (from the past 240 hour(s))  MRSA PCR Screening     Status: None   Collection Time: 06/13/16 10:50 PM  Result Value Ref Range Status   MRSA by PCR NEGATIVE NEGATIVE Final    Comment:        The GeneXpert MRSA Assay (FDA approved for NASAL  specimens only), is one component of a comprehensive MRSA colonization surveillance program. It is not intended to diagnose MRSA infection nor to guide or monitor treatment for MRSA infections.          Radiology Studies: Dg Chest 2 View  Result Date: 06/13/2016 CLINICAL DATA:  Shortness of breath and mild cough. EXAM: CHEST  2 VIEW COMPARISON:  March 11, 2016 FINDINGS: Layering effusions are identified. Focal opacity suspected in the left lung base and patchy opacity seen in the right mid and lower lung. IMPRESSION: Findings worrisome for layering effusions and bilateral pulmonary infiltrates. Electronically Signed   By: Gerome Samavid  Williams III M.D   On: 06/13/2016 18:52        Scheduled Meds: . amLODipine  5 mg Oral Daily  . aspirin EC  325 mg Oral Daily  . FLUoxetine  20 mg Oral BID  . furosemide  60 mg Intravenous Q8H  . heparin  5,000 Units  Subcutaneous Q8H  . insulin aspart  0-15 Units Subcutaneous TID WC  . insulin aspart  0-5 Units Subcutaneous QHS  . insulin aspart  3 Units Subcutaneous TID WC  . insulin glargine  7 Units Subcutaneous Daily  . iron polysaccharides  150 mg Oral BID  . metoprolol tartrate  25 mg Oral BID  . multivitamin with minerals  1 tablet Oral Daily  . pantoprazole  40 mg Oral Daily  . potassium chloride  40 mEq Oral BID  . pravastatin  40 mg Oral QHS  . QUEtiapine  25 mg Oral QHS  . sodium chloride flush  3 mL Intravenous Q12H  . vancomycin  1,250 mg Intravenous Q24H   Continuous Infusions:   LOS: 1 day    Amarilis Belflower Jaynie Collins, MD [facility] Pager [phone]  If 7PM-7AM, please contact night-coverage [email or web address] Password [email or web address] 06/14/2016, 1:27 PM

## 2016-06-15 DIAGNOSIS — L02419 Cutaneous abscess of limb, unspecified: Secondary | ICD-10-CM

## 2016-06-15 LAB — GLUCOSE, CAPILLARY
GLUCOSE-CAPILLARY: 206 mg/dL — AB (ref 65–99)
GLUCOSE-CAPILLARY: 142 mg/dL — AB (ref 65–99)
Glucose-Capillary: 135 mg/dL — ABNORMAL HIGH (ref 65–99)
Glucose-Capillary: 79 mg/dL (ref 65–99)

## 2016-06-15 LAB — BASIC METABOLIC PANEL
ANION GAP: 6 (ref 5–15)
BUN: 12 mg/dL (ref 6–20)
CALCIUM: 8.1 mg/dL — AB (ref 8.9–10.3)
CHLORIDE: 105 mmol/L (ref 101–111)
CO2: 34 mmol/L — AB (ref 22–32)
CREATININE: 1.14 mg/dL — AB (ref 0.44–1.00)
GFR calc non Af Amer: 51 mL/min — ABNORMAL LOW (ref >60)
GFR, EST AFRICAN AMERICAN: 59 mL/min — AB (ref >60)
Glucose, Bld: 174 mg/dL — ABNORMAL HIGH (ref 65–99)
Potassium: 2.8 mmol/L — ABNORMAL LOW (ref 3.5–5.1)
SODIUM: 145 mmol/L (ref 135–145)

## 2016-06-15 LAB — MAGNESIUM: MAGNESIUM: 1.6 mg/dL — AB (ref 1.7–2.4)

## 2016-06-15 MED ORDER — MAGNESIUM SULFATE 2 GM/50ML IV SOLN
2.0000 g | Freq: Once | INTRAVENOUS | Status: AC
  Administered 2016-06-15: 2 g via INTRAVENOUS
  Filled 2016-06-15: qty 50

## 2016-06-15 MED ORDER — SODIUM CHLORIDE 0.9 % IV SOLN: 30.0000 meq | Freq: Once | INTRAVENOUS | Status: DC

## 2016-06-15 MED ORDER — DIPHENHYDRAMINE HCL 25 MG PO CAPS
25.0000 mg | ORAL_CAPSULE | Freq: Once | ORAL | Status: AC
  Administered 2016-06-15: 25 mg via ORAL
  Filled 2016-06-15: qty 1

## 2016-06-15 MED ORDER — DOXYCYCLINE HYCLATE 100 MG PO TABS
100.0000 mg | ORAL_TABLET | Freq: Two times a day (BID) | ORAL | Status: DC
  Administered 2016-06-15 – 2016-06-19 (×8): 100 mg via ORAL
  Filled 2016-06-15 (×8): qty 1

## 2016-06-15 MED ORDER — POTASSIUM CHLORIDE 10 MEQ/100ML IV SOLN
10.0000 meq | INTRAVENOUS | Status: AC
  Administered 2016-06-15 (×3): 10 meq via INTRAVENOUS
  Filled 2016-06-15 (×3): qty 100

## 2016-06-15 NOTE — Progress Notes (Signed)
Hypoglycemic Event  CBG: 48  Treatment: 15 gm Carbohydrate Snack  Symptoms: None  Follow-up CBG: Time: 22: 00  CBG Result:102  Possible Reasons for Event: Inadequate meal intake  Comments/MD notified: Protocol followed, MD aware of events    Felecia JanHamilton, Tycho Cheramie D

## 2016-06-15 NOTE — Progress Notes (Signed)
PROGRESS NOTE    [name]  ZOX:[id number] DOB: September 04, 1955 DOA: 06/13/2016 PCP: Louie BostonAPPER,[name], MD   Brief Narrative: 61 y.o. female with hx of DM, HTN, HLD, COPD, CKD3, recurrent cellulitis with MRSA, s/p IV vancomycin previously, presented to the ER with progressive SOB and orthopnea with leg swelling for the past week.She had hx of CKD in the past, and had been seeing Dr Adalberto ColeBelfakadu.She also has a small abscess on her right lateral leg, and it was I and D in the ER.  She has hx of MRSA bacteremia.  Assessment & Plan:   Principal Problem:   Volume overload Active Problems:   IDDM (insulin dependent diabetes mellitus) (HCC)   History of hypertension   HLD (hyperlipidemia)   CKD (chronic kidney disease), stage III   Paroxysmal SVT (supraventricular tachycardia) (HCC)   Abscess of skin and subcutaneous tissue   Acute congestive heart failure (HCC)  #Possible acute diastolic congestive heart failure: Patient presented with worsening dyspnea on exertion, anasarca, elevated BNP. -Patient had echocardiogram in September 2017 consistent with left ventricular EF 55-60% grade 1 diastolic dysfunction. - -Patient has anasarca, responding well with IV Lasix. Patient is negative by almost 3 L. Follow-up labs. Monitor urine output. -Repeat echocardiogram showed normal left ventricular systolic function with ejection fraction of 60-65%. Small pericardial effusion.  #Right lower extremity cellulitis: Status post I&D in the ER as per medical record. Patient has history of MRSA and was treated with IV vancomycin. I discontinued vancomycin today and switch to oral doxycycline. Unfortunately the cultures were not sent from Er.  #Acute hypoxic respiratory failure likely in the setting of acute pulmonary edema/CHF. Currently on 4 L of oxygen. Try to wean off oxygen gradually.  #Hypokalemia likely in the setting of diuretics: Repleted IV and oral potassium chloride. Magnesium level acceptable.  Unable to check lab this morning because of poor access. PICC line was placed. We will send labs. Discussed with the patient's nurse.  #Chronic kidney disease, stage III: Reportedly patient required hemodialysis in the past. Currently her serum creatinine level is lower than baseline wondering if it is due to fluid overload. Monitor BMP and urine output.  #Diabetes: Patient had low blood sugar level yesterday because she was not eating well. Now she has good oral intake. We will continue current insulin regimen. Monitor blood sugar level.   #hypertension: Blood pressure acceptable continue Norvasc, metoprolol, diuretics  #Hyperlipidemia: Continue statin.  DVT prophylaxis: Heparin subcutaneous Code Status: Full code Family Communication: No family present at bedside. Disposition Plan: Likely discharge home in 1-2 days    Consultants:   None  Procedures: None Antimicrobials:  IV vancomycin 1/4-06/15/2016 Doxycycline 1/6-   Subjective: Patient was seen and examined at bedside. Reported feeling much better. He still shortness of breath especially with exertion. Denied headache, dizziness, chest pain, nausea vomiting. Objective: Vitals:   06/14/16 1424 06/14/16 2028 06/14/16 2103 06/15/16 0541  BP: (!) 151/58  129/70 (!) 152/73  Pulse: 78 74 80 81  Resp: 20 20 (!) 21 (!) 22  Temp: 98.5 F (36.9 C)  98.3 F (36.8 C) 98.4 F (36.9 C)  TempSrc: Oral  Oral Oral  SpO2: (!) 81% 94% 100% 100%  Weight:    123 kg (271 lb 3.2 oz)  Height:        Intake/Output Summary (Last 24 hours) at 06/15/16 1328 Last data filed at 06/15/16 1155  Gross per 24 hour  Intake  0 ml  Output             4100 ml  Net            -4100 ml   Filed Weights   06/13/16 1743 06/14/16 0613 06/15/16 0541  Weight: 113.4 kg (250 lb) 125.1 kg (275 lb 14.4 oz) 123 kg (271 lb 3.2 oz)    Examination:  General exam: Not in distress, lying in bed comfortable. Has anasarca Respiratory system:  Bibasal decreased breath sound, respiratory effort normal, no wheezing.  Cardiovascular system: S1 & S2 heard, RRR.  Bilateral lower extremity pitting edema, chronic in nature, unchanged Gastrointestinal system: Abdomen is distended and edematous abdominal wall. Nontender. Bowel sound positive Central nervous system: Alert and oriented. No focal neurological deficits. Extremities: Symmetric 5 x 5 power. Skin: No rashes, lesions or ulcers Psychiatry: Judgement and insight appear normal. Mood & affect appropriate.     Data Reviewed: I have personally reviewed following labs and imaging studies  CBC:  Recent Labs Lab 06/13/16 1851  WBC 6.2  NEUTROABS 4.5  HGB 10.9*  HCT 34.1*  MCV 96.3  PLT 285   Basic Metabolic Panel:  Recent Labs Lab 06/13/16 1851 06/14/16 0535  NA 141 142  K 3.8 2.6*  CL 104 105  CO2 29 29  GLUCOSE 213* 190*  BUN 16 14  CREATININE 1.26* 1.13*  CALCIUM 8.4* 8.1*  MG 2.0  --    GFR: Estimated Creatinine Clearance: 66.5 mL/min (by C-G formula based on SCr of 1.13 mg/dL (H)). Liver Function Tests:  Recent Labs Lab 06/13/16 1851  AST 34  ALT 18  ALKPHOS 149*  BILITOT 0.5  PROT 7.0  ALBUMIN 2.4*   No results for input(s): LIPASE, AMYLASE in the last 168 hours. No results for input(s): AMMONIA in the last 168 hours. Coagulation Profile: No results for input(s): INR, PROTIME in the last 168 hours. Cardiac Enzymes:  Recent Labs Lab 06/13/16 1851  TROPONINI <0.03   BNP (last 3 results) No results for input(s): PROBNP in the last 8760 hours. HbA1C: No results for input(s): HGBA1C in the last 72 hours. CBG:  Recent Labs Lab 06/14/16 2101 06/14/16 2210 06/14/16 2309 06/15/16 0818 06/15/16 1152  GLUCAP 48* 57* 102* 135* 206*   Lipid Profile: No results for input(s): CHOL, HDL, LDLCALC, TRIG, CHOLHDL, LDLDIRECT in the last 72 hours. Thyroid Function Tests: No results for input(s): TSH, T4TOTAL, FREET4, T3FREE, THYROIDAB in the last  72 hours. Anemia Panel: No results for input(s): VITAMINB12, FOLATE, FERRITIN, TIBC, IRON, RETICCTPCT in the last 72 hours. Sepsis Labs: No results for input(s): PROCALCITON, LATICACIDVEN in the last 168 hours.  Recent Results (from the past 240 hour(s))  MRSA PCR Screening     Status: None   Collection Time: 06/13/16 10:50 PM  Result Value Ref Range Status   MRSA by PCR NEGATIVE NEGATIVE Final    Comment:        The GeneXpert MRSA Assay (FDA approved for NASAL specimens only), is one component of a comprehensive MRSA colonization surveillance program. It is not intended to diagnose MRSA infection nor to guide or monitor treatment for MRSA infections.          Radiology Studies: Dg Chest 2 View  Result Date: 06/13/2016 CLINICAL DATA:  Shortness of breath and mild cough. EXAM: CHEST  2 VIEW COMPARISON:  March 11, 2016 FINDINGS: Layering effusions are identified. Focal opacity suspected in the left lung base and patchy opacity seen in the right mid  and lower lung. IMPRESSION: Findings worrisome for layering effusions and bilateral pulmonary infiltrates. Electronically Signed   By: Gerome Sam III M.D   On: 06/13/2016 18:52        Scheduled Meds: . amLODipine  5 mg Oral Daily  . aspirin EC  325 mg Oral Daily  . doxycycline  100 mg Oral Q12H  . FLUoxetine  20 mg Oral BID  . furosemide  60 mg Intravenous Q8H  . heparin  5,000 Units Subcutaneous Q8H  . insulin aspart  0-15 Units Subcutaneous TID WC  . insulin aspart  0-5 Units Subcutaneous QHS  . insulin aspart  3 Units Subcutaneous TID WC  . insulin glargine  7 Units Subcutaneous Daily  . iron polysaccharides  150 mg Oral BID  . metoprolol tartrate  25 mg Oral BID  . multivitamin with minerals  1 tablet Oral Daily  . pantoprazole  40 mg Oral Daily  . potassium chloride  40 mEq Oral BID  . pravastatin  40 mg Oral QHS  . QUEtiapine  25 mg Oral QHS  . sodium chloride flush  3 mL Intravenous Q12H   Continuous  Infusions:   LOS: 2 days    Azlynn Mitnick Jaynie Collins, MD [facility] Pager [phone]  If 7PM-7AM, please contact night-coverage [email or web address] Password [email or web address] 06/15/2016, 1:28 PM

## 2016-06-15 NOTE — Progress Notes (Signed)
Lab unable to draw blood due to limited venous access. Dr. Ronalee BeltsBhandari notified. New order for PICC line.

## 2016-06-15 NOTE — Progress Notes (Signed)
Notified MD, pt complaining of itching. She states she thinks she is allergic to the adhesive from the telemetry electrodes. MD ordered one time dose of Benadryl.

## 2016-06-16 LAB — GLUCOSE, CAPILLARY
GLUCOSE-CAPILLARY: 45 mg/dL — AB (ref 65–99)
GLUCOSE-CAPILLARY: 58 mg/dL — AB (ref 65–99)
GLUCOSE-CAPILLARY: 57 mg/dL — AB (ref 65–99)
GLUCOSE-CAPILLARY: 104 mg/dL — AB (ref 65–99)
Glucose-Capillary: 85 mg/dL (ref 65–99)
Glucose-Capillary: 153 mg/dL — ABNORMAL HIGH (ref 65–99)
Glucose-Capillary: 201 mg/dL — ABNORMAL HIGH (ref 65–99)

## 2016-06-16 LAB — BASIC METABOLIC PANEL
ANION GAP: 5 (ref 5–15)
BUN: 13 mg/dL (ref 6–20)
CHLORIDE: 104 mmol/L (ref 101–111)
CO2: 37 mmol/L — AB (ref 22–32)
CREATININE: 1.06 mg/dL — AB (ref 0.44–1.00)
Calcium: 8.2 mg/dL — ABNORMAL LOW (ref 8.9–10.3)
GFR calc non Af Amer: 55 mL/min — ABNORMAL LOW (ref >60)
Glucose, Bld: 56 mg/dL — ABNORMAL LOW (ref 65–99)
POTASSIUM: 3.1 mmol/L — AB (ref 3.5–5.1)
Sodium: 146 mmol/L — ABNORMAL HIGH (ref 135–145)

## 2016-06-16 LAB — MAGNESIUM: Magnesium: 1.7 mg/dL (ref 1.7–2.4)

## 2016-06-16 MED ORDER — MAGNESIUM SULFATE 2 GM/50ML IV SOLN
2.0000 g | Freq: Once | INTRAVENOUS | Status: AC
  Administered 2016-06-16: 2 g via INTRAVENOUS
  Filled 2016-06-16: qty 50

## 2016-06-16 MED ORDER — SODIUM CHLORIDE 0.9 % IV SOLN: 30.0000 meq | Freq: Once | INTRAVENOUS | Status: DC

## 2016-06-16 MED ORDER — POTASSIUM CHLORIDE 10 MEQ/100ML IV SOLN
10.0000 meq | INTRAVENOUS | Status: AC
Start: 2016-06-16 — End: 2016-06-16
  Administered 2016-06-16 (×3): 10 meq via INTRAVENOUS
  Filled 2016-06-16 (×3): qty 100

## 2016-06-16 MED ORDER — INSULIN GLARGINE 100 UNIT/ML ~~LOC~~ SOLN
4.0000 [IU] | Freq: Every day | SUBCUTANEOUS | Status: DC
  Filled 2016-06-16: qty 0.04

## 2016-06-16 MED ORDER — INSULIN ASPART 100 UNIT/ML ~~LOC~~ SOLN
0.0000 [IU] | Freq: Three times a day (TID) | SUBCUTANEOUS | Status: DC
  Administered 2016-06-17: 5 [IU] via SUBCUTANEOUS
  Administered 2016-06-17: 2 [IU] via SUBCUTANEOUS
  Administered 2016-06-17: 5 [IU] via SUBCUTANEOUS
  Administered 2016-06-18 (×2): 2 [IU] via SUBCUTANEOUS
  Administered 2016-06-18: 3 [IU] via SUBCUTANEOUS
  Administered 2016-06-19: 5 [IU] via SUBCUTANEOUS
  Administered 2016-06-19: 3 [IU] via SUBCUTANEOUS

## 2016-06-16 MED ORDER — ENOXAPARIN SODIUM 60 MG/0.6ML ~~LOC~~ SOLN
60.0000 mg | SUBCUTANEOUS | Status: DC
  Administered 2016-06-16 – 2016-06-18 (×3): 60 mg via SUBCUTANEOUS
  Filled 2016-06-16 (×4): qty 0.6

## 2016-06-16 MED ORDER — INSULIN ASPART 100 UNIT/ML ~~LOC~~ SOLN
2.0000 [IU] | Freq: Three times a day (TID) | SUBCUTANEOUS | Status: DC
  Administered 2016-06-17 – 2016-06-19 (×5): 2 [IU] via SUBCUTANEOUS

## 2016-06-16 NOTE — Progress Notes (Signed)
PROGRESS NOTE    [name]  ZOX:[id number] DOB: [birth year] DOA: 06/13/2016 PCP: [name], MD   Brief Narrative: 61 y.o. female with hx of DM, HTN, HLD, COPD, CKD3, recurrent cellulitis with MRSA, s/p IV vancomycin previously, presented to the ER with progressive SOB and orthopnea with leg swelling for the past week.She had hx of CKD in the past, and had been seeing Dr Adalberto Cole.She also has a small abscess on her right lateral leg, and it was I and D in the ER.  She has hx of MRSA bacteremia.  Assessment & Plan:   Principal Problem:   Volume overload Active Problems:   IDDM (insulin dependent diabetes mellitus) (HCC)   History of hypertension   HLD (hyperlipidemia)   CKD (chronic kidney disease), stage III   Paroxysmal SVT (supraventricular tachycardia) (HCC)   Abscess of skin and subcutaneous tissue   Acute congestive heart failure (HCC)  #Possible acute diastolic congestive heart failure: Patient presented with worsening dyspnea on exertion, anasarca, elevated BNP. -echocardiogram showed normal left ventricular systolic function with ejection fraction of 60-65%. Small pericardial effusion. -Patient has anasarca, responding well with IV Lasix. Patient is negative by almost 7 L. Follow-up labs. Monitor urine output. I will continue IV Lasix today. Patient reported clinically feeling better.  #Right lower extremity cellulitis: Status post I&D in the ER as per medical record. Patient has history of MRSA and was treated with IV vancomycin. Continue doxycycline. Unfortunately the cultures were not sent from Er.  #Acute hypoxic respiratory failure likely in the setting of acute pulmonary edema/CHF. Currently on 3 L of oxygen. Try to wean off oxygen gradually. Patient may also have sleep apnea. I advised patient to follow-up with her PCP and possibly pulmonologist for further evaluation.  #Hypokalemia likely in the setting of diuretics: Repleting potassium chloride IV and  orally. Also repleted magnesium.  #Chronic kidney disease, stage III: Reportedly patient required hemodialysis in the past. Currently her serum creatinine level is lower than baseline wondering if it is due to fluid overload. Continue Lasix and repeat lab in the morning.  #Diabetes with hypoglycemia: Patient with lower blood sugar level especially in the morning. Nighttime insulin discontinued. Sliding scale changed to thin/sensitive scale. Both pre-meal insulin and Lantus dose reduced. Continue to monitor blood sugar level.  #hypertension: Continue Norvasc, metoprolol, diuretics. Monitor blood pressure.  #Hyperlipidemia: Continue statin.  DVT prophylaxis: Lovenox subcutaneous Code Status: Full code Family Communication: No family present at bedside. Disposition Plan: Likely discharge home in 1-2 days    Consultants:   None  Procedures: None Antimicrobials:  IV vancomycin 1/4-06/15/2016 Doxycycline 1/6-   Subjective: Patient was seen and examined at bedside. Reported feeling better. Shortness of breath improving. Denied chest pain, nausea vomiting or abdominal pain. Objective: Vitals:   06/15/16 0541 06/15/16 1451 06/15/16 2120 06/16/16 0525  BP: (!) 152/73 (!) 169/90 140/70 (!) 156/76  Pulse: 81 80 82 80  Resp: (!) 22 20 20 20   Temp: 98.4 F (36.9 C) 98.4 F (36.9 C) 98.4 F (36.9 C) 98 F (36.7 C)  TempSrc: Oral Oral Oral Oral  SpO2: 100% 96% 99% 97%  Weight: 123 kg (271 lb 3.2 oz)   123 kg (271 lb 3.2 oz)  Height:        Intake/Output Summary (Last 24 hours) at 06/16/16 1149 Last data filed at 06/16/16 0526  Gross per 24 hour  Intake                0 ml  Output             5100 ml  Net            -5100 ml   Filed Weights   06/14/16 1610 06/15/16 0541 06/16/16 0525  Weight: 125.1 kg (275 lb 14.4 oz) 123 kg (271 lb 3.2 oz) 123 kg (271 lb 3.2 oz)    Examination:  General exam: Pleasant female sitting on bed, not in distress. Has anasarca. Respiratory system:  Clear bilaterally, respiratory effort normal. No wheezing.  Cardiovascular system: S1 & S2 heard, RRR.  Bilateral lower extremity pitting edema, chronic in nature, unchanged Gastrointestinal system: Abdomen is distended and edematous abdominal wall. Nontender. Bowel sound positive Central nervous system: Alert and oriented. No focal neurological deficits. Extremities: Symmetric 5 x 5 power. Skin: No rashes, lesions or ulcers Psychiatry: Judgement and insight appear normal. Mood & affect appropriate.     Data Reviewed: I have personally reviewed following labs and imaging studies  CBC:  Recent Labs Lab 06/13/16 1851  WBC 6.2  NEUTROABS 4.5  HGB 10.9*  HCT 34.1*  MCV 96.3  PLT 285   Basic Metabolic Panel:  Recent Labs Lab 06/13/16 1851 06/14/16 0535 06/15/16 1433 06/16/16 0619  NA 141 142 145 146*  K 3.8 2.6* 2.8* 3.1*  CL 104 105 105 104  CO2 29 29 34* 37*  GLUCOSE 213* 190* 174* 56*  BUN 16 14 12 13   CREATININE 1.26* 1.13* 1.14* 1.06*  CALCIUM 8.4* 8.1* 8.1* 8.2*  MG 2.0  --  1.6* 1.7   GFR: Estimated Creatinine Clearance: 70.9 mL/min (by C-G formula based on SCr of 1.06 mg/dL (H)). Liver Function Tests:  Recent Labs Lab 06/13/16 1851  AST 34  ALT 18  ALKPHOS 149*  BILITOT 0.5  PROT 7.0  ALBUMIN 2.4*   No results for input(s): LIPASE, AMYLASE in the last 168 hours. No results for input(s): AMMONIA in the last 168 hours. Coagulation Profile: No results for input(s): INR, PROTIME in the last 168 hours. Cardiac Enzymes:  Recent Labs Lab 06/13/16 1851  TROPONINI <0.03   BNP (last 3 results) No results for input(s): PROBNP in the last 8760 hours. HbA1C: No results for input(s): HGBA1C in the last 72 hours. CBG:  Recent Labs Lab 06/15/16 2124 06/16/16 0744 06/16/16 0801 06/16/16 0842 06/16/16 1123  GLUCAP 79 58* 45* 85 57*   Lipid Profile: No results for input(s): CHOL, HDL, LDLCALC, TRIG, CHOLHDL, LDLDIRECT in the last 72 hours. Thyroid  Function Tests: No results for input(s): TSH, T4TOTAL, FREET4, T3FREE, THYROIDAB in the last 72 hours. Anemia Panel: No results for input(s): VITAMINB12, FOLATE, FERRITIN, TIBC, IRON, RETICCTPCT in the last 72 hours. Sepsis Labs: No results for input(s): PROCALCITON, LATICACIDVEN in the last 168 hours.  Recent Results (from the past 240 hour(s))  MRSA PCR Screening     Status: None   Collection Time: 06/13/16 10:50 PM  Result Value Ref Range Status   MRSA by PCR NEGATIVE NEGATIVE Final    Comment:        The GeneXpert MRSA Assay (FDA approved for NASAL specimens only), is one component of a comprehensive MRSA colonization surveillance program. It is not intended to diagnose MRSA infection nor to guide or monitor treatment for MRSA infections.          Radiology Studies: No results found.      Scheduled Meds: . amLODipine  5 mg Oral Daily  . aspirin EC  325 mg Oral Daily  .  doxycycline  100 mg Oral BID  . FLUoxetine  20 mg Oral BID  . furosemide  60 mg Intravenous Q8H  . heparin  5,000 Units Subcutaneous Q8H  . insulin aspart  0-9 Units Subcutaneous TID WC  . insulin aspart  2 Units Subcutaneous TID WC  . [START ON 06/17/2016] insulin glargine  4 Units Subcutaneous Daily  . iron polysaccharides  150 mg Oral BID  . magnesium sulfate 1 - 4 g bolus IVPB  2 g Intravenous Once  . metoprolol tartrate  25 mg Oral BID  . multivitamin with minerals  1 tablet Oral Daily  . pantoprazole  40 mg Oral Daily  . potassium chloride (KCL MULTIRUN) 30 mEq in 265 mL IVPB  30 mEq Intravenous Once  . potassium chloride  40 mEq Oral BID  . pravastatin  40 mg Oral QHS  . QUEtiapine  25 mg Oral QHS  . sodium chloride flush  3 mL Intravenous Q12H   Continuous Infusions:   LOS: 3 days    Manasvi Dickard Jaynie CollinsPrasad Kaylon Hitz, MD [facility] Pager [phone]  If 7PM-7AM, please contact night-coverage [email or web address] Password [email or web address] 06/16/2016, 11:49 AM

## 2016-06-16 NOTE — Progress Notes (Signed)
Hypoglycemic Event  CBG: 57  Treatment: 15 GM carbohydrate snack  Symptoms: None  Follow-up CBG: Time: 1249 CBG Result: 104  Possible Reasons for Event: Unknown  Comments/MD notified: Dr. Suzi RootsBhandari     [name], [facility]

## 2016-06-16 NOTE — Progress Notes (Signed)
Hypoglycemic Event  CBG: 58  Treatment: 15 GM carbohydrate snack  Symptoms: None  Follow-up CBG: Time: 0842 CBG Result: 85  Possible Reasons for Event: Inadequate meal intake and Unknown  Comments/MD notified: Dr. Ronalee BeltsBhandari notified    [name], [facility]

## 2016-06-17 LAB — GLUCOSE, CAPILLARY
GLUCOSE-CAPILLARY: 184 mg/dL — AB (ref 65–99)
GLUCOSE-CAPILLARY: 187 mg/dL — AB (ref 65–99)
GLUCOSE-CAPILLARY: 205 mg/dL — AB (ref 65–99)
GLUCOSE-CAPILLARY: 184 mg/dL — AB (ref 65–99)
Glucose-Capillary: 263 mg/dL — ABNORMAL HIGH (ref 65–99)

## 2016-06-17 LAB — MAGNESIUM: MAGNESIUM: 1.7 mg/dL (ref 1.7–2.4)

## 2016-06-17 LAB — BASIC METABOLIC PANEL
ANION GAP: 7 (ref 5–15)
BUN: 13 mg/dL (ref 6–20)
CHLORIDE: 100 mmol/L — AB (ref 101–111)
CO2: 37 mmol/L — ABNORMAL HIGH (ref 22–32)
Calcium: 8.3 mg/dL — ABNORMAL LOW (ref 8.9–10.3)
Creatinine, Ser: 1.09 mg/dL — ABNORMAL HIGH (ref 0.44–1.00)
GFR calc Af Amer: >60 mL/min (ref >60)
GFR, EST NON AFRICAN AMERICAN: 54 mL/min — AB (ref >60)
Glucose, Bld: 207 mg/dL — ABNORMAL HIGH (ref 65–99)
POTASSIUM: 3.8 mmol/L (ref 3.5–5.1)
SODIUM: 144 mmol/L (ref 135–145)

## 2016-06-17 MED ORDER — ACETAZOLAMIDE SODIUM 500 MG IJ SOLR
250.0000 mg | Freq: Once | INTRAMUSCULAR | Status: AC
  Administered 2016-06-17: 250 mg via INTRAVENOUS
  Filled 2016-06-17: qty 500

## 2016-06-17 NOTE — Progress Notes (Signed)
PROGRESS NOTE    [name]  ZOX:[id number] DOB: May 12, 1956 DOA: 06/13/2016 PCP: [name], MD   Brief Narrative: 61 y.o. female with hx of DM, HTN, HLD, COPD, CKD3, recurrent cellulitis with MRSA, s/p IV vancomycin previously, presented to the ER with progressive SOB and orthopnea with leg swelling for the past week.She had hx of CKD in the past, and had been seeing Dr Adalberto Cole.She also has a small abscess on her right lateral leg, and it was I and D in the ER.  She has hx of MRSA bacteremia.  Assessment & Plan:   Principal Problem:   Volume overload Active Problems:   IDDM (insulin dependent diabetes mellitus) (HCC)   History of hypertension   HLD (hyperlipidemia)   CKD (chronic kidney disease), stage III   Paroxysmal SVT (supraventricular tachycardia) (HCC)   Abscess of skin and subcutaneous tissue   Acute congestive heart failure (HCC)  #Possible acute diastolic congestive heart failure: Patient presented with worsening dyspnea on exertion, anasarca, elevated BNP. -echocardiogram showed normal left ventricular systolic function with ejection fraction of 60-65%. Small pericardial effusion. -Patient has anasarca, responding well with IV Lasix. Patient is negative by almost 11.8 L. -Continue IV Lasix. Patient has increased CO2 because of diuretics. I will order a dose of acetazolamide. Monitor BMP.  #Right lower extremity cellulitis: Status post I&D in the ER as per medical record. Patient has history of MRSA and was treated with IV vancomycin. Continue doxycycline. Unfortunately the cultures were not sent from Er. -Wound care consulted.  #Acute hypoxic respiratory failure likely in the setting of acute pulmonary edema/CHF. Currently on 5 L of oxygen. Try to wean oxygen slowly to maintain oxygen saturation more than 92%. Patient may also have sleep apnea. I advised patient to follow-up with her PCP and possibly pulmonologist for further evaluation. -She may need to go  home with oxygen treatment. We will continue to monitor for now.  #Hypokalemia likely in the setting of diuretics: Repleting potassium chloride IV and orally.  -Replete 2 g of magnesium.  #Chronic kidney disease, stage III: Reportedly patient required hemodialysis in the past. Currently her serum creatinine level is lower than baseline wondering if it is due to fluid overload. Continue Lasix and repeat lab in the morning. -Serum creatinine is stable.  #Diabetes with hypoglycemia: Blood sugar is better now. Patient is currently on lower insulin regimen than at home. Continue to monitor.  #hypertension: Continue Norvasc, metoprolol, diuretics. Monitor blood pressure.  #Hyperlipidemia: Continue statin.  DVT prophylaxis: Lovenox subcutaneous Code Status: Full code Family Communication: No family present at bedside. Disposition Plan: Likely discharge home in 1-2 days    Consultants:   None  Procedures: None Antimicrobials:  IV vancomycin 1/4-06/15/2016 Doxycycline 1/6-   Subjective: Patient was seen and examined at bedside. Reported feeling better however requiring 5 L a box in today. Denied nausea, vomiting, chest pain or shortness of breath.  Objective: Vitals:   06/16/16 0525 06/16/16 1401 06/16/16 2230 06/17/16 0539  BP: (!) 156/76 (!) 146/75 (!) 161/84 (!) 145/79  Pulse: 80 79 83 83  Resp: 20 20 20 20   Temp: 98 F (36.7 C) 98.5 F (36.9 C) 98.4 F (36.9 C) 98.3 F (36.8 C)  TempSrc: Oral Oral Oral Oral  SpO2: 97% 97% 97% 96%  Weight: 123 kg (271 lb 3.2 oz)   118.1 kg (260 lb 6.4 oz)  Height:        Intake/Output Summary (Last 24 hours) at 06/17/16 1256 Last data filed at  06/17/16 0956  Gross per 24 hour  Intake              440 ml  Output             6050 ml  Net            -5610 ml   Filed Weights   06/15/16 0541 06/16/16 0525 06/17/16 0539  Weight: 123 kg (271 lb 3.2 oz) 123 kg (271 lb 3.2 oz) 118.1 kg (260 lb 6.4 oz)    Examination:  General exam:  Pleasant female sitting on bed, not in distress. Still has anasarca. Respiratory system: Clear bilaterally, respiratory effort normal. No wheezing.  Cardiovascular system: S1 & S2 heard, RRR.  Bilateral lower extremity pitting edema, chronic in nature, no significant changes. Gastrointestinal system: Abdomen is distended and edematous abdominal wall, minimal change. Nontender. Bowel sound positive Central nervous system: Alert and oriented. No focal neurological deficits. Extremities: Symmetric 5 x 5 power. Right lower extremity has dressing on at the site owned. Skin: No rashes, lesions or ulcers Psychiatry: Judgement and insight appear normal. Mood & affect appropriate.     Data Reviewed: I have personally reviewed following labs and imaging studies  CBC:  Recent Labs Lab 06/13/16 1851  WBC 6.2  NEUTROABS 4.5  HGB 10.9*  HCT 34.1*  MCV 96.3  PLT 285   Basic Metabolic Panel:  Recent Labs Lab 06/13/16 1851 06/14/16 0535 06/15/16 1433 06/16/16 0619 06/17/16 0521  NA 141 142 145 146* 144  K 3.8 2.6* 2.8* 3.1* 3.8  CL 104 105 105 104 100*  CO2 29 29 34* 37* 37*  GLUCOSE 213* 190* 174* 56* 207*  BUN 16 14 12 13 13   CREATININE 1.26* 1.13* 1.14* 1.06* 1.09*  CALCIUM 8.4* 8.1* 8.1* 8.2* 8.3*  MG 2.0  --  1.6* 1.7 1.7   GFR: Estimated Creatinine Clearance: 67.3 mL/min (by C-G formula based on SCr of 1.09 mg/dL (H)). Liver Function Tests:  Recent Labs Lab 06/13/16 1851  AST 34  ALT 18  ALKPHOS 149*  BILITOT 0.5  PROT 7.0  ALBUMIN 2.4*   No results for input(s): LIPASE, AMYLASE in the last 168 hours. No results for input(s): AMMONIA in the last 168 hours. Coagulation Profile: No results for input(s): INR, PROTIME in the last 168 hours. Cardiac Enzymes:  Recent Labs Lab 06/13/16 1851  TROPONINI <0.03   BNP (last 3 results) No results for input(s): PROBNP in the last 8760 hours. HbA1C: No results for input(s): HGBA1C in the last 72 hours. CBG:  Recent  Labs Lab 06/16/16 1615 06/16/16 2050 06/17/16 0743 06/17/16 0948 06/17/16 1156  GLUCAP 153* 201* 184* 187* 205*   Lipid Profile: No results for input(s): CHOL, HDL, LDLCALC, TRIG, CHOLHDL, LDLDIRECT in the last 72 hours. Thyroid Function Tests: No results for input(s): TSH, T4TOTAL, FREET4, T3FREE, THYROIDAB in the last 72 hours. Anemia Panel: No results for input(s): VITAMINB12, FOLATE, FERRITIN, TIBC, IRON, RETICCTPCT in the last 72 hours. Sepsis Labs: No results for input(s): PROCALCITON, LATICACIDVEN in the last 168 hours.  Recent Results (from the past 240 hour(s))  MRSA PCR Screening     Status: None   Collection Time: 06/13/16 10:50 PM  Result Value Ref Range Status   MRSA by PCR NEGATIVE NEGATIVE Final    Comment:        The GeneXpert MRSA Assay (FDA approved for NASAL specimens only), is one component of a comprehensive MRSA colonization surveillance program. It is not intended to  diagnose MRSA infection nor to guide or monitor treatment for MRSA infections.          Radiology Studies: No results found.      Scheduled Meds: . acetaZOLAMIDE  250 mg Intravenous Once  . amLODipine  5 mg Oral Daily  . aspirin EC  325 mg Oral Daily  . doxycycline  100 mg Oral BID  . enoxaparin (LOVENOX) injection  60 mg Subcutaneous Q24H  . FLUoxetine  20 mg Oral BID  . furosemide  60 mg Intravenous Q8H  . insulin aspart  0-9 Units Subcutaneous TID WC  . insulin aspart  2 Units Subcutaneous TID WC  . iron polysaccharides  150 mg Oral BID  . metoprolol tartrate  25 mg Oral BID  . multivitamin with minerals  1 tablet Oral Daily  . pantoprazole  40 mg Oral Daily  . potassium chloride  40 mEq Oral BID  . pravastatin  40 mg Oral QHS  . QUEtiapine  25 mg Oral QHS  . sodium chloride flush  3 mL Intravenous Q12H   Continuous Infusions:   LOS: 4 days    Darilyn Storbeck Jaynie Collins, MD [facility] Pager [phone]  If 7PM-7AM, please contact  night-coverage [email or web address] Password [email or web address] 06/17/2016, 12:56 PM

## 2016-06-17 NOTE — Consult Note (Signed)
WOC Nurse wound consult note Reason for Consult:Patient is seen by surgery weekly or bi-weekly due to recurrent "boils" on legs and back.  Cellulitis to right lateral lower leg.  I & D to abscess on right lower leg.  Wound type: Infectious Pressure Injury POA: N/A Measurement:Right lateral lower leg:  I & D site (in ED)   2 boils, 0.5 cm in diameter to back.  Was applying NS moist dressings daily.  Wound FAO:[id number]:Purulence to right lateral leg.   Drainage (amount, consistency, odor) Minimal serosanguinous drainage.  No odor.  Periwound:Erythema and edema to right lower leg Dressing procedure/placement/frequency:Cleanse right lower leg wound ( I & D site) with NS.  Apply Iodoform packing strip to open wound.  Cover with 4x4 gauze, kerlix and tape.  Change daily.   Clean boils to back with NS and pat gently dry.  Apply silicone border foam dressing.  Change every other day.    Will not follow at this time.  Please re-consult if needed.  Maple HudsonKaren Shanik [place] RN BSN [facility] Pager [phone]

## 2016-06-18 LAB — BASIC METABOLIC PANEL
ANION GAP: 4 — AB (ref 5–15)
BUN: 13 mg/dL (ref 6–20)
CO2: 40 mmol/L — AB (ref 22–32)
Calcium: 8.5 mg/dL — ABNORMAL LOW (ref 8.9–10.3)
Chloride: 96 mmol/L — ABNORMAL LOW (ref 101–111)
Creatinine, Ser: 1.11 mg/dL — ABNORMAL HIGH (ref 0.44–1.00)
GFR calc Af Amer: >60 mL/min (ref >60)
GFR calc non Af Amer: 52 mL/min — ABNORMAL LOW (ref >60)
GLUCOSE: 217 mg/dL — AB (ref 65–99)
POTASSIUM: 3.9 mmol/L (ref 3.5–5.1)
Sodium: 140 mmol/L (ref 135–145)

## 2016-06-18 LAB — GLUCOSE, CAPILLARY
Glucose-Capillary: 200 mg/dL — ABNORMAL HIGH (ref 65–99)
Glucose-Capillary: 187 mg/dL — ABNORMAL HIGH (ref 65–99)
Glucose-Capillary: 206 mg/dL — ABNORMAL HIGH (ref 65–99)
Glucose-Capillary: 276 mg/dL — ABNORMAL HIGH (ref 65–99)

## 2016-06-18 LAB — MAGNESIUM: Magnesium: 1.6 mg/dL — ABNORMAL LOW (ref 1.7–2.4)

## 2016-06-18 MED ORDER — FUROSEMIDE 10 MG/ML IJ SOLN
40.0000 mg | Freq: Two times a day (BID) | INTRAMUSCULAR | Status: DC
  Administered 2016-06-19: 40 mg via INTRAVENOUS
  Filled 2016-06-18: qty 4

## 2016-06-18 MED ORDER — ACETAZOLAMIDE SODIUM 500 MG IJ SOLR
250.0000 mg | Freq: Once | INTRAMUSCULAR | Status: AC
Start: 2016-06-18 — End: 2016-06-18
  Administered 2016-06-18: 250 mg via INTRAVENOUS
  Filled 2016-06-18: qty 500

## 2016-06-18 MED ORDER — MAGNESIUM SULFATE 2 GM/50ML IV SOLN
2.0000 g | Freq: Once | INTRAVENOUS | Status: AC
  Administered 2016-06-18: 2 g via INTRAVENOUS
  Filled 2016-06-18 (×2): qty 50

## 2016-06-18 MED ORDER — ACETAZOLAMIDE SODIUM 500 MG IJ SOLR
250.0000 mg | Freq: Once | INTRAMUSCULAR | Status: DC
  Filled 2016-06-18: qty 500

## 2016-06-18 NOTE — Progress Notes (Signed)
PROGRESS NOTE    [name]  ZOX:[id number] DOB: January 26, 1956 DOA: 06/13/2016 PCP: [name], MD   Brief Narrative: 61 y.o. female with hx of DM, HTN, HLD, COPD, CKD3, recurrent cellulitis with MRSA, s/p IV vancomycin previously, presented to the ER with progressive SOB and orthopnea with leg swelling for the past week.She had hx of CKD in the past, and had been seeing Dr Adalberto Cole.She also has a small abscess on her right lateral leg, and it was I and D in the ER.  She has hx of MRSA bacteremia.  Assessment & Plan:   Principal Problem:   Volume overload Active Problems:   IDDM (insulin dependent diabetes mellitus) (HCC)   History of hypertension   HLD (hyperlipidemia)   CKD (chronic kidney disease), stage III   Paroxysmal SVT (supraventricular tachycardia) (HCC)   Abscess of skin and subcutaneous tissue   Acute congestive heart failure (HCC)  #Possible acute diastolic congestive heart failure: Patient presented with worsening dyspnea on exertion, anasarca, elevated BNP. -echocardiogram showed normal left ventricular systolic function with ejection fraction of 60-65%. Small pericardial effusion. -Patient is responding very well with IV Lasix with negative of almost 20 L. She has elevated CO2 level therefore treated with a dose of Diamox yesterday. I will reduce Lasix to 40 mg twice a day from 60 mg every 8. I will order another dose of Diamox today. Her anasarca is improving. I reinforced her about low-salt diet and compliance with Lasix at home. Need to monitor BMP especially CO2 level.  #Right lower extremity cellulitis: Status post I&D in the ER as per medical record. Patient has history of MRSA and was treated with IV vancomycin. Continue doxycycline. Unfortunately the cultures were not sent from Er. -Wound care consulted.  #Acute hypoxic respiratory failure likely in the setting of acute pulmonary edema/CHF. Patient is still on high flow oxygen requiring 5 L of oxygen.  I discussed with the respiratory therapist as well as patient's nurse to wean down oxygen gradually. The goal will be to maintain oxygen around 91%. Patient may also have sleep apnea and likely needs oxygen when she goes home. I'm hoping we can wean down to 2-3 L of oxygen and can discharge home safely. I recommended her to follow-up with pulmonologist for sleepy study.  #Hypokalemia likely in the setting of diuretics: Replating potassium chloride IV and orally. Serum potassium level III.9 today. Magnesium is borderline low therefore will replete 2 g of magnesium sulfate.  #Chronic kidney disease, stage III: Reportedly patient required hemodialysis in the past. Currently her serum creatinine level is lower than baseline wondering if it is due to fluid overload. Patient's serum creatinine level is actually stable and improving with Lasix. Monitor BMP.  #Diabetes with hypoglycemia: Blood sugar is better now. Patient is currently on lower insulin regimen than at home. Continue to monitor.  #hypertension: Continue Norvasc, metoprolol, diuretics. Monitor blood pressure.  #Hyperlipidemia: Continue statin.  DVT prophylaxis: Lovenox subcutaneous Code Status: Full code Family Communication: No family present at bedside. Disposition Plan: Likely discharge home in 1-2 days    Consultants:   None  Procedures: None Antimicrobials:  IV vancomycin 1/4-06/15/2016 Doxycycline 1/6-   Subjective: Patient was seen and examined at bedside. Patient reported feeling better but is still requiring about 5 L of oxygen. Reports shortness of breath is better. Denied headache, dizziness, chest pain, nausea or vomiting. Objective: Vitals:   06/18/16 0555 06/18/16 0927 06/18/16 1034 06/18/16 1346  BP:    (!) 142/68  Pulse:  86  Resp:    18  Temp:    98.9 F (37.2 C)  TempSrc:    Oral  SpO2:  97% 95% 96%  Weight: 117.9 kg (259 lb 15.1 oz)     Height:        Intake/Output Summary (Last 24 hours) at  06/18/16 1545 Last data filed at 06/18/16 1300  Gross per 24 hour  Intake              360 ml  Output             7150 ml  Net            -6790 ml   Filed Weights   06/16/16 0525 06/17/16 0539 06/18/16 0555  Weight: 123 kg (271 lb 3.2 oz) 118.1 kg (260 lb 6.4 oz) 117.9 kg (259 lb 15.1 oz)    Examination:  General exam: Pleasant female sitting on bed, not in distress. Anasarca is minimally improving Respiratory system: Clear bilateral, respiratory effort normal. No wheezing.  Cardiovascular system: S1 & S2 heard, RRR.  Bilateral lower extremity pitting edema, chronic in nature, no significant changes. Gastrointestinal system: Abdomen is distended and edematous abdominal wall, somewhat improvement. Nontender. Bowel sound positive Central nervous system: Alert and oriented. No focal neurological deficits. Extremities: Symmetric 5 x 5 power. Right lower extremity has dressing on at the site owned. Skin: No rashes, lesions or ulcers Psychiatry: Judgement and insight appear normal. Mood & affect appropriate.     Data Reviewed: I have personally reviewed following labs and imaging studies  CBC:  Recent Labs Lab 06/13/16 1851  WBC 6.2  NEUTROABS 4.5  HGB 10.9*  HCT 34.1*  MCV 96.3  PLT 285   Basic Metabolic Panel:  Recent Labs Lab 06/13/16 1851 06/14/16 0535 06/15/16 1433 06/16/16 0619 06/17/16 0521 06/18/16 0657  NA 141 142 145 146* 144 140  K 3.8 2.6* 2.8* 3.1* 3.8 3.9  CL 104 105 105 104 100* 96*  CO2 29 29 34* 37* 37* 40*  GLUCOSE 213* 190* 174* 56* 207* 217*  BUN 16 14 12 13 13 13   CREATININE 1.26* 1.13* 1.14* 1.06* 1.09* 1.11*  CALCIUM 8.4* 8.1* 8.1* 8.2* 8.3* 8.5*  MG 2.0  --  1.6* 1.7 1.7 1.6*   GFR: Estimated Creatinine Clearance: 66 mL/min (by C-G formula based on SCr of 1.11 mg/dL (H)). Liver Function Tests:  Recent Labs Lab 06/13/16 1851  AST 34  ALT 18  ALKPHOS 149*  BILITOT 0.5  PROT 7.0  ALBUMIN 2.4*   No results for input(s): LIPASE,  AMYLASE in the last 168 hours. No results for input(s): AMMONIA in the last 168 hours. Coagulation Profile: No results for input(s): INR, PROTIME in the last 168 hours. Cardiac Enzymes:  Recent Labs Lab 06/13/16 1851  TROPONINI <0.03   BNP (last 3 results) No results for input(s): PROBNP in the last 8760 hours. HbA1C: No results for input(s): HGBA1C in the last 72 hours. CBG:  Recent Labs Lab 06/17/16 1156 06/17/16 1633 06/17/16 2122 06/18/16 0712 06/18/16 1112  GLUCAP 205* 263* 184* 200* 187*   Lipid Profile: No results for input(s): CHOL, HDL, LDLCALC, TRIG, CHOLHDL, LDLDIRECT in the last 72 hours. Thyroid Function Tests: No results for input(s): TSH, T4TOTAL, FREET4, T3FREE, THYROIDAB in the last 72 hours. Anemia Panel: No results for input(s): VITAMINB12, FOLATE, FERRITIN, TIBC, IRON, RETICCTPCT in the last 72 hours. Sepsis Labs: No results for input(s): PROCALCITON, LATICACIDVEN in the last 168 hours.  Recent Results (  from the past 240 hour(s))  MRSA PCR Screening     Status: None   Collection Time: 06/13/16 10:50 PM  Result Value Ref Range Status   MRSA by PCR NEGATIVE NEGATIVE Final    Comment:        The GeneXpert MRSA Assay (FDA approved for NASAL specimens only), is one component of a comprehensive MRSA colonization surveillance program. It is not intended to diagnose MRSA infection nor to guide or monitor treatment for MRSA infections.          Radiology Studies: No results found.      Scheduled Meds: . acetaZOLAMIDE  250 mg Intravenous Once  . amLODipine  5 mg Oral Daily  . aspirin EC  325 mg Oral Daily  . doxycycline  100 mg Oral BID  . enoxaparin (LOVENOX) injection  60 mg Subcutaneous Q24H  . FLUoxetine  20 mg Oral BID  . furosemide  40 mg Intravenous BID  . insulin aspart  0-9 Units Subcutaneous TID WC  . insulin aspart  2 Units Subcutaneous TID WC  . iron polysaccharides  150 mg Oral BID  . metoprolol tartrate  25 mg Oral BID   . multivitamin with minerals  1 tablet Oral Daily  . pantoprazole  40 mg Oral Daily  . potassium chloride  40 mEq Oral BID  . pravastatin  40 mg Oral QHS  . QUEtiapine  25 mg Oral QHS  . sodium chloride flush  3 mL Intravenous Q12H   Continuous Infusions:   LOS: 5 days    Dron Jaynie Collins, MD [facility] Pager [phone]  If 7PM-7AM, please contact night-coverage [email or web address] Password [email or web address] 06/18/2016, 3:45 PM

## 2016-06-19 DIAGNOSIS — I5031 Acute diastolic (congestive) heart failure: Secondary | ICD-10-CM

## 2016-06-19 LAB — BASIC METABOLIC PANEL
Anion gap: 6 (ref 5–15)
BUN: 14 mg/dL (ref 6–20)
CHLORIDE: 95 mmol/L — AB (ref 101–111)
CO2: 38 mmol/L — AB (ref 22–32)
CREATININE: 1.13 mg/dL — AB (ref 0.44–1.00)
Calcium: 8.7 mg/dL — ABNORMAL LOW (ref 8.9–10.3)
GFR calc non Af Amer: 51 mL/min — ABNORMAL LOW (ref >60)
GFR, EST AFRICAN AMERICAN: 60 mL/min — AB (ref >60)
GLUCOSE: 232 mg/dL — AB (ref 65–99)
Potassium: 4.3 mmol/L (ref 3.5–5.1)
Sodium: 139 mmol/L (ref 135–145)

## 2016-06-19 LAB — GLUCOSE, CAPILLARY
Glucose-Capillary: 213 mg/dL — ABNORMAL HIGH (ref 65–99)
Glucose-Capillary: 280 mg/dL — ABNORMAL HIGH (ref 65–99)

## 2016-06-19 LAB — MAGNESIUM: Magnesium: 1.9 mg/dL (ref 1.7–2.4)

## 2016-06-19 MED ORDER — DOXYCYCLINE HYCLATE 100 MG PO TABS: 100.0000 mg | ORAL_TABLET | Freq: Two times a day (BID) | ORAL | 0 refills | Status: AC

## 2016-06-19 MED ORDER — HYDRALAZINE HCL 50 MG PO TABS: 50.0000 mg | ORAL_TABLET | Freq: Three times a day (TID) | ORAL | 0 refills | Status: AC

## 2016-06-19 MED ORDER — TORSEMIDE 20 MG PO TABS: 20.0000 mg | ORAL_TABLET | Freq: Two times a day (BID) | ORAL | 0 refills | Status: AC

## 2016-06-19 NOTE — Care Management Note (Signed)
Case Management Note  Patient Details  Name: Barrington EllisonGenevieve A Scicchitano MRN: [id number] Date of Birth: 1955/11/10    Expected Discharge Date:       06/19/2016           Expected Discharge Plan:  Home w Home Health Services  In-House Referral:  NA  Discharge planning Services  CM Consult  Post Acute Care Choice:  Home Health Choice offered to:  Patient  DME Arranged:  Oxygen DME Agency:  [facility]  HH Arranged:  [facility]  [facility]  Status of Service:  Completed, signed off  If discussed at Long Length of Stay Meetings, dates discussed:    Additional Comments: Patient offered choices of HH agencies. HH ordered by attending. Patient will discharge home today with oxygen and HH [facility]. Patient aware [facility] has 48 hours to initiate services.  Will Heinkel, Chrystine OilerSharley Diane, RN 06/19/2016, 11:53 AM

## 2016-06-19 NOTE — Progress Notes (Signed)
Late entry- MD made aware of CBG 276, no orders received, will continue to monitor.

## 2016-06-19 NOTE — Discharge Summary (Signed)
Sophia RICKETT VWU:[id number] DOB: 05/07/1956 DOA: 06/13/2016  PCP: [name], MD  Admit date: 06/13/2016  Discharge date: 06/19/2016  Admitted From: Home  Disposition:  Home   Recommendations for Outpatient Follow-up:   Follow up with PCP in 1-2 weeks  PCP Please obtain BMP/CBC, 2 view CXR in 1week,  (see Discharge instructions)   PCP Please follow up on the following pending results: Monitor BMP at diuretic dose   Home Health: RN, PT Equipment/Devices:  Home o2 if qulaifies Discharge Condition: Stable   CODE STATUS: Full Diet Recommendation: Diet heart healthy/carb modified with 1.5 L daily fluid restriction   Chief Complaint  Patient presents with  . Shortness of Breath     Brief history of present illness from the day of admission and additional interim summary    61 y.o.female with hx of DM, HTN, HLD, COPD, CKD3, recurrent cellulitis with MRSA, s/p IV vancomycin previously, presented to the ER with progressive SOB and orthopnea with leg swelling for the past week.She had hx of CKD in the past, and had been seeing Dr Adalberto Cole.She also has a small abscess on her right lateral leg, and it was I and D in the ER. She has hx of MRSA bacteremia.  Hospital issues addressed     1. Acute hypoxic respiratory failure due to acute on chronic diastolic CHF EF 60%. She was treated with IV Lasix and diuresed well, she is negative close to 23 L but I think the intake and output is not currently charted, her shortness of breath has almost completely resolved, she initially required high flow oxygen but she is now down to 2 L nasal cannula oxygen and symptom free. We will evaluate her for home oxygen if she qualifies she'll get it, at this point she will be placed on scheduled diuretics at home, she has been  counseled on fluid and salt restriction and she will be discharged home on that. Home PT RN will also be ordered. It was PCP to monitor BMP in diuretic dose closely.  2. Recurrent cellulitis and abscess, now right lower extremity. She had incision and drainage done in the ER on her right lower extremity mid leg, no surrounding colitis, was treated with IV vancomycin now switched to doxycycline for 5 more days. Wound care at home to be continued by home RN.  3. Dyslipidemia. On statin continue.  4. CKD 3. Baseline creatinine, monitor on diuretics by PCP.  5. Hypertension. Discontinue Norvasc due to fluid retention and lower extremity edema, continue beta blocker, diuretics, have added hydralazine.  6. DM type II. Continue home regimen unchanged. Outpatient follow-up with PCP for glycemic control.    Discharge diagnosis     Principal Problem:   Volume overload Active Problems:   IDDM (insulin dependent diabetes mellitus) (HCC)   History of hypertension   HLD (hyperlipidemia)   CKD (chronic kidney disease), stage III   Paroxysmal SVT (supraventricular tachycardia) (HCC)   Abscess of skin and subcutaneous tissue   Acute congestive heart failure (HCC)  Discharge instructions    Discharge Instructions    Discharge instructions    Complete by:  As directed    Follow with Primary MD TAPPER,[name], MD in 7 days   Get CBC, CMP, 2 view Chest X ray checked  by Primary MD in 5-7 days ( we routinely change or add medications that can affect your baseline labs and fluid status, therefore we recommend that you get the mentioned basic workup next visit with your PCP, your PCP may decide not to get them or add new tests based on their clinical decision)   Activity: As tolerated with Full fall precautions use walker/cane & assistance as needed   Disposition Home     Diet:   Diet heart healthy/carb modified  Check your Weight same time everyday, if you gain over 2 pounds, or you develop  in leg swelling, experience more shortness of breath or chest pain, call your Primary MD immediately. Follow Cardiac Low Salt Diet and 1.5 lit/day fluid restriction.   On your next visit with your primary care physician please Get Medicines reviewed and adjusted.   Please request your Prim.MD to go over all Hospital Tests and Procedure/Radiological results at the follow up, please get all Hospital records sent to your Prim MD by signing hospital release before you go home.   If you experience worsening of your admission symptoms, develop shortness of breath, life threatening emergency, suicidal or homicidal thoughts you must seek medical attention immediately by calling 911 or calling your MD immediately  if symptoms less severe.  You Must read complete instructions/literature along with all the possible adverse reactions/side effects for all the Medicines you take and that have been prescribed to you. Take any new Medicines after you have completely understood and accpet all the possible adverse reactions/side effects.   Do not drive, operate heavy machinery, perform activities at heights, swimming or participation in water activities or provide baby sitting services if your were admitted for syncope or siezures until you have seen by Primary MD or a Neurologist and advised to do so again.  Do not drive when taking Pain medications.    Do not take more than prescribed Pain, Sleep and Anxiety Medications  Special Instructions: If you have smoked or chewed Tobacco  in the last 2 yrs please stop smoking, stop any regular Alcohol  and or any Recreational drug use.  Wear Seat belts while driving.   Please note  You were cared for by a hospitalist during your hospital stay. If you have any questions about your discharge medications or the care you received while you were in the hospital after you are discharged, you can call the unit and asked to speak with the hospitalist on call if the  hospitalist that took care of you is not available. Once you are discharged, your primary care physician will handle any further medical issues. Please note that NO REFILLS for any discharge medications will be authorized once you are discharged, as it is imperative that you return to your primary care physician (or establish a relationship with a primary care physician if you do not have one) for your aftercare needs so that they can reassess your need for medications and monitor your lab values.   Increase activity slowly    Complete by:  As directed       Discharge Medications   Allergies as of 06/19/2016      Reactions   Other    Per pt some type of  mycin "some antibiotic sent me into renal failure."   Penicillins Rash   Tolerated Zosyn in 07/2015 Has patient had a PCN reaction causing immediate rash, facial/tongue/throat swelling, SOB or lightheadedness with hypotension: No Has patient had a PCN reaction causing severe rash involving mucus membranes or skin necrosis: No Has patient had a PCN reaction that required hospitalization No Has patient had a PCN reaction occurring within the last 10 years: No If all of the above answers are "NO", then may proceed with Cephalosporin use.      Medication List    STOP taking these medications   amLODipine 5 MG tablet Commonly known as:  NORVASC   Vancomycin 750-5 MG/150ML-% Soln Commonly known as:  VANCOCIN     TAKE these medications   aspirin EC 325 MG tablet Take 325 mg by mouth daily.   collagenase ointment Commonly known as:  SANTYL Apply in a nickel thickness to left achilles wound in the wound bed only, being careful not to get on surrounding skin.  Cover with dry gauze.   doxycycline 100 MG tablet Commonly known as:  VIBRA-TABS Take 1 tablet (100 mg total) by mouth 2 (two) times daily.   FLUoxetine 20 MG capsule Commonly known as:  PROZAC Take 20 mg by mouth 2 (two) times daily.   hydrALAZINE 50 MG tablet Commonly known  as:  APRESOLINE Take 1 tablet (50 mg total) by mouth 3 (three) times daily.   HYDROmorphone 4 MG tablet Commonly known as:  DILAUDID Take 4 mg by mouth 2 (two) times daily as needed. For pain   insulin glargine 100 UNIT/ML injection Commonly known as:  LANTUS Inject 0.07 mLs (7 Units total) into the skin daily.   iron polysaccharides 150 MG capsule Commonly known as:  NIFEREX Take 1 capsule (150 mg total) by mouth 2 (two) times daily.   metoprolol tartrate 25 MG tablet Commonly known as:  LOPRESSOR Take 1 tablet (25 mg total) by mouth 2 (two) times daily.   multivitamin with minerals Tabs tablet Take 1 tablet by mouth daily.   mupirocin ointment 2 % Commonly known as:  BACTROBAN Apply topically 3 (three) times daily.   NOVOLOG FLEXPEN 100 UNIT/ML FlexPen Generic drug:  insulin aspart Inject 3 Units into the skin 3 (three) times daily with meals.   pantoprazole 40 MG tablet Commonly known as:  PROTONIX Take 1 tablet (40 mg total) by mouth daily.   pravastatin 40 MG tablet Commonly known as:  PRAVACHOL Take 40 mg by mouth at bedtime.   PROAIR HFA 108 (90 Base) MCG/ACT inhaler Generic drug:  albuterol Inhale 2 puffs into the lungs every 6 (six) hours as needed for wheezing or shortness of breath.   QUEtiapine 25 MG tablet Commonly known as:  SEROQUEL Take 1 tablet by mouth at bedtime.   torsemide 20 MG tablet Commonly known as:  DEMADEX Take 1 tablet (20 mg total) by mouth 2 (two) times daily. What changed:  when to take this  reasons to take this       Follow-up Information    TAPPER,[name], MD. Schedule an appointment as soon as possible for a visit in 1 week(s).   Specialty:  Family Medicine Contact information: [address] [place] [id number] [phone]        Prentice DockerSuresh Koneswaran, MD. Schedule an appointment as soon as possible for a visit in 2 week(s).   Specialty:  Cardiology Why:  CHF Contact information: [address]  [place] [address] [phone]           Major procedures and Radiology Reports - PLEASE review detailed and final reports thoroughly  -     TTE  Left ventricle: The cavity size was normal. Wall thickness was increased in a pattern of moderate LVH. Systolic function was normal. The estimated ejection fraction was in the range of 60% to 65%. Wall motion was normal; there were no regional wall motion abnormalities. Doppler parameters are consistent with abnormal left ventricular relaxation (grade 1 diastolic  dysfunction). - Aortic valve: Mildly calcified annulus. Trileaflet; mildly thickened leaflets. Valve area (VTI): 2.7 cm^2. Valve area (Vmax): 2.48 cm^2. - Mitral valve: Mildly calcified annulus. Mildly thickened leaflets - Right ventricle: The cavity size was moderately dilated. - Right atrium: The atrium was moderately dilated. - Pulmonary arteries: Systolic pressure was moderately increased.  PA peak pressure: 42 mm Hg (S). - Pericardium, extracardiac: There is a moderate left pleural effusion. There is a small circumferential pericardial effusion. - Technically difficult study.    Dg Chest 2 View  Result Date: 06/13/2016 CLINICAL DATA:  Shortness of breath and mild cough. EXAM: CHEST  2 VIEW COMPARISON:  March 11, 2016 FINDINGS: Layering effusions are identified. Focal opacity suspected in the left lung base and patchy opacity seen in the right mid and lower lung. IMPRESSION: Findings worrisome for layering effusions and bilateral pulmonary infiltrates. Electronically Signed   By: Gerome Sam III M.D   On: 06/13/2016 18:52    Micro Results     Recent Results (from the past 240 hour(s))  MRSA PCR Screening     Status: None   Collection Time: 06/13/16 10:50 PM  Result Value Ref Range Status   MRSA by PCR NEGATIVE NEGATIVE Final    Comment:        The GeneXpert MRSA Assay (FDA approved for NASAL specimens only), is one component of a comprehensive MRSA  colonization surveillance program. It is not intended to diagnose MRSA infection nor to guide or monitor treatment for MRSA infections.     Today   Subjective    [name] today has no headache,no chest abdominal pain,no new weakness tingling or numbness, feels much better wants to go home today.     Objective   Blood pressure (!) 166/88, pulse 82, temperature 98.3 F (36.8 C), temperature source Oral, resp. rate 16, height 5\' 3"  (1.6 m), weight 107.6 kg (237 lb 3.2 oz), SpO2 99 %.   Intake/Output Summary (Last 24 hours) at 06/19/16 1059 Last data filed at 06/19/16 0500  Gross per 24 hour  Intake              123 ml  Output             2400 ml  Net            -2277 ml    Exam Awake Alert, Oriented x 3, No new F.N deficits, Normal affect [place].AT,PERRAL Supple Neck,No JVD, No cervical lymphadenopathy appriciated.  Symmetrical Chest wall movement, Good air movement bilaterally, few rales RRR,No Gallops,Rubs or new Murmurs, No Parasternal Heave +ve B.Sounds, Abd Soft, Non tender, No organomegaly appriciated, No rebound -guarding or rigidity. No Cyanosis, Clubbing or edema, No new Rash or bruise, bandage in the right middle leg, no surrounding cellulitis, no surrounding warmth.   Data Review   CBC w Diff: Lab Results  Component Value Date   WBC 6.2 06/13/2016   HGB 10.9 (L) 06/13/2016   HCT  34.1 (L) 06/13/2016   PLT 285 06/13/2016   LYMPHOPCT 19 06/13/2016   MONOPCT 8 06/13/2016   EOSPCT 1 06/13/2016   BASOPCT 1 06/13/2016    CMP: Lab Results  Component Value Date   NA 139 06/19/2016   K 4.3 06/19/2016   CL 95 (L) 06/19/2016   CO2 38 (H) 06/19/2016   BUN 14 06/19/2016   CREATININE 1.13 (H) 06/19/2016   PROT 7.0 06/13/2016   ALBUMIN 2.4 (L) 06/13/2016   BILITOT 0.5 06/13/2016   ALKPHOS 149 (H) 06/13/2016   AST 34 06/13/2016   ALT 18 06/13/2016  .   Total Time in preparing paper work, data evaluation and todays exam - 35 minutes  Leroy Sea M.D on 06/19/2016 at 10:59 AM  [facility]   Office  [phone]

## 2016-06-19 NOTE — Progress Notes (Signed)
Patient is to be discharged home and in stable condition. Telemetry removed. Patient and son present for discharge teaching. Patient verbalizes understanding of home medication regimen and need for follow-up appointments. Patient will be escorted out by staff.   Quita SkyeMorgan P Dishmon, RN

## 2016-06-19 NOTE — Discharge Instructions (Signed)
Follow with Primary MD TAPPER,[name], MD in 7 days   Get CBC, CMP, 2 view Chest X ray checked  by Primary MD in 5-7 days ( we routinely change or add medications that can affect your baseline labs and fluid status, therefore we recommend that you get the mentioned basic workup next visit with your PCP, your PCP may decide not to get them or add new tests based on their clinical decision)   Activity: As tolerated with Full fall precautions use walker/cane & assistance as needed   Disposition Home     Diet:   Diet heart healthy/carb modified  Check your Weight same time everyday, if you gain over 2 pounds, or you develop in leg swelling, experience more shortness of breath or chest pain, call your Primary MD immediately. Follow Cardiac Low Salt Diet and 1.5 lit/day fluid restriction.   On your next visit with your primary care physician please Get Medicines reviewed and adjusted.   Please request your Prim.MD to go over all Hospital Tests and Procedure/Radiological results at the follow up, please get all Hospital records sent to your Prim MD by signing hospital release before you go home.   If you experience worsening of your admission symptoms, develop shortness of breath, life threatening emergency, suicidal or homicidal thoughts you must seek medical attention immediately by calling 911 or calling your MD immediately  if symptoms less severe.  You Must read complete instructions/literature along with all the possible adverse reactions/side effects for all the Medicines you take and that have been prescribed to you. Take any new Medicines after you have completely understood and accpet all the possible adverse reactions/side effects.   Do not drive, operate heavy machinery, perform activities at heights, swimming or participation in water activities or provide baby sitting services if your were admitted for syncope or siezures until you have seen by Primary MD or a Neurologist and advised  to do so again.  Do not drive when taking Pain medications.    Do not take more than prescribed Pain, Sleep and Anxiety Medications  Special Instructions: If you have smoked or chewed Tobacco  in the last 2 yrs please stop smoking, stop any regular Alcohol  and or any Recreational drug use.  Wear Seat belts while driving.   Please note  You were cared for by a hospitalist during your hospital stay. If you have any questions about your discharge medications or the care you received while you were in the hospital after you are discharged, you can call the unit and asked to speak with the hospitalist on call if the hospitalist that took care of you is not available. Once you are discharged, your primary care physician will handle any further medical issues. Please note that NO REFILLS for any discharge medications will be authorized once you are discharged, as it is imperative that you return to your primary care physician (or establish a relationship with a primary care physician if you do not have one) for your aftercare needs so that they can reassess your need for medications and monitor your lab values.

## 2016-06-19 NOTE — Progress Notes (Signed)
Inpatient Diabetes Program Recommendations  AACE/ADA: New Consensus Statement on Inpatient Glycemic Control (2015)  Target Ranges:  Prepandial:   less than 140 mg/dL      Peak postprandial:   less than 180 mg/dL (1-2 hours)      Critically ill patients:  140 - 180 mg/dL   Lab Results  Component Value Date   GLUCAP 213 (H) 06/19/2016   HGBA1C 11.9 (H) 02/27/2016    Review of Glycemic Control Results for [name], Sophia A (MRN [id number]) as of 06/19/2016 08:56  Ref. Range 06/18/2016 07:12 06/18/2016 11:12 06/18/2016 16:18 06/18/2016 20:49 06/19/2016 07:36  Glucose-Capillary Latest Ref Range: 65 - 99 mg/dL 725200 (H) 366187 (H) 440206 (H) 276 (H) 213 (H)   Inpatient Diabetes Program Recommendations:    Please consider restarting Lantus insulin @ 5 units qd and increase Novolog meal coverage to 3 units tid if eats 50%.  Thank you, Billy FischerJudy E. [place] Luppino, RN, MSN, CDE Inpatient Glycemic Control Team Team Pager [phone] (8am-5pm) 06/19/2016 8:59 AM

## 2016-06-19 NOTE — Progress Notes (Signed)
MD made aware of systolic above 160 per order, will continue to monitor.

## 2016-06-19 NOTE — Progress Notes (Signed)
Patient's oxygen saturation fell to 64% sitting on room air,  Upon application of 2L oxygen saturation came back up to 92%

## 2016-06-21 ENCOUNTER — Other Ambulatory Visit: Payer: Self-pay | Admitting: Internal Medicine

## 2016-06-21 ENCOUNTER — Ambulatory Visit (INDEPENDENT_AMBULATORY_CARE_PROVIDER_SITE_OTHER): Payer: BLUE CROSS/BLUE SHIELD | Admitting: Cardiovascular Disease

## 2016-06-21 ENCOUNTER — Encounter: Payer: Self-pay | Admitting: Cardiovascular Disease

## 2016-06-21 VITALS — BP 164/93 | HR 89 | Ht 63.0 in | Wt 237.0 lb

## 2016-06-21 DIAGNOSIS — Z9289 Personal history of other medical treatment: Secondary | ICD-10-CM

## 2016-06-21 DIAGNOSIS — I5031 Acute diastolic (congestive) heart failure: Secondary | ICD-10-CM

## 2016-06-21 DIAGNOSIS — Z716 Tobacco abuse counseling: Secondary | ICD-10-CM

## 2016-06-21 DIAGNOSIS — I110 Hypertensive heart disease with heart failure: Secondary | ICD-10-CM | POA: Diagnosis not present

## 2016-06-21 DIAGNOSIS — E7800 Pure hypercholesterolemia, unspecified: Secondary | ICD-10-CM

## 2016-06-21 NOTE — Progress Notes (Signed)
CARDIOLOGY CONSULT NOTE  Patient ID: [name] MRN: [id number] DOB/AGE: Dec 05, 1955 61 y.o.  Admit date: (Not on file) Primary Physician: [name], MD Referring Physician: Thedore Mins  Reason for Consultation: diastolic heart failure  HPI: 61 yr old woman recently hospitalized for acute diastolic heart failure. She has hypertension, hyperlipidemia, diabetes, CKD stage III, and COPD. Also had MRSA bacteremia for small abscess on right leg. BNP 1011 on admission.  Echocardiogram 06/14/16: Normal LV systolic function, EF 60-65%, moderate LVH, grade 1 diastolic dysfunction, moderate right atrial and ventricular dilatation, and small pericardial effusion, PASP 42 mmHg.  She has yet to pick up her diuretics and antihypertensives from the pharmacy. She continues to smoke.  She uses 2 L of oxygen chronically. Leg swelling has improved as per patient. Denies chest pain.    Allergies  Allergen Reactions  . Other     Per pt some type of mycin "some antibiotic sent me into renal failure."  . Penicillins Rash    Tolerated Zosyn in 07/2015 Has patient had a PCN reaction causing immediate rash, facial/tongue/throat swelling, SOB or lightheadedness with hypotension: No Has patient had a PCN reaction causing severe rash involving mucus membranes or skin necrosis: No Has patient had a PCN reaction that required hospitalization No Has patient had a PCN reaction occurring within the last 10 years: No If all of the above answers are "NO", then may proceed with Cephalosporin use.     Current Outpatient Prescriptions  Medication Sig Dispense Refill  . albuterol (PROAIR HFA) 108 (90 Base) MCG/ACT inhaler Inhale 2 puffs into the lungs every 6 (six) hours as needed for wheezing or shortness of breath.    Marland Kitchen aspirin EC 325 MG tablet Take 325 mg by mouth daily.    . collagenase (SANTYL) ointment Apply in a nickel thickness to left achilles wound in the wound bed only, being careful not  to get on surrounding skin.  Cover with dry gauze. 15 g 0  . doxycycline (VIBRA-TABS) 100 MG tablet Take 1 tablet (100 mg total) by mouth 2 (two) times daily. 10 tablet 0  . FLUoxetine (PROZAC) 20 MG capsule Take 20 mg by mouth 2 (two) times daily.   1  . hydrALAZINE (APRESOLINE) 50 MG tablet Take 1 tablet (50 mg total) by mouth 3 (three) times daily. 90 tablet 0  . HYDROmorphone (DILAUDID) 4 MG tablet Take 4 mg by mouth 2 (two) times daily as needed. For pain  0  . insulin aspart (NOVOLOG FLEXPEN) 100 UNIT/ML FlexPen Inject 3 Units into the skin 3 (three) times daily with meals.    . insulin glargine (LANTUS) 100 UNIT/ML injection Inject 0.07 mLs (7 Units total) into the skin daily. 10 mL 11  . iron polysaccharides (NIFEREX) 150 MG capsule Take 1 capsule (150 mg total) by mouth 2 (two) times daily. 60 capsule 0  . metoprolol tartrate (LOPRESSOR) 25 MG tablet Take 1 tablet (25 mg total) by mouth 2 (two) times daily. 60 tablet 0  . Multiple Vitamin (MULTIVITAMIN WITH MINERALS) TABS tablet Take 1 tablet by mouth daily.    . mupirocin ointment (BACTROBAN) 2 % Apply topically 3 (three) times daily. 22 g 0  . pantoprazole (PROTONIX) 40 MG tablet Take 1 tablet (40 mg total) by mouth daily. 30 tablet 2  . pravastatin (PRAVACHOL) 40 MG tablet Take 40 mg by mouth at bedtime.   2  . QUEtiapine (SEROQUEL) 25 MG tablet Take 1 tablet by mouth at  bedtime.   1  . torsemide (DEMADEX) 20 MG tablet Take 1 tablet (20 mg total) by mouth 2 (two) times daily. 60 tablet 0   No current facility-administered medications for this visit.     Past Medical History:  Diagnosis Date  . Anxiety   . Depression   . GERD (gastroesophageal reflux disease)   . High cholesterol   . Hypertension   . Type II diabetes mellitus (HCC)     Past Surgical History:  Procedure Laterality Date  . APPLICATION OF WOUND VAC Right 10/25/2015   Procedure: APPLICATION OF WOUND VAC;  Surgeon: Franky MachoMark Jenkins, MD;  Location: [facility];  Service:  General;  Laterality: Right;  . CESAREAN SECTION  1984  . INCISION AND DRAINAGE Right 08/09/2015   Procedure: INCISION AND DRAINAGE;  Surgeon: De BlanchLuke Aaron Kinsinger, MD;  [facility];  Service: General;  Laterality: Right;  . INCISION AND DRAINAGE ABSCESS Right 08/08/2015   Procedure: Irrigation and Debridement of Right Lower Ext.;  Surgeon: Axel FillerArmando Ramirez, MD;  Location: [facility];  Service: General;  Laterality: Right;  . INCISION AND DRAINAGE ABSCESS Right 10/25/2015   Procedure: INCISION AND DRAINAGE ABSCESS RIGHT AXILLARY ABSCESS;  Surgeon: Franky MachoMark Jenkins, MD;  Location: [facility];  Service: General;  Laterality: Right;    Social History   Social History  . Marital status: Divorced    Spouse name: N/A  . Number of children: N/A  . Years of education: N/A   Occupational History  . Not on file.   Social History Main Topics  . Smoking status: Current Every Day Smoker    Packs/day: 0.10    Years: 35.00    Types: Cigarettes  . Smokeless tobacco: Never Used  . Alcohol use 0.6 oz/week    1 Glasses of wine per week  . Drug use: No  . Sexual activity: Not Currently   Other Topics Concern  . Not on file   Social History Narrative  . No narrative on file     No family history of premature CAD in 1st degree relatives.  Prior to Admission medications   Medication Sig Start Date End Date Taking? Authorizing Provider  albuterol (PROAIR HFA) 108 (90 Base) MCG/ACT inhaler Inhale 2 puffs into the lungs every 6 (six) hours as needed for wheezing or shortness of breath.   Yes Historical Provider, MD  aspirin EC 325 MG tablet Take 325 mg by mouth daily.   Yes Historical Provider, MD  collagenase (SANTYL) ointment Apply in a nickel thickness to left achilles wound in the wound bed only, being careful not to get on surrounding skin.  Cover with dry gauze. 02/03/16  Yes Beather Arbourushil V Patel, MD  doxycycline (VIBRA-TABS) 100 MG tablet Take 1 tablet (100 mg total) by mouth 2 (two) times daily. 06/19/16  Yes  Leroy SeaPrashant K Singh, MD  FLUoxetine (PROZAC) 20 MG capsule Take 20 mg by mouth 2 (two) times daily.  05/11/15  Yes Historical Provider, MD  hydrALAZINE (APRESOLINE) 50 MG tablet Take 1 tablet (50 mg total) by mouth 3 (three) times daily. 06/19/16  Yes Leroy SeaPrashant K Singh, MD  HYDROmorphone (DILAUDID) 4 MG tablet Take 4 mg by mouth 2 (two) times daily as needed. For pain 11/09/15  Yes Historical Provider, MD  insulin aspart (NOVOLOG FLEXPEN) 100 UNIT/ML FlexPen Inject 3 Units into the skin 3 (three) times daily with meals.   Yes Historical Provider, MD  insulin glargine (LANTUS) 100 UNIT/ML injection Inject 0.07 mLs (7 Units total) into the  skin daily. 03/05/16  Yes Erick Blinks, MD  iron polysaccharides (NIFEREX) 150 MG capsule Take 1 capsule (150 mg total) by mouth 2 (two) times daily. 03/05/16  Yes Erick Blinks, MD  metoprolol tartrate (LOPRESSOR) 25 MG tablet Take 1 tablet (25 mg total) by mouth 2 (two) times daily. 09/21/15  Yes Leroy Sea, MD  Multiple Vitamin (MULTIVITAMIN WITH MINERALS) TABS tablet Take 1 tablet by mouth daily. 10/26/15  Yes Henderson Cloud, MD  mupirocin ointment (BACTROBAN) 2 % Apply topically 3 (three) times daily. 03/05/16  Yes Erick Blinks, MD  pantoprazole (PROTONIX) 40 MG tablet Take 1 tablet (40 mg total) by mouth daily. 02/03/16  Yes Beather Arbour, MD  pravastatin (PRAVACHOL) 40 MG tablet Take 40 mg by mouth at bedtime.  05/11/15  Yes Historical Provider, MD  QUEtiapine (SEROQUEL) 25 MG tablet Take 1 tablet by mouth at bedtime.  12/22/15  Yes Historical Provider, MD  torsemide (DEMADEX) 20 MG tablet Take 1 tablet (20 mg total) by mouth 2 (two) times daily. 06/19/16  Yes Leroy Sea, MD     Review of systems complete and found to be negative unless listed above in HPI     Physical exam Blood pressure (!) 164/93, pulse 89, height 5\' 3"  (1.6 m), weight 237 lb (107.5 kg), SpO2 94 %. General: NAD Neck: No JVD, no thyromegaly or thyroid nodule.  Lungs:  Diminished throughout, no rales/wheezes. CV: Nondisplaced PMI. Regular rate and rhythm, normal S1/S2,  +S3, no S4, no murmur.  Brawny pretibial edema b/l.  No carotid bruit.    Abdomen: Soft, nontender, obese. Skin: Chronic leg stasis dermatitis. Neurologic: Alert and oriented x 3.  Psych: Normal affect. HEENT: Normal.   ECG: Most recent ECG reviewed.  Labs:   Lab Results  Component Value Date   WBC 6.2 06/13/2016   HGB 10.9 (L) 06/13/2016   HCT 34.1 (L) 06/13/2016   MCV 96.3 06/13/2016   PLT 285 06/13/2016    Recent Labs Lab 06/19/16 0519  NA 139  K 4.3  CL 95*  CO2 38*  BUN 14  CREATININE 1.13*  CALCIUM 8.7*  GLUCOSE 232*   Lab Results  Component Value Date   CKTOTAL 142 09/09/2015   TROPONINI <0.03 06/13/2016    Lab Results  Component Value Date   CHOL 127 08/08/2015   Lab Results  Component Value Date   HDL 11 (L) 08/08/2015   Lab Results  Component Value Date   LDLCALC 74 08/08/2015   Lab Results  Component Value Date   TRIG 351 (H) 08/09/2015   TRIG 232 (H) 08/08/2015   TRIG 210 (H) 08/08/2015   Lab Results  Component Value Date   CHOLHDL 11.5 08/08/2015   No results found for: LDLDIRECT       Studies: No results found.  ASSESSMENT AND PLAN:  1. Acute diastolic heart failure: Aim to control BP to avoid decompensation. BNP 1011 on admission. Continue torsemide 20 mg bid. Encouraged to pick up diuretics and antihypertensive meds today. We had a long discussion about a low sodium diet.  2. Essential HTN: Markedly elevated.  Encouraged to pick up diuretics and antihypertensive meds today.  3. 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  4. Hyperlipidemia: Continue statin.  5. Tobacco abuse: Cessation counseling provided (1 minute).  Dispo: fu 3 months    Signed: Prentice Docker, M.D., F.A.C.C.  06/21/2016, 2:03 PM

## 2016-06-21 NOTE — Patient Instructions (Signed)
Your physician recommends that you schedule a follow-up appointment in: 3 months with Dr Purvis SheffieldKoneswaran   Please pick up your medications from the pharamcy  Your physician recommends that you continue on your current medications as directed. Please refer to the Current Medication list given to you today.     Thank you for choosing [facility] !

## 2016-08-25 ENCOUNTER — Inpatient Hospital Stay (HOSPITAL_COMMUNITY): Payer: BLUE CROSS/BLUE SHIELD

## 2016-08-25 ENCOUNTER — Emergency Department (HOSPITAL_COMMUNITY): Payer: BLUE CROSS/BLUE SHIELD

## 2016-08-25 ENCOUNTER — Inpatient Hospital Stay (HOSPITAL_COMMUNITY)
Admission: EM | Admit: 2016-08-25 | Discharge: 2016-09-08 | DRG: 291 | Disposition: E | Payer: BLUE CROSS/BLUE SHIELD | Attending: Internal Medicine | Admitting: Internal Medicine

## 2016-08-25 ENCOUNTER — Encounter (HOSPITAL_COMMUNITY): Payer: Self-pay | Admitting: *Deleted

## 2016-08-25 DIAGNOSIS — Z9981 Dependence on supplemental oxygen: Secondary | ICD-10-CM | POA: Diagnosis not present

## 2016-08-25 DIAGNOSIS — R188 Other ascites: Secondary | ICD-10-CM

## 2016-08-25 DIAGNOSIS — R0602 Shortness of breath: Secondary | ICD-10-CM | POA: Diagnosis present

## 2016-08-25 DIAGNOSIS — D631 Anemia in chronic kidney disease: Secondary | ICD-10-CM | POA: Diagnosis present

## 2016-08-25 DIAGNOSIS — K219 Gastro-esophageal reflux disease without esophagitis: Secondary | ICD-10-CM | POA: Diagnosis present

## 2016-08-25 DIAGNOSIS — D638 Anemia in other chronic diseases classified elsewhere: Secondary | ICD-10-CM | POA: Diagnosis not present

## 2016-08-25 DIAGNOSIS — F419 Anxiety disorder, unspecified: Secondary | ICD-10-CM | POA: Diagnosis present

## 2016-08-25 DIAGNOSIS — I319 Disease of pericardium, unspecified: Secondary | ICD-10-CM | POA: Diagnosis not present

## 2016-08-25 DIAGNOSIS — Z6841 Body Mass Index (BMI) 40.0 and over, adult: Secondary | ICD-10-CM

## 2016-08-25 DIAGNOSIS — K5900 Constipation, unspecified: Secondary | ICD-10-CM | POA: Diagnosis not present

## 2016-08-25 DIAGNOSIS — Z9119 Patient's noncompliance with other medical treatment and regimen: Secondary | ICD-10-CM | POA: Diagnosis not present

## 2016-08-25 DIAGNOSIS — E7800 Pure hypercholesterolemia, unspecified: Secondary | ICD-10-CM | POA: Diagnosis present

## 2016-08-25 DIAGNOSIS — Z8249 Family history of ischemic heart disease and other diseases of the circulatory system: Secondary | ICD-10-CM

## 2016-08-25 DIAGNOSIS — E1122 Type 2 diabetes mellitus with diabetic chronic kidney disease: Secondary | ICD-10-CM | POA: Diagnosis present

## 2016-08-25 DIAGNOSIS — E785 Hyperlipidemia, unspecified: Secondary | ICD-10-CM | POA: Diagnosis present

## 2016-08-25 DIAGNOSIS — I313 Pericardial effusion (noninflammatory): Secondary | ICD-10-CM | POA: Diagnosis present

## 2016-08-25 DIAGNOSIS — J9621 Acute and chronic respiratory failure with hypoxia: Secondary | ICD-10-CM | POA: Diagnosis present

## 2016-08-25 DIAGNOSIS — N183 Chronic kidney disease, stage 3 unspecified: Secondary | ICD-10-CM | POA: Diagnosis present

## 2016-08-25 DIAGNOSIS — I5033 Acute on chronic diastolic (congestive) heart failure: Secondary | ICD-10-CM | POA: Insufficient documentation

## 2016-08-25 DIAGNOSIS — I3139 Other pericardial effusion (noninflammatory): Secondary | ICD-10-CM

## 2016-08-25 DIAGNOSIS — J441 Chronic obstructive pulmonary disease with (acute) exacerbation: Secondary | ICD-10-CM | POA: Diagnosis present

## 2016-08-25 DIAGNOSIS — I469 Cardiac arrest, cause unspecified: Secondary | ICD-10-CM | POA: Diagnosis not present

## 2016-08-25 DIAGNOSIS — Z9114 Patient's other noncompliance with medication regimen: Secondary | ICD-10-CM

## 2016-08-25 DIAGNOSIS — Z841 Family history of disorders of kidney and ureter: Secondary | ICD-10-CM

## 2016-08-25 DIAGNOSIS — E870 Hyperosmolality and hypernatremia: Secondary | ICD-10-CM | POA: Diagnosis present

## 2016-08-25 DIAGNOSIS — I10 Essential (primary) hypertension: Secondary | ICD-10-CM | POA: Diagnosis not present

## 2016-08-25 DIAGNOSIS — Z992 Dependence on renal dialysis: Secondary | ICD-10-CM

## 2016-08-25 DIAGNOSIS — I471 Supraventricular tachycardia, unspecified: Secondary | ICD-10-CM | POA: Diagnosis present

## 2016-08-25 DIAGNOSIS — Z794 Long term (current) use of insulin: Secondary | ICD-10-CM | POA: Diagnosis not present

## 2016-08-25 DIAGNOSIS — R109 Unspecified abdominal pain: Secondary | ICD-10-CM

## 2016-08-25 DIAGNOSIS — IMO0001 Reserved for inherently not codable concepts without codable children: Secondary | ICD-10-CM

## 2016-08-25 DIAGNOSIS — Z452 Encounter for adjustment and management of vascular access device: Secondary | ICD-10-CM

## 2016-08-25 DIAGNOSIS — E8809 Other disorders of plasma-protein metabolism, not elsewhere classified: Secondary | ICD-10-CM

## 2016-08-25 DIAGNOSIS — J189 Pneumonia, unspecified organism: Secondary | ICD-10-CM | POA: Diagnosis present

## 2016-08-25 DIAGNOSIS — Z7982 Long term (current) use of aspirin: Secondary | ICD-10-CM | POA: Diagnosis not present

## 2016-08-25 DIAGNOSIS — Z833 Family history of diabetes mellitus: Secondary | ICD-10-CM

## 2016-08-25 DIAGNOSIS — F17210 Nicotine dependence, cigarettes, uncomplicated: Secondary | ICD-10-CM | POA: Diagnosis present

## 2016-08-25 DIAGNOSIS — E119 Type 2 diabetes mellitus without complications: Secondary | ICD-10-CM | POA: Diagnosis not present

## 2016-08-25 DIAGNOSIS — I5031 Acute diastolic (congestive) heart failure: Secondary | ICD-10-CM | POA: Diagnosis not present

## 2016-08-25 DIAGNOSIS — Z9111 Patient's noncompliance with dietary regimen: Secondary | ICD-10-CM | POA: Diagnosis not present

## 2016-08-25 DIAGNOSIS — I5043 Acute on chronic combined systolic (congestive) and diastolic (congestive) heart failure: Secondary | ICD-10-CM | POA: Diagnosis present

## 2016-08-25 DIAGNOSIS — Z880 Allergy status to penicillin: Secondary | ICD-10-CM

## 2016-08-25 DIAGNOSIS — Z91199 Patient's noncompliance with other medical treatment and regimen due to unspecified reason: Secondary | ICD-10-CM

## 2016-08-25 DIAGNOSIS — I130 Hypertensive heart and chronic kidney disease with heart failure and stage 1 through stage 4 chronic kidney disease, or unspecified chronic kidney disease: Principal | ICD-10-CM | POA: Diagnosis present

## 2016-08-25 DIAGNOSIS — J9601 Acute respiratory failure with hypoxia: Secondary | ICD-10-CM

## 2016-08-25 DIAGNOSIS — I509 Heart failure, unspecified: Secondary | ICD-10-CM | POA: Insufficient documentation

## 2016-08-25 HISTORY — DX: Dependence on supplemental oxygen: Z99.81

## 2016-08-25 HISTORY — DX: Supraventricular tachycardia: I47.1

## 2016-08-25 HISTORY — DX: Patient's noncompliance with other medical treatment and regimen: Z91.19

## 2016-08-25 HISTORY — DX: Anemia, unspecified: D64.9

## 2016-08-25 HISTORY — DX: Chronic kidney disease, stage 3 unspecified: N18.30

## 2016-08-25 HISTORY — DX: Morbid (severe) obesity due to excess calories: E66.01

## 2016-08-25 HISTORY — DX: Patient's noncompliance with other medical treatment and regimen due to unspecified reason: Z91.199

## 2016-08-25 HISTORY — DX: Supraventricular tachycardia, unspecified: I47.10

## 2016-08-25 HISTORY — DX: Chronic respiratory failure, unspecified whether with hypoxia or hypercapnia: J96.10

## 2016-08-25 HISTORY — DX: Chronic kidney disease, stage 3 (moderate): N18.3

## 2016-08-25 HISTORY — DX: Cutaneous abscess, unspecified: L02.91

## 2016-08-25 LAB — COMPREHENSIVE METABOLIC PANEL
ALT: 19 U/L (ref 14–54)
AST: 29 U/L (ref 15–41)
Albumin: 2.8 g/dL — ABNORMAL LOW (ref 3.5–5.0)
Alkaline Phosphatase: 197 U/L — ABNORMAL HIGH (ref 38–126)
Anion gap: 7 (ref 5–15)
BILIRUBIN TOTAL: 0.8 mg/dL (ref 0.3–1.2)
BUN: 21 mg/dL — AB (ref 6–20)
CO2: 32 mmol/L (ref 22–32)
CREATININE: 1.39 mg/dL — AB (ref 0.44–1.00)
Calcium: 8.8 mg/dL — ABNORMAL LOW (ref 8.9–10.3)
Chloride: 103 mmol/L (ref 101–111)
GFR calc non Af Amer: 40 mL/min — ABNORMAL LOW (ref >60)
GFR, EST AFRICAN AMERICAN: 46 mL/min — AB (ref >60)
Glucose, Bld: 130 mg/dL — ABNORMAL HIGH (ref 65–99)
POTASSIUM: 3.7 mmol/L (ref 3.5–5.1)
Sodium: 142 mmol/L (ref 135–145)
Total Protein: 7.9 g/dL (ref 6.5–8.1)

## 2016-08-25 LAB — BLOOD GAS, ARTERIAL
Acid-Base Excess: 5.9 mmol/L — ABNORMAL HIGH (ref 0.0–2.0)
Bicarbonate: 29.2 mmol/L — ABNORMAL HIGH (ref 20.0–28.0)
Delivery systems: POSITIVE
Drawn by: 317771
Expiratory PAP: 6
FIO2: 1
INSPIRATORY PAP: 14
O2 CONTENT: 100 L/min
O2 Saturation: 99.1 %
RATE: 8 resp/min
pCO2 arterial: 60.3 mmHg — ABNORMAL HIGH (ref 32.0–48.0)
pH, Arterial: 7.337 — ABNORMAL LOW (ref 7.350–7.450)
pO2, Arterial: 291 mmHg — ABNORMAL HIGH (ref 83.0–108.0)

## 2016-08-25 LAB — URINALYSIS, ROUTINE W REFLEX MICROSCOPIC
BILIRUBIN URINE: NEGATIVE
GLUCOSE, UA: NEGATIVE mg/dL
Hgb urine dipstick: NEGATIVE
Ketones, ur: NEGATIVE mg/dL
LEUKOCYTES UA: NEGATIVE
Nitrite: NEGATIVE
PH: 5 (ref 5.0–8.0)
PROTEIN: NEGATIVE mg/dL
Specific Gravity, Urine: 1.005 (ref 1.005–1.030)

## 2016-08-25 LAB — MRSA PCR SCREENING: MRSA by PCR: NEGATIVE

## 2016-08-25 LAB — I-STAT CHEM 8, ED
BUN: 12 mg/dL (ref 6–20)
CALCIUM ION: 1.16 mmol/L (ref 1.15–1.40)
CREATININE: 1.4 mg/dL — AB (ref 0.44–1.00)
Chloride: 98 mmol/L — ABNORMAL LOW (ref 101–111)
GLUCOSE: 131 mg/dL — AB (ref 65–99)
HCT: 28 % — ABNORMAL LOW (ref 36.0–46.0)
HEMOGLOBIN: 9.5 g/dL — AB (ref 12.0–15.0)
Potassium: 3.6 mmol/L (ref 3.5–5.1)
Sodium: 145 mmol/L (ref 135–145)
TCO2: 34 mmol/L (ref 0–100)

## 2016-08-25 LAB — I-STAT TROPONIN, ED: TROPONIN I, POC: 0 ng/mL (ref 0.00–0.08)

## 2016-08-25 LAB — I-STAT CG4 LACTIC ACID, ED: Lactic Acid, Venous: 1.84 mmol/L (ref 0.5–1.9)

## 2016-08-25 LAB — CBC WITH DIFFERENTIAL/PLATELET
BASOS ABS: 0.1 10*3/uL (ref 0.0–0.1)
BASOS PCT: 1 %
Eosinophils Absolute: 0.1 10*3/uL (ref 0.0–0.7)
Eosinophils Relative: 1 %
HCT: 27.3 % — ABNORMAL LOW (ref 36.0–46.0)
HEMOGLOBIN: 8.8 g/dL — AB (ref 12.0–15.0)
Lymphocytes Relative: 22 %
Lymphs Abs: 1.5 10*3/uL (ref 0.7–4.0)
MCH: 28.9 pg (ref 26.0–34.0)
MCHC: 32.2 g/dL (ref 30.0–36.0)
MCV: 89.8 fL (ref 78.0–100.0)
MONOS PCT: 12 %
Monocytes Absolute: 0.8 10*3/uL (ref 0.1–1.0)
NEUTROS ABS: 4.5 10*3/uL (ref 1.7–7.7)
NEUTROS PCT: 64 %
Platelets: 295 10*3/uL (ref 150–400)
RBC: 3.04 MIL/uL — AB (ref 3.87–5.11)
RDW: 17.9 % — ABNORMAL HIGH (ref 11.5–15.5)
WBC: 7 10*3/uL (ref 4.0–10.5)

## 2016-08-25 LAB — TROPONIN I

## 2016-08-25 LAB — PROCALCITONIN: Procalcitonin: <0.1 ng/mL

## 2016-08-25 LAB — GLUCOSE, CAPILLARY
Glucose-Capillary: 145 mg/dL — ABNORMAL HIGH (ref 65–99)
Glucose-Capillary: 147 mg/dL — ABNORMAL HIGH (ref 65–99)
Glucose-Capillary: 127 mg/dL — ABNORMAL HIGH (ref 65–99)

## 2016-08-25 LAB — BRAIN NATRIURETIC PEPTIDE: B NATRIURETIC PEPTIDE 5: 1208 pg/mL — AB (ref 0.0–100.0)

## 2016-08-25 MED ORDER — ALBUTEROL SULFATE (2.5 MG/3ML) 0.083% IN NEBU
3.0000 mL | INHALATION_SOLUTION | Freq: Four times a day (QID) | RESPIRATORY_TRACT | Status: DC | PRN
  Administered 2016-08-30: 3 mL via RESPIRATORY_TRACT
  Filled 2016-08-25: qty 3

## 2016-08-25 MED ORDER — SODIUM CHLORIDE 0.9% FLUSH
3.0000 mL | Freq: Two times a day (BID) | INTRAVENOUS | Status: DC
  Administered 2016-08-25 – 2016-08-27 (×5): 3 mL via INTRAVENOUS
  Administered 2016-08-27: 22:00:00 via INTRAVENOUS
  Administered 2016-08-28 – 2016-08-30 (×6): 3 mL via INTRAVENOUS

## 2016-08-25 MED ORDER — HYDRALAZINE HCL 25 MG PO TABS
50.0000 mg | ORAL_TABLET | Freq: Three times a day (TID) | ORAL | Status: DC
  Administered 2016-08-25 – 2016-08-30 (×18): 50 mg via ORAL
  Filled 2016-08-25 (×18): qty 2

## 2016-08-25 MED ORDER — METHYLPREDNISOLONE SODIUM SUCC 125 MG IJ SOLR
INTRAMUSCULAR | Status: AC
  Filled 2016-08-25: qty 2

## 2016-08-25 MED ORDER — METOPROLOL TARTRATE 25 MG PO TABS
25.0000 mg | ORAL_TABLET | Freq: Two times a day (BID) | ORAL | Status: DC
  Administered 2016-08-25 – 2016-08-27 (×6): 25 mg via ORAL
  Filled 2016-08-25 (×6): qty 1

## 2016-08-25 MED ORDER — INSULIN ASPART 100 UNIT/ML ~~LOC~~ SOLN
0.0000 [IU] | Freq: Three times a day (TID) | SUBCUTANEOUS | Status: DC
  Administered 2016-08-25 – 2016-08-26 (×4): 1 [IU] via SUBCUTANEOUS
  Administered 2016-08-26: 2 [IU] via SUBCUTANEOUS
  Administered 2016-08-27 – 2016-08-30 (×7): 1 [IU] via SUBCUTANEOUS

## 2016-08-25 MED ORDER — ACETAMINOPHEN 325 MG PO TABS
650.0000 mg | ORAL_TABLET | ORAL | Status: DC | PRN
  Administered 2016-08-28 (×2): 650 mg via ORAL
  Filled 2016-08-25 (×2): qty 2

## 2016-08-25 MED ORDER — IPRATROPIUM-ALBUTEROL 0.5-2.5 (3) MG/3ML IN SOLN
3.0000 mL | Freq: Once | RESPIRATORY_TRACT | Status: AC
  Administered 2016-08-25: 3 mL via RESPIRATORY_TRACT
  Filled 2016-08-25: qty 3

## 2016-08-25 MED ORDER — FUROSEMIDE 10 MG/ML IJ SOLN
40.0000 mg | Freq: Two times a day (BID) | INTRAMUSCULAR | Status: DC
  Administered 2016-08-25: 40 mg via INTRAVENOUS
  Filled 2016-08-25: qty 4

## 2016-08-25 MED ORDER — FLUOXETINE HCL 20 MG PO CAPS
20.0000 mg | ORAL_CAPSULE | Freq: Two times a day (BID) | ORAL | Status: DC
  Administered 2016-08-25 – 2016-08-30 (×12): 20 mg via ORAL
  Filled 2016-08-25 (×12): qty 1

## 2016-08-25 MED ORDER — SODIUM CHLORIDE 0.9 % IV SOLN: 250.0000 mL | INTRAVENOUS | Status: DC | PRN

## 2016-08-25 MED ORDER — SODIUM CHLORIDE 0.9% FLUSH: 3.0000 mL | INTRAVENOUS | Status: DC | PRN

## 2016-08-25 MED ORDER — LEVOFLOXACIN IN D5W 500 MG/100ML IV SOLN
500.0000 mg | Freq: Once | INTRAVENOUS | Status: DC
  Administered 2016-08-25: 500 mg via INTRAVENOUS
  Filled 2016-08-25: qty 100

## 2016-08-25 MED ORDER — LORAZEPAM 1 MG PO TABS
1.0000 mg | ORAL_TABLET | Freq: Once | ORAL | Status: AC
  Administered 2016-08-25: 1 mg via ORAL
  Filled 2016-08-25: qty 1

## 2016-08-25 MED ORDER — FUROSEMIDE 10 MG/ML IJ SOLN
80.0000 mg | Freq: Once | INTRAMUSCULAR | Status: AC
  Administered 2016-08-25: 80 mg via INTRAVENOUS

## 2016-08-25 MED ORDER — NITROGLYCERIN IN D5W 200-5 MCG/ML-% IV SOLN
5.0000 ug/min | Freq: Once | INTRAVENOUS | Status: AC
  Administered 2016-08-25: 5 ug/min via INTRAVENOUS
  Filled 2016-08-25: qty 250

## 2016-08-25 MED ORDER — ONDANSETRON HCL 4 MG/2ML IJ SOLN
4.0000 mg | Freq: Four times a day (QID) | INTRAMUSCULAR | Status: DC | PRN
  Administered 2016-08-28: 4 mg via INTRAVENOUS
  Filled 2016-08-25: qty 2

## 2016-08-25 MED ORDER — ORAL CARE MOUTH RINSE
15.0000 mL | Freq: Two times a day (BID) | OROMUCOSAL | Status: DC
  Administered 2016-08-25 – 2016-08-30 (×11): 15 mL via OROMUCOSAL

## 2016-08-25 MED ORDER — POTASSIUM CHLORIDE CRYS ER 20 MEQ PO TBCR
60.0000 meq | EXTENDED_RELEASE_TABLET | Freq: Every day | ORAL | Status: DC
  Administered 2016-08-25 – 2016-08-30 (×6): 60 meq via ORAL
  Filled 2016-08-25 (×6): qty 3

## 2016-08-25 MED ORDER — HYDRALAZINE HCL 20 MG/ML IJ SOLN
10.0000 mg | INTRAMUSCULAR | Status: DC | PRN
  Administered 2016-08-27 – 2016-08-29 (×4): 10 mg via INTRAVENOUS
  Filled 2016-08-25 (×4): qty 1

## 2016-08-25 MED ORDER — ASPIRIN EC 325 MG PO TBEC
325.0000 mg | DELAYED_RELEASE_TABLET | Freq: Every day | ORAL | Status: DC
  Administered 2016-08-25 – 2016-08-30 (×6): 325 mg via ORAL
  Filled 2016-08-25 (×6): qty 1

## 2016-08-25 MED ORDER — FUROSEMIDE 10 MG/ML IJ SOLN
40.0000 mg | Freq: Three times a day (TID) | INTRAMUSCULAR | Status: DC
  Administered 2016-08-25 – 2016-08-26 (×3): 40 mg via INTRAVENOUS
  Filled 2016-08-25 (×3): qty 4

## 2016-08-25 MED ORDER — METHYLPREDNISOLONE SODIUM SUCC 125 MG IJ SOLR
125.0000 mg | Freq: Once | INTRAMUSCULAR | Status: AC
  Administered 2016-08-25: 125 mg via INTRAVENOUS

## 2016-08-25 MED ORDER — FUROSEMIDE 10 MG/ML IJ SOLN
INTRAMUSCULAR | Status: AC
  Administered 2016-08-25: 80 mg via INTRAVENOUS
  Filled 2016-08-25: qty 8

## 2016-08-25 NOTE — Progress Notes (Signed)
Vascular access nurse came back and pulled picc back 3 cm and redressed. Xray verified placement. Patient on HFNC @ 6 tolerating well.

## 2016-08-25 NOTE — ED Provider Notes (Signed)
[facility] Provider Note   CSN: [id number] Arrival date & time: 08/11/2016  0033   By signing my name below, I, Clarisse GougeXavier Herndon, attest that this documentation has been prepared under the direction and in the presence of Glynn OctaveStephen Lorain Fettes, MD. Electronically signed, Clarisse GougeXavier Herndon, ED Scribe. 08/14/2016. 1:33 AM.  History   Chief Complaint Chief Complaint  Patient presents with  . Shortness of Breath   LEVEL 5 CAVEAT D/T RESPIRATORY DISTRESS  HPI  HPI Comments: [name] is a 61 y.o. female BIB EMS who presents to the Emergency Department for respiratory distress. She reports 3-4 days of severe SOB, though EMS reports SOB ~ 1 week. Pt reportedly on 2L of oxygen at home; she was reportedly increased to 4.5 L of oxygen prior to calling EMS without relief. Increased abdominal distension noted. EMS report pt did not tolerate BiPAP or IV attempts and no medications were administered because they were close to [facility] ED when they picked up the pt. They add the pt is anxious and she notes Hx of panic attacks. Hx of asthma, HTN and DM reported. No Hx of dialysis, stent placement or kidney or liver disease. No chest pain, cough, back pain, abdominal pain, dizziness or lightheadedness noted.  Past Medical History:  Diagnosis Date  . Anxiety   . Depression   . GERD (gastroesophageal reflux disease)   . High cholesterol   . Hypertension   . Type II diabetes mellitus [facility](HCC)     Patient Active Problem List   Diagnosis Date Noted  . CHF (congestive heart failure) (HCC) 08/14/2016  . Acute congestive heart failure (HCC) 06/13/2016  . MRSA bacteremia 02/29/2016  . Pressure injury of skin 02/28/2016  . Hyperglycemia 02/27/2016  . Abscess of skin and subcutaneous tissue 02/27/2016  . Hyperosmolarity syndrome 02/27/2016  . h/o dialysis needs spring 2017 02/18/2016  . Paroxysmal SVT (supraventricular tachycardia) (HCC) 02/18/2016  . Emesis 02/18/2016  . Hypokalemia 02/17/2016  . Volume  overload 02/02/2016  . Abdominal pain 02/01/2016  . CKD (chronic kidney disease), stage III 10/24/2015  . Abscess of axilla, right 10/23/2015  . HLD (hyperlipidemia) 10/12/2015  . Anemia of chronic disease 10/12/2015  . Depression 10/12/2015  . UTI (lower urinary tract infection) 09/12/2015  . Generalized exfoliative dermatitis 09/09/2015  . Normocytic anemia 09/09/2015  . Acute respiratory failure with hypoxia (HCC)   . Necrotizing fasciitis (HCC) of inner thigh 08/07/2015  . IDDM (insulin dependent diabetes mellitus) (HCC) 08/07/2015  . History of hypertension 08/07/2015  . Hypertension 08/07/2015    Past Surgical History:  Procedure Laterality Date  . APPLICATION OF WOUND VAC Right 10/25/2015   Procedure: APPLICATION OF WOUND VAC;  Surgeon: Franky MachoMark Jenkins, MD;  Location: [facility] ORS;  Service: General;  Laterality: Right;  . CESAREAN SECTION  1984  . INCISION AND DRAINAGE Right 08/09/2015   Procedure: INCISION AND DRAINAGE;  Surgeon: De BlanchLuke Aaron Kinsinger, MD;  [facility];  Service: General;  Laterality: Right;  . INCISION AND DRAINAGE ABSCESS Right 08/08/2015   Procedure: Irrigation and Debridement of Right Lower Ext.;  Surgeon: Axel FillerArmando Ramirez, MD;  Location: [facility] OR;  Service: General;  Laterality: Right;  . INCISION AND DRAINAGE ABSCESS Right 10/25/2015   Procedure: INCISION AND DRAINAGE ABSCESS RIGHT AXILLARY ABSCESS;  Surgeon: Franky MachoMark Jenkins, MD;  Location: [facility] ORS;  Service: General;  Laterality: Right;    OB History    Gravida Para Term Preterm AB Living   4 1 1    3  SAB TAB Ectopic Multiple Live Births   3               Home Medications    Prior to Admission medications   Medication Sig Start Date End Date Taking? Authorizing Provider  albuterol (PROAIR HFA) 108 (90 Base) MCG/ACT inhaler Inhale 2 puffs into the lungs every 6 (six) hours as needed for wheezing or shortness of breath.   Yes Historical Provider, MD  aspirin EC 325 MG tablet Take 325 mg by mouth daily.   Yes  Historical Provider, MD  cetirizine (ZYRTEC) 10 MG tablet Take 10 mg by mouth daily.   Yes Historical Provider, MD  collagenase (SANTYL) ointment Apply in a nickel thickness to left achilles wound in the wound bed only, being careful not to get on surrounding skin.  Cover with dry gauze. 02/03/16  Yes Rushil Terrilee Croak, MD  doxycycline (VIBRA-TABS) 100 MG tablet Take 1 tablet (100 mg total) by mouth 2 (two) times daily. 06/19/16  Yes Leroy Sea, MD  FLUoxetine (PROZAC) 20 MG capsule Take 20 mg by mouth 2 (two) times daily.  05/11/15  Yes Historical Provider, MD  hydrALAZINE (APRESOLINE) 50 MG tablet Take 1 tablet (50 mg total) by mouth 3 (three) times daily. 06/19/16  Yes Leroy Sea, MD  HYDROmorphone (DILAUDID) 4 MG tablet Take 4 mg by mouth 2 (two) times daily as needed. For pain 11/09/15  Yes Historical Provider, MD  insulin aspart (NOVOLOG FLEXPEN) 100 UNIT/ML FlexPen Inject 3 Units into the skin 3 (three) times daily with meals.   Yes Historical Provider, MD  insulin glargine (LANTUS) 100 UNIT/ML injection Inject 0.07 mLs (7 Units total) into the skin daily. 03/05/16  Yes Erick Blinks, MD  iron polysaccharides (NIFEREX) 150 MG capsule Take 1 capsule (150 mg total) by mouth 2 (two) times daily. 03/05/16  Yes Erick Blinks, MD  metoprolol tartrate (LOPRESSOR) 25 MG tablet Take 1 tablet (25 mg total) by mouth 2 (two) times daily. 09/21/15  Yes Leroy Sea, MD  Multiple Vitamin (MULTIVITAMIN WITH MINERALS) TABS tablet Take 1 tablet by mouth daily. 10/26/15  Yes Henderson Cloud, MD  mupirocin ointment (BACTROBAN) 2 % Apply topically 3 (three) times daily. 03/05/16  Yes Erick Blinks, MD  pantoprazole (PROTONIX) 40 MG tablet Take 1 tablet (40 mg total) by mouth daily. 02/03/16  Yes Rushil Terrilee Croak, MD  pravastatin (PRAVACHOL) 40 MG tablet Take 40 mg by mouth at bedtime.  05/11/15  Yes Historical Provider, MD  QUEtiapine (SEROQUEL) 25 MG tablet Take 1 tablet by mouth at bedtime.  12/22/15   Yes Historical Provider, MD  torsemide (DEMADEX) 20 MG tablet Take 1 tablet (20 mg total) by mouth 2 (two) times daily. 06/19/16  Yes Leroy Sea, MD    Family History Family History  Problem Relation Age of Onset  . Hypertension Mother   . Diabetes Mother   . Kidney failure Father     Social History Social History  Substance Use Topics  . Smoking status: Current Every Day Smoker    Packs/day: 0.10    Years: 35.00    Types: Cigarettes  . Smokeless tobacco: Never Used  . Alcohol use 0.6 oz/week    1 Glasses of wine per week     Allergies   Other and Penicillins   Review of Systems Review of Systems  Unable to perform ROS: Severe respiratory distress     Physical Exam Updated Vital Signs BP (!) 168/88  Pulse 87   Resp 20   Ht 5\' 2"  (1.575 m)   Wt 300 lb (136.1 kg)   SpO2 100%   BMI 54.87 kg/m   Physical Exam  Constitutional: She is oriented to person, place, and time. She appears well-developed and well-nourished. She appears distressed.  Dyspneic on conversation  HENT:  Head: Normocephalic and atraumatic.  Mouth/Throat: Oropharynx is clear and moist. No oropharyngeal exudate.  Eyes: Conjunctivae and EOM are normal. Pupils are equal, round, and reactive to light.  Neck: Normal range of motion. Neck supple.  No meningismus.  Cardiovascular: Normal rate, regular rhythm, normal heart sounds and intact distal pulses.   No murmur heard. Pulmonary/Chest: She is in respiratory distress. She has decreased breath sounds. She has wheezes (scattered).  Crackles noted  Abdominal: She exhibits distension. There is no tenderness. There is no rebound and no guarding.  Abdomen enlarged  Musculoskeletal: Normal range of motion. She exhibits edema. She exhibits no tenderness.  +2 pitting edema of bilateral lower extremities  Neurological: She is alert and oriented to person, place, and time. No cranial nerve deficit. She exhibits normal muscle tone. Coordination  normal.   5/5 strength throughout. CN 2-12 intact.Equal grip strength.   Skin: Skin is warm.  Psychiatric: She has a normal mood and affect. Her behavior is normal.  Nursing note and vitals reviewed.    ED Treatments / Results  DIAGNOSTIC STUDIES: Oxygen Saturation is 92% on NRB, low by my interpretation.    COORDINATION OF CARE:  12:42 AM Pt respiratory distress adequately relieved on BiPAP.  12:46 AM [place] does not show pericardial effusion.  12:48 AM Discussed treatment plan with pt at bedside and pt agreed to plan. Imaging and labs ordered. Will reassess.  Labs (all labs ordered are listed, but only abnormal results are displayed) Labs Reviewed  BLOOD GAS, ARTERIAL - Abnormal; Notable for the following:       Result Value   pH, Arterial 7.337 (*)    pCO2 arterial 60.3 (*)    pO2, Arterial 291 (*)    Bicarbonate 29.2 (*)    Acid-Base Excess 5.9 (*)    All other components within normal limits  CBC WITH DIFFERENTIAL/PLATELET - Abnormal; Notable for the following:    RBC 3.04 (*)    Hemoglobin 8.8 (*)    HCT 27.3 (*)    RDW 17.9 (*)    All other components within normal limits  COMPREHENSIVE METABOLIC PANEL - Abnormal; Notable for the following:    Glucose, Bld 130 (*)    BUN 21 (*)    Creatinine, Ser 1.39 (*)    Calcium 8.8 (*)    Albumin 2.8 (*)    Alkaline Phosphatase 197 (*)    GFR calc non Af Amer 40 (*)    GFR calc Af Amer 46 (*)    All other components within normal limits  BRAIN NATRIURETIC PEPTIDE - Abnormal; Notable for the following:    B Natriuretic Peptide 1,208.0 (*)    All other components within normal limits  URINALYSIS, ROUTINE W REFLEX MICROSCOPIC - Abnormal; Notable for the following:    Color, Urine STRAW (*)    All other components within normal limits  I-STAT CHEM 8, ED - Abnormal; Notable for the following:    Chloride 98 (*)    Creatinine, Ser 1.40 (*)    Glucose, Bld 131 (*)    Hemoglobin 9.5 (*)    HCT 28.0 (*)    All other components  within  normal limits  CULTURE, BLOOD (ROUTINE X 2)  CULTURE, BLOOD (ROUTINE X 2)  TROPONIN I  I-STAT CG4 LACTIC ACID, ED  I-STAT TROPOININ, ED    EKG  EKG Interpretation  Date/Time:  Sunday September 22, 2016 00:36:12 EDT Ventricular Rate:  92 PR Interval:    QRS Duration: 128 QT Interval:  475 QTC Calculation: 588 R Axis:   -40 Text Interpretation:  Normal sinus rhythm IVCD, consider atypical RBBB Inferior infarct, age indeterminate Anterolateral infarct, age indeterminate No significant change was found Confirmed by Manus Gunning  MD, Corrin Hingle [phone]) on 2016-09-22 1:18:57 AM       Radiology Dg Chest Portable 1 View  Result Date: 2016/09/22 CLINICAL DATA:  Shortness of breath EXAM: PORTABLE CHEST 1 VIEW COMPARISON:  Chest radiograph 06/13/2016 FINDINGS: Examination is degraded by poor penetration secondary to large body habitus. Cardiomegaly is unchanged. There are medial right lower lung opacities. IMPRESSION: 1. Examination severely limited by poor penetration secondary to body habitus. 2. Medial right lower lung opacities may indicate developing consolidation. Electronically Signed   By: Deatra Robinson M.D.   On: 22-Sep-2016 01:56    Procedures Procedures (including critical care time)  Medications Ordered in ED Medications  levofloxacin (LEVAQUIN) IVPB 500 mg (not administered)  furosemide (LASIX) injection 80 mg (80 mg Intravenous Given 2016/09/22 0050)  nitroGLYCERIN 50 mg in dextrose 5 % 250 mL (0.2 mg/mL) infusion (15 mcg/min Intravenous Rate/Dose Change 2016-09-22 0237)  ipratropium-albuterol (DUONEB) 0.5-2.5 (3) MG/3ML nebulizer solution 3 mL (3 mLs Nebulization Given 2016/09/22 0052)  methylPREDNISolone sodium succinate (SOLU-MEDROL) 125 mg/2 mL injection 125 mg (125 mg Intravenous Given September 22, 2016 0049)     Initial Impression / Assessment and Plan / ED Course  I have reviewed the triage vital signs and the nursing notes.  Pertinent labs & imaging results that were available during my  care of the patient were reviewed by me and considered in my medical decision making (see chart for details).     Patient arrives from home with respiratory distress. EMS reports as being ongoing for the past 2 weeks. She was hypoxic for EMS and placed on CPAP Which she did not tolerate and was switched to nonrebreather. Patient denies any chest pain. She states she's had a cough productive of clear mucus. No fever. Denies any history of COPD or asthma.  On exam she has crackles at the bases with decreased breath sounds. Large body habitus with edematous stomach and pitting edema to the thighs bilaterally.  Patient treated for both COPD exacerbation and CHF exacerbation. Given nebulizers, steroids and IV Lasix. Creatinine at baseline.  Placed on BiPAP on arrival which she is tolerating well.  Diuresing in ED.  Comfortable on bipap without hypoxia. Denies chest pain. EKG unchanged.  ICU admission d/w Dr. Onalee Hua.    CRITICAL CARE Performed by: Glynn Octave Total critical care time:45 minutes Critical care time was exclusive of separately billable procedures and treating other patients. Critical care was necessary to treat or prevent imminent or life-threatening deterioration. Critical care was time spent personally by me on the following activities: development of treatment plan with patient and/or surrogate as well as nursing, discussions with consultants, evaluation of patient's response to treatment, examination of patient, obtaining history from patient or surrogate, ordering and performing treatments and interventions, ordering and review of laboratory studies, ordering and review of radiographic studies, pulse oximetry and re-evaluation of patient's condition.     Final Clinical Impressions(s) / ED Diagnoses   Final diagnoses:  Acute respiratory failure  with hypoxia [facility])    New Prescriptions New Prescriptions   No medications on file    I personally performed the services  described in this documentation, which was scribed in my presence. The recorded information has been reviewed and is accurate.    Glynn Octave, MD 2016-09-06 0900

## 2016-08-25 NOTE — ED Notes (Signed)
Lab collected cultures.

## 2016-08-25 NOTE — H&P (Signed)
History and Physical    [name] ZOX:[id number] DOB: [birth year] DOA: 09/22/2016  PCP: [name], MD  Patient coming from:  home  Chief Complaint:  Sob and swelling  HPI: [name] is a 61 y.o. female with medical history significant of CHF, GERD, DM comes in with several days of progressive worsening swelling all over and sob.  She is on 2 liters oxygen at home, and had to increase this up to 5 liters the last day or so due to sob.  She denies any fevers, no cough.  She reports that about 3 weeks ago she decreased her lasix dosing from twice a day to once a day because it makes her "pee all night".  She was not swollen at all then.  About 4 days ago she started to swell more.  She does not check her weight at all as she does not have a scale at home to do so.  She denies chest pain.  Pt found to be grossly volume overloaded and in chf exacerbation in the ED.  Hypoxic also.  Treated for pna, copde and chf.  Referred for admission for treatment of her resp distress, she has been placed on bipap.   Review of Systems: As per HPI otherwise 10 point review of systems negative.   Past Medical History:  Diagnosis Date  . Anxiety   . Depression   . GERD (gastroesophageal reflux disease)   . High cholesterol   . Hypertension   . Type II diabetes mellitus (HCC)     Past Surgical History:  Procedure Laterality Date  . APPLICATION OF WOUND VAC Right 10/25/2015   Procedure: APPLICATION OF WOUND VAC;  Surgeon: Franky Macho, MD;  Location: [facility];  Service: General;  Laterality: Right;  . CESAREAN SECTION  1984  . INCISION AND DRAINAGE Right 08/09/2015   Procedure: INCISION AND DRAINAGE;  Surgeon: De Blanch Kinsinger, MD;  Location: [facility] OR;  Service: General;  Laterality: Right;  . INCISION AND DRAINAGE ABSCESS Right 08/08/2015   Procedure: Irrigation and Debridement of Right Lower Ext.;  Surgeon: Axel Filler, MD;  Location: [facility] OR;  Service: General;  Laterality: Right;    . INCISION AND DRAINAGE ABSCESS Right 10/25/2015   Procedure: INCISION AND DRAINAGE ABSCESS RIGHT AXILLARY ABSCESS;  Surgeon: Franky Macho, MD;  Location: [facility];  Service: General;  Laterality: Right;     reports that she has been smoking Cigarettes.  She has a 3.50 pack-year smoking history. She has never used smokeless tobacco. She reports that she drinks about 0.6 oz of alcohol per week . She reports that she does not use drugs.  Allergies  Allergen Reactions  . Other     Per pt some type of mycin "some antibiotic sent me into renal failure."  . Penicillins Rash    Tolerated Zosyn in 07/2015 Has patient had a PCN reaction causing immediate rash, facial/tongue/throat swelling, SOB or lightheadedness with hypotension: No Has patient had a PCN reaction causing severe rash involving mucus membranes or skin necrosis: No Has patient had a PCN reaction that required hospitalization No Has patient had a PCN reaction occurring within the last 10 years: No If all of the above answers are "NO", then may proceed with Cephalosporin use.     Family History  Problem Relation Age of Onset  . Hypertension Mother   . Diabetes Mother   . Kidney failure Father     Prior to Admission medications   Medication Sig Start Date  End Date Taking? Authorizing Provider  albuterol (PROAIR HFA) 108 (90 Base) MCG/ACT inhaler Inhale 2 puffs into the lungs every 6 (six) hours as needed for wheezing or shortness of breath.   Yes Historical Provider, MD  aspirin EC 325 MG tablet Take 325 mg by mouth daily.   Yes Historical Provider, MD  cetirizine (ZYRTEC) 10 MG tablet Take 10 mg by mouth daily.   Yes Historical Provider, MD  collagenase (SANTYL) ointment Apply in a nickel thickness to left achilles wound in the wound bed only, being careful not to get on surrounding skin.  Cover with dry gauze. 02/03/16  Yes Rushil Terrilee Croak, MD  doxycycline (VIBRA-TABS) 100 MG tablet Take 1 tablet (100 mg total) by mouth 2 (two)  times daily. 06/19/16  Yes Leroy Sea, MD  FLUoxetine (PROZAC) 20 MG capsule Take 20 mg by mouth 2 (two) times daily.  05/11/15  Yes Historical Provider, MD  hydrALAZINE (APRESOLINE) 50 MG tablet Take 1 tablet (50 mg total) by mouth 3 (three) times daily. 06/19/16  Yes Leroy Sea, MD  HYDROmorphone (DILAUDID) 4 MG tablet Take 4 mg by mouth 2 (two) times daily as needed. For pain 11/09/15  Yes Historical Provider, MD  insulin aspart (NOVOLOG FLEXPEN) 100 UNIT/ML FlexPen Inject 3 Units into the skin 3 (three) times daily with meals.   Yes Historical Provider, MD  insulin glargine (LANTUS) 100 UNIT/ML injection Inject 0.07 mLs (7 Units total) into the skin daily. 03/05/16  Yes Erick Blinks, MD  iron polysaccharides (NIFEREX) 150 MG capsule Take 1 capsule (150 mg total) by mouth 2 (two) times daily. 03/05/16  Yes Erick Blinks, MD  metoprolol tartrate (LOPRESSOR) 25 MG tablet Take 1 tablet (25 mg total) by mouth 2 (two) times daily. 09/21/15  Yes Leroy Sea, MD  Multiple Vitamin (MULTIVITAMIN WITH MINERALS) TABS tablet Take 1 tablet by mouth daily. 10/26/15  Yes Henderson Cloud, MD  mupirocin ointment (BACTROBAN) 2 % Apply topically 3 (three) times daily. 03/05/16  Yes Erick Blinks, MD  pantoprazole (PROTONIX) 40 MG tablet Take 1 tablet (40 mg total) by mouth daily. 02/03/16  Yes Rushil Terrilee Croak, MD  pravastatin (PRAVACHOL) 40 MG tablet Take 40 mg by mouth at bedtime.  05/11/15  Yes Historical Provider, MD  QUEtiapine (SEROQUEL) 25 MG tablet Take 1 tablet by mouth at bedtime.  12/22/15  Yes Historical Provider, MD  torsemide (DEMADEX) 20 MG tablet Take 1 tablet (20 mg total) by mouth 2 (two) times daily. 06/19/16  Yes Leroy Sea, MD    Physical Exam: Vitals:   09/02/16 0245 2016/09/02 0300 09-02-16 0505 09/02/2016 0522  BP: (!) 144/89 140/90    Pulse: 81 79  81  Resp:    12  Temp:   97.5 F (36.4 C)   TempSrc:   Axillary   SpO2: 99% 99%  99%  Weight:   131.2 kg (289 lb 3.9  oz)   Height:   5\' 3"  (1.6 m)     Constitutional: NAD, calm, comfortable Vitals:   09/02/16 0245 09/02/16 0300 02-Sep-2016 0505 02-Sep-2016 0522  BP: (!) 144/89 140/90    Pulse: 81 79  81  Resp:    12  Temp:   97.5 F (36.4 C)   TempSrc:   Axillary   SpO2: 99% 99%  99%  Weight:   131.2 kg (289 lb 3.9 oz)   Height:   5\' 3"  (1.6 m)    Eyes: PERRL, lids and conjunctivae normal  ENMT: Mucous membranes are moist. Posterior pharynx clear of any exudate or lesions.Normal dentition.  Neck: normal, supple, no masses, no thyromegaly Respiratory: clear to auscultation bilaterally, no wheezing, no crackles. Normal respiratory effort. No accessory muscle use. Dependent edema breasts also Cardiovascular: Regular rate and rhythm, no murmurs / rubs / gallops. 3-4+ ble edema up to thighs extremity edema. 2+ pedal pulses. No carotid bruits.  Abdomen: no tenderness, no masses palpated. No hepatosplenomegaly. Bowel sounds positive.  Pannus edema present Musculoskeletal: no clubbing / cyanosis. No joint deformity upper and lower extremities. Good ROM, no contractures. Normal muscle tone.  Skin: no rashes, lesions, ulcers. No induration Neurologic: CN 2-12 grossly intact. Sensation intact, DTR normal. Strength 5/5 in all 4.  Psychiatric: Normal judgment and insight. Alert and oriented x 3. Normal mood.    Labs on Admission: I have personally reviewed following labs and imaging studies  CBC:  Recent Labs Lab 09/07/2016 0035 09/03/2016 0057  WBC 7.0  --   NEUTROABS 4.5  --   HGB 8.8* 9.5*  HCT 27.3* 28.0*  MCV 89.8  --   PLT 295  --    Basic Metabolic Panel:  Recent Labs Lab 08/12/2016 0035 08/26/2016 0057  NA 142 145  K 3.7 3.6  CL 103 98*  CO2 32  --   GLUCOSE 130* 131*  BUN 21* 12  CREATININE 1.39* 1.40*  CALCIUM 8.8*  --    GFR: Estimated Creatinine Clearance: 55.9 mL/min (A) (by C-G formula based on SCr of 1.4 mg/dL (H)). Liver Function Tests:  Recent Labs Lab 09/07/2016 0035  AST 29   ALT 19  ALKPHOS 197*  BILITOT 0.8  PROT 7.9  ALBUMIN 2.8*   Cardiac Enzymes:  Recent Labs Lab 09/06/2016 0035  TROPONINI <0.03   Urine analysis:    Component Value Date/Time   COLORURINE STRAW (A) 08/10/2016 0115   APPEARANCEUR CLEAR 08/29/2016 0115   LABSPEC 1.005 08/20/2016 0115   PHURINE 5.0 08/13/2016 0115   GLUCOSEU NEGATIVE 08/14/2016 0115   HGBUR NEGATIVE 08/30/2016 0115   BILIRUBINUR NEGATIVE 08/24/2016 0115   KETONESUR NEGATIVE 08/14/2016 0115   PROTEINUR NEGATIVE 09/03/2016 0115   NITRITE NEGATIVE 09/06/2016 0115   LEUKOCYTESUR NEGATIVE 09/04/2016 0115    Recent Results (from the past 240 hour(s))  Blood culture (routine x 2)     Status: None (Preliminary result)   Collection Time: 08/19/2016  3:11 AM  Result Value Ref Range Status   Specimen Description BLOOD RIGHT HAND  Final   Special Requests BOTTLES DRAWN AEROBIC AND ANAEROBIC 6CC  Final   Culture PENDING  Incomplete   Report Status PENDING  Incomplete  Blood culture (routine x 2)     Status: None (Preliminary result)   Collection Time: 08/19/2016  3:20 AM  Result Value Ref Range Status   Specimen Description BLOOD RIGHT HAND  Final   Special Requests BOTTLES DRAWN AEROBIC AND ANAEROBIC 6CC  Final   Culture PENDING  Incomplete   Report Status PENDING  Incomplete     Radiological Exams on Admission: Dg Chest Portable 1 View  Result Date: 08/21/2016 CLINICAL DATA:  Shortness of breath EXAM: PORTABLE CHEST 1 VIEW COMPARISON:  Chest radiograph 06/13/2016 FINDINGS: Examination is degraded by poor penetration secondary to large body habitus. Cardiomegaly is unchanged. There are medial right lower lung opacities. IMPRESSION: 1. Examination severely limited by poor penetration secondary to body habitus. 2. Medial right lower lung opacities may indicate developing consolidation. Electronically Signed   By: Chrisandra NettersKevin  Herman M.D.  On: 08/17/2016 01:56    EKG: Independently reviewed. nsr , poor voltage RBBB cxr  reviewed poor quality, edema vs infiltrate Old chart reviewed Case discussed with edp  Assessment/Plan 61 yo female with acute on chronic respiratory hypoxic failure secondary to acute on chronic diastolic congestive heart failure  Principal Problem:   Acute congestive heart failure (HCC)- diastolic dysfunction due to some medical noncompliance with decreasing her diuretics about 2-3 weeks ago.  Pt needs chf education prior to d/c, may need some help with getting a weigh scale at home so she can monitor her weight.  Place on lasix 40mg  iv q 12 hours, given 80mg  iv in the ED.  Feels better on bipap. Cont bipap and wean as she tolerates as we diurese.  Will not repeat echo as one was recently done.  Serial trop.  Have explained the importance of taking her meds as instructed and monitoring her weight.  She understands.  Active Problems:   Acute respiratory failure with hypoxia (HCC)- due to chf.  Pt given levaquin and solumedrol in ED for possible copd and pna component.  Per history and physical exam pt is obviously volume overloaded, in the setting of normal wbc and no fever will hold off on any further antibiotics.  Will also hold off on any further steroids at this time and follow clinically how she responds to above chf treatment first.    Paroxysmal SVT (supraventricular tachycardia) (HCC)- noted , nsr at this time   IDDM (insulin dependent diabetes mellitus) (HCC)- SSI   Anemia of chronic disease- stable   CKD (chronic kidney disease), stage III- stable at baseline, monitor daily   h/o dialysis needs spring 2017- noted    DVT prophylaxis:  scds Code Status:  full Family Communication:  none  Disposition Plan:  Per day team Consults called:  none Admission status:   admission   Sherissa Tenenbaum A MD [facility]  If 7PM-7AM, please contact night-coverage [email or web address] Password [facility]  08/11/2016, 5:28 AM

## 2016-08-25 NOTE — Progress Notes (Signed)
02/22/17 12:38 AM  02/22/17 7:24 AM  [name] was seen and examined.  The H&P by the admitting provider , orders, imaging was reviewed.  Please see orders.  Will continue to follow.   [name]. Sophia Babington, MD [facility]

## 2016-08-25 NOTE — Progress Notes (Signed)
Contacted MD to advise that due to change in patient cardiac status, we ordered STAT EKG and Chest X-Ray to determine placement of PICC line. Patient stable and states she feels ok. 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 No changes from previous EKG in system. X-ray placement for PICC was determined and the Radiologist recommendation was pull PICC back 3cm and take another x-ray. Vascular Access was called to inform they would need to send the nurse back to [facility]. MD at bedside to assess patient, no further orders given.

## 2016-08-25 NOTE — ED Triage Notes (Signed)
Pt arrived by EMS SOB for the past 2 weeks.

## 2016-08-25 NOTE — Progress Notes (Signed)
Paged MD regarding lab draws. Due to edema, lab and nursing were unable to draw labs for patient. Also patient has limited IV access, 1 IV is currently saline locked right A/C. Patient stated she had a PICC during her last hospital stay due to limited access. MD ordered PICC placement. Vascular access contacted and scheduled.

## 2016-08-26 ENCOUNTER — Inpatient Hospital Stay (HOSPITAL_COMMUNITY): Payer: BLUE CROSS/BLUE SHIELD

## 2016-08-26 ENCOUNTER — Encounter (HOSPITAL_COMMUNITY): Payer: Self-pay | Admitting: Physician Assistant

## 2016-08-26 DIAGNOSIS — Z794 Long term (current) use of insulin: Secondary | ICD-10-CM

## 2016-08-26 DIAGNOSIS — E119 Type 2 diabetes mellitus without complications: Secondary | ICD-10-CM

## 2016-08-26 DIAGNOSIS — D638 Anemia in other chronic diseases classified elsewhere: Secondary | ICD-10-CM

## 2016-08-26 DIAGNOSIS — I5031 Acute diastolic (congestive) heart failure: Secondary | ICD-10-CM

## 2016-08-26 DIAGNOSIS — I319 Disease of pericardium, unspecified: Secondary | ICD-10-CM

## 2016-08-26 DIAGNOSIS — Z9119 Patient's noncompliance with other medical treatment and regimen: Secondary | ICD-10-CM

## 2016-08-26 DIAGNOSIS — I313 Pericardial effusion (noninflammatory): Secondary | ICD-10-CM

## 2016-08-26 DIAGNOSIS — I471 Supraventricular tachycardia: Secondary | ICD-10-CM

## 2016-08-26 DIAGNOSIS — I10 Essential (primary) hypertension: Secondary | ICD-10-CM

## 2016-08-26 DIAGNOSIS — I3139 Other pericardial effusion (noninflammatory): Secondary | ICD-10-CM

## 2016-08-26 DIAGNOSIS — N183 Chronic kidney disease, stage 3 (moderate): Secondary | ICD-10-CM

## 2016-08-26 DIAGNOSIS — Z91199 Patient's noncompliance with other medical treatment and regimen due to unspecified reason: Secondary | ICD-10-CM

## 2016-08-26 DIAGNOSIS — I5033 Acute on chronic diastolic (congestive) heart failure: Secondary | ICD-10-CM

## 2016-08-26 DIAGNOSIS — E8809 Other disorders of plasma-protein metabolism, not elsewhere classified: Secondary | ICD-10-CM

## 2016-08-26 LAB — CBC
HCT: 24.8 % — ABNORMAL LOW (ref 36.0–46.0)
Hemoglobin: 8 g/dL — ABNORMAL LOW (ref 12.0–15.0)
MCH: 29 pg (ref 26.0–34.0)
MCHC: 32.3 g/dL (ref 30.0–36.0)
MCV: 89.9 fL (ref 78.0–100.0)
Platelets: 270 10*3/uL (ref 150–400)
RBC: 2.76 MIL/uL — ABNORMAL LOW (ref 3.87–5.11)
RDW: 17.8 % — AB (ref 11.5–15.5)
WBC: 7 10*3/uL (ref 4.0–10.5)

## 2016-08-26 LAB — ECHOCARDIOGRAM LIMITED
FS: 35 % (ref 28–44)
HEIGHTINCHES: 63 in
IVS/LV PW RATIO, ED: 0.95
LA ID, A-P, ES: 36 mm
LA diam index: 1.45 cm/m2
LEFT ATRIUM END SYS DIAM: 36 mm
LV PW d: 15 mm — AB (ref 0.6–1.1)
WEIGHTICAEL: 4550.29 [oz_av]

## 2016-08-26 LAB — GLUCOSE, CAPILLARY
GLUCOSE-CAPILLARY: 132 mg/dL — AB (ref 65–99)
GLUCOSE-CAPILLARY: 148 mg/dL — AB (ref 65–99)
GLUCOSE-CAPILLARY: 154 mg/dL — AB (ref 65–99)

## 2016-08-26 LAB — BASIC METABOLIC PANEL
Anion gap: 6 (ref 5–15)
BUN: 22 mg/dL — AB (ref 6–20)
CHLORIDE: 106 mmol/L (ref 101–111)
CO2: 35 mmol/L — ABNORMAL HIGH (ref 22–32)
Calcium: 9 mg/dL (ref 8.9–10.3)
Creatinine, Ser: 1.55 mg/dL — ABNORMAL HIGH (ref 0.44–1.00)
GFR calc Af Amer: 41 mL/min — ABNORMAL LOW (ref >60)
GFR calc non Af Amer: 35 mL/min — ABNORMAL LOW (ref >60)
GLUCOSE: 142 mg/dL — AB (ref 65–99)
POTASSIUM: 3.5 mmol/L (ref 3.5–5.1)
Sodium: 147 mmol/L — ABNORMAL HIGH (ref 135–145)

## 2016-08-26 LAB — MAGNESIUM: Magnesium: 1.7 mg/dL (ref 1.7–2.4)

## 2016-08-26 MED ORDER — FUROSEMIDE 10 MG/ML IJ SOLN: 40.0000 mg | Freq: Every day | INTRAMUSCULAR | Status: DC

## 2016-08-26 MED ORDER — LORAZEPAM 0.5 MG PO TABS
0.5000 mg | ORAL_TABLET | Freq: Two times a day (BID) | ORAL | Status: DC | PRN
  Administered 2016-08-26 – 2016-08-29 (×5): 0.5 mg via ORAL
  Filled 2016-08-26 (×5): qty 1

## 2016-08-26 MED ORDER — CHLORHEXIDINE GLUCONATE CLOTH 2 % EX PADS
6.0000 | MEDICATED_PAD | Freq: Every day | CUTANEOUS | Status: DC
  Administered 2016-08-27 – 2016-08-29 (×3): 6 via TOPICAL

## 2016-08-26 MED ORDER — POTASSIUM CHLORIDE CRYS ER 20 MEQ PO TBCR
20.0000 meq | EXTENDED_RELEASE_TABLET | Freq: Once | ORAL | Status: AC
  Administered 2016-08-26: 20 meq via ORAL
  Filled 2016-08-26: qty 1

## 2016-08-26 MED ORDER — FUROSEMIDE 10 MG/ML IJ SOLN
40.0000 mg | Freq: Two times a day (BID) | INTRAMUSCULAR | Status: DC
  Administered 2016-08-26 – 2016-08-30 (×9): 40 mg via INTRAVENOUS
  Filled 2016-08-26 (×9): qty 4

## 2016-08-26 NOTE — Consult Note (Signed)
Cardiology Consultation Note    Patient ID: [name], MRN: [id number], DOB/AGE: [birth year] 61 y.o. Admit date: 08/10/2016   Date of Consult: 08/26/2016 Primary Physician: [name], MD Primary Cardiologist: Dr. Purvis Sheffield  Chief Complaint: Shortness of Breath Reason for Consultation: A/C Diastolic CHF Requesting MD: Dr. Laural Benes  HPI: [name] is a 61 y.o. female with history of morbid obesity, chronic respiratory failure on home O2 (pt states she does not know what for), PSVT noted in 2017 hospital admission briefly, IDDM, HTN, HLD, chronic-appearing anemia, CKD stage III with variable Cr in Epic, recurrent skin abscesses with prior sepsis, GERD, depression, anxiety, tobacco abuse, noncompliance who presented to [facility] with progressive dyspnea and swelling for 3-4 days. She saw Dr. Purvis Sheffield on 06/21/16 to establish care, had recently been admitted for acute on chronic diastolic CHF. At her OV 06/21/16 she had yet to pick up her diuretics and antihypertensives from the pharmacy. She continues to smoke. Dr. Purvis Sheffield had a long discussion with the patient about low sodium diet. She was advised to follow compliance with medications. Unfortunately she has continued to miss medications intermittently, namely her diuretic because she says it causes her to urinate too much - only taking 3x a week. She admits to drinking a lot of green tea with lemon as well as 2 bottles of water a day. She has attempted to follow some low-salt guidelines but in the form of lesser-sodium potato chips, and also admits to eating a gravy biscuit and hot dogs episodically. Discharge weight in January was 237lb. Her admission weight this time was 300lb - with diuretics, this has decreased to 284lb. She does not weigh at home so unclear chronicity of how fast this came on. She's noticed worsening sx for 3-4 days along with orthopnea. No CP, palpitations, syncope or bleeding. Telemetry shows NSR - has been  triggering for "bradycardia" but QRS complexes have just not been picking up periodically; actual HR 70s.  Workup thus far shows BNP 1208, Na 147, troponins neg, Hgb 8.8->8.0, Cr 1.39->1.55 so far. Abd [place] with small amt of left abdominal ascities, CXR with enlarged cardiac silhouette, worsening aeration of the lungs with bilateral lower lobe predominant interstitial and alveolar opacities suggestive of development of pulmonary edema. Last echo 06/14/16 with moderate LVH, EF 60-65%, grade 1 DD, mod RVE/RAE, mod increased PASP, moderate left pleural effusion and small circumferential pericardial effusion. She's been receiving IV Lasix which was backed down to once a day given rising Na of 147. IM also consulted GI given abdominal distention.  Past Medical History:  Diagnosis Date  . Anxiety   . Chronic anemia   . Chronic respiratory failure (HCC)   . CKD (chronic kidney disease), stage III   . Depression   . GERD (gastroesophageal reflux disease)   . High cholesterol   . History of noncompliance with medical treatment, presenting hazards to health   . Hypertension   . Morbid obesity (HCC)   . On home oxygen therapy   . PSVT (paroxysmal supraventricular tachycardia) (HCC)    a. mentioned in 2017 hospital note briefly.  . Skin abscess    Recurrent, prior h/o bacteremia and sepsis  . Type II diabetes mellitus (HCC)       Surgical History:  Past Surgical History:  Procedure Laterality Date  . APPLICATION OF WOUND VAC Right 10/25/2015   Procedure: APPLICATION OF WOUND VAC;  Surgeon: Franky Macho, MD;  Location: [facility];  Service: General;  Laterality: Right;  .  CESAREAN SECTION  1984  . INCISION AND DRAINAGE Right 08/09/2015   Procedure: INCISION AND DRAINAGE;  Surgeon: De BlanchLuke Aaron Kinsinger, MD;  [facility];  Service: General;  Laterality: Right;  . INCISION AND DRAINAGE ABSCESS Right 08/08/2015   Procedure: Irrigation and Debridement of Right Lower Ext.;  Surgeon: Axel FillerArmando Ramirez, MD;   Location: [facility] OR;  Service: General;  Laterality: Right;  . INCISION AND DRAINAGE ABSCESS Right 10/25/2015   Procedure: INCISION AND DRAINAGE ABSCESS RIGHT AXILLARY ABSCESS;  Surgeon: Franky MachoMark Jenkins, MD;  Location: [facility];  Service: General;  Laterality: Right;     Home Meds: Prior to Admission medications   Medication Sig Start Date End Date Taking? Authorizing Provider  albuterol (PROAIR HFA) 108 (90 Base) MCG/ACT inhaler Inhale 2 puffs into the lungs every 6 (six) hours as needed for wheezing or shortness of breath.   Yes Historical Provider, MD  aspirin EC 325 MG tablet Take 325 mg by mouth daily.   Yes Historical Provider, MD  cetirizine (ZYRTEC) 10 MG tablet Take 10 mg by mouth daily.   Yes Historical Provider, MD  collagenase (SANTYL) ointment Apply in a nickel thickness to left achilles wound in the wound bed only, being careful not to get on surrounding skin.  Cover with dry gauze. 02/03/16  Yes Rushil Terrilee CroakPatel V, MD  FLUoxetine (PROZAC) 20 MG capsule Take 20 mg by mouth 2 (two) times daily.  05/11/15  Yes Historical Provider, MD  hydrALAZINE (APRESOLINE) 50 MG tablet Take 1 tablet (50 mg total) by mouth 3 (three) times daily. 06/19/16  Yes Leroy SeaPrashant K Singh, MD  insulin aspart (NOVOLOG FLEXPEN) 100 UNIT/ML FlexPen Inject 3 Units into the skin 3 (three) times daily with meals.   Yes Historical Provider, MD  insulin glargine (LANTUS) 100 UNIT/ML injection Inject 0.07 mLs (7 Units total) into the skin daily. 03/05/16  Yes Erick BlinksJehanzeb Memon, MD  iron polysaccharides (NIFEREX) 150 MG capsule Take 1 capsule (150 mg total) by mouth 2 (two) times daily. 03/05/16  Yes Erick BlinksJehanzeb Memon, MD  metoprolol tartrate (LOPRESSOR) 25 MG tablet Take 1 tablet (25 mg total) by mouth 2 (two) times daily. 09/21/15  Yes Leroy SeaPrashant K Singh, MD  Multiple Vitamin (MULTIVITAMIN WITH MINERALS) TABS tablet Take 1 tablet by mouth daily. 10/26/15  Yes Henderson CloudEstela Y Hernandez Acosta, MD  mupirocin ointment (BACTROBAN) 2 % Apply topically 3 (three)  times daily. 03/05/16  Yes Erick BlinksJehanzeb Memon, MD  pantoprazole (PROTONIX) 40 MG tablet Take 1 tablet (40 mg total) by mouth daily. 02/03/16  Yes Rushil Terrilee CroakPatel V, MD  pravastatin (PRAVACHOL) 40 MG tablet Take 40 mg by mouth at bedtime.  05/11/15  Yes Historical Provider, MD  QUEtiapine (SEROQUEL) 25 MG tablet Take 1 tablet by mouth at bedtime.  12/22/15  Yes Historical Provider, MD  torsemide (DEMADEX) 20 MG tablet Take 1 tablet (20 mg total) by mouth 2 (two) times daily. 06/19/16  Yes Leroy SeaPrashant K Singh, MD  doxycycline (VIBRA-TABS) 100 MG tablet Take 1 tablet (100 mg total) by mouth 2 (two) times daily. 06/19/16   Leroy SeaPrashant K Singh, MD  HYDROmorphone (DILAUDID) 4 MG tablet Take 4 mg by mouth 2 (two) times daily as needed. For pain 11/09/15   Historical Provider, MD    Inpatient Medications:  . aspirin EC  325 mg Oral Daily  . FLUoxetine  20 mg Oral BID  . [START ON 08/27/2016] furosemide  40 mg Intravenous Daily  . hydrALAZINE  50 mg Oral TID  . insulin aspart  0-9  Units Subcutaneous TID WC  . mouth rinse  15 mL Mouth Rinse BID  . metoprolol tartrate  25 mg Oral BID  . potassium chloride  60 mEq Oral QHS  . sodium chloride flush  3 mL Intravenous Q12H     Allergies:  Allergies  Allergen Reactions  . Other     Per pt some type of mycin "some antibiotic sent me into renal failure."  . Penicillins Rash    Tolerated Zosyn in 07/2015 Has patient had a PCN reaction causing immediate rash, facial/tongue/throat swelling, SOB or lightheadedness with hypotension: No Has patient had a PCN reaction causing severe rash involving mucus membranes or skin necrosis: No Has patient had a PCN reaction that required hospitalization No Has patient had a PCN reaction occurring within the last 10 years: No If all of the above answers are "NO", then may proceed with Cephalosporin use.     Social History   Social History  . Marital status: Divorced    Spouse name: N/A  . Number of children: N/A  . Years of  education: N/A   Occupational History  . Not on file.   Social History Main Topics  . Smoking status: Current Every Day Smoker    Packs/day: 0.10    Years: 35.00    Types: Cigarettes  . Smokeless tobacco: Never Used  . Alcohol use No  . Drug use: No  . Sexual activity: Not Currently   Other Topics Concern  . Not on file   Social History Narrative  . No narrative on file     Family History  Problem Relation Age of Onset  . Hypertension Mother   . Diabetes Mother   . Heart disease Mother     pt unsure of details  . Kidney failure Father      Review of Systems: No fevers or chills. All other systems reviewed and are otherwise negative except as noted above.  Labs:  Recent Labs  09/05/2016 0035 08/12/2016 1315  TROPONINI <0.03 <0.03   Lab Results  Component Value Date   WBC 7.0 08/26/2016   HGB 8.0 (L) 08/26/2016   HCT 24.8 (L) 08/26/2016   MCV 89.9 08/26/2016   PLT 270 08/26/2016    Recent Labs Lab 08/15/2016 0035  08/26/16 0500  NA 142  < > 147*  K 3.7  < > 3.5  CL 103  < > 106  CO2 32  --  35*  BUN 21*  < > 22*  CREATININE 1.39*  < > 1.55*  CALCIUM 8.8*  --  9.0  PROT 7.9  --   --   BILITOT 0.8  --   --   ALKPHOS 197*  --   --   ALT 19  --   --   AST 29  --   --   GLUCOSE 130*  < > 142*  < > = values in this interval not displayed. Lab Results  Component Value Date   CHOL 127 08/08/2015   HDL 11 (L) 08/08/2015   LDLCALC 74 08/08/2015   TRIG 351 (H) 08/09/2015   Radiology/Studies:  Dg Chest 1 View  Result Date: 08/14/2016 CLINICAL DATA:  PICC line adjustment. EXAM: CHEST 1 VIEW COMPARISON:  09/03/2016 FINDINGS: Right-sided PICC line tip overlies the expected location of distal superior vena cava. Cardiomediastinal silhouette is enlarged. There is no evidence of pneumothorax. Worsening aeration of the lungs with bilateral lower lobe interstitial and alveolar opacities. Osseous structures are without acute abnormality. Soft  tissues are grossly  normal. IMPRESSION: Enlarged cardiac silhouette. Worsening aeration of the lungs with bilateral lower lobe predominant interstitial and alveolar opacities suggestive of development of pulmonary edema. PICC line tip overlies the expected location of distal superior vena cava. Electronically Signed   By: Ted Mcalpine M.D.   On: 09/07/2016 17:54   Dg Chest 1 View  Result Date: 08/21/2016 CLINICAL DATA:  PICC line placement. EXAM: CHEST 1 VIEW COMPARISON:  09/07/2016 and 06/13/2016 FINDINGS: Interval placement of right-sided PICC line as tip is somewhat difficult to visualize but appears in the region of the cavoatrial junction or right atrium. Lungs are hypoinflated with persistent opacification over the right midlung unchanged. Mild prominence of the perihilar markings. Stable cardiomegaly. Remainder of the exam is unchanged. IMPRESSION: Stable right midlung opacification suggesting atelectasis/scarring. Evidence of mild vascular congestion and stable cardiomegaly. Right-sided PICC line with tip difficult to visualize although appears at the level of the cavoatrial junction or right atrium. Recommend retracting approximately 3 cm and reimaging. These results were called by telephone at the time of interpretation on 08/14/2016 at 3:36 pm to patient's nurse, Huey Romans, who verbally acknowledged these results. Electronically Signed   By: Elberta Fortis M.D.   On: 08/13/2016 15:36   US Abdomen Limited  Result Date: 08/26/2016 CLINICAL DATA:  Ascites. EXAM: LIMITED ABDOMEN ULTRASOUND FOR ASCITES TECHNIQUE: Limited ultrasound survey for ascites was performed in all four abdominal quadrants. COMPARISON:  CT 02/27/2016 FINDINGS: Ultrasound performed in all 4 quadrants to assess for ascites. There is a small amount of ascites noted, best seen in the left upper and lower quadrants. IMPRESSION: Small amount of left abdominal ascites. This is not sufficient to tap. Electronically Signed   By: Charlett Nose M.D.    On: 08/26/2016 10:19   Dg Chest Portable 1 View  Result Date: 08/08/2016 CLINICAL DATA:  Shortness of breath EXAM: PORTABLE CHEST 1 VIEW COMPARISON:  Chest radiograph 06/13/2016 FINDINGS: Examination is degraded by poor penetration secondary to large body habitus. Cardiomegaly is unchanged. There are medial right lower lung opacities. IMPRESSION: 1. Examination severely limited by poor penetration secondary to body habitus. 2. Medial right lower lung opacities may indicate developing consolidation. Electronically Signed   By: Deatra Robinson M.D.   On: 08/30/2016 01:56    Wt Readings from Last 3 Encounters:  08/26/16 284 lb 6.3 oz (129 kg)  06/21/16 237 lb (107.5 kg)  06/19/16 237 lb 3.2 oz (107.6 kg)    EKG: NSR RBBB very low voltage throughout, possible prior inferior infarct - appears similar to 06/2016  Physical Exam: Blood pressure (!) 152/88, pulse 79, temperature 98 F (36.7 C), temperature source Axillary, resp. rate 18, height 5\' 3"  (1.6 m), weight 284 lb 6.3 oz (129 kg), SpO2 96 %. Body mass index is 50.38 kg/m. General: Well developed, well nourished morbidly obese chronically ill appearing AAF, in no acute distress. Head: Normocephalic, atraumatic, sclera non-icteric, no xanthomas, nares are without discharge.  Neck: Negative for carotid bruits. JVD mildly elevated. Lungs: Diminished BS diffusely, coarse, without overt wheezes, rales, or rhonchi. Breathing is unlabored. Heart: RRR with S1 S2. No murmurs, rubs, or gallops appreciated. Abdomen: Soft, non-tender, rounded/obese with normoactive bowel sounds. No hepatomegaly. No rebound/guarding. No obvious abdominal masses. Msk:  Strength and tone appear normal for age. Extremities: No clubbing or cyanosis. Trace bilateral LE edema, diffuse UE edema.  Distal pedal pulses are 2+ and equal bilaterally. Neuro: Alert and oriented X 3. No facial asymmetry. No focal deficit. Moves  all extremities spontaneously. Psych:  Responds to  questions appropriately with a normal affect.     Assessment and Plan  57F with morbid obesity, chronic respiratory failure on home O2 (pt states she does not know what for), PSVT noted in 2017 hospital admission briefly, IDDM, HTN, HLD, chronic-appearing anemia, CKD stage III with variable Cr in Epic, recurrent skin abscesses with prior sepsis, GERD, depression, anxiety, tobacco abuse, habitual noncompliance who presented to [facility] with worsening SOB/diffuse body edema, 65lb weight gain since 06/2016 concerning for a/c diastolic CHF.   1. Acute on chronic diastolic CHF - undoubtable contribution from noncompliance. She unfortunately has been eating high sodium foods, drinking excess fluid, and not taking her medication as prescribed. She remains 47lb above her discharge weight in January - unclear how much of this is fluid versus actual body weight but patient still reports diffuse edema. She seems to have responded well to Lasix thus far. Her dose today was decreased given concern for rising sodium level but we feel she would benefit from continued BID dosing for now with close f/u of her lytes in AM. Follow BP and adjust based on response. Consult dietitian for education reinforcement. Low albumin also likely contributing to volume retention.  2. Small pericardial effusion - EKG with continued low voltage similar to 06/2016 when this was small. Will get f/u limited echo to reassess given her enlarged cardiac silhouette on CXR.  3. CKD stage III - baseline Cr widely variable from the 1.1-2.7 range previously. May need to run a higher creatinine to maintain euvolemia. Follow. Avoid nephrotoxic agents.  4. Noncompliance - emphasized importance of low sodium diet and fluid restriction to the patient. She seems to have poor insight to her medical issues and how they relate to her actions outside the hospital.  5. Ongoing tobacco abuse - counseled on the ongoing detriment of this.  6. Acute on chronic  anemia - per IM. Consider decreasing ASA to 81mg  unless she's on higher dose for a different reason.  7. PSVT - quiescent, follow on telemetry.   Signed, Laurann Montana PA-C 08/26/2016, 11:41 AM Pager: [phone]   Attending note:  Patient seen and examined. Reviewed the chart and discussed the case with Ms. Shea Evans PA-C. Ms. Dercole presents with progressive shortness of breath, orthopnea, and diffuse body edema with potentially a 65 pound weight gain since January if scales are correct. She has a history of noncompliance in terms of sodium restriction and fluid excess, also not taking her diuretics regularly. Her last echocardiogram in January revealed LVEF 60-65% with grade 1 diastolic dysfunction, also small pericardial effusion.  On examination she is in no distress, morbidly obese appearing with increased abdominal girth. Lungs exhibit decreased breath sounds without wheezing, difficult to assess JVP, cardiac exam with distant regular heart sounds. Lab work shows creatinine 1.55, potassium 3.5, sodium 147, hemoglobin 8.0, platelets 270. Abdominal ultrasound shows a small amount of left-sided ascites. As x-ray consistent with interstitial edema and also increased cardiac silhouette. ECG with low voltage as noted previously.  Acute on chronic diastolic heart failure with significant volume overload and weight gain, complicated by noncompliance. Plan to continue IV Lasix, will increase back to 2 times a day for now. She had 2500 cc out more than in the last 24 hours. Would also follow-up limited echocardiogram to ensure no progression in pericardial effusion given increased cardiac silhouette and low voltage ECG. Follow renal function and urine output, also continue to adjust medications for blood pressure control.  Key will be determining a stable outpatient regimen and stressing compliance with both diet and medications.  Jonelle Sidle, M.D., F.A.C.C.

## 2016-08-26 NOTE — Progress Notes (Signed)
*  PRELIMINARY RESULTS* Echocardiogram Limited 2D Echocardiogram has been performed.  Stacey DrainWhite, Jaksen Fiorella J 08/26/2016, 2:48 PM

## 2016-08-26 NOTE — Progress Notes (Signed)
  Progress Note   Date: 08/26/2016  Patient Name: [name]        MRN#: [id number]   Clarification of the diagnosis of obesity:   morbid obesityMorbid Obesity (due to excessive calores and/or AHS )    Standley Dakinslanford Johnson, MD

## 2016-08-26 NOTE — Progress Notes (Signed)
PROGRESS NOTE    [name]  ZOX:[id number]  DOB: March 10, 1956  DOA: 08/26/2016 PCP: [name], MD  Hospital course:  [name] is a 61 y.o. female with medical history significant of CHF, GERD, DM comes in with several days of progressive worsening swelling all over and sob. Admitted to SDU for CHF exacerbation requiring bipap therapy.  She reports that about 3 weeks ago she decreased her lasix dosing from twice a day to once a day because it makes her "pee all night".   Assessment & Plan:   1. Acute respiratory failure with hypoxia - Improved with bipap and now weaning down to HFNC.   2. Acute exacerbation of chronic systolic heart failure - likely exacerbated by noncompliance with diuretic therapy.  Continue IV lasix for now, consult to cardiology service, monitor renal function and electrolytes closely.  Echo pending.  Check follow up CXR today.  3. Paroxysmal SVT - has been NSR.   4. Severe abdominal distension - concerning for ascites, will get US abdomen complete and GI consult, concerned about decompensated cirrhosis. 5. Hypernatremia - suspect some free water deficit from diuresis, reducing lasix to once daily.  Repeat in AM.   6. IDDM - treating with sliding scale coverage and monitor closely.  7. Anemia of chronic kidney disease - following closely, Hg down today.   8. CKD stage 3 - follow closely on diuretics, reducing IV lasix today to once daily.   DVT prophylaxis:  scds Code Status:  full Family Communication: bedside   Disposition Plan:  TBD  Consults called:  cardiology  Subjective: Pt without complaints.  Had some anxiety last night and given some lorazepam.    Objective: Vitals:   08/26/16 0345 08/26/16 0400 08/26/16 0500 08/26/16 0700  BP: 134/87 139/82 (!) 155/78 120/74  Pulse: 74 74 75 75  Resp: 15 11 19 18   Temp:  98 F (36.7 C)    TempSrc:  Axillary    SpO2: 95% 96% 98% 97%  Weight:   129 kg (284 lb 6.3 oz)   Height:         Intake/Output Summary (Last 24 hours) at 08/26/16 0718 Last data filed at 08/26/16 0659  Gross per 24 hour  Intake           228.63 ml  Output             2750 ml  Net         -2521.37 ml   Filed Weights   08/30/2016 0044 08/30/2016 0505 08/26/16 0500  Weight: 136.1 kg (300 lb) 131.2 kg (289 lb 3.9 oz) 129 kg (284 lb 6.3 oz)    Exam:  General exam: awake, alert, NAD.  Respiratory system. Crackles at bases.  No increased work of breathing. Cardiovascular system: S1 & S2 heard.   No JVD, murmurs, gallops, clicks or pedal edema. Gastrointestinal system: Abdomen is severely distended and tight,nontender. Normal bowel sounds heard. Central nervous system: Alert and oriented. No focal neurological deficits. Extremities: 2+ edema bilateral LEs and upper extremities.    Data Reviewed: Basic Metabolic Panel:  Recent Labs Lab 08/22/2016 0035 08/23/2016 0057 08/26/16 0500  NA 142 145 147*  K 3.7 3.6 3.5  CL 103 98* 106  CO2 32  --  35*  GLUCOSE 130* 131* 142*  BUN 21* 12 22*  CREATININE 1.39* 1.40* 1.55*  CALCIUM 8.8*  --  9.0  MG  --   --  1.7   Liver Function Tests:  Recent Labs Lab 08/26/2016 0035  AST 29  ALT 19  ALKPHOS 197*  BILITOT 0.8  PROT 7.9  ALBUMIN 2.8*   No results for input(s): LIPASE, AMYLASE in the last 168 hours. No results for input(s): AMMONIA in the last 168 hours. CBC:  Recent Labs Lab August 31, 2016 0035 08/10/2016 0057 08/26/16 0500  WBC 7.0  --  7.0  NEUTROABS 4.5  --   --   HGB 8.8* 9.5* 8.0*  HCT 27.3* 28.0* 24.8*  MCV 89.8  --  89.9  PLT 295  --  270   Cardiac Enzymes:  Recent Labs Lab August 31, 2016 0035 08/14/2016 1315  TROPONINI <0.03 <0.03   CBG (last 3)   Recent Labs  08/24/2016 0759 August 31, 2016 1253 08/11/2016 1717  GLUCAP 145* 147* 127*   Recent Results (from the past 240 hour(s))  Blood culture (routine x 2)     Status: None (Preliminary result)   Collection Time: Aug 31, 2016  3:11 AM  Result Value Ref Range Status   Specimen  Description BLOOD RIGHT HAND  Final   Special Requests BOTTLES DRAWN AEROBIC AND ANAEROBIC 6CC  Final   Culture PENDING  Incomplete   Report Status PENDING  Incomplete  Blood culture (routine x 2)     Status: None (Preliminary result)   Collection Time: 08/23/2016  3:20 AM  Result Value Ref Range Status   Specimen Description BLOOD RIGHT HAND  Final   Special Requests BOTTLES DRAWN AEROBIC AND ANAEROBIC 6CC  Final   Culture PENDING  Incomplete   Report Status PENDING  Incomplete  MRSA PCR Screening     Status: None   Collection Time: 08/09/2016  9:15 AM  Result Value Ref Range Status   MRSA by PCR NEGATIVE NEGATIVE Final    Comment:        The GeneXpert MRSA Assay (FDA approved for NASAL specimens only), is one component of a comprehensive MRSA colonization surveillance program. It is not intended to diagnose MRSA infection nor to guide or monitor treatment for MRSA infections.      Studies: Dg Chest 1 View  Result Date: 08/09/2016 CLINICAL DATA:  PICC line adjustment. EXAM: CHEST 1 VIEW COMPARISON:  08/28/2016 FINDINGS: Right-sided PICC line tip overlies the expected location of distal superior vena cava. Cardiomediastinal silhouette is enlarged. There is no evidence of pneumothorax. Worsening aeration of the lungs with bilateral lower lobe interstitial and alveolar opacities. Osseous structures are without acute abnormality. Soft tissues are grossly normal. IMPRESSION: Enlarged cardiac silhouette. Worsening aeration of the lungs with bilateral lower lobe predominant interstitial and alveolar opacities suggestive of development of pulmonary edema. PICC line tip overlies the expected location of distal superior vena cava. Electronically Signed   By: Ted Mcalpine M.D.   On: 31-Aug-2016 17:54   Dg Chest 1 View  Result Date: 08/10/2016 CLINICAL DATA:  PICC line placement. EXAM: CHEST 1 VIEW COMPARISON:  08/25/2016 and 06/13/2016 FINDINGS: Interval placement of right-sided PICC line  as tip is somewhat difficult to visualize but appears in the region of the cavoatrial junction or right atrium. Lungs are hypoinflated with persistent opacification over the right midlung unchanged. Mild prominence of the perihilar markings. Stable cardiomegaly. Remainder of the exam is unchanged. IMPRESSION: Stable right midlung opacification suggesting atelectasis/scarring. Evidence of mild vascular congestion and stable cardiomegaly. Right-sided PICC line with tip difficult to visualize although appears at the level of the cavoatrial junction or right atrium. Recommend retracting approximately 3 cm and reimaging. These results were called by telephone at the  time of interpretation on December 20, 2016 at 3:36 pm to patient's nurse, Huey RomansNikki Devorak, who verbally acknowledged these results. Electronically Signed   By: Elberta Fortisaniel  Boyle M.D.   On: 0July 13, 2018 15:36   Dg Chest Portable 1 View  Result Date: December 20, 2016 CLINICAL DATA:  Shortness of breath EXAM: PORTABLE CHEST 1 VIEW COMPARISON:  Chest radiograph 06/13/2016 FINDINGS: Examination is degraded by poor penetration secondary to large body habitus. Cardiomegaly is unchanged. There are medial right lower lung opacities. IMPRESSION: 1. Examination severely limited by poor penetration secondary to body habitus. 2. Medial right lower lung opacities may indicate developing consolidation. Electronically Signed   By: Deatra RobinsonKevin  Herman M.D.   On: 0July 13, 2018 01:56   Scheduled Meds: . aspirin EC  325 mg Oral Daily  . FLUoxetine  20 mg Oral BID  . [START ON 08/27/2016] furosemide  40 mg Intravenous Daily  . hydrALAZINE  50 mg Oral TID  . insulin aspart  0-9 Units Subcutaneous TID WC  . mouth rinse  15 mL Mouth Rinse BID  . metoprolol tartrate  25 mg Oral BID  . potassium chloride  60 mEq Oral QHS  . sodium chloride flush  3 mL Intravenous Q12H   Continuous Infusions:  Principal Problem:   Acute congestive heart failure (HCC) Active Problems:   IDDM (insulin dependent  diabetes mellitus) (HCC)   Acute respiratory failure with hypoxia (HCC)   Anemia of chronic disease   CKD (chronic kidney disease), stage III   h/o dialysis needs spring 2017   Paroxysmal SVT (supraventricular tachycardia) [place](HCC)  Critical Care Time spent: 47 mins  Standley Dakinslanford Marvelous Bouwens, MD, FAAFP [facility] Pager [phone]  If 7PM-7AM, please contact night-coverage [email or web address] Password [email or web address] 08/26/2016, 7:18 AM    LOS: 1 day

## 2016-08-27 LAB — BASIC METABOLIC PANEL
ANION GAP: 5 (ref 5–15)
BUN: 26 mg/dL — ABNORMAL HIGH (ref 6–20)
CHLORIDE: 105 mmol/L (ref 101–111)
CO2: 34 mmol/L — AB (ref 22–32)
Calcium: 8.8 mg/dL — ABNORMAL LOW (ref 8.9–10.3)
Creatinine, Ser: 1.6 mg/dL — ABNORMAL HIGH (ref 0.44–1.00)
GFR calc Af Amer: 39 mL/min — ABNORMAL LOW (ref >60)
GFR calc non Af Amer: 34 mL/min — ABNORMAL LOW (ref >60)
GLUCOSE: 101 mg/dL — AB (ref 65–99)
POTASSIUM: 3.9 mmol/L (ref 3.5–5.1)
Sodium: 144 mmol/L (ref 135–145)

## 2016-08-27 LAB — CBC
HEMATOCRIT: 23.8 % — AB (ref 36.0–46.0)
HEMOGLOBIN: 7.8 g/dL — AB (ref 12.0–15.0)
MCH: 29.3 pg (ref 26.0–34.0)
MCHC: 32.8 g/dL (ref 30.0–36.0)
MCV: 89.5 fL (ref 78.0–100.0)
Platelets: 227 10*3/uL (ref 150–400)
RBC: 2.66 MIL/uL — ABNORMAL LOW (ref 3.87–5.11)
RDW: 17.6 % — ABNORMAL HIGH (ref 11.5–15.5)
WBC: 5.8 10*3/uL (ref 4.0–10.5)

## 2016-08-27 LAB — GLUCOSE, CAPILLARY
GLUCOSE-CAPILLARY: 93 mg/dL (ref 65–99)
Glucose-Capillary: 127 mg/dL — ABNORMAL HIGH (ref 65–99)
Glucose-Capillary: 120 mg/dL — ABNORMAL HIGH (ref 65–99)
Glucose-Capillary: 101 mg/dL — ABNORMAL HIGH (ref 65–99)

## 2016-08-27 LAB — MAGNESIUM: Magnesium: 1.6 mg/dL — ABNORMAL LOW (ref 1.7–2.4)

## 2016-08-27 MED ORDER — SENNOSIDES-DOCUSATE SODIUM 8.6-50 MG PO TABS
1.0000 | ORAL_TABLET | Freq: Two times a day (BID) | ORAL | Status: AC
  Administered 2016-08-27 – 2016-08-28 (×3): 1 via ORAL
  Filled 2016-08-27 (×4): qty 1

## 2016-08-27 MED ORDER — SENNOSIDES-DOCUSATE SODIUM 8.6-50 MG PO TABS: 1.0000 | ORAL_TABLET | Freq: Two times a day (BID) | ORAL | Status: DC

## 2016-08-27 NOTE — Progress Notes (Signed)
PROGRESS NOTE    Sophia EllisonGenevieve A [name]  EXB:[id number]  DOB: 10/28/1955  DOA: 2017/03/24 PCP: Louie BostonAPPER,[name], MD  Hospital course:  [name] is a 61 y.o. female with medical history significant of CHF, GERD, DM comes in with several days of progressive worsening swelling all over and sob. Admitted to SDU for CHF exacerbation requiring bipap therapy.  She reports that about 3 weeks ago she decreased her lasix dosing from twice a day to once a day because it makes her "pee all night".   Assessment & Plan:   1. Acute respiratory failure with hypoxia - Improved with bipap and now weaning down to nasal cannula.     2. Acute exacerbation of chronic systolic heart failure - likely exacerbated by noncompliance with diuretic therapy and dietary excursions.  Continue IV lasix per cardiology team. Pt is down 18 pounds.  She is still very volume overloaded.  Continue to monitor renal function and electrolytes closely.  Echo results pending.    3. Paroxysmal SVT - has been NSR.   4. Severe abdominal distension - US negative for ascites, likely related to CHF volume overload.  Only minimal improvement thus far.  5. Hypernatremia - repeat test within normal limits, continue diuresis.  6. Constipation - add stool softeners to daily regimen.     7. IDDM - treating with sliding scale coverage and monitor closely. BS well controlled.  8. Anemia of chronic kidney disease - following closely, Hg around 8.    9. CKD stage 3 - follow closely on diuretics, Holding stable.   DVT prophylaxis:  scds Code Status:  full Family Communication: bedside   Disposition Plan:  TBD  Consults called:  cardiology  Subjective: Pt reports constipation, requesting stool softener.      Objective: Vitals:   08/27/16 0300 08/27/16 0400 08/27/16 0500 08/27/16 0600  BP: (!) 162/77 (!) 165/82 (!) 142/83 (!) 151/112  Pulse: 75 76 75 80  Resp: 12 12 12  (!) 23  Temp:  98.3 F (36.8 C)    TempSrc:  Oral    SpO2:  97% 96% 96% 92%  Weight:   128.2 kg (282 lb 10.1 oz)   Height:        Intake/Output Summary (Last 24 hours) at 08/27/16 0726 Last data filed at 08/27/16 0500  Gross per 24 hour  Intake              630 ml  Output              901 ml  Net             -271 ml   Filed Weights   Oct 18, 2016 0505 08/26/16 0500 08/27/16 0500  Weight: 131.2 kg (289 lb 3.9 oz) 129 kg (284 lb 6.3 oz) 128.2 kg (282 lb 10.1 oz)    Exam:  General exam: awake, alert, NAD.  Respiratory system. Fine crackles at bases.  No increased work of breathing. Cardiovascular system: S1 & S2 heard.    Gastrointestinal system: Abdomen is severely distended and tight,nontender. Normal bowel sounds heard. Central nervous system: Alert and oriented. No focal neurological deficits. Extremities: 1+ edema bilateral LEs and upper extremities.  TED Hoses on.  UE edema not much changed from 3/19.  Data Reviewed: Basic Metabolic Panel:  Recent Labs Lab Oct 18, 2016 0035 Oct 18, 2016 0057 08/26/16 0500 08/27/16 0409  NA 142 145 147* 144  K 3.7 3.6 3.5 3.9  CL 103 98* 106 105  CO2 32  --  35*  34*  GLUCOSE 130* 131* 142* 101*  BUN 21* 12 22* 26*  CREATININE 1.39* 1.40* 1.55* 1.60*  CALCIUM 8.8*  --  9.0 8.8*  MG  --   --  1.7 1.6*   Liver Function Tests:  Recent Labs Lab 08/24/2016 0035  AST 29  ALT 19  ALKPHOS 197*  BILITOT 0.8  PROT 7.9  ALBUMIN 2.8*   No results for input(s): LIPASE, AMYLASE in the last 168 hours. No results for input(s): AMMONIA in the last 168 hours. CBC:  Recent Labs Lab 08/24/2016 0035 08/24/2016 0057 08/26/16 0500 08/27/16 0409  WBC 7.0  --  7.0 5.8  NEUTROABS 4.5  --   --   --   HGB 8.8* 9.5* 8.0* 7.8*  HCT 27.3* 28.0* 24.8* 23.8*  MCV 89.8  --  89.9 89.5  PLT 295  --  270 227   Cardiac Enzymes:  Recent Labs Lab 08/19/2016 0035 08/10/2016 1315  TROPONINI <0.03 <0.03   CBG (last 3)   Recent Labs  08/26/16 0915 08/26/16 1213 08/26/16 1537  GLUCAP 132* 148* 154*   Recent Results  (from the past 240 hour(s))  Blood culture (routine x 2)     Status: None (Preliminary result)   Collection Time: 08/24/2016  3:11 AM  Result Value Ref Range Status   Specimen Description BLOOD RIGHT HAND  Final   Special Requests BOTTLES DRAWN AEROBIC AND ANAEROBIC 6CC  Final   Culture NO GROWTH 2 DAYS  Final   Report Status PENDING  Incomplete  Blood culture (routine x 2)     Status: None (Preliminary result)   Collection Time: 08/19/2016  3:20 AM  Result Value Ref Range Status   Specimen Description BLOOD RIGHT HAND  Final   Special Requests BOTTLES DRAWN AEROBIC AND ANAEROBIC 6CC  Final   Culture NO GROWTH 2 DAYS  Final   Report Status PENDING  Incomplete  MRSA PCR Screening     Status: None   Collection Time: 08/27/2016  9:15 AM  Result Value Ref Range Status   MRSA by PCR NEGATIVE NEGATIVE Final    Comment:        The GeneXpert MRSA Assay (FDA approved for NASAL specimens only), is one component of a comprehensive MRSA colonization surveillance program. It is not intended to diagnose MRSA infection nor to guide or monitor treatment for MRSA infections.      Studies: Dg Chest 1 View  Result Date: 08/19/2016 CLINICAL DATA:  PICC line adjustment. EXAM: CHEST 1 VIEW COMPARISON:  09/06/2016 FINDINGS: Right-sided PICC line tip overlies the expected location of distal superior vena cava. Cardiomediastinal silhouette is enlarged. There is no evidence of pneumothorax. Worsening aeration of the lungs with bilateral lower lobe interstitial and alveolar opacities. Osseous structures are without acute abnormality. Soft tissues are grossly normal. IMPRESSION: Enlarged cardiac silhouette. Worsening aeration of the lungs with bilateral lower lobe predominant interstitial and alveolar opacities suggestive of development of pulmonary edema. PICC line tip overlies the expected location of distal superior vena cava. Electronically Signed   By: Ted Mcalpine M.D.   On: 08/27/2016 17:54   Dg  Chest 1 View  Result Date: 08/16/2016 CLINICAL DATA:  PICC line placement. EXAM: CHEST 1 VIEW COMPARISON:  08/24/2016 and 06/13/2016 FINDINGS: Interval placement of right-sided PICC line as tip is somewhat difficult to visualize but appears in the region of the cavoatrial junction or right atrium. Lungs are hypoinflated with persistent opacification over the right midlung unchanged. Mild prominence of the perihilar  markings. Stable cardiomegaly. Remainder of the exam is unchanged. IMPRESSION: Stable right midlung opacification suggesting atelectasis/scarring. Evidence of mild vascular congestion and stable cardiomegaly. Right-sided PICC line with tip difficult to visualize although appears at the level of the cavoatrial junction or right atrium. Recommend retracting approximately 3 cm and reimaging. These results were called by telephone at the time of interpretation on 08/18/2016 at 3:36 pm to patient's nurse, Huey Romans, who verbally acknowledged these results. Electronically Signed   By: Elberta Fortis M.D.   On: 08/30/2016 15:36   US Abdomen Limited  Result Date: 08/26/2016 CLINICAL DATA:  Ascites. EXAM: LIMITED ABDOMEN ULTRASOUND FOR ASCITES TECHNIQUE: Limited ultrasound survey for ascites was performed in all four abdominal quadrants. COMPARISON:  CT 02/27/2016 FINDINGS: Ultrasound performed in all 4 quadrants to assess for ascites. There is a small amount of ascites noted, best seen in the left upper and lower quadrants. IMPRESSION: Small amount of left abdominal ascites. This is not sufficient to tap. Electronically Signed   By: Charlett Nose M.D.   On: 08/26/2016 10:19   Scheduled Meds: . aspirin EC  325 mg Oral Daily  . Chlorhexidine Gluconate Cloth  6 each Topical Q0600  . FLUoxetine  20 mg Oral BID  . furosemide  40 mg Intravenous BID  . hydrALAZINE  50 mg Oral TID  . insulin aspart  0-9 Units Subcutaneous TID WC  . mouth rinse  15 mL Mouth Rinse BID  . metoprolol tartrate  25 mg Oral BID   . potassium chloride  60 mEq Oral QHS  . sodium chloride flush  3 mL Intravenous Q12H   Continuous Infusions:  Principal Problem:   Acute on chronic diastolic heart failure (HCC) Active Problems:   IDDM (insulin dependent diabetes mellitus) (HCC)   Essential hypertension   Acute respiratory failure with hypoxia (HCC)   Anemia of chronic disease   CKD (chronic kidney disease), stage III   h/o dialysis needs spring 2017   Paroxysmal SVT (supraventricular tachycardia) (HCC)   History of noncompliance with medical treatment, presenting hazards to health   Pericardial effusion   Hypoalbuminemia  Critical Care Time spent: 42 mins  Standley Dakins, MD, FAAFP [facility] Pager [phone]  If 7PM-7AM, please contact night-coverage [email or web address] Password [email or web address] 08/27/2016, 7:26 AM    LOS: 2 days

## 2016-08-27 NOTE — Plan of Care (Addendum)
Problem: Food- and Nutrition-Related Knowledge Deficit (NB-1.1) Goal: Nutrition education Formal process to instruct or train a patient/client in a skill or to impart knowledge to help patients/clients voluntarily manage or modify food choices and eating behavior to maintain or improve health.  Outcome: [facility] Nutrition Consult to provide diet education CHF received. RD returned later this afternoon. The patient is awake now and ready to partciapte.  RD covered basics of "Eating Plan for Heart Failure" handout. Reviewed patient's dietary recall. Provided examples on ways to decrease sodium intake in diet.   Discouraged intake of processed foods and use of salt shaker. She regularly eating fast food and breakfast meats such as bacon and sausage.    Encouraged fresh fruits and vegetables as well as whole grain sources of carbohydrates to maximize fiber intake.   RD discussed why it is important for patient to adhere to diet recommendations, and emphasized the role of fluids, foods to avoid, and importance of weighing self daily. Teach back method used.  Expect fair compliance. The patient says, " I heard all this before, I just need to do it."  Body mass index is 50.07 kg/m. Pt meets criteria for obesity class III based on current BMI.  No further nutrition interventions warranted at this time. RD contact information provided. If additional nutrition issues arise, please re-consult RD.    Colman Cater MS,RD,CSG,LDN Office: [phone] Pager: [phone]

## 2016-08-27 NOTE — Progress Notes (Signed)
Progress Note  Patient Name: [name] Date of Encounter: 08/27/2016  Primary Cardiologist: Dr. Purvis SheffieldKoneswaran  Subjective   Feeling a little better. Belly still significantly distended but she reports it's better than it was. No chest pain. No dyspnea at rest. Eager for breakfast.  Inpatient Medications    Scheduled Meds: . aspirin EC  325 mg Oral Daily  . Chlorhexidine Gluconate Cloth  6 each Topical Q0600  . FLUoxetine  20 mg Oral BID  . furosemide  40 mg Intravenous BID  . hydrALAZINE  50 mg Oral TID  . insulin aspart  0-9 Units Subcutaneous TID WC  . mouth rinse  15 mL Mouth Rinse BID  . metoprolol tartrate  25 mg Oral BID  . potassium chloride  60 mEq Oral QHS  . senna-docusate  1 tablet Oral BID  . sodium chloride flush  3 mL Intravenous Q12H    PRN Meds: sodium chloride, acetaminophen, albuterol, hydrALAZINE, LORazepam, ondansetron (ZOFRAN) IV, sodium chloride flush   Vital Signs    Vitals:   08/27/16 0400 08/27/16 0500 08/27/16 0600 08/27/16 0739  BP: (!) 165/82 (!) 142/83 (!) 151/112   Pulse: 76 75 80 80  Resp: 12 12 (!) 23 17  Temp: 98.3 F (36.8 C)   98.6 F (37 C)  TempSrc: Oral   Oral  SpO2: 96% 96% 92% 91%  Weight:  282 lb 10.1 oz (128.2 kg)    Height:        Intake/Output Summary (Last 24 hours) at 08/27/16 0805 Last data filed at 08/27/16 0500  Gross per 24 hour  Intake              390 ml  Output              901 ml  Net             -511 ml   Filed Weights   08/12/2016 0505 08/26/16 0500 08/27/16 0500  Weight: 289 lb 3.9 oz (131.2 kg) 284 lb 6.3 oz (129 kg) 282 lb 10.1 oz (128.2 kg)    Telemetry    NSR with artifact - Personally Reviewed  Physical Exam   GEN: Well developed, well nourished morbidly obese chronically ill appearing AAF, in no acute distress. HEENT: Normocephalic, atraumatic, sclera non-icteric. Neck: No JVD or bruits. Cardiac: RRR no murmurs, rubs, or gallops.  Radials/DP/PT 1+ and equal bilaterally.    Respiratory: Diminished BS throughout. Breathing is unlabored. GI: Moderately distended and tight, BS +x 4. MS: no deformity. Extremities: No clubbing or cyanosis. UE edema of fingers noted. Distal pedal pulses are 2+ and equal bilaterally. Neuro:  AAOx3. Follows commands. Psych:  Responds to questions appropriately with a normal affect.  Labs    Chemistry Recent Labs Lab 08/16/2016 0035 08/19/2016 0057 08/26/16 0500 08/27/16 0409  NA 142 145 147* 144  K 3.7 3.6 3.5 3.9  CL 103 98* 106 105  CO2 32  --  35* 34*  GLUCOSE 130* 131* 142* 101*  BUN 21* 12 22* 26*  CREATININE 1.39* 1.40* 1.55* 1.60*  CALCIUM 8.8*  --  9.0 8.8*  PROT 7.9  --   --   --   ALBUMIN 2.8*  --   --   --   AST 29  --   --   --   ALT 19  --   --   --   ALKPHOS 197*  --   --   --   BILITOT 0.8  --   --   --  GFRNONAA 40*  --  35* 34*  GFRAA 46*  --  41* 39*  ANIONGAP 7  --  6 5     Hematology Recent Labs Lab 08-28-16 0035 08-28-2016 0057 08/26/16 0500 08/27/16 0409  WBC 7.0  --  7.0 5.8  RBC 3.04*  --  2.76* 2.66*  HGB 8.8* 9.5* 8.0* 7.8*  HCT 27.3* 28.0* 24.8* 23.8*  MCV 89.8  --  89.9 89.5  MCH 28.9  --  29.0 29.3  MCHC 32.2  --  32.3 32.8  RDW 17.9*  --  17.8* 17.6*  PLT 295  --  270 227    Cardiac Enzymes Recent Labs Lab 2016-08-28 0035 08-28-16 1315  TROPONINI <0.03 <0.03    Recent Labs Lab Aug 28, 2016 0054  TROPIPOC 0.00     BNP Recent Labs Lab August 28, 2016 0035  BNP 1,208.0*     Radiology    Dg Chest 1 View  Result Date: August 28, 2016 CLINICAL DATA:  PICC line adjustment. EXAM: CHEST 1 VIEW COMPARISON:  08/28/16 FINDINGS: Right-sided PICC line tip overlies the expected location of distal superior vena cava. Cardiomediastinal silhouette is enlarged. There is no evidence of pneumothorax. Worsening aeration of the lungs with bilateral lower lobe interstitial and alveolar opacities. Osseous structures are without acute abnormality. Soft tissues are grossly normal. IMPRESSION:  Enlarged cardiac silhouette. Worsening aeration of the lungs with bilateral lower lobe predominant interstitial and alveolar opacities suggestive of development of pulmonary edema. PICC line tip overlies the expected location of distal superior vena cava. Electronically Signed   By: Ted Mcalpine M.D.   On: 08/28/16 17:54   Dg Chest 1 View  Result Date: 2016-08-28 CLINICAL DATA:  PICC line placement. EXAM: CHEST 1 VIEW COMPARISON:  08-28-2016 and 06/13/2016 FINDINGS: Interval placement of right-sided PICC line as tip is somewhat difficult to visualize but appears in the region of the cavoatrial junction or right atrium. Lungs are hypoinflated with persistent opacification over the right midlung unchanged. Mild prominence of the perihilar markings. Stable cardiomegaly. Remainder of the exam is unchanged. IMPRESSION: Stable right midlung opacification suggesting atelectasis/scarring. Evidence of mild vascular congestion and stable cardiomegaly. Right-sided PICC line with tip difficult to visualize although appears at the level of the cavoatrial junction or right atrium. Recommend retracting approximately 3 cm and reimaging. These results were called by telephone at the time of interpretation on 28-Aug-2016 at 3:36 pm to patient's nurse, Huey Romans, who verbally acknowledged these results. Electronically Signed   By: Elberta Fortis M.D.   On: August 28, 2016 15:36   US Abdomen Limited  Result Date: 08/26/2016 CLINICAL DATA:  Ascites. EXAM: LIMITED ABDOMEN ULTRASOUND FOR ASCITES TECHNIQUE: Limited ultrasound survey for ascites was performed in all four abdominal quadrants. COMPARISON:  CT 02/27/2016 FINDINGS: Ultrasound performed in all 4 quadrants to assess for ascites. There is a small amount of ascites noted, best seen in the left upper and lower quadrants. IMPRESSION: Small amount of left abdominal ascites. This is not sufficient to tap. Electronically Signed   By: Charlett Nose M.D.   On: 08/26/2016 10:19     Cardiac Studies   2D echo limited 08/26/16 Study Conclusions - Left ventricle: The cavity size was normal. Wall thickness was   increased in a pattern of moderate LVH. Systolic function was   normal. The estimated ejection fraction was in the range of 60%   to 65%. Wall motion was normal; there were no regional wall   motion abnormalities. - Aortic valve: Mildly calcified annulus. Mildly thickened  leaflets. - Mitral valve: Mildly calcified annulus. Mildly thickened leaflets  - Right ventricle: The cavity size was moderately dilated. - Right atrium: The atrium was moderately dilated. - Atrial septum: The septum bowed from right to left, consistent   with increased right atrial pressure. - Pericardium, extracardiac: A trivial to small circumferential   pericardial effusion was identified.  Patient Profile     55F with morbid obesity, chronic respiratory failure on home O2 (pt states she does not know what for), PSVT noted in 2017 hospital admission briefly, IDDM, HTN, HLD, chronic-appearing anemia, CKD stage III with variable Cr in Epic, recurrent skin abscesses with prior sepsis, GERD, depression, anxiety, tobacco abuse, habitual noncompliance who presented to [facility] with worsening SOB/diffuse body edema, 65lb weight gain since 06/2016 concerning for a/c diastolic CHF. Has been eating high sodium foods, drinking excess fluid, and not taking her medication as prescribed. Prior DC weight 237lb, admitted at 300lb.  Assessment & Plan    1. Acute on chronic diastolic CHF - still with significant volume on board. Down another 2lb. I told her I thought she was going to be here for several more days. Cr remains relatively stable compared to yesterday, need to follow closely. Will discuss consideration of metolazone with MD to augment diuresis. Consult dietitian for education reinforcement. Low albumin also likely contributing to volume retention.  2. Small pericardial effusion - trivial to  small by echo yesterday, follow clinically.  3. CKD stage III - baseline Cr widely variable from the 1.1-2.7 range previously. May need to run a higher creatinine to maintain euvolemia. Follow. Avoid nephrotoxic agents.  4. Noncompliance - emphasized importance of low sodium diet and fluid restriction to the patient. She seems to have poor insight to her medical issues and how they relate to her actions outside the hospital.  5. Ongoing tobacco abuse - counseled on the ongoing detriment of this.  6. Acute on chronic anemia - per IM. Consider decreasing ASA to 81mg  unless she's on higher dose for a different reason.  7. PSVT - quiescent, follow on telemetry.  8. HTN - will review with MD. If no adjustment to diuretic is made today, consider further titration of her hydralazine.  Signed, Laurann Montana, PA-C  08/27/2016, 8:05 AM     Attending note:  Patient seen and examined. Agree with above assessment by Ms. Dunn PA-C. [name] feels somewhat better today but still remains significantly volume overloaded. She diuresed approximately 2500 cc last 24 hours and weight is down a few pounds. Still not near baseline. Lungs exhibit diminished breath sounds, abdomen remains distended but less tender. Creatinine 1.6 today compared to 1.55, hemoglobin 7.8 down from 8.0. Follow-up echocardiogram shows LVEF 60-65% with a trivial to small pericardial effusion. Plan to continue current dose of IV Lasix with good diuresis. Can reconsider dose adjustments or metolazone if necessary and renal function does not deteriorate. Otherwise continue aspirin, hydralazine, potassium supplements, and Lopressor.  Jonelle Sidle, M.D., F.A.C.C.

## 2016-08-28 ENCOUNTER — Inpatient Hospital Stay (HOSPITAL_COMMUNITY): Payer: BLUE CROSS/BLUE SHIELD

## 2016-08-28 LAB — CBC
HCT: 24.1 % — ABNORMAL LOW (ref 36.0–46.0)
Hemoglobin: 7.6 g/dL — ABNORMAL LOW (ref 12.0–15.0)
MCH: 28.5 pg (ref 26.0–34.0)
MCHC: 31.5 g/dL (ref 30.0–36.0)
MCV: 90.3 fL (ref 78.0–100.0)
PLATELETS: 249 10*3/uL (ref 150–400)
RBC: 2.67 MIL/uL — ABNORMAL LOW (ref 3.87–5.11)
RDW: 17.8 % — AB (ref 11.5–15.5)
WBC: 5.3 10*3/uL (ref 4.0–10.5)

## 2016-08-28 LAB — BASIC METABOLIC PANEL
Anion gap: 6 (ref 5–15)
BUN: 26 mg/dL — AB (ref 6–20)
CHLORIDE: 102 mmol/L (ref 101–111)
CO2: 34 mmol/L — AB (ref 22–32)
CREATININE: 1.49 mg/dL — AB (ref 0.44–1.00)
Calcium: 8.9 mg/dL (ref 8.9–10.3)
GFR calc Af Amer: 43 mL/min — ABNORMAL LOW (ref >60)
GFR calc non Af Amer: 37 mL/min — ABNORMAL LOW (ref >60)
Glucose, Bld: 107 mg/dL — ABNORMAL HIGH (ref 65–99)
Potassium: 4.2 mmol/L (ref 3.5–5.1)
Sodium: 142 mmol/L (ref 135–145)

## 2016-08-28 LAB — GLUCOSE, CAPILLARY
Glucose-Capillary: 89 mg/dL (ref 65–99)
Glucose-Capillary: 109 mg/dL — ABNORMAL HIGH (ref 65–99)
Glucose-Capillary: 131 mg/dL — ABNORMAL HIGH (ref 65–99)
Glucose-Capillary: 151 mg/dL — ABNORMAL HIGH (ref 65–99)

## 2016-08-28 MED ORDER — MORPHINE SULFATE (PF) 2 MG/ML IV SOLN
2.0000 mg | Freq: Once | INTRAVENOUS | Status: AC
  Administered 2016-08-28: 2 mg via INTRAVENOUS
  Filled 2016-08-28: qty 1

## 2016-08-28 MED ORDER — LABETALOL HCL 200 MG PO TABS
100.0000 mg | ORAL_TABLET | Freq: Two times a day (BID) | ORAL | Status: DC
  Administered 2016-08-28 (×2): 100 mg via ORAL
  Filled 2016-08-28 (×2): qty 1

## 2016-08-28 MED ORDER — PROCHLORPERAZINE EDISYLATE 5 MG/ML IJ SOLN
10.0000 mg | Freq: Four times a day (QID) | INTRAMUSCULAR | Status: DC | PRN
  Administered 2016-08-28: 10 mg via INTRAVENOUS
  Filled 2016-08-28: qty 2

## 2016-08-28 MED ORDER — PROMETHAZINE HCL 25 MG/ML IJ SOLN
12.5000 mg | Freq: Four times a day (QID) | INTRAMUSCULAR | Status: DC | PRN
  Administered 2016-08-28: 12.5 mg via INTRAVENOUS
  Filled 2016-08-28: qty 1

## 2016-08-28 MED ORDER — LORAZEPAM 2 MG/ML IJ SOLN
0.5000 mg | Freq: Four times a day (QID) | INTRAMUSCULAR | Status: DC | PRN
  Administered 2016-08-28 – 2016-08-30 (×3): 0.5 mg via INTRAVENOUS
  Filled 2016-08-28 (×3): qty 1

## 2016-08-28 NOTE — Progress Notes (Signed)
PROGRESS NOTE    [name]  ZOX:[id number]  DOB: [birth year]  DOA: 09/24/2016 PCP: [name], MD  Hospital course:  [name] is a 61 y.o. female with medical history significant of CHF, GERD, DM comes in with several days of progressive worsening swelling all over and sob. Admitted to SDU for CHF exacerbation requiring bipap therapy.  She reports that about 3 weeks ago she decreased her lasix dosing from twice a day to once a day because it makes her "pee all night".   Assessment & Plan:   1. Acute respiratory failure with hypoxia - Improved with bipap and now weaning down to nasal cannula.     2. Acute exacerbation of chronic systolic heart failure - likely exacerbated by noncompliance with diuretic therapy and dietary indiscretions.  Continue IV lasix per cardiology team. Pt is down 12 pounds.  She is still very volume overloaded.  Continue to monitor renal function and electrolytes closely.  Echo shows normal ejection fraction.    3. Paroxysmal SVT - has been NSR.   4. Severe abdominal distension - [place] negative for ascites, likely related to CHF volume overload.  Only minimal improvement thus far.  5. Hypernatremia - repeat test within normal limits, continue diuresis.  6. Constipation - add stool softeners to daily regimen.     7. IDDM - treating with sliding scale coverage and monitor closely. BS well controlled.  8. Anemia of chronic kidney disease - following closely, Hg around 8.  Transfuse for hemoglobin less than 7  9. CKD stage 3 - follow closely on diuretics, Holding stable.   DVT prophylaxis:  scds Code Status:  full Family Communication:  no family present   Disposition Plan:  TBD  Consults called:  cardiology  Subjective: Patient is feeling a little better today, although she is still short of breath and uncomfortable edema.      Objective: Vitals:   08/28/16 0600 08/28/16 0700 08/28/16 0742 08/28/16 0800  BP:  (!) 170/95  (!) 173/93    Pulse: 82 81 82 81  Resp: 19 17 16  (!) 26  Temp:   98.3 F (36.8 C)   TempSrc:   Oral   SpO2: 93% 94% 96% 96%  Weight:      Height:        Intake/Output Summary (Last 24 hours) at 08/28/16 1126 Last data filed at 08/28/16 0906  Gross per 24 hour  Intake              250 ml  Output             3550 ml  Net            -3300 ml   Filed Weights   08/26/16 0500 08/27/16 0500 08/28/16 0500  Weight: 129 kg (284 lb 6.3 oz) 128.2 kg (282 lb 10.1 oz) 126 kg (277 lb 12.5 oz)    Exam:  General exam: awake, alert, NAD.  Respiratory system. Fine crackles at bases.  No increased work of breathing. Cardiovascular system: S1 & S2 heard.    Gastrointestinal system: Abdomen is severely distended and tight,nontender. Normal bowel sounds heard. Central nervous system: Alert and oriented. No focal neurological deficits. Extremities: 1+ edema bilateral LEs and upper extremities.  TED Hoses on.    Data Reviewed: Basic Metabolic Panel:  Recent Labs Lab 09/24/16 0035 09/24/16 0057 08/26/16 0500 08/27/16 0409 08/28/16 0412  NA 142 145 147* 144 142  K 3.7 3.6 3.5 3.9 4.2  CL  103 98* 106 105 102  CO2 32  --  35* 34* 34*  GLUCOSE 130* 131* 142* 101* 107*  BUN 21* 12 22* 26* 26*  CREATININE 1.39* 1.40* 1.55* 1.60* 1.49*  CALCIUM 8.8*  --  9.0 8.8* 8.9  MG  --   --  1.7 1.6*  --    Liver Function Tests:  Recent Labs Lab 2016-09-30 0035  AST 29  ALT 19  ALKPHOS 197*  BILITOT 0.8  PROT 7.9  ALBUMIN 2.8*   No results for input(s): LIPASE, AMYLASE in the last 168 hours. No results for input(s): AMMONIA in the last 168 hours. CBC:  Recent Labs Lab 2016-09-30 0035 2016-09-30 0057 08/26/16 0500 08/27/16 0409 08/28/16 0412  WBC 7.0  --  7.0 5.8 5.3  NEUTROABS 4.5  --   --   --   --   HGB 8.8* 9.5* 8.0* 7.8* 7.6*  HCT 27.3* 28.0* 24.8* 23.8* 24.1*  MCV 89.8  --  89.9 89.5 90.3  PLT 295  --  270 227 249   Cardiac Enzymes:  Recent Labs Lab 2016-09-30 0035 2016-09-30 1315   TROPONINI <0.03 <0.03   CBG (last 3)   Recent Labs  08/27/16 1648 08/27/16 2132 08/28/16 0741  GLUCAP 120* 101* 89   Recent Results (from the past 240 hour(s))  Blood culture (routine x 2)     Status: None (Preliminary result)   Collection Time: 2016-09-30  3:11 AM  Result Value Ref Range Status   Specimen Description BLOOD RIGHT HAND  Final   Special Requests BOTTLES DRAWN AEROBIC AND ANAEROBIC 6CC  Final   Culture NO GROWTH 3 DAYS  Final   Report Status PENDING  Incomplete  Blood culture (routine x 2)     Status: None (Preliminary result)   Collection Time: 2016-09-30  3:20 AM  Result Value Ref Range Status   Specimen Description BLOOD RIGHT HAND  Final   Special Requests BOTTLES DRAWN AEROBIC AND ANAEROBIC 6CC  Final   Culture NO GROWTH 3 DAYS  Final   Report Status PENDING  Incomplete  MRSA PCR Screening     Status: None   Collection Time: 2016-09-30  9:15 AM  Result Value Ref Range Status   MRSA by PCR NEGATIVE NEGATIVE Final    Comment:        The GeneXpert MRSA Assay (FDA approved for NASAL specimens only), is one component of a comprehensive MRSA colonization surveillance program. It is not intended to diagnose MRSA infection nor to guide or monitor treatment for MRSA infections.      Studies: No results found. Scheduled Meds: . aspirin EC  325 mg Oral Daily  . Chlorhexidine Gluconate Cloth  6 each Topical Q0600  . FLUoxetine  20 mg Oral BID  . furosemide  40 mg Intravenous BID  . hydrALAZINE  50 mg Oral TID  . insulin aspart  0-9 Units Subcutaneous TID WC  . labetalol  100 mg Oral BID  . mouth rinse  15 mL Mouth Rinse BID  . potassium chloride  60 mEq Oral QHS  . senna-docusate  1 tablet Oral BID  . sodium chloride flush  3 mL Intravenous Q12H   Continuous Infusions:  Principal Problem:   Acute on chronic diastolic heart failure (HCC) Active Problems:   IDDM (insulin dependent diabetes mellitus) (HCC)   Essential hypertension   Acute respiratory  failure with hypoxia (HCC)   Anemia of chronic disease   CKD (chronic kidney disease), stage III  h/o dialysis needs spring 2017   Paroxysmal SVT (supraventricular tachycardia) (HCC)   History of noncompliance with medical treatment, presenting hazards to health   Pericardial effusion   Hypoalbuminemia  Time spent: 25 mins  MEMON,JEHANZEB, MD [facility] Pager [phone]  If 7PM-7AM, please contact night-coverage [email or web address] Password [email or web address] 08/28/2016, 11:26 AM    LOS: 3 days

## 2016-08-28 NOTE — Progress Notes (Signed)
Progress Note  Patient Name: Sophia EllisonGenevieve A [name] Date of Encounter: 08/28/2016  Primary Cardiologist: Dr. Prentice DockerSuresh Koneswaran  Subjective   Feels overall less short of breath, abdomen nontender. Tolerating oral intake. No chest pain.  Inpatient Medications    Scheduled Meds: . aspirin EC  325 mg Oral Daily  . Chlorhexidine Gluconate Cloth  6 each Topical Q0600  . FLUoxetine  20 mg Oral BID  . furosemide  40 mg Intravenous BID  . hydrALAZINE  50 mg Oral TID  . insulin aspart  0-9 Units Subcutaneous TID WC  . mouth rinse  15 mL Mouth Rinse BID  . metoprolol tartrate  25 mg Oral BID  . potassium chloride  60 mEq Oral QHS  . senna-docusate  1 tablet Oral BID  . sodium chloride flush  3 mL Intravenous Q12H    PRN Meds: sodium chloride, acetaminophen, albuterol, hydrALAZINE, LORazepam, ondansetron (ZOFRAN) IV, sodium chloride flush   Vital Signs    Vitals:   08/28/16 0400 08/28/16 0500 08/28/16 0600 08/28/16 0742  BP: (!) 161/89 (!) 162/86    Pulse: 79 79 82 82  Resp: 11 18 19 16   Temp: 98.5 F (36.9 C)   98.3 F (36.8 C)  TempSrc: Oral   Oral  SpO2: 96% 97% 93% 96%  Weight:  277 lb 12.5 oz (126 kg)    Height:        Intake/Output Summary (Last 24 hours) at 08/28/16 0817 Last data filed at 08/28/16 0500  Gross per 24 hour  Intake              240 ml  Output             3550 ml  Net            -3310 ml   Filed Weights   08/26/16 0500 08/27/16 0500 08/28/16 0500  Weight: 284 lb 6.3 oz (129 kg) 282 lb 10.1 oz (128.2 kg) 277 lb 12.5 oz (126 kg)    Telemetry    Sinus rhythm. Personally reviewed.   Physical Exam   GEN: Morbidly obese, no acute distress.   Neck: Mild JVD. Cardiac: Distant, RRR, no gallop.  Respiratory: Nonlabored. Decreased breath sounds. GI:  Obese, bowel sounds present. MS:  Mild hand edema; No deformity.  Labs    Chemistry Recent Labs Lab 01-Jul-2016 0035  08/26/16 0500 08/27/16 0409 08/28/16 0412  NA 142  < > 147* 144 142  K 3.7   < > 3.5 3.9 4.2  CL 103  < > 106 105 102  CO2 32  --  35* 34* 34*  GLUCOSE 130*  < > 142* 101* 107*  BUN 21*  < > 22* 26* 26*  CREATININE 1.39*  < > 1.55* 1.60* 1.49*  CALCIUM 8.8*  --  9.0 8.8* 8.9  PROT 7.9  --   --   --   --   ALBUMIN 2.8*  --   --   --   --   AST 29  --   --   --   --   ALT 19  --   --   --   --   ALKPHOS 197*  --   --   --   --   BILITOT 0.8  --   --   --   --   GFRNONAA 40*  --  35* 34* 37*  GFRAA 46*  --  41* 39* 43*  ANIONGAP 7  --  6 5  6  < > = values in this interval not displayed.   Hematology Recent Labs Lab 08/26/16 0500 08/27/16 0409 08/28/16 0412  WBC 7.0 5.8 5.3  RBC 2.76* 2.66* 2.67*  HGB 8.0* 7.8* 7.6*  HCT 24.8* 23.8* 24.1*  MCV 89.9 89.5 90.3  MCH 29.0 29.3 28.5  MCHC 32.3 32.8 31.5  RDW 17.8* 17.6* 17.8*  PLT 270 227 249    Cardiac Enzymes Recent Labs Lab 08/10/2016 0035 09/07/2016 1315  TROPONINI <0.03 <0.03    Recent Labs Lab 08/20/2016 0054  TROPIPOC 0.00     BNP Recent Labs Lab 08/12/2016 0035  BNP 1,208.0*     Radiology    US Abdomen Limited  Result Date: 08/26/2016 CLINICAL DATA:  Ascites. EXAM: LIMITED ABDOMEN ULTRASOUND FOR ASCITES TECHNIQUE: Limited ultrasound survey for ascites was performed in all four abdominal quadrants. COMPARISON:  CT 02/27/2016 FINDINGS: Ultrasound performed in all 4 quadrants to assess for ascites. There is a small amount of ascites noted, best seen in the left upper and lower quadrants. IMPRESSION: Small amount of left abdominal ascites. This is not sufficient to tap. Electronically Signed   By: Charlett Nose M.D.   On: 08/26/2016 10:19    Cardiac Studies   Echocardiogram 08/26/2016: Study Conclusions  - Left ventricle: The cavity size was normal. Wall thickness was   increased in a pattern of moderate LVH. Systolic function was   normal. The estimated ejection fraction was in the range of 60%   to 65%. Wall motion was normal; there were no regional wall   motion abnormalities. -  Aortic valve: Mildly calcified annulus. Mildly thickened   leaflets. - Mitral valve: Mildly calcified annulus. Mildly thickened leaflets. - Right ventricle: The cavity size was moderately dilated. - Right atrium: The atrium was moderately dilated. - Atrial septum: The septum bowed from right to left, consistent   with increased right atrial pressure. - Pericardium, extracardiac: A trivial to small circumferential   pericardial effusion was identified.  Patient Profile     61 y.o. female morbid obesity, chronic respiratory failure on home O2 (pt states she does not know what for), PSVT noted in 2017 hospital admission briefly, IDDM, HTN, HLD, chronic-appearing anemia, CKD stage III with variable Cr in Epic, recurrent skin abscesses with prior sepsis, GERD, depression, anxiety, tobacco abuse, habitual noncompliance who presented to [facility] with worsening SOB/diffuse body edema, 65lb weight gain since 06/2016 concerning for a/c diastolic CHF. Has been eating high sodium foods, drinking excess fluid, and not taking her medication as prescribed. Prior DC weight 237lb, admitted at 300lb.  Assessment & Plan    1. Acute on chronic diastolic heart failure, LVEF 60-65%, also right ventricular dysfunction. Patient continues to diurese well on IV Lasix, at approximately 3300 cc out more than an last 24 hours and creatinine overall stable. Weight continues to drop although she is not close to baseline yet.  2. CKD, stage 3. Follow-up creatinine 1.5.  3. History of noncompliance with low sodium diet and fluid restriction. This is been reinforced.  4. Tobacco abuse, smoking cessation addressed.  5. History of PSVT, no recurrences at this time.  6. Essential hypertension, systolic blood pressure 160s to 170s. On hydralazine and Lopressor.  Continue current dose of IV Lasix with good diuresis and stable creatinine. Will continue hydralazine and change from lopressor to labetalol. Consider transfer to telemetry  and involvement of physical therapy.  Signed, Nona Dell, MD  08/28/2016, 8:17 AM

## 2016-08-28 NOTE — Care Management Note (Signed)
Case Management Note  Patient Details  Name: [name] MRN: [id number] Date of Birth: 02-28-1956  Subjective/Objective:                  Adm from home acute on chronic heart failure. She is from home with family, has cane, walker and oxygen PTA. She does not have HH services currently but has had [facility] in the past. She has a PCP (Dr. Margo Commonapper), family takes her to appointments.   Action/Plan:  Anticipate DC home possibly with HH services. ? Need for PT eval when appropriate.   Expected Discharge Date:  08/29/16               Expected Discharge Plan:  Home w Home Health Services  In-House Referral:     Discharge planning Services  CM Consult  Post Acute Care Choice:  NA Choice offered to:     DME Arranged:    DME Agency:     HH Arranged:    HH Agency:     Status of Service:  In process, will continue to follow  If discussed at Long Length of Stay Meetings, dates discussed:    Additional Comments:  [name], Sophia OilerSharley Diane, RN 08/28/2016, 1:19 PM

## 2016-08-29 DIAGNOSIS — J9621 Acute and chronic respiratory failure with hypoxia: Secondary | ICD-10-CM

## 2016-08-29 LAB — GLUCOSE, CAPILLARY
GLUCOSE-CAPILLARY: 141 mg/dL — AB (ref 65–99)
GLUCOSE-CAPILLARY: 135 mg/dL — AB (ref 65–99)
Glucose-Capillary: 143 mg/dL — ABNORMAL HIGH (ref 65–99)
Glucose-Capillary: 119 mg/dL — ABNORMAL HIGH (ref 65–99)

## 2016-08-29 LAB — CBC
HEMATOCRIT: 25.5 % — AB (ref 36.0–46.0)
Hemoglobin: 8.2 g/dL — ABNORMAL LOW (ref 12.0–15.0)
MCH: 29.1 pg (ref 26.0–34.0)
MCHC: 32.2 g/dL (ref 30.0–36.0)
MCV: 90.4 fL (ref 78.0–100.0)
PLATELETS: 269 10*3/uL (ref 150–400)
RBC: 2.82 MIL/uL — ABNORMAL LOW (ref 3.87–5.11)
RDW: 17.6 % — AB (ref 11.5–15.5)
WBC: 6.2 10*3/uL (ref 4.0–10.5)

## 2016-08-29 LAB — BASIC METABOLIC PANEL
Anion gap: 9 (ref 5–15)
BUN: 23 mg/dL — ABNORMAL HIGH (ref 6–20)
CALCIUM: 9.2 mg/dL (ref 8.9–10.3)
CO2: 34 mmol/L — ABNORMAL HIGH (ref 22–32)
CREATININE: 1.35 mg/dL — AB (ref 0.44–1.00)
Chloride: 99 mmol/L — ABNORMAL LOW (ref 101–111)
GFR calc Af Amer: 48 mL/min — ABNORMAL LOW (ref >60)
GFR, EST NON AFRICAN AMERICAN: 41 mL/min — AB (ref >60)
GLUCOSE: 148 mg/dL — AB (ref 65–99)
POTASSIUM: 4.3 mmol/L (ref 3.5–5.1)
SODIUM: 142 mmol/L (ref 135–145)

## 2016-08-29 MED ORDER — LABETALOL HCL 200 MG PO TABS
200.0000 mg | ORAL_TABLET | Freq: Two times a day (BID) | ORAL | Status: DC
  Administered 2016-08-29 (×2): 200 mg via ORAL
  Filled 2016-08-29 (×3): qty 1

## 2016-08-29 NOTE — Progress Notes (Signed)
Progress Note  Patient Name: Sophia EllisonGenevieve A [name] Date of Encounter: 08/29/2016  Primary Cardiologist: Dr. Prentice DockerSuresh Koneswaran  Subjective   Feels better overall. No chest pain or breathlessness at rest. No abdominal pain.  Inpatient Medications    Scheduled Meds: . aspirin EC  325 mg Oral Daily  . Chlorhexidine Gluconate Cloth  6 each Topical Q0600  . FLUoxetine  20 mg Oral BID  . furosemide  40 mg Intravenous BID  . hydrALAZINE  50 mg Oral TID  . insulin aspart  0-9 Units Subcutaneous TID WC  . labetalol  100 mg Oral BID  . mouth rinse  15 mL Mouth Rinse BID  . potassium chloride  60 mEq Oral QHS  . senna-docusate  1 tablet Oral BID  . sodium chloride flush  3 mL Intravenous Q12H    PRN Meds: sodium chloride, acetaminophen, albuterol, hydrALAZINE, LORazepam, LORazepam, ondansetron (ZOFRAN) IV, prochlorperazine, sodium chloride flush   Vital Signs    Vitals:   08/29/16 0300 08/29/16 0400 08/29/16 0500 08/29/16 0725  BP: (!) 176/94 (!) 175/91 (!) 172/85   Pulse: 93 96 94 95  Resp: 19 20 18 13   Temp:  98 F (36.7 C)  98.5 F (36.9 C)  TempSrc:  Axillary  Oral  SpO2: 94% 92% 97% 96%  Weight:   265 lb 14 oz (120.6 kg)   Height:        Intake/Output Summary (Last 24 hours) at 08/29/16 0836 Last data filed at 08/29/16 0500  Gross per 24 hour  Intake              490 ml  Output             6550 ml  Net            -6060 ml   Filed Weights   08/27/16 0500 08/28/16 0500 08/29/16 0500  Weight: 282 lb 10.1 oz (128.2 kg) 277 lb 12.5 oz (126 kg) 265 lb 14 oz (120.6 kg)    Telemetry    Sinus rhythm. Personally reviewed.  Physical Exam   GEN: Morbidly obese, no acute distress.   Neck: Mild JVD. Cardiac: Distant, RRR, no gallop.  Respiratory: Nonlabored. Decreased breath sounds. GI:  Obese, bowel sounds present. MS:  Mild hand edema; No deformity.  Labs    Chemistry Recent Labs Lab 05/10/17 0035  08/27/16 0409 08/28/16 0412 08/29/16 0434  NA 142  < >  144 142 142  K 3.7  < > 3.9 4.2 4.3  CL 103  < > 105 102 99*  CO2 32  < > 34* 34* 34*  GLUCOSE 130*  < > 101* 107* 148*  BUN 21*  < > 26* 26* 23*  CREATININE 1.39*  < > 1.60* 1.49* 1.35*  CALCIUM 8.8*  < > 8.8* 8.9 9.2  PROT 7.9  --   --   --   --   ALBUMIN 2.8*  --   --   --   --   AST 29  --   --   --   --   ALT 19  --   --   --   --   ALKPHOS 197*  --   --   --   --   BILITOT 0.8  --   --   --   --   GFRNONAA 40*  < > 34* 37* 41*  GFRAA 46*  < > 39* 43* 48*  ANIONGAP 7  < > 5 6  9  < > = values in this interval not displayed.   Hematology  Recent Labs Lab 08/27/16 0409 08/28/16 0412 08/29/16 0434  WBC 5.8 5.3 6.2  RBC 2.66* 2.67* 2.82*  HGB 7.8* 7.6* 8.2*  HCT 23.8* 24.1* 25.5*  MCV 89.5 90.3 90.4  MCH 29.3 28.5 29.1  MCHC 32.8 31.5 32.2  RDW 17.6* 17.8* 17.6*  PLT 227 249 269    Cardiac Enzymes  Recent Labs Lab Sep 20, 2016 0035 20-Sep-2016 1315  TROPONINI <0.03 <0.03     Recent Labs Lab 20-Sep-2016 0054  TROPIPOC 0.00     BNP  Recent Labs Lab 20-Sep-2016 0035  BNP 1,208.0*     Radiology    Dg Abd Portable 1v  Result Date: 08/28/2016 CLINICAL DATA:  Abdominal pain EXAM: PORTABLE ABDOMEN - 1 VIEW COMPARISON:  CT scan 02/27/16 FINDINGS: Markedly limited study by patient's large body habitus. Grossly normal small bowel gas pattern. No significant colonic stool noted. Degenerative changes lumbar spine. IMPRESSION: Markedly limited study by patient's large body habitus. Grossly normal small bowel gas pattern. No significant colonic stool noted. Electronically Signed   By: Natasha Mead M.D.   On: 08/28/2016 21:12    Cardiac Studies   Echocardiogram 08/26/2016: Study Conclusions  - Left ventricle: The cavity size was normal. Wall thickness was   increased in a pattern of moderate LVH. Systolic function was   normal. The estimated ejection fraction was in the range of 60%   to 65%. Wall motion was normal; there were no regional wall   motion abnormalities. -  Aortic valve: Mildly calcified annulus. Mildly thickened   leaflets. - Mitral valve: Mildly calcified annulus. Mildly thickened leaflets. - Right ventricle: The cavity size was moderately dilated. - Right atrium: The atrium was moderately dilated. - Atrial septum: The septum bowed from right to left, consistent   with increased right atrial pressure. - Pericardium, extracardiac: A trivial to small circumferential   pericardial effusion was identified.  Patient Profile     61 y.o. female morbid obesity, chronic respiratory failure on home O2 (pt states she does not know what for), PSVT noted in 2017 hospital admission briefly, IDDM, HTN, HLD, chronic-appearing anemia, CKD stage III with variable Cr in Epic, recurrent skin abscesses with prior sepsis, GERD, depression, anxiety, tobacco abuse, habitual noncompliance who presented to [facility] with worsening SOB/diffuse body edema, 65lb weight gain since 06/2016 concerning for a/c diastolic CHF. Has been eating high sodium foods, drinking excess fluid, and not taking her medication as prescribed. Prior DC weight 237lb, admitted at 300lb.  Assessment & Plan    1. Acute on chronic diastolic heart failure, LVEF 60-65%, also right ventricular dysfunction. Patient continues with excellent diuresis on IV Lasix, approximately 6000 cc out more than an last 24 hours and creatinine overall stable. Weight continues to drop, still potentially 30 pounds above baseline however.  2. CKD, stage 3. Follow-up creatinine 1.4 (improved).  3. History of noncompliance with low sodium diet and fluid restriction. This has been reinforced.  4. History of PSVT, no recurrences.  6. Essential hypertension, systolic blood pressure 150s to 170s. On hydralazine and Labetalol (new).  Continue current dose of IV Lasix, follow urine output and renal function. Increase Labetalol dose for better blood pressure control. Consider PT assessment with increased mobilization as tolerated,  could possibly be transferred to telemetry.  Signed, Nona Dell, MD  08/29/2016, 8:36 AM

## 2016-08-29 NOTE — Evaluation (Signed)
Physical Therapy Evaluation Patient Details Name: [name] MRN: [id number] DOB: [birth year] Today's Date: 08/29/2016   History of Present Illness  61 y.o. female with medical history significant of CHF, GERD, DM comes in with several days of progressive worsening swelling all over and sob.  She is on 2 liters oxygen at home, and had to increase this up to 5 liters the last day or so due to sob.  She denies any fevers, no cough.  She reports that about 3 weeks ago she decreased her lasix dosing from twice a day to once a day because it makes her "pee all night".  She was not swollen at all then.  About 4 days ago she started to swell more.  She does not check her weight at all as she does not have a scale at home to do so.  She denies chest pain.  Pt found to be grossly volume overloaded and in chf exacerbation in the ED.  Hypoxic also.  Treated for pna, copde and chf.  Referred for admission for treatment of her resp distress, she has been placed on bipap.  Dx: Acute exacerbation of chronic diastolic heart failure - likely exacerbated by noncompliance with diuretic therapy and dietary indiscretions, and acute on chronic respiratory failure.     Clinical Impression  Pt received in bed, and was agreeable to PT evaluation.  Pt states that she was using a RW for ambulation, and required assistance for bathing, but she was able to dress herself.  During PT evaluation, she was able to ambulate 62ft with RW and min guard.  Further distances was limited due to fatigue. At this point, she would benefit from HHPT upon d/c to continue to increase mobility, strength, and balance.       Follow Up Recommendations Home health PT;Supervision/Assistance - 24 hour    Equipment Recommendations  None recommended by PT    Recommendations for Other Services       Precautions / Restrictions Precautions Precautions: Fall Precaution Comments: Due to immobility, and pt reports that she has a fear of falling.   Restrictions Weight Bearing Restrictions: No      Mobility  Bed Mobility Overal bed mobility: Needs Assistance Bed Mobility: Supine to Sit     Supine to sit: Min assist;HOB elevated        Transfers Overall transfer level: Needs assistance Equipment used: Rolling walker (2 wheeled) Transfers: Sit to/from Stand Sit to Stand: Min guard;From elevated surface            Ambulation/Gait Ambulation/Gait assistance: Min guard Ambulation Distance (Feet): 20 Feet Assistive device: Rolling walker (2 wheeled) Gait Pattern/deviations: Step-to pattern;Wide base of support   Gait velocity interpretation: <1.8 ft/sec, indicative of risk for recurrent falls General Gait Details: Slow cadence.  Further distance limited by fatigue.   Stairs            Wheelchair Mobility    Modified Rankin (Stroke Patients Only)       Balance Overall balance assessment: Needs assistance Sitting-balance support: Bilateral upper extremity supported;Feet supported Sitting balance-Leahy Scale: Good     Standing balance support: Bilateral upper extremity supported Standing balance-Leahy Scale: Fair                               Pertinent Vitals/Pain Pain Assessment: No/denies pain    Home Living   Living Arrangements: Children (Dtr and 3 grandchildren) Available Help at Discharge: Family;Available  24 hours/day Type of Home: House Home Access: Stairs to enter   [employer] CorporationEntrance Stairs-Number of Steps: 4 with HR Home Layout: One level;Able to live on main level with bedroom/bathroom Home Equipment: Dan HumphreysWalker - 2 wheels;Cane - single point;Wheelchair - manual;Bedside commode      Prior Function Level of Independence: Needs assistance;Independent with assistive device(s)   Gait / Transfers Assistance Needed: Pt states she has been using the RW for ambulation, and was still able to get out into the commuity   ADL's / Homemaking Assistance Needed: Pt states that she was  independent with dressing, but required assistance for bathing.  States she has been taking sponge baths.   Comments: Celine Ahrunt states that she is going to be staying with her upon d/c.       Hand Dominance        Extremity/Trunk Assessment   Upper Extremity Assessment Upper Extremity Assessment: Generalized weakness    Lower Extremity Assessment Lower Extremity Assessment: Generalized weakness       Communication   Communication: No difficulties  Cognition Arousal/Alertness: Awake/alert Behavior During Therapy: WFL for tasks assessed/performed Overall Cognitive Status: Within Functional Limits for tasks assessed                      General Comments      Exercises     Assessment/Plan    PT Assessment Patient needs continued PT services  PT Problem List Decreased strength;Decreased activity tolerance;Decreased balance;Decreased mobility;Cardiopulmonary status limiting activity;Obesity       PT Treatment Interventions DME instruction;Gait training;Functional mobility training;Therapeutic activities;Therapeutic exercise;Balance training;Patient/family education    PT Goals (Current goals can be found in the Care Plan section)  Acute Rehab PT Goals Patient Stated Goal: pt wants to get stronger and go home.  PT Goal Formulation: With patient Time For Goal Achievement: 09/12/16 Potential to Achieve Goals: Fair    Frequency Min 3X/week   Barriers to discharge        Co-evaluation               End of Session Equipment Utilized During Treatment: Gait belt;Oxygen Activity Tolerance: Patient limited by fatigue Patient left: in chair;with call bell/phone within reach Nurse Communication: Mobility status Diannia Ruder(Kara, RN notified of pt's mobility status, and mobiltiy sheet left hanging in the room. ) PT Visit Diagnosis: Muscle weakness (generalized) (M62.81);Other abnormalities of gait and mobility (R26.89)    Functional Assessment Tool Used: AM-PAC 6 Clicks  Basic Mobility;Clinical judgement Functional Limitation: Mobility: Walking and moving around Mobility: Walking and Moving Around Current Status (H0865(G8978): At least 20 percent but less than 40 percent impaired, limited or restricted Mobility: Walking and Moving Around Goal Status [phone](G8979): At least 1 percent but less than 20 percent impaired, limited or restricted    Time: 1032-1100 PT Time Calculation (min) (ACUTE ONLY): 28 min   Charges:   PT Evaluation $PT Eval Low Complexity: 1 Procedure PT Treatments $Gait Training: 8-22 mins   PT G Codes:   PT G-Codes **NOT FOR INPATIENT CLASS** Functional Assessment Tool Used: AM-PAC 6 Clicks Basic Mobility;Clinical judgement Functional Limitation: Mobility: Walking and moving around Mobility: Walking and Moving Around Current Status (G2952(G8978): At least 20 percent but less than 40 percent impaired, limited or restricted Mobility: Walking and Moving Around Goal Status [phone](G8979): At least 1 percent but less than 20 percent impaired, limited or restricted     Beth Karista Aispuro, PT, DPT X: [phone]

## 2016-08-29 NOTE — Progress Notes (Signed)
PROGRESS NOTE    [name]  ZOX:[id number]  DOB: 12-07-1955  DOA: 09-21-2016 PCP: [name], MD  Hospital course:  [name] is a 61 y.o. female with medical history significant of CHF, GERD, DM comes in with several days of progressive worsening swelling all over and sob. Admitted to SDU for CHF exacerbation requiring bipap therapy.  She reports that about 3 weeks ago she decreased her lasix dosing from twice a day to once a day because it makes her "pee all night".   Assessment & Plan:   1. Acute on chronic respiratory failure with hypoxia - She is chronically on 2L oxygen. Improved with bipap and now weaning back down to nasal cannula.     2. Acute exacerbation of chronic diastolic heart failure - likely exacerbated by noncompliance with diuretic therapy and dietary indiscretions.  Continue IV lasix per cardiology team. Pt is down 24 pounds since admission.  She is still very volume overloaded.  Continue to monitor renal function and electrolytes closely.  Echo shows normal ejection fraction.  3. Uncontrolled hypertension. On hydralazine and labetalol. Labetalol dose increased today   4. Paroxysmal SVT - has been NSR.   5. Severe abdominal distension - [place] negative for ascites, likely related to CHF volume overload.  Some improvement thus far.   6. Constipation - add stool softeners to daily regimen.     7. IDDM - treating with sliding scale coverage and monitor closely. BS well controlled.  8. Anemia of chronic kidney disease - following closely, Hg around 8.  Transfuse for hemoglobin less than 7  9. CKD stage 3 - follow closely on diuretics, Holding stable.   DVT prophylaxis:  scds Code Status:  full Family Communication:  discussed with aunt at the bedside Disposition Plan:  TBD  Consults called:  cardiology  Subjective: Patient had severe nausea and vomiting yesterday, but has since resolved. She also had severe anxiety yesterday, but that is also  better today. Shortness of breath improving but not resolved.   Objective: Vitals:   08/29/16 0300 08/29/16 0400 08/29/16 0500 08/29/16 0725  BP: (!) 176/94 (!) 175/91 (!) 172/85   Pulse: 93 96 94 95  Resp: 19 20 18 13   Temp:  98 F (36.7 C)  98.5 F (36.9 C)  TempSrc:  Axillary  Oral  SpO2: 94% 92% 97% 96%  Weight:   120.6 kg (265 lb 14 oz)   Height:        Intake/Output Summary (Last 24 hours) at 08/29/16 0915 Last data filed at 08/29/16 0500  Gross per 24 hour  Intake              240 ml  Output             6550 ml  Net            -6310 ml   Filed Weights   08/27/16 0500 08/28/16 0500 08/29/16 0500  Weight: 128.2 kg (282 lb 10.1 oz) 126 kg (277 lb 12.5 oz) 120.6 kg (265 lb 14 oz)    Exam:  General exam: awake, alert, NAD.  Respiratory system. Fine crackles at bases.  No increased work of breathing. Cardiovascular system: S1 & S2 heard.    Gastrointestinal system: Abdomen is distended and tight, nontender. Normal bowel sounds heard. Central nervous system: Alert and oriented. No focal neurological deficits. Extremities: 1+ edema bilateral LEs and upper extremities.  TED Hoses on.    Data Reviewed: Basic Metabolic Panel:  Recent Labs Lab 08/19/2016 0035 08/17/2016 0057 08/26/16 0500 08/27/16 0409 08/28/16 0412 08/29/16 0434  NA 142 145 147* 144 142 142  K 3.7 3.6 3.5 3.9 4.2 4.3  CL 103 98* 106 105 102 99*  CO2 32  --  35* 34* 34* 34*  GLUCOSE 130* 131* 142* 101* 107* 148*  BUN 21* 12 22* 26* 26* 23*  CREATININE 1.39* 1.40* 1.55* 1.60* 1.49* 1.35*  CALCIUM 8.8*  --  9.0 8.8* 8.9 9.2  MG  --   --  1.7 1.6*  --   --    Liver Function Tests:  Recent Labs Lab 08/11/2016 0035  AST 29  ALT 19  ALKPHOS 197*  BILITOT 0.8  PROT 7.9  ALBUMIN 2.8*   No results for input(s): LIPASE, AMYLASE in the last 168 hours. No results for input(s): AMMONIA in the last 168 hours. CBC:  Recent Labs Lab 09/06/2016 0035 09/04/2016 0057 08/26/16 0500 08/27/16 0409  08/28/16 0412 08/29/16 0434  WBC 7.0  --  7.0 5.8 5.3 6.2  NEUTROABS 4.5  --   --   --   --   --   HGB 8.8* 9.5* 8.0* 7.8* 7.6* 8.2*  HCT 27.3* 28.0* 24.8* 23.8* 24.1* 25.5*  MCV 89.8  --  89.9 89.5 90.3 90.4  PLT 295  --  270 227 249 269   Cardiac Enzymes:  Recent Labs Lab 08/19/2016 0035 08/22/2016 1315  TROPONINI <0.03 <0.03   CBG (last 3)   Recent Labs  08/28/16 1618 08/28/16 2120 08/29/16 0725  GLUCAP 131* 151* 143*   Recent Results (from the past 240 hour(s))  Blood culture (routine x 2)     Status: None (Preliminary result)   Collection Time: 08/10/2016  3:11 AM  Result Value Ref Range Status   Specimen Description BLOOD RIGHT HAND  Final   Special Requests BOTTLES DRAWN AEROBIC AND ANAEROBIC 6CC  Final   Culture NO GROWTH 3 DAYS  Final   Report Status PENDING  Incomplete  Blood culture (routine x 2)     Status: None (Preliminary result)   Collection Time: 08/24/2016  3:20 AM  Result Value Ref Range Status   Specimen Description BLOOD RIGHT HAND  Final   Special Requests BOTTLES DRAWN AEROBIC AND ANAEROBIC 6CC  Final   Culture NO GROWTH 3 DAYS  Final   Report Status PENDING  Incomplete  MRSA PCR Screening     Status: None   Collection Time: 08/21/2016  9:15 AM  Result Value Ref Range Status   MRSA by PCR NEGATIVE NEGATIVE Final    Comment:        The GeneXpert MRSA Assay (FDA approved for NASAL specimens only), is one component of a comprehensive MRSA colonization surveillance program. It is not intended to diagnose MRSA infection nor to guide or monitor treatment for MRSA infections.      Studies: Dg Abd Portable 1v  Result Date: 08/28/2016 CLINICAL DATA:  Abdominal pain EXAM: PORTABLE ABDOMEN - 1 VIEW COMPARISON:  CT scan 02/27/16 FINDINGS: Markedly limited study by patient's large body habitus. Grossly normal small bowel gas pattern. No significant colonic stool noted. Degenerative changes lumbar spine. IMPRESSION: Markedly limited study by patient's large  body habitus. Grossly normal small bowel gas pattern. No significant colonic stool noted. Electronically Signed   By: Natasha MeadLiviu  Pop M.D.   On: 08/28/2016 21:12   Scheduled Meds: . aspirin EC  325 mg Oral Daily  . Chlorhexidine Gluconate Cloth  6 each Topical Q0600  .  FLUoxetine  20 mg Oral BID  . furosemide  40 mg Intravenous BID  . hydrALAZINE  50 mg Oral TID  . insulin aspart  0-9 Units Subcutaneous TID WC  . labetalol  200 mg Oral BID  . mouth rinse  15 mL Mouth Rinse BID  . potassium chloride  60 mEq Oral QHS  . senna-docusate  1 tablet Oral BID  . sodium chloride flush  3 mL Intravenous Q12H   Continuous Infusions:  Principal Problem:   Acute on chronic diastolic heart failure (HCC) Active Problems:   IDDM (insulin dependent diabetes mellitus) (HCC)   Essential hypertension   Acute respiratory failure with hypoxia (HCC)   Anemia of chronic disease   CKD (chronic kidney disease), stage III   h/o dialysis needs spring 2017   Paroxysmal SVT (supraventricular tachycardia) (HCC)   History of noncompliance with medical treatment, presenting hazards to health   Pericardial effusion   Hypoalbuminemia  Time spent: 25 mins  MEMON,JEHANZEB, MD [facility] Pager [phone]  If 7PM-7AM, please contact night-coverage [email or web address] Password [email or web address] 08/29/2016, 9:15 AM    LOS: 4 days

## 2016-08-30 LAB — BASIC METABOLIC PANEL
Anion gap: 7 (ref 5–15)
BUN: 21 mg/dL — AB (ref 6–20)
CHLORIDE: 99 mmol/L — AB (ref 101–111)
CO2: 37 mmol/L — ABNORMAL HIGH (ref 22–32)
CREATININE: 1.27 mg/dL — AB (ref 0.44–1.00)
Calcium: 9.2 mg/dL (ref 8.9–10.3)
GFR calc Af Amer: 52 mL/min — ABNORMAL LOW (ref >60)
GFR, EST NON AFRICAN AMERICAN: 45 mL/min — AB (ref >60)
GLUCOSE: 129 mg/dL — AB (ref 65–99)
POTASSIUM: 4.6 mmol/L (ref 3.5–5.1)
SODIUM: 143 mmol/L (ref 135–145)

## 2016-08-30 LAB — CULTURE, BLOOD (ROUTINE X 2)
CULTURE: NO GROWTH
Culture: NO GROWTH

## 2016-08-30 LAB — GLUCOSE, CAPILLARY
GLUCOSE-CAPILLARY: 110 mg/dL — AB (ref 65–99)
Glucose-Capillary: 135 mg/dL — ABNORMAL HIGH (ref 65–99)
Glucose-Capillary: 124 mg/dL — ABNORMAL HIGH (ref 65–99)
Glucose-Capillary: 107 mg/dL — ABNORMAL HIGH (ref 65–99)

## 2016-08-30 LAB — CBC
HEMATOCRIT: 23.9 % — AB (ref 36.0–46.0)
Hemoglobin: 7.8 g/dL — ABNORMAL LOW (ref 12.0–15.0)
MCH: 29.4 pg (ref 26.0–34.0)
MCHC: 32.6 g/dL (ref 30.0–36.0)
MCV: 90.2 fL (ref 78.0–100.0)
PLATELETS: 239 10*3/uL (ref 150–400)
RBC: 2.65 MIL/uL — ABNORMAL LOW (ref 3.87–5.11)
RDW: 17.7 % — AB (ref 11.5–15.5)
WBC: 6.5 10*3/uL (ref 4.0–10.5)

## 2016-08-30 MED ORDER — LABETALOL HCL 200 MG PO TABS
400.0000 mg | ORAL_TABLET | Freq: Two times a day (BID) | ORAL | Status: DC
  Administered 2016-08-30 (×2): 400 mg via ORAL
  Filled 2016-08-30 (×2): qty 2

## 2016-08-30 NOTE — Progress Notes (Signed)
PROGRESS NOTE    [name]  ZOX:[id number]  DOB: 1956/03/29  DOA: 09/01/2016 PCP: [name], MD  Hospital course:  [name] is a 61 y.o. female with medical history significant of CHF, GERD, DM comes in with several days of progressive worsening swelling all over and sob. Admitted to SDU for CHF exacerbation requiring bipap therapy.  She reports that about 3 weeks ago she decreased her lasix dosing from twice a day to once a day because it makes her "pee all night".   Assessment & Plan:   1. Acute on chronic respiratory failure with hypoxia - She is chronically on 2L oxygen. Improved with bipap and now weaning back down to nasal cannula.     2. Acute exacerbation of chronic diastolic heart failure - likely exacerbated by noncompliance with diuretic therapy and dietary indiscretions.  Continue IV lasix per cardiology team. Pt is down 35 pounds since admission.  She is still volume overloaded.  Continue to monitor renal function and electrolytes closely.  Echo shows normal ejection fraction.  3. Uncontrolled hypertension. On hydralazine and labetalol. Labetalol dose increased further today   4. Paroxysmal SVT - has been NSR.   5. Severe abdominal distension - [place] negative for ascites, likely related to CHF volume overload.  Some improvement thus far.   6. Constipation - add stool softeners to daily regimen.     7. IDDM - treating with sliding scale coverage and monitor closely. BS well controlled.  8. Anemia of chronic kidney disease - following closely, Hg around 8.  Transfuse for hemoglobin less than 7  9. CKD stage 3 - follow closely on diuretics, creatinine has been improving with diuresis   DVT prophylaxis:  scds Code Status:  full Family Communication:  discussed with aunt at the bedside Disposition Plan:  TBD  Consults called:  cardiology  Subjective: Had some anxiety last night. Overall she is feeling better. Still has significant edema. Still  requiring higher amount of oxygen.   Objective: Vitals:   08/29/16 2130 08/30/16 0321 08/30/16 0501 08/30/16 1253  BP: (!) 169/88  (!) 159/68 (!) 163/84  Pulse: 94  95 95  Resp: 20  20 20   Temp: 98.8 F (37.1 C)  98 F (36.7 C) 98 F (36.7 C)  TempSrc: Oral  Oral Oral  SpO2: 98% 95% 95% 92%  Weight:   115.5 kg (254 lb 9.6 oz)   Height:        Intake/Output Summary (Last 24 hours) at 08/30/16 1741 Last data filed at 08/30/16 1255  Gross per 24 hour  Intake              360 ml  Output             2300 ml  Net            -1940 ml   Filed Weights   08/28/16 0500 08/29/16 0500 08/30/16 0501  Weight: 126 kg (277 lb 12.5 oz) 120.6 kg (265 lb 14 oz) 115.5 kg (254 lb 9.6 oz)    Exam:  General exam: awake, alert, NAD.  Respiratory system. Fine crackles at bases.  No increased work of breathing. Cardiovascular system: S1 & S2 heard.    Gastrointestinal system: Abdomen is distended and tight, nontender. Normal bowel sounds heard. Central nervous system: Alert and oriented. No focal neurological deficits. Extremities: 1+ edema bilateral LEs and upper extremities.  TED Hoses on.    Data Reviewed: Basic Metabolic Panel:  Recent Labs Lab  08/26/16 0500 08/27/16 0409 08/28/16 0412 08/29/16 0434 08/30/16 0510  NA 147* 144 142 142 143  K 3.5 3.9 4.2 4.3 4.6  CL 106 105 102 99* 99*  CO2 35* 34* 34* 34* 37*  GLUCOSE 142* 101* 107* 148* 129*  BUN 22* 26* 26* 23* 21*  CREATININE 1.55* 1.60* 1.49* 1.35* 1.27*  CALCIUM 9.0 8.8* 8.9 9.2 9.2  MG 1.7 1.6*  --   --   --    Liver Function Tests:  Recent Labs Lab 08/11/2016 0035  AST 29  ALT 19  ALKPHOS 197*  BILITOT 0.8  PROT 7.9  ALBUMIN 2.8*   No results for input(s): LIPASE, AMYLASE in the last 168 hours. No results for input(s): AMMONIA in the last 168 hours. CBC:  Recent Labs Lab 08/14/2016 0035  08/26/16 0500 08/27/16 0409 08/28/16 0412 08/29/16 0434 08/30/16 0510  WBC 7.0  --  7.0 5.8 5.3 6.2 6.5  NEUTROABS 4.5   --   --   --   --   --   --   HGB 8.8*  < > 8.0* 7.8* 7.6* 8.2* 7.8*  HCT 27.3*  < > 24.8* 23.8* 24.1* 25.5* 23.9*  MCV 89.8  --  89.9 89.5 90.3 90.4 90.2  PLT 295  --  270 227 249 269 239  < > = values in this interval not displayed. Cardiac Enzymes:  Recent Labs Lab 09/05/2016 0035 08/21/2016 1315  TROPONINI <0.03 <0.03   CBG (last 3)   Recent Labs  08/30/16 0746 08/30/16 1127 08/30/16 1628  GLUCAP 135* 124* 107*   Recent Results (from the past 240 hour(s))  Blood culture (routine x 2)     Status: None   Collection Time: 08/24/2016  3:11 AM  Result Value Ref Range Status   Specimen Description BLOOD RIGHT HAND  Final   Special Requests BOTTLES DRAWN AEROBIC AND ANAEROBIC 6CC  Final   Culture NO GROWTH 5 DAYS  Final   Report Status 08/30/2016 FINAL  Final  Blood culture (routine x 2)     Status: None   Collection Time: 08/30/2016  3:20 AM  Result Value Ref Range Status   Specimen Description BLOOD RIGHT HAND  Final   Special Requests BOTTLES DRAWN AEROBIC AND ANAEROBIC 6CC  Final   Culture NO GROWTH 5 DAYS  Final   Report Status 08/30/2016 FINAL  Final  MRSA PCR Screening     Status: None   Collection Time: 09/06/2016  9:15 AM  Result Value Ref Range Status   MRSA by PCR NEGATIVE NEGATIVE Final    Comment:        The GeneXpert MRSA Assay (FDA approved for NASAL specimens only), is one component of a comprehensive MRSA colonization surveillance program. It is not intended to diagnose MRSA infection nor to guide or monitor treatment for MRSA infections.      Studies: Dg Abd Portable 1v  Result Date: 08/28/2016 CLINICAL DATA:  Abdominal pain EXAM: PORTABLE ABDOMEN - 1 VIEW COMPARISON:  CT scan 02/27/16 FINDINGS: Markedly limited study by patient's large body habitus. Grossly normal small bowel gas pattern. No significant colonic stool noted. Degenerative changes lumbar spine. IMPRESSION: Markedly limited study by patient's large body habitus. Grossly normal small bowel  gas pattern. No significant colonic stool noted. Electronically Signed   By: Natasha MeadLiviu  Pop M.D.   On: 08/28/2016 21:12   Scheduled Meds: . aspirin EC  325 mg Oral Daily  . Chlorhexidine Gluconate Cloth  6 each Topical Q0600  .  FLUoxetine  20 mg Oral BID  . furosemide  40 mg Intravenous BID  . hydrALAZINE  50 mg Oral TID  . insulin aspart  0-9 Units Subcutaneous TID WC  . labetalol  400 mg Oral BID  . mouth rinse  15 mL Mouth Rinse BID  . potassium chloride  60 mEq Oral QHS  . sodium chloride flush  3 mL Intravenous Q12H   Continuous Infusions:  Principal Problem:   Acute on chronic diastolic heart failure (HCC) Active Problems:   IDDM (insulin dependent diabetes mellitus) (HCC)   Essential hypertension   Anemia of chronic disease   CKD (chronic kidney disease), stage III   h/o dialysis needs spring 2017   Paroxysmal SVT (supraventricular tachycardia) (HCC)   History of noncompliance with medical treatment, presenting hazards to health   Pericardial effusion   Hypoalbuminemia   Acute on chronic respiratory failure with hypoxia (HCC)  Time spent: 25 mins  Alcide Memoli, MD [facility] Pager [phone]  If 7PM-7AM, please contact night-coverage [email or web address] Password [facility] 08/30/2016, 5:41 PM    LOS: 5 days

## 2016-08-30 NOTE — Care Management Note (Signed)
Case Management Note  Patient Details  Name: [name] MRN: [id number] Date of Birth: December 28, 1955  Expected Discharge Date:  08/29/16               Expected Discharge Plan:  Home w Home Health Services  In-House Referral:  NA  Discharge planning Services  CM Consult  Post Acute Care Choice:  Home Health Choice offered to:  Patient  HH [facility]  [facility]  Status of Service:  Completed, signed off  Additional Comments: Pt agreeable HH nursing/PT at discharge. Pt would like [facility]. She is aware HH has 48hrs to initiate services. Weekend DC not anticipated. [name][facility] aware of [facility] and will obtain pt info from chart.   [name], Sophia Hosking Demske, RN 08/30/2016, 11:12 AM

## 2016-08-30 NOTE — Care Management Important Message (Signed)
Important Message  Patient Details  Name: [name] MRN: [id number] Date of Birth: [birth year]   Medicare Important Message Given:  Yes    Malcolm MetroChildress, Capri Veals Demske, RN 08/30/2016, 9:55 AM

## 2016-08-30 NOTE — Progress Notes (Signed)
Progress Note  Patient Name: Sophia EllisonGenevieve A Nez Date of Encounter: 08/30/2016  Primary Cardiologist: Dr. Prentice DockerSuresh Koneswaran  Subjective   She continues to feel better in terms of dyspnea. No chest pain or palpitations. Has had some nausea.  Inpatient Medications    Scheduled Meds: . aspirin EC  325 mg Oral Daily  . Chlorhexidine Gluconate Cloth  6 each Topical Q0600  . FLUoxetine  20 mg Oral BID  . furosemide  40 mg Intravenous BID  . hydrALAZINE  50 mg Oral TID  . insulin aspart  0-9 Units Subcutaneous TID WC  . labetalol  200 mg Oral BID  . mouth rinse  15 mL Mouth Rinse BID  . potassium chloride  60 mEq Oral QHS  . sodium chloride flush  3 mL Intravenous Q12H    PRN Meds: sodium chloride, acetaminophen, albuterol, hydrALAZINE, LORazepam, LORazepam, ondansetron (ZOFRAN) IV, prochlorperazine, sodium chloride flush   Vital Signs    Vitals:   08/29/16 1953 08/29/16 2130 08/30/16 0321 08/30/16 0501  BP:  (!) 169/88  (!) 159/68  Pulse:  94  95  Resp:  20  20  Temp:  98.8 F (37.1 C)  98 F (36.7 C)  TempSrc:  Oral  Oral  SpO2: 92% 98% 95% 95%  Weight:    254 lb 9.6 oz (115.5 kg)  Height:        Intake/Output Summary (Last 24 hours) at 08/30/16 0854 Last data filed at 08/29/16 2130  Gross per 24 hour  Intake              240 ml  Output             1300 ml  Net            -1060 ml   Filed Weights   08/28/16 0500 08/29/16 0500 08/30/16 0501  Weight: 277 lb 12.5 oz (126 kg) 265 lb 14 oz (120.6 kg) 254 lb 9.6 oz (115.5 kg)    Telemetry    Sinus rhythm. Personally reviewed.  Physical Exam   GEN: Morbidly obese, no acute distress.   Neck: Mild JVD. Cardiac: Distant, RRR, no gallop.  Respiratory: Nonlabored. Decreased breath sounds. GI:  Obese, bowel sounds present. MS:  Mild hand edema; No deformity.  Labs    Chemistry Recent Labs Lab 01-28-2017 0035  08/28/16 0412 08/29/16 0434 08/30/16 0510  NA 142  < > 142 142 143  K 3.7  < > 4.2 4.3 4.6  CL  103  < > 102 99* 99*  CO2 32  < > 34* 34* 37*  GLUCOSE 130*  < > 107* 148* 129*  BUN 21*  < > 26* 23* 21*  CREATININE 1.39*  < > 1.49* 1.35* 1.27*  CALCIUM 8.8*  < > 8.9 9.2 9.2  PROT 7.9  --   --   --   --   ALBUMIN 2.8*  --   --   --   --   AST 29  --   --   --   --   ALT 19  --   --   --   --   ALKPHOS 197*  --   --   --   --   BILITOT 0.8  --   --   --   --   GFRNONAA 40*  < > 37* 41* 45*  GFRAA 46*  < > 43* 48* 52*  ANIONGAP 7  < > 6 9 7   < > =  values in this interval not displayed.   Hematology  Recent Labs Lab 08/28/16 0412 08/29/16 0434 08/30/16 0510  WBC 5.3 6.2 6.5  RBC 2.67* 2.82* 2.65*  HGB 7.6* 8.2* 7.8*  HCT 24.1* 25.5* 23.9*  MCV 90.3 90.4 90.2  MCH 28.5 29.1 29.4  MCHC 31.5 32.2 32.6  RDW 17.8* 17.6* 17.7*  PLT 249 269 239    Cardiac Enzymes  Recent Labs Lab 2016/09/23 0035 09/23/2016 1315  TROPONINI <0.03 <0.03     Recent Labs Lab 09/23/2016 0054  TROPIPOC 0.00     BNP  Recent Labs Lab September 23, 2016 0035  BNP 1,208.0*     Radiology    Dg Abd Portable 1v  Result Date: 08/28/2016 CLINICAL DATA:  Abdominal pain EXAM: PORTABLE ABDOMEN - 1 VIEW COMPARISON:  CT scan 02/27/16 FINDINGS: Markedly limited study by patient's large body habitus. Grossly normal small bowel gas pattern. No significant colonic stool noted. Degenerative changes lumbar spine. IMPRESSION: Markedly limited study by patient's large body habitus. Grossly normal small bowel gas pattern. No significant colonic stool noted. Electronically Signed   By: Natasha Mead M.D.   On: 08/28/2016 21:12    Cardiac Studies   Echocardiogram 08/26/2016: Study Conclusions  - Left ventricle: The cavity size was normal. Wall thickness was   increased in a pattern of moderate LVH. Systolic function was   normal. The estimated ejection fraction was in the range of 60%   to 65%. Wall motion was normal; there were no regional wall   motion abnormalities. - Aortic valve: Mildly calcified annulus.  Mildly thickened   leaflets. - Mitral valve: Mildly calcified annulus. Mildly thickened leaflets. - Right ventricle: The cavity size was moderately dilated. - Right atrium: The atrium was moderately dilated. - Atrial septum: The septum bowed from right to left, consistent   with increased right atrial pressure. - Pericardium, extracardiac: A trivial to small circumferential   pericardial effusion was identified.  Patient Profile     61 y.o. female morbid obesity, chronic respiratory failure on home O2 (pt states she does not know what for), PSVT noted in 2017 hospital admission briefly, IDDM, HTN, HLD, chronic-appearing anemia, CKD stage III with variable Cr in Epic, recurrent skin abscesses with prior sepsis, GERD, depression, anxiety, tobacco abuse, habitual noncompliance who presented to [facility] with worsening SOB/diffuse body edema, 65lb weight gain since 06/2016 concerning for a/c diastolic CHF. Has been eating high sodium foods, drinking excess fluid, and not taking her medication as prescribed. Prior DC weight 237lb, admitted at 300lb.  Assessment & Plan    1. Acute on chronic diastolic heart failure, LVEF 60-65%, also right ventricular dysfunction. She remains on IV Lasix with continued diuresis and weight loss, down from 300 pounds to 254 pounds. Still about 15 pounds over suspected baseline weight.  2. CKD, stage 3. Follow-up creatinine is improving even with diuresis, down to 1.27.  3. History of noncompliance with low sodium diet and fluid restriction. This has been reinforced.  4. History of PSVT, no recurrences.  6. Essential hypertension, systolic blood pressure 150s to 170s. On hydralazine and Labetalol (new).  Continue current dose of IV Lasix as she continues to diurese with improving renal function and weight reduction. Still approximately 15 pounds over her suspected baseline. Will increase Labetalol dose further for better blood pressure control. Likely need to continue IV  diuresis through the weekend and then convert back to [facility] when closer to baseline.   Signed, Nona Dell, MD  08/30/2016, 8:54 AM

## 2016-08-30 NOTE — Progress Notes (Signed)
qPhysical Therapy Treatment Patient Details Name: Sophia EllisonGenevieve A Mayden MRN: [id number] DOB: [birth year] Today's Date: 08/30/2016    History of Present Illness 61 y.o. female with medical history significant of CHF, GERD, DM comes in with several days of progressive worsening swelling all over and sob.  She is on 2 liters oxygen at home, and had to increase this up to 5 liters the last day or so due to sob.  She denies any fevers, no cough.  She reports that about 3 weeks ago she decreased her lasix dosing from twice a day to once a day because it makes her "pee all night".  She was not swollen at all then.  About 4 days ago she started to swell more.  She does not check her weight at all as she does not have a scale at home to do so.  She denies chest pain.  Pt found to be grossly volume overloaded and in chf exacerbation in the ED.  Hypoxic also.  Treated for pna, copde and chf.  Referred for admission for treatment of her resp distress, she has been placed on bipap.  Dx: Acute exacerbation of chronic diastolic heart failure - likely exacerbated by noncompliance with diuretic therapy and dietary indiscretions, and acute on chronic respiratory failure.     PT Comments    Pt received in bed, and was agreeable to PT tx.  Pt expressed that she had been sitting up in the chair earlier today.  During PT, she was able to ambulate  6820ft with RW again today, however she desaturates to 84% while on 8L of HFNC.  She was also able to participate in exercises today.  Her mobility is greatly limited by her fatigue and SpO2 desaturation.  Continue to recommend HHPT with 24/7 supervision/assistance.    Follow Up Recommendations  Home health PT;Supervision/Assistance - 24 hour     Equipment Recommendations  None recommended by PT    Recommendations for Other Services       Precautions / Restrictions Precautions Precautions: Fall Precaution Comments: Due to immobility, and pt reports that she has a fear of  falling. Pt reported today that she fell ~1 month ago Restrictions Weight Bearing Restrictions: No    Mobility  Bed Mobility Overal bed mobility: Needs Assistance Bed Mobility: Supine to Sit     Supine to sit: Min assist;HOB elevated        Transfers Overall transfer level: Needs assistance   Transfers: Sit to/from Stand Sit to Stand: Max assist         General transfer comment: Pt needs increased encouragement today to perform sit<>stand transfer.  Pt has difficulty with anterior weight shift due to body habitus, and breathign difficulties.  She also does not use UE's and LE's together well when going to push up.    Ambulation/Gait Ambulation/Gait assistance: Min guard Ambulation Distance (Feet): 20 Feet Assistive device: Rolling walker (2 wheeled) Gait Pattern/deviations: Step-to pattern;Wide base of support   Gait velocity interpretation: <1.8 ft/sec, indicative of risk for recurrent falls General Gait Details: slow cadence with 3 standing rest breaks with tripoding over RW during ambulation.  Pt desaturated to 84% while on 8L of HFNC.  Increased to 10L due to poor ability to recover, then decreased back to 8L when resting and able to saturate at 92%   Stairs            Wheelchair Mobility    Modified Rankin (Stroke Patients Only)       Balance  Overall balance assessment: Needs assistance Sitting-balance support: Bilateral upper extremity supported;Feet supported Sitting balance-Leahy Scale: Good     Standing balance support: Bilateral upper extremity supported Standing balance-Leahy Scale: Fair                              Cognition Arousal/Alertness: Awake/alert Behavior During Therapy: WFL for tasks assessed/performed Overall Cognitive Status: Within Functional Limits for tasks assessed                                        Exercises General Exercises - Upper Extremity Shoulder Flexion: AROM;Both;10  reps;Seated General Exercises - Lower Extremity Long Arc Quad: AROM;Both;10 reps;Seated Other Exercises Other Exercises: scapular retraction x 10 reps with tactile cues to improve technique.     General Comments        Pertinent Vitals/Pain Pain Assessment: No/denies pain    Home Living                      Prior Function            PT Goals (current goals can now be found in the care plan section)      Frequency    Min 3X/week      PT Plan Current plan remains appropriate    Co-evaluation             End of Session Equipment Utilized During Treatment: Gait belt;Oxygen Activity Tolerance: Patient limited by fatigue Patient left: in chair;with call bell/phone within reach Nurse Communication: Mobility status PT Visit Diagnosis: Muscle weakness (generalized) (M62.81);Other abnormalities of gait and mobility (R26.89)     Time: 1610-9604 PT Time Calculation (min) (ACUTE ONLY): 26 min  Charges:  $Gait Training: 8-22 mins $Therapeutic Exercise: 8-22 mins                    G Codes:       Beth Casia Corti, PT, DPT X: [phone]

## 2016-08-31 ENCOUNTER — Inpatient Hospital Stay (HOSPITAL_COMMUNITY): Payer: BLUE CROSS/BLUE SHIELD

## 2016-08-31 LAB — GLUCOSE, CAPILLARY: Glucose-Capillary: 140 mg/dL — ABNORMAL HIGH (ref 65–99)

## 2016-08-31 MED ORDER — ATROPINE SULFATE 1 MG/ML IJ SOLN
INTRAMUSCULAR | Status: AC
  Filled 2016-08-31: qty 3

## 2016-08-31 MED ORDER — ATROPINE SULFATE 1 MG/ML IJ SOLN
INTRAMUSCULAR | Status: AC
  Filled 2016-08-31: qty 2

## 2016-08-31 MED ORDER — EPINEPHRINE PF 1 MG/ML IJ SOLN
INTRAMUSCULAR | Status: AC
  Filled 2016-08-31: qty 1

## 2016-08-31 MED ORDER — EPINEPHRINE PF 1 MG/ML IJ SOLN
INTRAMUSCULAR | Status: AC
  Filled 2016-08-31: qty 4

## 2016-08-31 MED ORDER — EPINEPHRINE PF 1 MG/10ML IJ SOSY
PREFILLED_SYRINGE | INTRAMUSCULAR | Status: AC
  Filled 2016-08-31: qty 30

## 2016-08-31 MED ORDER — ATROPINE SULFATE 1 MG/ML IJ SOLN
INTRAMUSCULAR | Status: AC
  Filled 2016-08-31: qty 1

## 2016-08-31 MED ORDER — ATROPINE SULFATE 1 MG/ML IJ SOLN
INTRAMUSCULAR | Status: AC
  Filled 2016-08-31: qty 4

## 2016-09-02 MED FILL — Medication: Qty: 1 | Status: AC

## 2016-09-08 NOTE — Progress Notes (Signed)
Arrived in code blue after alert for rapid response approximately 1 minute before. Arrived at approximately 0505 am. Pt became unresponsive in front of floor RN, Found to be bradycardic in the 30s and went into PEA arrest.  Dr Criss Alvinegoldston intubated pt as first physician in room.  ACLS protocol was followed. Pt was given atropin and pulse was recovered with HR up to 60. Then became pulseless.   CPR was done as indicated throughout.  Please see code sheet.Pt was declared expired at approximately 545pm by attending hospitalist.

## 2016-09-08 NOTE — Death Summary Note (Signed)
DEATH SUMMARY   Patient Details  Name: Sophia EllisonGenevieve A Atwater MRN: [id number] DOB: 1956/06/04  Admission/Discharge Information   Admit Date:  08-10-16  Date of Death: Date of Death: 08/26/2016  Time of Death: Time of Death: 0545  Length of Stay: 6  Referring Physician: Louie BostonAPPER,[name], MD   Reason(s) for Hospitalization  Acute on chronic diastolic congestive heart failure  Diagnoses  Preliminary cause of death:  bradycardia leading to pulseless electrical activity Secondary Diagnoses (including complications and co-morbidities):  Principal Problem:   Acute on chronic diastolic heart failure (HCC) Active Problems:   IDDM (insulin dependent diabetes mellitus) (HCC)   Essential hypertension   Anemia of chronic disease   CKD (chronic kidney disease), stage III   h/o dialysis needs spring 2017   Paroxysmal SVT (supraventricular tachycardia) (HCC)   History of noncompliance with medical treatment, presenting hazards to health   Pericardial effusion   Hypoalbuminemia   Acute on chronic respiratory failure with hypoxia [facility](HCC)   Brief Hospital Course (including significant findings, care, treatment, and services provided and events leading to death)  [name] is a 61 y.o. year old female who Has a history of diastolic congestive heart failure, presented to the hospital with worsening shortness of breath and generalized edema. She initially required BiPAP therapy was admitted to the stepdown unit. It was reported that approximately 3 weeks ago, she had decreased her Lasix dose from twice a day to once daily, since she was up all night urinating. The patient was started on intravenous Lasix and had excellent diuresis. Over the course of her stay, her weight came down approximately 35 pounds. She was followed by cardiology during her hospital stay. She continued to have evidence of volume overload needing further IV diuresis. On 3/24, patient had been doing well overnight, when she  suddenly got up to use the bathroom and became unresponsive. She was noted to be bradycardic which progressed to pulseless electrical activity. She underwent CPR, but could not maintain ROSC. She was pronounced deceased at 5:45 AM. Family was at the bedside and was provided support.    Pertinent Labs and Studies  Significant Diagnostic Studies Dg Chest 1 View  Result Date: 08-10-16 CLINICAL DATA:  PICC line adjustment. EXAM: CHEST 1 VIEW COMPARISON:  003-03-18 FINDINGS: Right-sided PICC line tip overlies the expected location of distal superior vena cava. Cardiomediastinal silhouette is enlarged. There is no evidence of pneumothorax. Worsening aeration of the lungs with bilateral lower lobe interstitial and alveolar opacities. Osseous structures are without acute abnormality. Soft tissues are grossly normal. IMPRESSION: Enlarged cardiac silhouette. Worsening aeration of the lungs with bilateral lower lobe predominant interstitial and alveolar opacities suggestive of development of pulmonary edema. PICC line tip overlies the expected location of distal superior vena cava. Electronically Signed   By: Ted Mcalpineobrinka  Dimitrova M.D.   On: 003-03-18 17:54   Dg Chest 1 View  Result Date: 08-10-16 CLINICAL DATA:  PICC line placement. EXAM: CHEST 1 VIEW COMPARISON:  003-03-18 and 06/13/2016 FINDINGS: Interval placement of right-sided PICC line as tip is somewhat difficult to visualize but appears in the region of the cavoatrial junction or right atrium. Lungs are hypoinflated with persistent opacification over the right midlung unchanged. Mild prominence of the perihilar markings. Stable cardiomegaly. Remainder of the exam is unchanged. IMPRESSION: Stable right midlung opacification suggesting atelectasis/scarring. Evidence of mild vascular congestion and stable cardiomegaly. Right-sided PICC line with tip difficult to visualize although appears at the level of the cavoatrial junction or right atrium.  Recommend  retracting approximately 3 cm and reimaging. These results were called by telephone at the time of interpretation on 09-16-2016 at 3:36 pm to patient's nurse, Huey Romans, who verbally acknowledged these results. Electronically Signed   By: Elberta Fortis M.D.   On: 09-16-16 15:36   US Abdomen Limited  Result Date: 08/26/2016 CLINICAL DATA:  Ascites. EXAM: LIMITED ABDOMEN ULTRASOUND FOR ASCITES TECHNIQUE: Limited ultrasound survey for ascites was performed in all four abdominal quadrants. COMPARISON:  CT 02/27/2016 FINDINGS: Ultrasound performed in all 4 quadrants to assess for ascites. There is a small amount of ascites noted, best seen in the left upper and lower quadrants. IMPRESSION: Small amount of left abdominal ascites. This is not sufficient to tap. Electronically Signed   By: Charlett Nose M.D.   On: 08/26/2016 10:19   Dg Chest Portable 1 View  Result Date: 09/16/2016 CLINICAL DATA:  Shortness of breath EXAM: PORTABLE CHEST 1 VIEW COMPARISON:  Chest radiograph 06/13/2016 FINDINGS: Examination is degraded by poor penetration secondary to large body habitus. Cardiomegaly is unchanged. There are medial right lower lung opacities. IMPRESSION: 1. Examination severely limited by poor penetration secondary to body habitus. 2. Medial right lower lung opacities may indicate developing consolidation. Electronically Signed   By: Deatra Robinson M.D.   On: 16-Sep-2016 01:56   Dg Abd Portable 1v  Result Date: 08/28/2016 CLINICAL DATA:  Abdominal pain EXAM: PORTABLE ABDOMEN - 1 VIEW COMPARISON:  CT scan 02/27/16 FINDINGS: Markedly limited study by patient's large body habitus. Grossly normal small bowel gas pattern. No significant colonic stool noted. Degenerative changes lumbar spine. IMPRESSION: Markedly limited study by patient's large body habitus. Grossly normal small bowel gas pattern. No significant colonic stool noted. Electronically Signed   By: Natasha Mead M.D.   On: 08/28/2016 21:12     Microbiology Recent Results (from the past 240 hour(s))  Blood culture (routine x 2)     Status: None   Collection Time: 09-16-16  3:11 AM  Result Value Ref Range Status   Specimen Description BLOOD RIGHT HAND  Final   Special Requests BOTTLES DRAWN AEROBIC AND ANAEROBIC 6CC  Final   Culture NO GROWTH 5 DAYS  Final   Report Status 08/30/2016 FINAL  Final  Blood culture (routine x 2)     Status: None   Collection Time: 2016/09/16  3:20 AM  Result Value Ref Range Status   Specimen Description BLOOD RIGHT HAND  Final   Special Requests BOTTLES DRAWN AEROBIC AND ANAEROBIC 6CC  Final   Culture NO GROWTH 5 DAYS  Final   Report Status 08/30/2016 FINAL  Final  MRSA PCR Screening     Status: None   Collection Time: September 16, 2016  9:15 AM  Result Value Ref Range Status   MRSA by PCR NEGATIVE NEGATIVE Final    Comment:        The GeneXpert MRSA Assay (FDA approved for NASAL specimens only), is one component of a comprehensive MRSA colonization surveillance program. It is not intended to diagnose MRSA infection nor to guide or monitor treatment for MRSA infections.     Lab Basic Metabolic Panel:  Recent Labs Lab 08/26/16 0500 08/27/16 0409 08/28/16 0412 08/29/16 0434 08/30/16 0510  NA 147* 144 142 142 143  K 3.5 3.9 4.2 4.3 4.6  CL 106 105 102 99* 99*  CO2 35* 34* 34* 34* 37*  GLUCOSE 142* 101* 107* 148* 129*  BUN 22* 26* 26* 23* 21*  CREATININE 1.55* 1.60* 1.49* 1.35* 1.27*  CALCIUM 9.0 8.8* 8.9 9.2 9.2  MG 1.7 1.6*  --   --   --    Liver Function Tests:  Recent Labs Lab 08/16/2016 0035  AST 29  ALT 19  ALKPHOS 197*  BILITOT 0.8  PROT 7.9  ALBUMIN 2.8*   No results for input(s): LIPASE, AMYLASE in the last 168 hours. No results for input(s): AMMONIA in the last 168 hours. CBC:  Recent Labs Lab 08/26/2016 0035  08/26/16 0500 08/27/16 0409 08/28/16 0412 08/29/16 0434 08/30/16 0510  WBC 7.0  --  7.0 5.8 5.3 6.2 6.5  NEUTROABS 4.5  --   --   --   --   --   --    HGB 8.8*  < > 8.0* 7.8* 7.6* 8.2* 7.8*  HCT 27.3*  < > 24.8* 23.8* 24.1* 25.5* 23.9*  MCV 89.8  --  89.9 89.5 90.3 90.4 90.2  PLT 295  --  270 227 249 269 239  < > = values in this interval not displayed. Cardiac Enzymes:  Recent Labs Lab 09/07/2016 0035 09/06/2016 1315  TROPONINI <0.03 <0.03   Sepsis Labs:  Recent Labs Lab 08/20/2016 0126 08/19/2016 1315  08/27/16 0409 08/28/16 0412 08/29/16 0434 08/30/16 0510  PROCALCITON  --  <0.10  --   --   --   --   --   WBC  --   --   < > 5.8 5.3 6.2 6.5  LATICACIDVEN 1.84  --   --   --   --   --   --   < > = values in this interval not displayed.  Procedures/Operations  Echo: - Left ventricle: The cavity size was normal. Wall thickness was   increased in a pattern of moderate LVH. Systolic function was   normal. The estimated ejection fraction was in the range of 60%   to 65%. Wall motion was normal; there were no regional wall   motion abnormalities. - Aortic valve: Mildly calcified annulus. Mildly thickened   leaflets. - Mitral valve: Mildly calcified annulus. Mildly thickened leaflets   . - Right ventricle: The cavity size was moderately dilated. - Right atrium: The atrium was moderately dilated. - Atrial septum: The septum bowed from right to left, consistent   with increased right atrial pressure. - Pericardium, extracardiac: A trivial to small circumferential   pericardial effusion was identified.   MEMON,JEHANZEB 11-Sep-2016, 6:54 PM

## 2016-09-08 NOTE — ED Provider Notes (Addendum)
5:23 AM Called to floor for a CODE BLUE. Nurse witnessed the patient become unresponsive. No pulse. CPR started, after 1 round had pulses back with bradycardic rhythm. After this, no pulse found. CPR resumed for multiple rounds. Remained bradycardic. Pulse regained. 2 epinephrines and atropine given. After ROSC patient was intubated as below. Dr. Shanon Brow, hospitalist, available and took over care. Patient transferred to ICU.   Cardiopulmonary Resuscitation (CPR) Procedure Note Directed/Performed by: Ephraim Hamburger I personally directed ancillary staff and/or performed CPR in an effort to regain return of spontaneous circulation and to maintain cardiac, neuro and systemic perfusion.    INTUBATION Performed by: Sherwood Gambler T  Required items: required blood products, implants, devices, and special equipment available Patient identity confirmed: provided demographic data and hospital-assigned identification number Time out: Immediately prior to procedure a "time out" was called to verify the correct patient, procedure, equipment, support staff and site/side marked as required.  Indications: post-cpr, respiratory failure  Intubation method: Direct laryngoscopy, Mac 3   Preoxygenation: BVM  Sedatives: None Paralytic: None  Tube Size: 7.5 cuffed, 24 at lips  Post-procedure assessment: chest rise and ETCO2 monitor Breath sounds: equal and absent over the epigastrium Tube secured with: ETT holder   Chest x-ray pending.   Patient tolerated the procedure well with no immediate complications.      Sherwood Gambler, MD 2016/09/29 [place], MD 2016/09/29 [phone]

## 2016-09-08 NOTE — Progress Notes (Signed)
Patient ID: [name], female   DOB: March 25, 1956, 61 y.o.   MRN: [id number]   A little after 5am pt became unresponsive in front of floor RN, she had not been complaining of anything, did not complain of anything prior to going unresponsive.   rapid response was called then soon thereafter a code blue.  She was bradycardic in the 30s and went into PEA arrest.  EDP dr Criss Alvinegoldston first responded to bedside, I soon thereafter responded.  ACLS protocol was followed.  She was intubated successfully by EDP dr Criss Alvinegoldston, Initially pt was given atropin and pulse was recovered with HR up to 60 but then she soon became bradycardic again and pulseless.  CPR was done throughout.  Please see nursing code sheet documentation for details.  Dopamine drip was initiated.  Pt was given mag sulf 1gm,  1 amp of bicarb, many doses of atropine, many doses of epinephrine.  After approximately 30 minutes of not recovering any pulse, pt was pronounced expired at approximately 545pm.    Her aunt was with her all night and when she became unresponsive, per aunt she had been resting comfortable all night and then was trying to get up to go to the bathroom, RN was called as pt was trying to get out of bed.  Upon arrival of RN to room she became suddenly unresponsive as described above.     PE no pulse, no bp, no breathing with ETT in good position No corneal reflex Cards no heart sounds resp none Ext still with some edema to ble but much improved from when I admitted pt several nights ago [place] still also with dependent edema but again much improved from when I admitted her several nights ago  Cause of cardiac arrest unclear, suspect major coronary event.  No labs where able to be obtained during code, she did have a glucose of 140 however and was not hypoglycemic.    Cc time 45 minutes

## 2016-09-08 NOTE — Progress Notes (Signed)
Eye care completed per protocol since patient [facility]. Family notified nursing staff of funeral home preference, [employer] [place], [place] prior to them leaving this am. Pt to be transported to morgue while awaiting funeral home per their request. Postmortem care completed, foley and PICC line removed, both intact. Pt transported to morgue. Death certificate left on table in morgue with sign-in log. Earnstine RegalAshley Vannesa Abair, RN

## 2016-09-08 DEATH — deceased

## 2017-06-25 IMAGING — CT CT HEAD W/O CM
3 series · 16 of 47 positions shown, 19 images · non-contrast
Comparison: None.

CLINICAL DATA: Painful abscesses of the back and neck. Elevated
creatinine.

EXAM:
CT HEAD WITHOUT CONTRAST
TECHNIQUE: Contiguous axial images were obtained from the base of the skull
through the vertex without intravenous contrast.

[Series 2: head wo · axial · 0.40mm/px · z∈[+774,+899]mm · 10 of 31 slices shown, 13 images]
[im 3/31  brain]
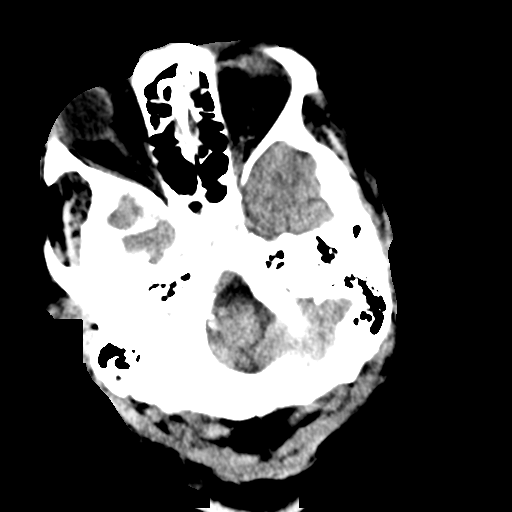
[im 3/31  bone]
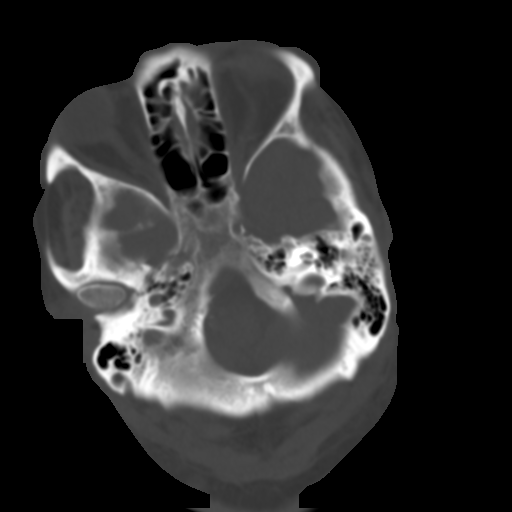
[im 6/31  brain]
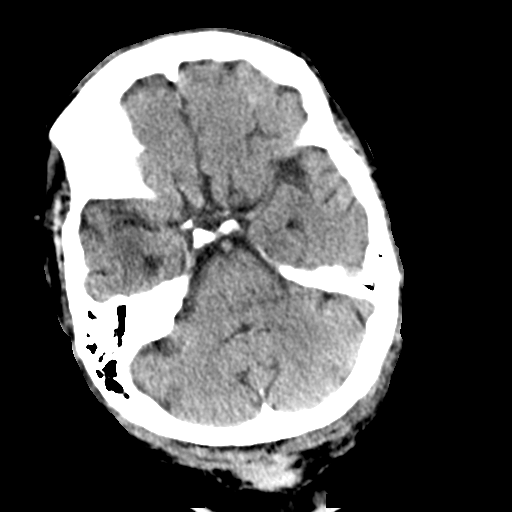
[im 9/31  brain]
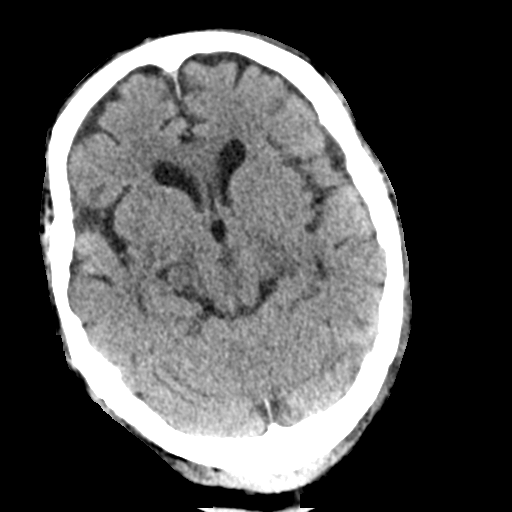
[im 11/31  brain]
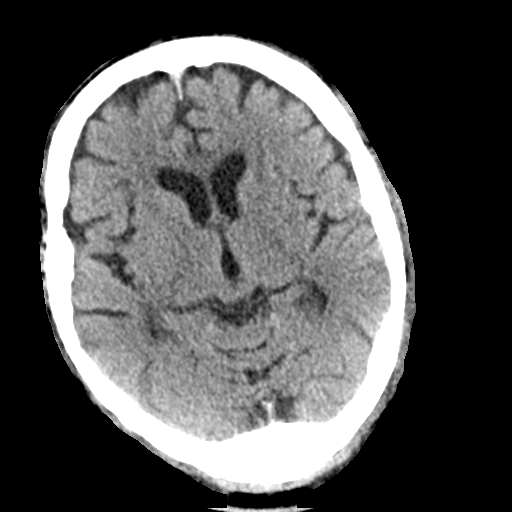
[im 14/31  brain]
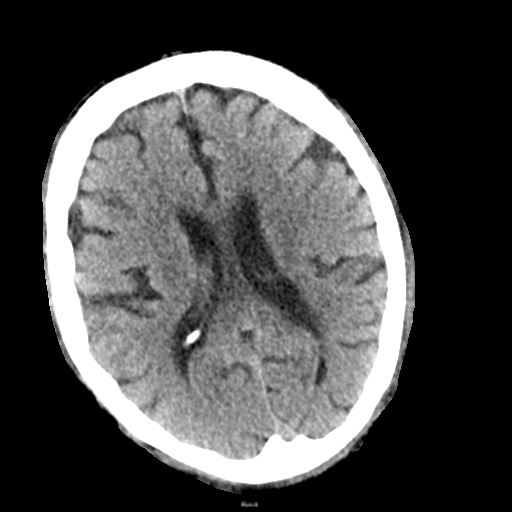
[im 14/31  bone]
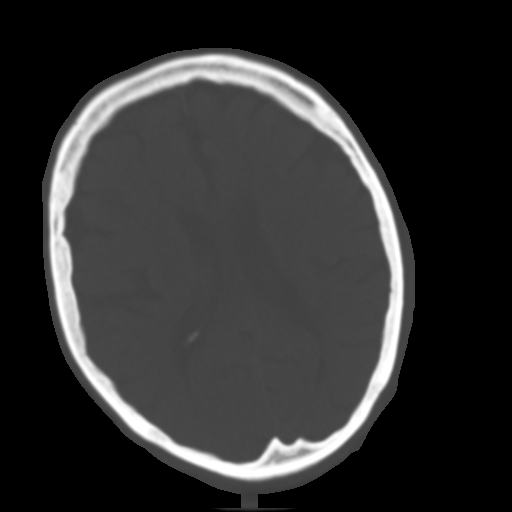
[im 17/31  brain]
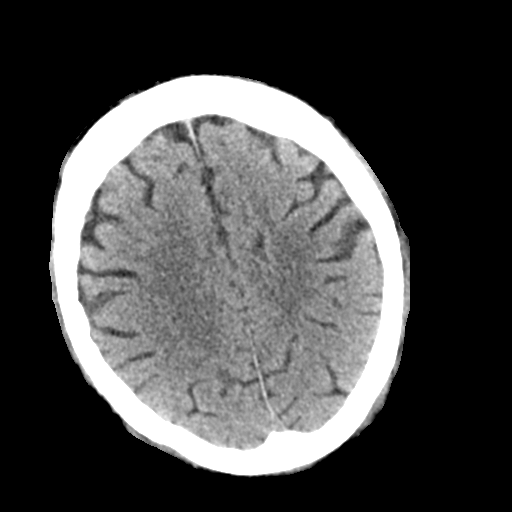
[im 20/31  brain]
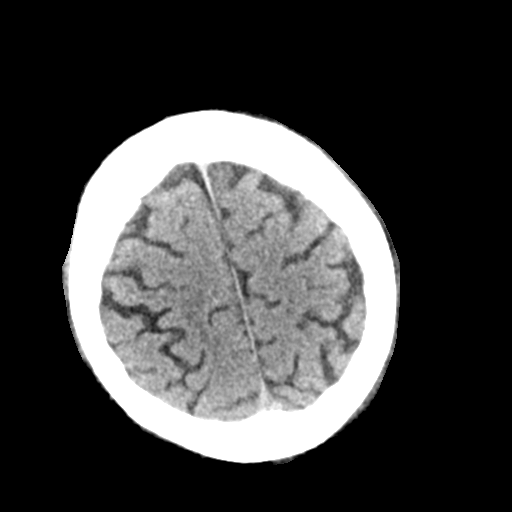
[im 23/31  brain]
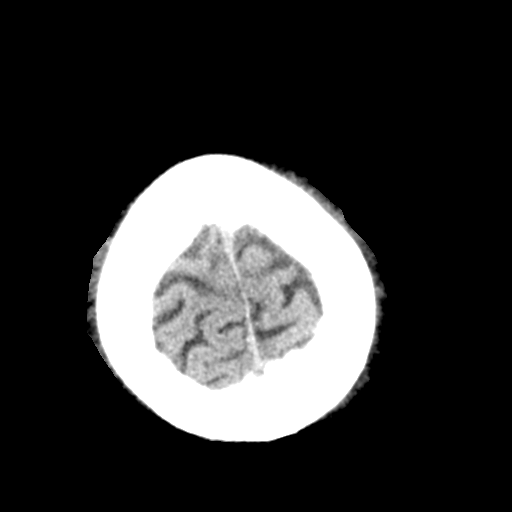
[im 25/31  brain]
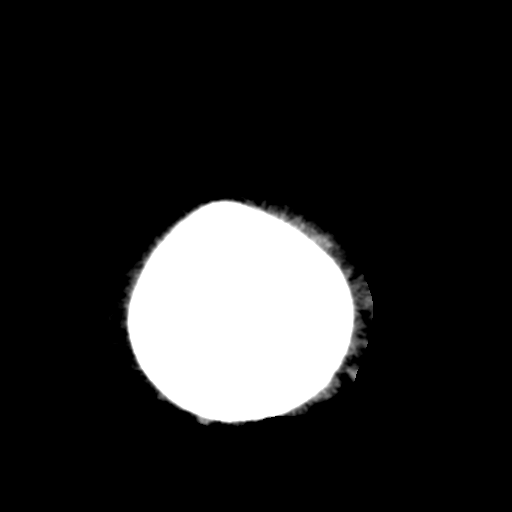
[im 25/31  bone]
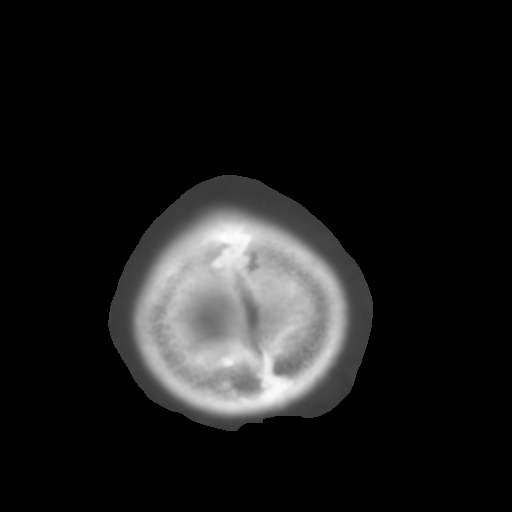
[im 28/31  brain]
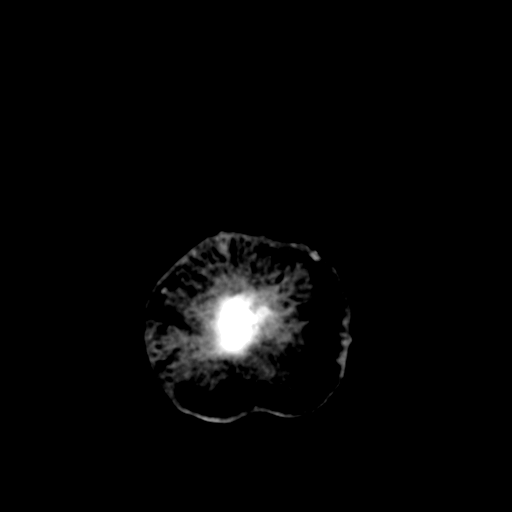

[Series 4: coronal soft tissue · coronal · 0.30mm/px · 3 of 66 slices shown]
[im 22/66  brain]
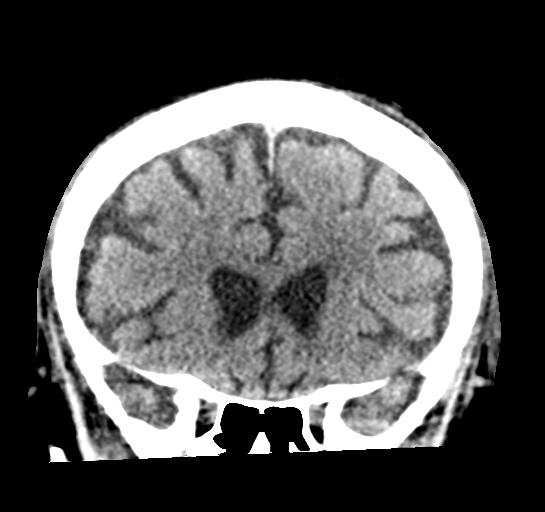
[im 29/66  brain]
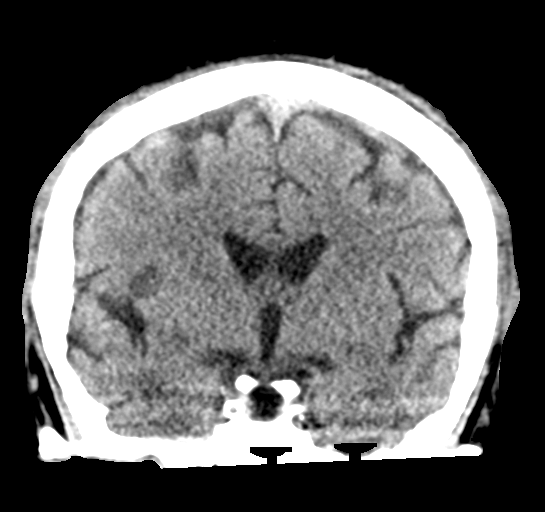
[im 37/66  brain]
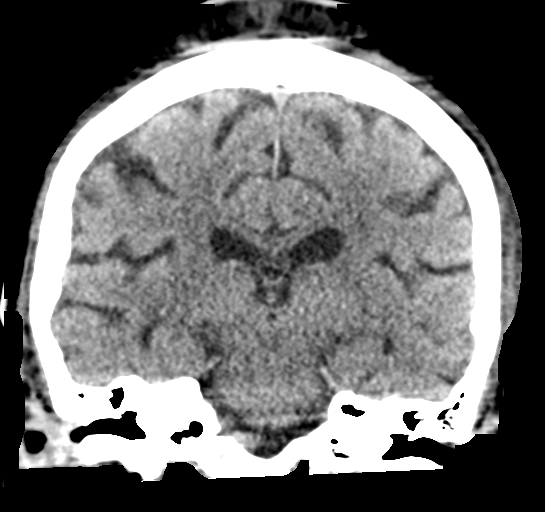

[Series 5: sagittal soft tissue · sagittal · 0.32mm/px · 3 of 55 slices shown]
[im 19/55  brain]
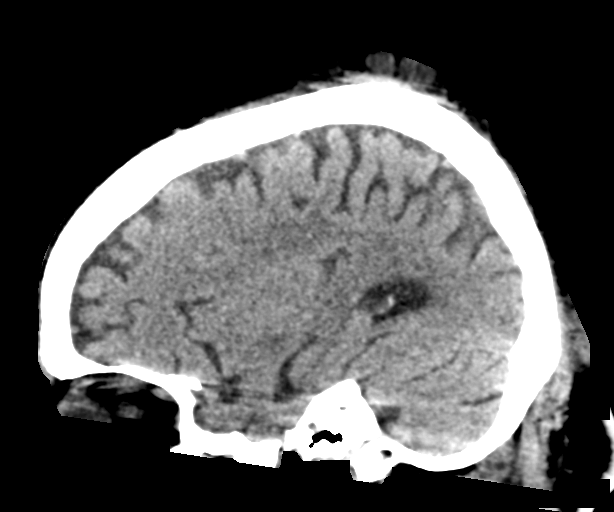
[im 28/55  brain]
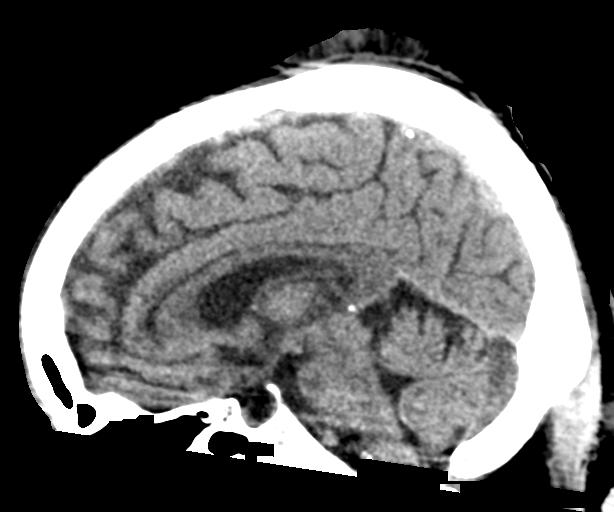
[im 37/55  brain]
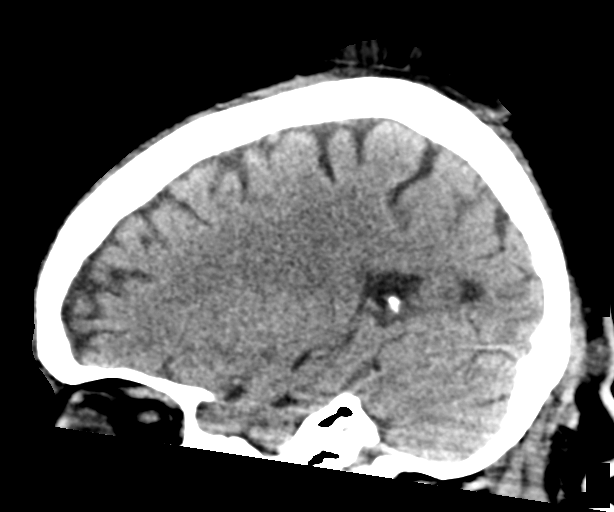

[16 of 47 positions shown; findings below may reference images not displayed]

FINDINGS: Brain: Normal appearance of the brain without evidence of old or
acute infarction, mass lesion, hemorrhage, hydrocephalus or
extra-axial collection.

Vascular: There is atherosclerotic calcification of the major
vessels at the base of the brain.

Skull: Normal

Sinuses/Orbits: Normal

Other: Few areas of swelling of the scalp which could be infectious
inflammation. No large or obviously drainable scalp collection.
IMPRESSION: Multiple areas of density in the scalp consistent with foci of soft
tissue inflammation. None of these appear large or definitely
low-density to suggest drainable collection.

No intracranial abnormality.

## 2017-07-08 IMAGING — DX DG CHEST 2V
2 series · 2 of 2 positions shown · non-contrast
Comparison: 02/27/2016

CLINICAL DATA: PICC line placement.  Tobacco use.

EXAM:
CHEST  2 VIEW

[chest pa]
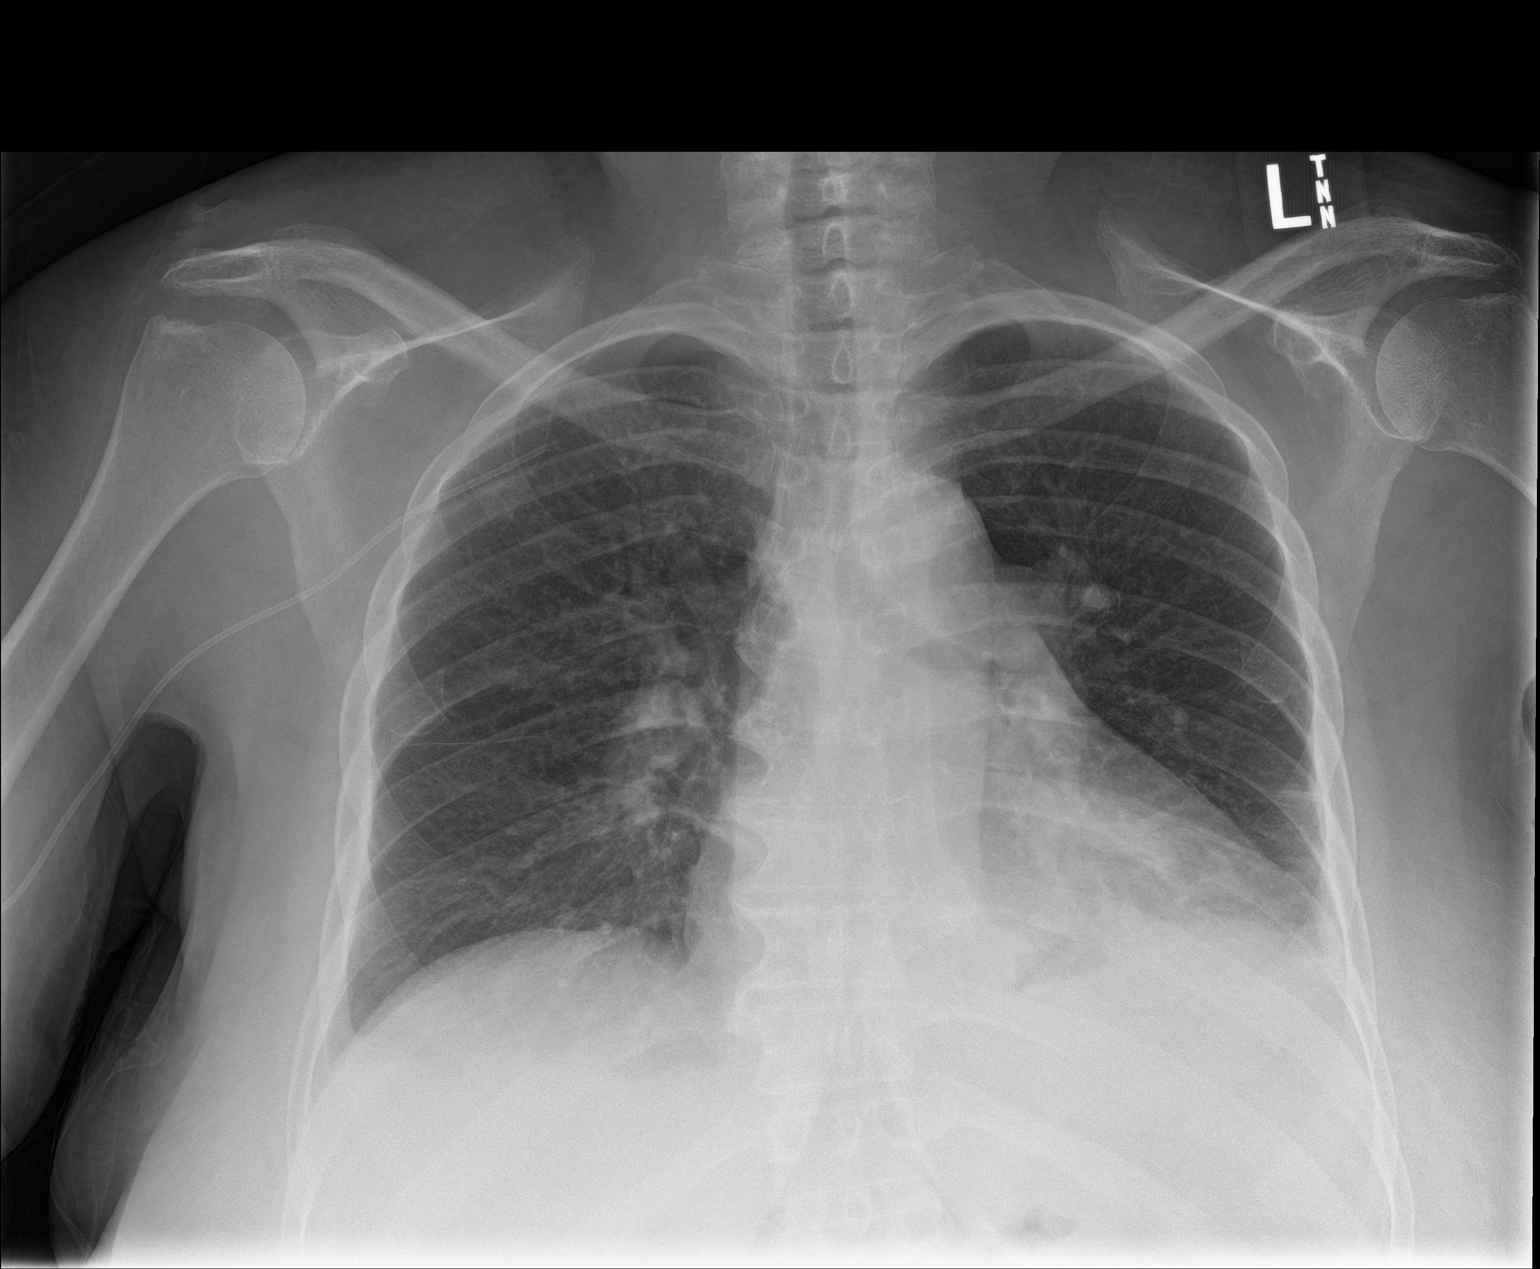

[chest lat]
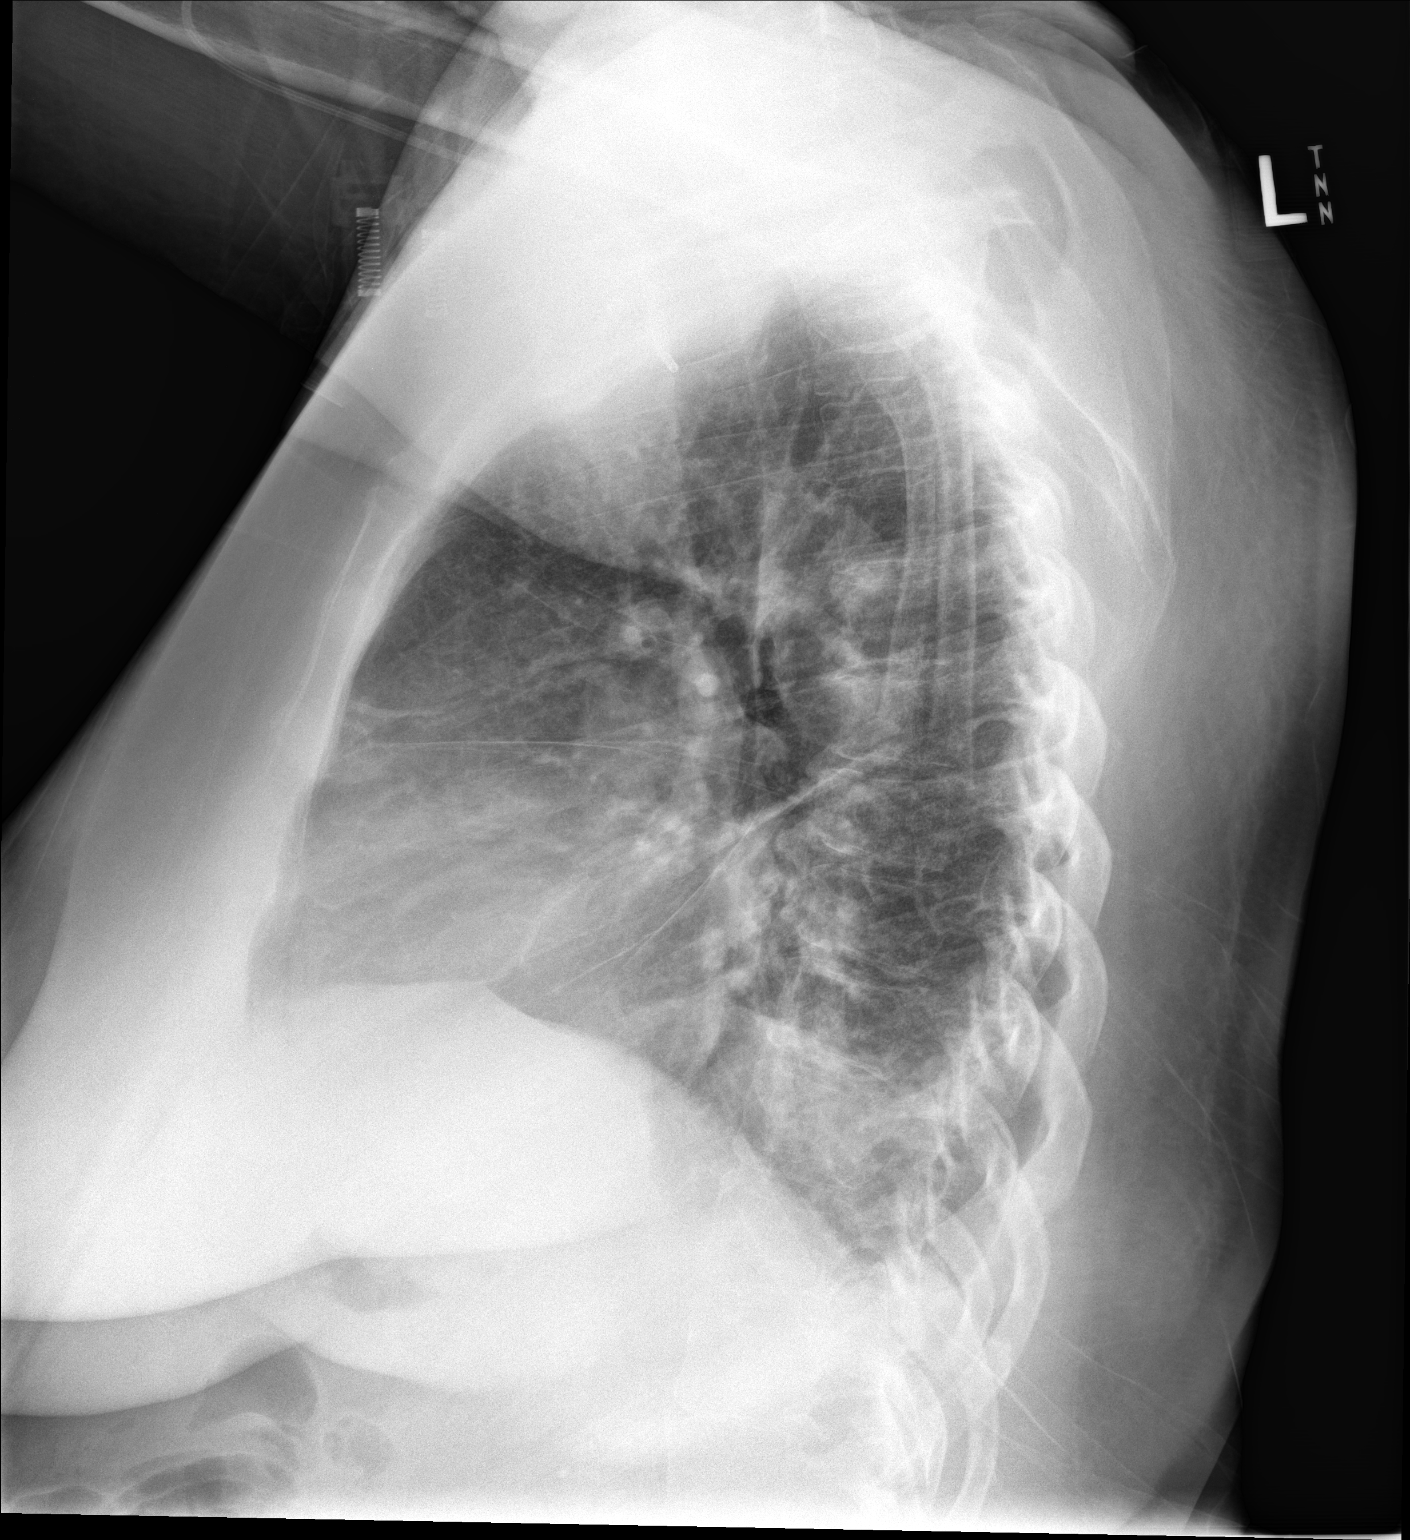

[2 of 2 positions shown; findings below may reference images not displayed]

FINDINGS: The tip of the right-sided PICC line projects over the subclavian
vein. If lower SVC placement is desired, advance 13 cm.

Airspace opacity in the left lower lobe. Blunting of the left
lateral costophrenic angle. Low lung volumes. Thoracic spondylosis.
Atherosclerotic calcification of the aortic arch.
IMPRESSION: 1. Continued mild airspace opacity in the left lower lobe with
suspected left pleural effusion.
2. Right-sided PICC line tip: Right subclavian vein. If lower SVC
placement is desired, advanced 13 cm.

## 2017-07-08 IMAGING — CR DG CHEST 1V PORT
1 series · 2 of 2 positions shown · non-contrast
Comparison: Portable exam 0777 hours compared to earlier study of
[DATE] hours

CLINICAL DATA: RIGHT-side PICC line placement, diabetes mellitus,
hypertension, smoker, GERD

EXAM:
PORTABLE CHEST 1 VIEW

[Series 1: ap · 0.17mm/px · 2 of 2 slices shown]
[im 1/2]
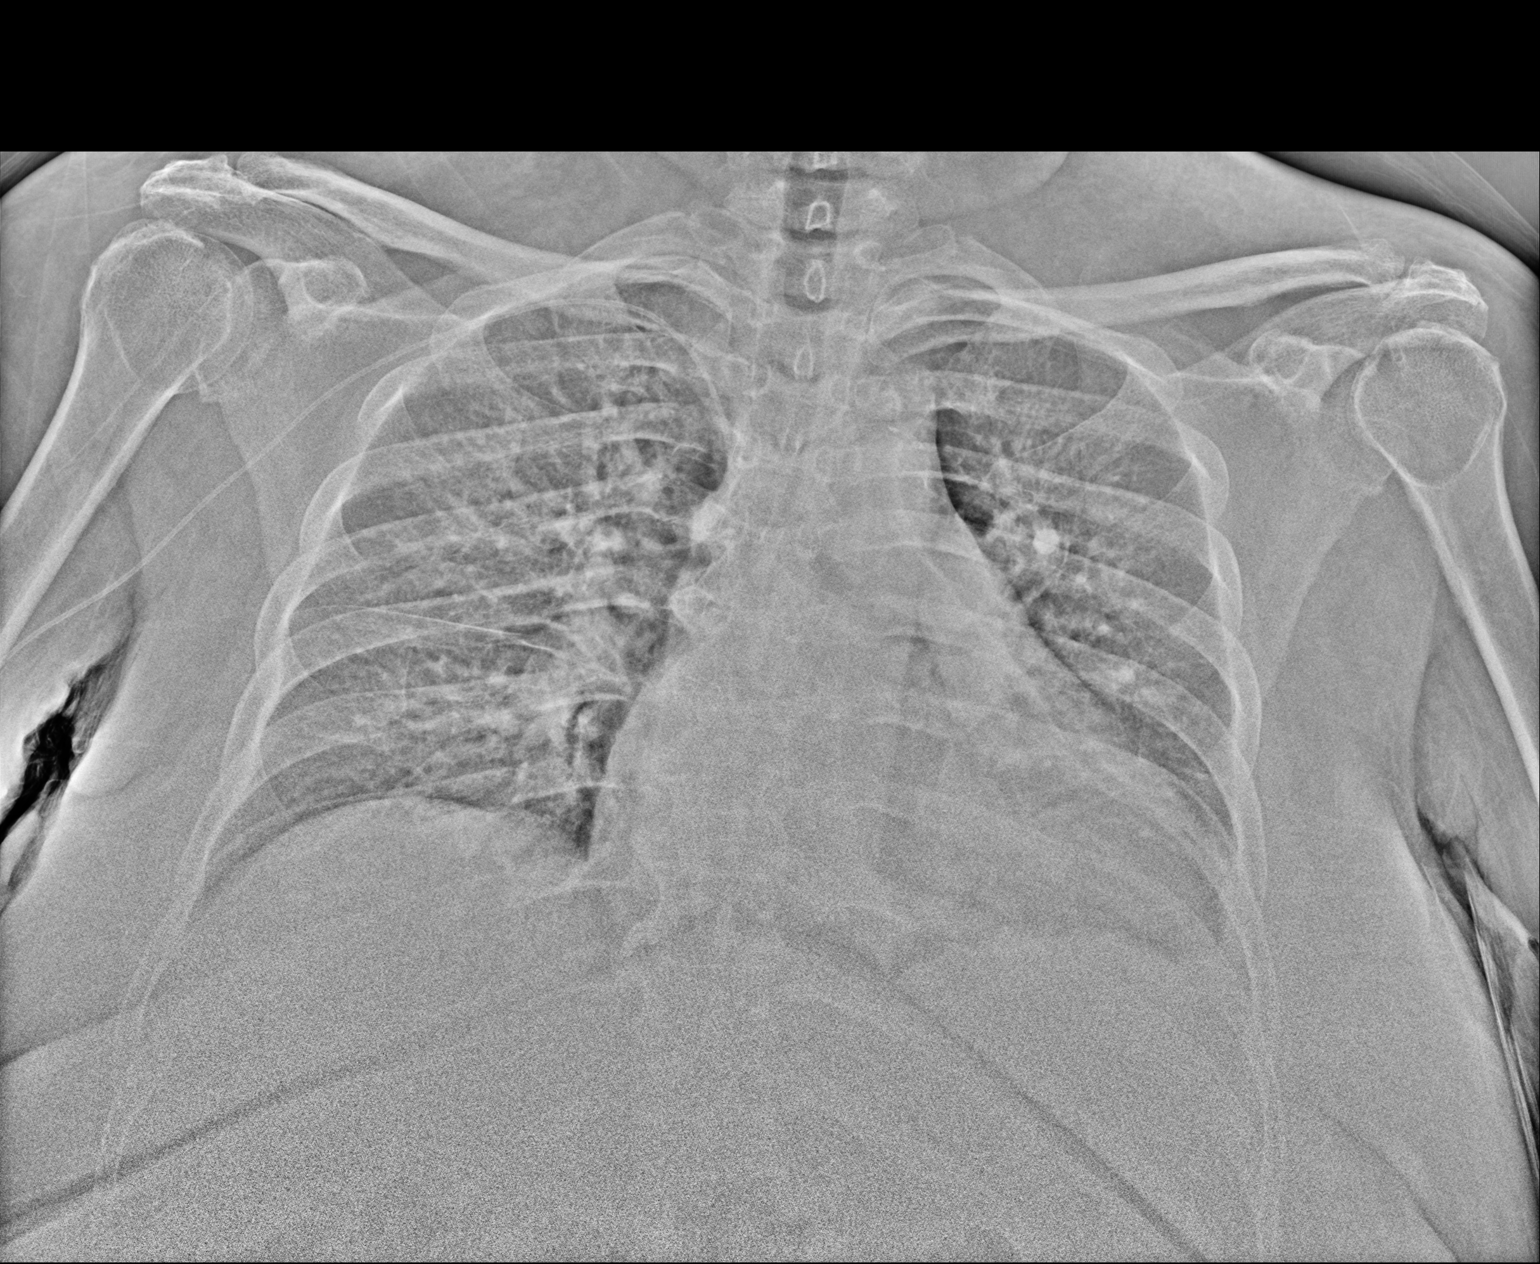
[im 2/2]
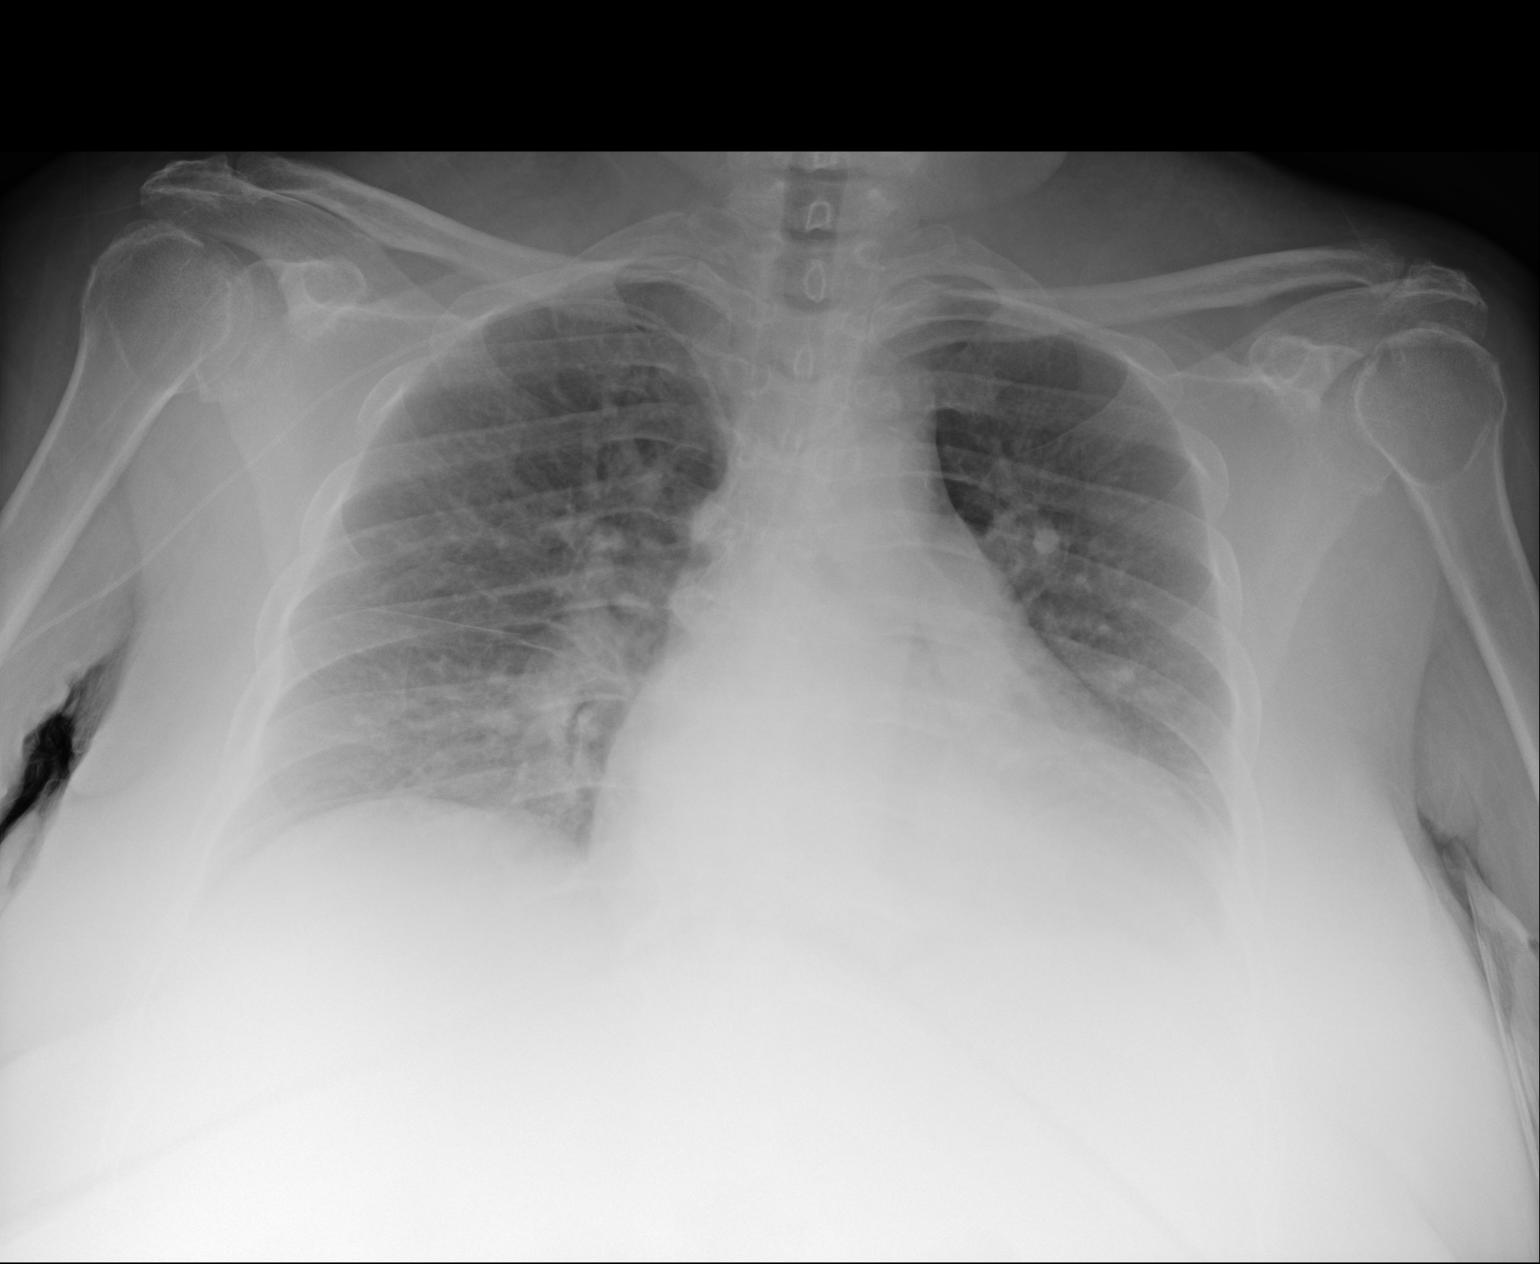

[2 of 2 positions shown; findings below may reference images not displayed]

FINDINGS: Tip of RIGHT arm PICC line projects over proximal SVC.

Enlargement of cardiac silhouette with pulmonary vascular
congestion.

Mediastinal contour stable.

Minimal RIGHT basilar atelectasis.

Persistent atelectasis versus infiltrate LEFT lower lobe.

No pneumothorax.
IMPRESSION: Tip of RIGHT arm PICC line projects over proximal SVC.

Enlargement of cardiac silhouette with pulmonary vascular
congestion.

Minimal RIGHT basilar atelectasis with persistent atelectasis versus
consolidation in LEFT lower lobe.
